# Patient Record
Sex: Female | Born: 1975 | Race: White | Hispanic: No | Smoking: Current every day smoker
Health system: Southern US, Community
[De-identification: ages and names within clinical notes are randomized; demographics above are authoritative.]

## PROBLEM LIST (undated history)

## (undated) DIAGNOSIS — T8859XA Other complications of anesthesia, initial encounter: Secondary | ICD-10-CM

## (undated) DIAGNOSIS — B9789 Other viral agents as the cause of diseases classified elsewhere: Secondary | ICD-10-CM

## (undated) DIAGNOSIS — E669 Obesity, unspecified: Secondary | ICD-10-CM

## (undated) DIAGNOSIS — E079 Disorder of thyroid, unspecified: Secondary | ICD-10-CM

## (undated) DIAGNOSIS — F419 Anxiety disorder, unspecified: Secondary | ICD-10-CM

## (undated) DIAGNOSIS — G35 Multiple sclerosis: Secondary | ICD-10-CM

## (undated) DIAGNOSIS — T4145XA Adverse effect of unspecified anesthetic, initial encounter: Secondary | ICD-10-CM

## (undated) DIAGNOSIS — K219 Gastro-esophageal reflux disease without esophagitis: Secondary | ICD-10-CM

## (undated) DIAGNOSIS — G0481 Other encephalitis and encephalomyelitis: Secondary | ICD-10-CM

## (undated) DIAGNOSIS — M797 Fibromyalgia: Secondary | ICD-10-CM

## (undated) DIAGNOSIS — Z87442 Personal history of urinary calculi: Secondary | ICD-10-CM

## (undated) DIAGNOSIS — K5903 Drug induced constipation: Secondary | ICD-10-CM

## (undated) DIAGNOSIS — S92901A Unspecified fracture of right foot, initial encounter for closed fracture: Secondary | ICD-10-CM

## (undated) DIAGNOSIS — G4733 Obstructive sleep apnea (adult) (pediatric): Secondary | ICD-10-CM

## (undated) DIAGNOSIS — J988 Other specified respiratory disorders: Secondary | ICD-10-CM

## (undated) DIAGNOSIS — T402X5A Adverse effect of other opioids, initial encounter: Secondary | ICD-10-CM

## (undated) DIAGNOSIS — M1990 Unspecified osteoarthritis, unspecified site: Secondary | ICD-10-CM

## (undated) DIAGNOSIS — M7918 Myalgia, other site: Secondary | ICD-10-CM

## (undated) DIAGNOSIS — G4730 Sleep apnea, unspecified: Secondary | ICD-10-CM

## (undated) DIAGNOSIS — I499 Cardiac arrhythmia, unspecified: Secondary | ICD-10-CM

## (undated) DIAGNOSIS — G894 Chronic pain syndrome: Secondary | ICD-10-CM

## (undated) DIAGNOSIS — M5481 Occipital neuralgia: Secondary | ICD-10-CM

## (undated) DIAGNOSIS — K259 Gastric ulcer, unspecified as acute or chronic, without hemorrhage or perforation: Secondary | ICD-10-CM

## (undated) HISTORY — DX: Chronic pain syndrome: G89.4

## (undated) HISTORY — PX: ENDOMETRIAL ABLATION: SHX621

## (undated) HISTORY — DX: Obstructive sleep apnea (adult) (pediatric): G47.33

## (undated) HISTORY — DX: Anxiety disorder, unspecified: F41.9

## (undated) HISTORY — DX: Sleep apnea, unspecified: G47.30

## (undated) HISTORY — DX: Myalgia, other site: M79.18

## (undated) HISTORY — PX: COLONOSCOPY: SHX174

## (undated) HISTORY — DX: Gastric ulcer, unspecified as acute or chronic, without hemorrhage or perforation: K25.9

## (undated) HISTORY — DX: Fibromyalgia: M79.7

## (undated) HISTORY — PX: TUBAL LIGATION: SHX77

## (undated) HISTORY — PX: NASAL SEPTUM SURGERY: SHX37

## (undated) HISTORY — DX: Occipital neuralgia: M54.81

## (undated) HISTORY — DX: Gastro-esophageal reflux disease without esophagitis: K21.9

## (undated) HISTORY — PX: WISDOM TOOTH EXTRACTION: SHX21

---

## 1998-01-17 ENCOUNTER — Other Ambulatory Visit: Admission: RE | Admit: 1998-01-17 | Discharge: 1998-01-17 | Payer: Self-pay | Admitting: Obstetrics

## 1999-06-23 ENCOUNTER — Encounter: Payer: Self-pay | Admitting: Orthopedic Surgery

## 1999-06-23 ENCOUNTER — Ambulatory Visit (HOSPITAL_COMMUNITY): Admission: RE | Admit: 1999-06-23 | Discharge: 1999-06-23 | Payer: Self-pay | Admitting: Orthopedic Surgery

## 2001-01-12 ENCOUNTER — Encounter: Payer: Self-pay | Admitting: Emergency Medicine

## 2001-01-12 ENCOUNTER — Emergency Department (HOSPITAL_COMMUNITY): Admission: EM | Admit: 2001-01-12 | Discharge: 2001-01-12 | Payer: Self-pay | Admitting: Emergency Medicine

## 2001-04-08 ENCOUNTER — Emergency Department (HOSPITAL_COMMUNITY): Admission: EM | Admit: 2001-04-08 | Discharge: 2001-04-08 | Payer: Self-pay | Admitting: Emergency Medicine

## 2002-06-05 ENCOUNTER — Encounter: Payer: Self-pay | Admitting: Emergency Medicine

## 2002-06-05 ENCOUNTER — Emergency Department (HOSPITAL_COMMUNITY): Admission: EM | Admit: 2002-06-05 | Discharge: 2002-06-05 | Payer: Self-pay | Admitting: Emergency Medicine

## 2003-08-09 ENCOUNTER — Emergency Department (HOSPITAL_COMMUNITY): Admission: EM | Admit: 2003-08-09 | Discharge: 2003-08-09 | Payer: Self-pay

## 2004-04-20 ENCOUNTER — Emergency Department (HOSPITAL_COMMUNITY): Admission: EM | Admit: 2004-04-20 | Discharge: 2004-04-20 | Payer: Self-pay | Admitting: Emergency Medicine

## 2004-04-21 ENCOUNTER — Emergency Department (HOSPITAL_COMMUNITY): Admission: EM | Admit: 2004-04-21 | Discharge: 2004-04-22 | Payer: Self-pay | Admitting: Emergency Medicine

## 2004-05-08 ENCOUNTER — Emergency Department (HOSPITAL_COMMUNITY): Admission: EM | Admit: 2004-05-08 | Discharge: 2004-05-08 | Payer: Self-pay | Admitting: Emergency Medicine

## 2004-05-11 ENCOUNTER — Emergency Department (HOSPITAL_COMMUNITY): Admission: EM | Admit: 2004-05-11 | Discharge: 2004-05-11 | Payer: Self-pay | Admitting: Emergency Medicine

## 2005-12-13 ENCOUNTER — Emergency Department (HOSPITAL_COMMUNITY): Admission: EM | Admit: 2005-12-13 | Discharge: 2005-12-13 | Payer: Self-pay | Admitting: Emergency Medicine

## 2006-05-04 ENCOUNTER — Emergency Department (HOSPITAL_COMMUNITY): Admission: EM | Admit: 2006-05-04 | Discharge: 2006-05-04 | Payer: Self-pay | Admitting: Emergency Medicine

## 2006-05-28 ENCOUNTER — Emergency Department (HOSPITAL_COMMUNITY): Admission: EM | Admit: 2006-05-28 | Discharge: 2006-05-28 | Payer: Self-pay | Admitting: Emergency Medicine

## 2007-01-13 ENCOUNTER — Emergency Department (HOSPITAL_COMMUNITY): Admission: EM | Admit: 2007-01-13 | Discharge: 2007-01-13 | Payer: Self-pay | Admitting: Emergency Medicine

## 2007-02-12 ENCOUNTER — Emergency Department (HOSPITAL_COMMUNITY): Admission: EM | Admit: 2007-02-12 | Discharge: 2007-02-12 | Payer: Self-pay | Admitting: Emergency Medicine

## 2007-08-30 ENCOUNTER — Emergency Department (HOSPITAL_COMMUNITY): Admission: EM | Admit: 2007-08-30 | Discharge: 2007-08-30 | Payer: Self-pay | Admitting: Emergency Medicine

## 2008-02-12 ENCOUNTER — Emergency Department (HOSPITAL_COMMUNITY): Admission: EM | Admit: 2008-02-12 | Discharge: 2008-02-12 | Payer: Self-pay | Admitting: Emergency Medicine

## 2008-06-24 ENCOUNTER — Emergency Department (HOSPITAL_COMMUNITY): Admission: EM | Admit: 2008-06-24 | Discharge: 2008-06-24 | Payer: Self-pay | Admitting: Emergency Medicine

## 2008-07-19 ENCOUNTER — Emergency Department (HOSPITAL_COMMUNITY): Admission: EM | Admit: 2008-07-19 | Discharge: 2008-07-19 | Payer: Self-pay | Admitting: Emergency Medicine

## 2008-12-13 ENCOUNTER — Emergency Department (HOSPITAL_COMMUNITY): Admission: EM | Admit: 2008-12-13 | Discharge: 2008-12-13 | Payer: Self-pay | Admitting: Emergency Medicine

## 2009-04-03 ENCOUNTER — Inpatient Hospital Stay (HOSPITAL_COMMUNITY): Admission: AD | Admit: 2009-04-03 | Discharge: 2009-04-03 | Payer: Self-pay | Admitting: Obstetrics & Gynecology

## 2009-04-13 ENCOUNTER — Inpatient Hospital Stay (HOSPITAL_COMMUNITY): Admission: AD | Admit: 2009-04-13 | Discharge: 2009-04-13 | Payer: Self-pay | Admitting: Obstetrics & Gynecology

## 2009-04-28 ENCOUNTER — Emergency Department (HOSPITAL_COMMUNITY): Admission: EM | Admit: 2009-04-28 | Discharge: 2009-04-28 | Payer: Self-pay | Admitting: Emergency Medicine

## 2009-05-20 ENCOUNTER — Other Ambulatory Visit: Payer: Self-pay | Admitting: Obstetrics and Gynecology

## 2009-05-21 ENCOUNTER — Other Ambulatory Visit: Payer: Self-pay | Admitting: Obstetrics and Gynecology

## 2009-05-21 ENCOUNTER — Observation Stay (HOSPITAL_COMMUNITY): Admission: EM | Admit: 2009-05-21 | Discharge: 2009-05-21 | Payer: Self-pay | Admitting: Emergency Medicine

## 2009-10-11 ENCOUNTER — Inpatient Hospital Stay (HOSPITAL_COMMUNITY): Admission: AD | Admit: 2009-10-11 | Discharge: 2009-10-11 | Payer: Self-pay | Admitting: Obstetrics & Gynecology

## 2009-10-11 ENCOUNTER — Ambulatory Visit: Payer: Self-pay | Admitting: Physician Assistant

## 2010-02-25 ENCOUNTER — Emergency Department (HOSPITAL_COMMUNITY)
Admission: EM | Admit: 2010-02-25 | Discharge: 2010-02-25 | Payer: Self-pay | Source: Home / Self Care | Admitting: Emergency Medicine

## 2010-05-04 ENCOUNTER — Emergency Department (HOSPITAL_COMMUNITY)
Admission: EM | Admit: 2010-05-04 | Discharge: 2010-05-04 | Payer: Self-pay | Source: Home / Self Care | Admitting: Emergency Medicine

## 2010-07-13 LAB — DIFFERENTIAL
Lymphocytes Relative: 23 % (ref 12–46)
Lymphs Abs: 1.7 10*3/uL (ref 0.7–4.0)
Monocytes Absolute: 0.8 10*3/uL (ref 0.1–1.0)
Monocytes Relative: 10 % (ref 3–12)
Neutro Abs: 4.5 10*3/uL (ref 1.7–7.7)
Neutrophils Relative %: 61 % (ref 43–77)

## 2010-07-13 LAB — URINALYSIS, ROUTINE W REFLEX MICROSCOPIC
Glucose, UA: NEGATIVE mg/dL
Hgb urine dipstick: NEGATIVE
Nitrite: NEGATIVE
pH: 7 (ref 5.0–8.0)

## 2010-07-13 LAB — POCT CARDIAC MARKERS
CKMB, poc: 2.5 ng/mL (ref 1.0–8.0)
Myoglobin, poc: 57 ng/mL (ref 12–200)
Myoglobin, poc: 70 ng/mL (ref 12–200)
Troponin i, poc: <0.05 ng/mL (ref 0.00–0.09)
Troponin i, poc: <0.05 ng/mL (ref 0.00–0.09)

## 2010-07-13 LAB — RAPID URINE DRUG SCREEN, HOSP PERFORMED
Amphetamines: NOT DETECTED
Cocaine: NOT DETECTED
Opiates: POSITIVE — AB
Tetrahydrocannabinol: NOT DETECTED

## 2010-07-13 LAB — POCT I-STAT, CHEM 8
BUN: 13 mg/dL (ref 6–23)
Chloride: 105 mEq/L (ref 96–112)
Creatinine, Ser: 0.8 mg/dL (ref 0.4–1.2)
Potassium: 4.1 mEq/L (ref 3.5–5.1)
Sodium: 140 mEq/L (ref 135–145)

## 2010-07-13 LAB — CBC
MCH: 27.1 pg (ref 26.0–34.0)
MCV: 83.8 fL (ref 78.0–100.0)
RBC: 4.58 MIL/uL (ref 3.87–5.11)

## 2010-07-13 LAB — POCT PREGNANCY, URINE: Preg Test, Ur: NEGATIVE

## 2010-07-13 LAB — URINE MICROSCOPIC-ADD ON

## 2010-07-19 LAB — BASIC METABOLIC PANEL
BUN: 3 mg/dL — ABNORMAL LOW (ref 6–23)
Calcium: 8.9 mg/dL (ref 8.4–10.5)
Chloride: 105 mEq/L (ref 96–112)
Creatinine, Ser: 0.51 mg/dL (ref 0.4–1.2)

## 2010-07-19 LAB — CBC
MCV: 87.5 fL (ref 78.0–100.0)
Platelets: 240 10*3/uL (ref 150–400)
RDW: 12.9 % (ref 11.5–15.5)

## 2010-07-19 LAB — DIFFERENTIAL
Eosinophils Absolute: 0 10*3/uL (ref 0.0–0.7)
Monocytes Relative: 4 % (ref 3–12)
Neutrophils Relative %: 90 % — ABNORMAL HIGH (ref 43–77)

## 2010-07-19 IMAGING — CR DG CHEST 2V
2 series · 2 of 2 positions shown · non-contrast
Comparison: 06/24/2008

CLINICAL DATA: Shortness of breath.

CHEST - 2 VIEW

[w chest pa]
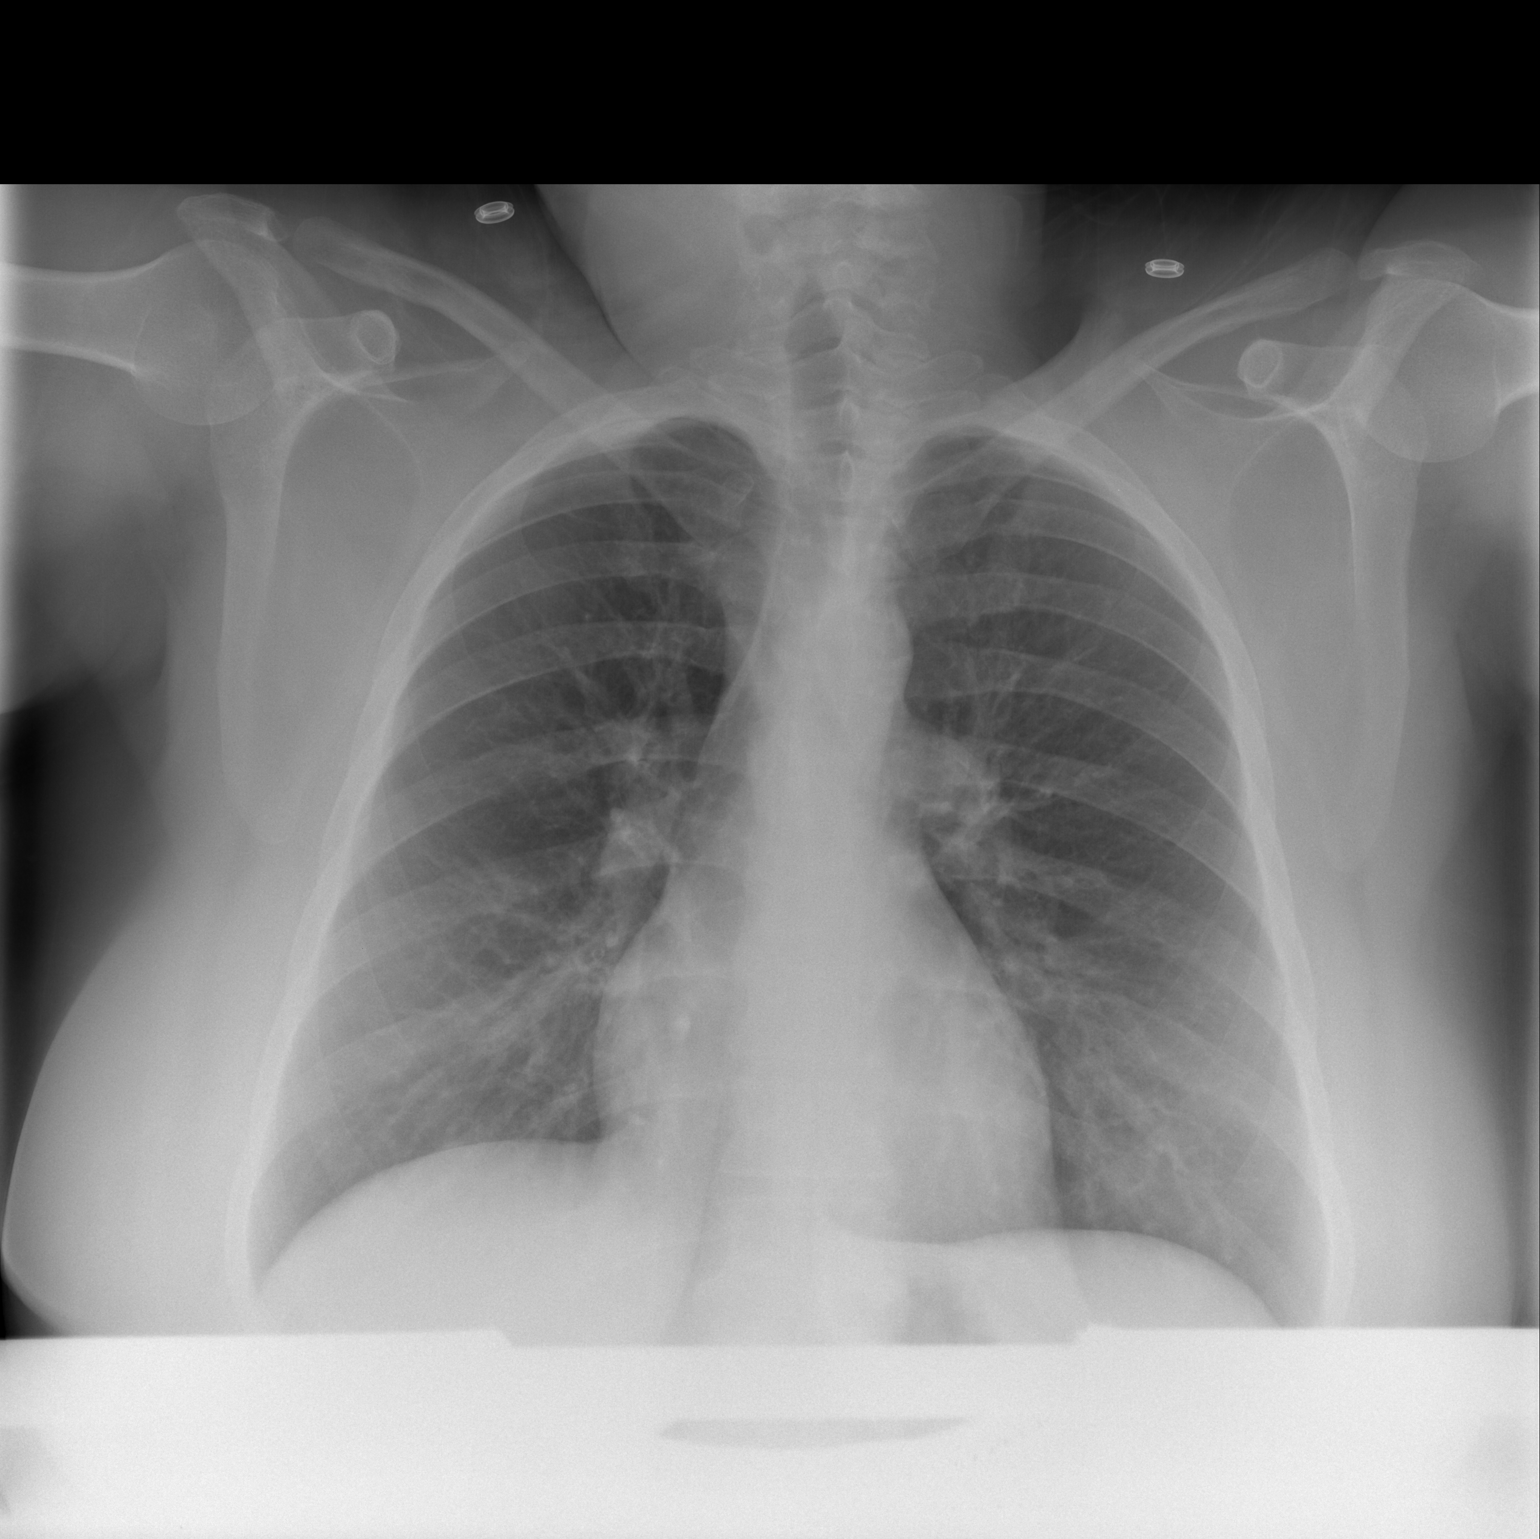

[w chest lat]
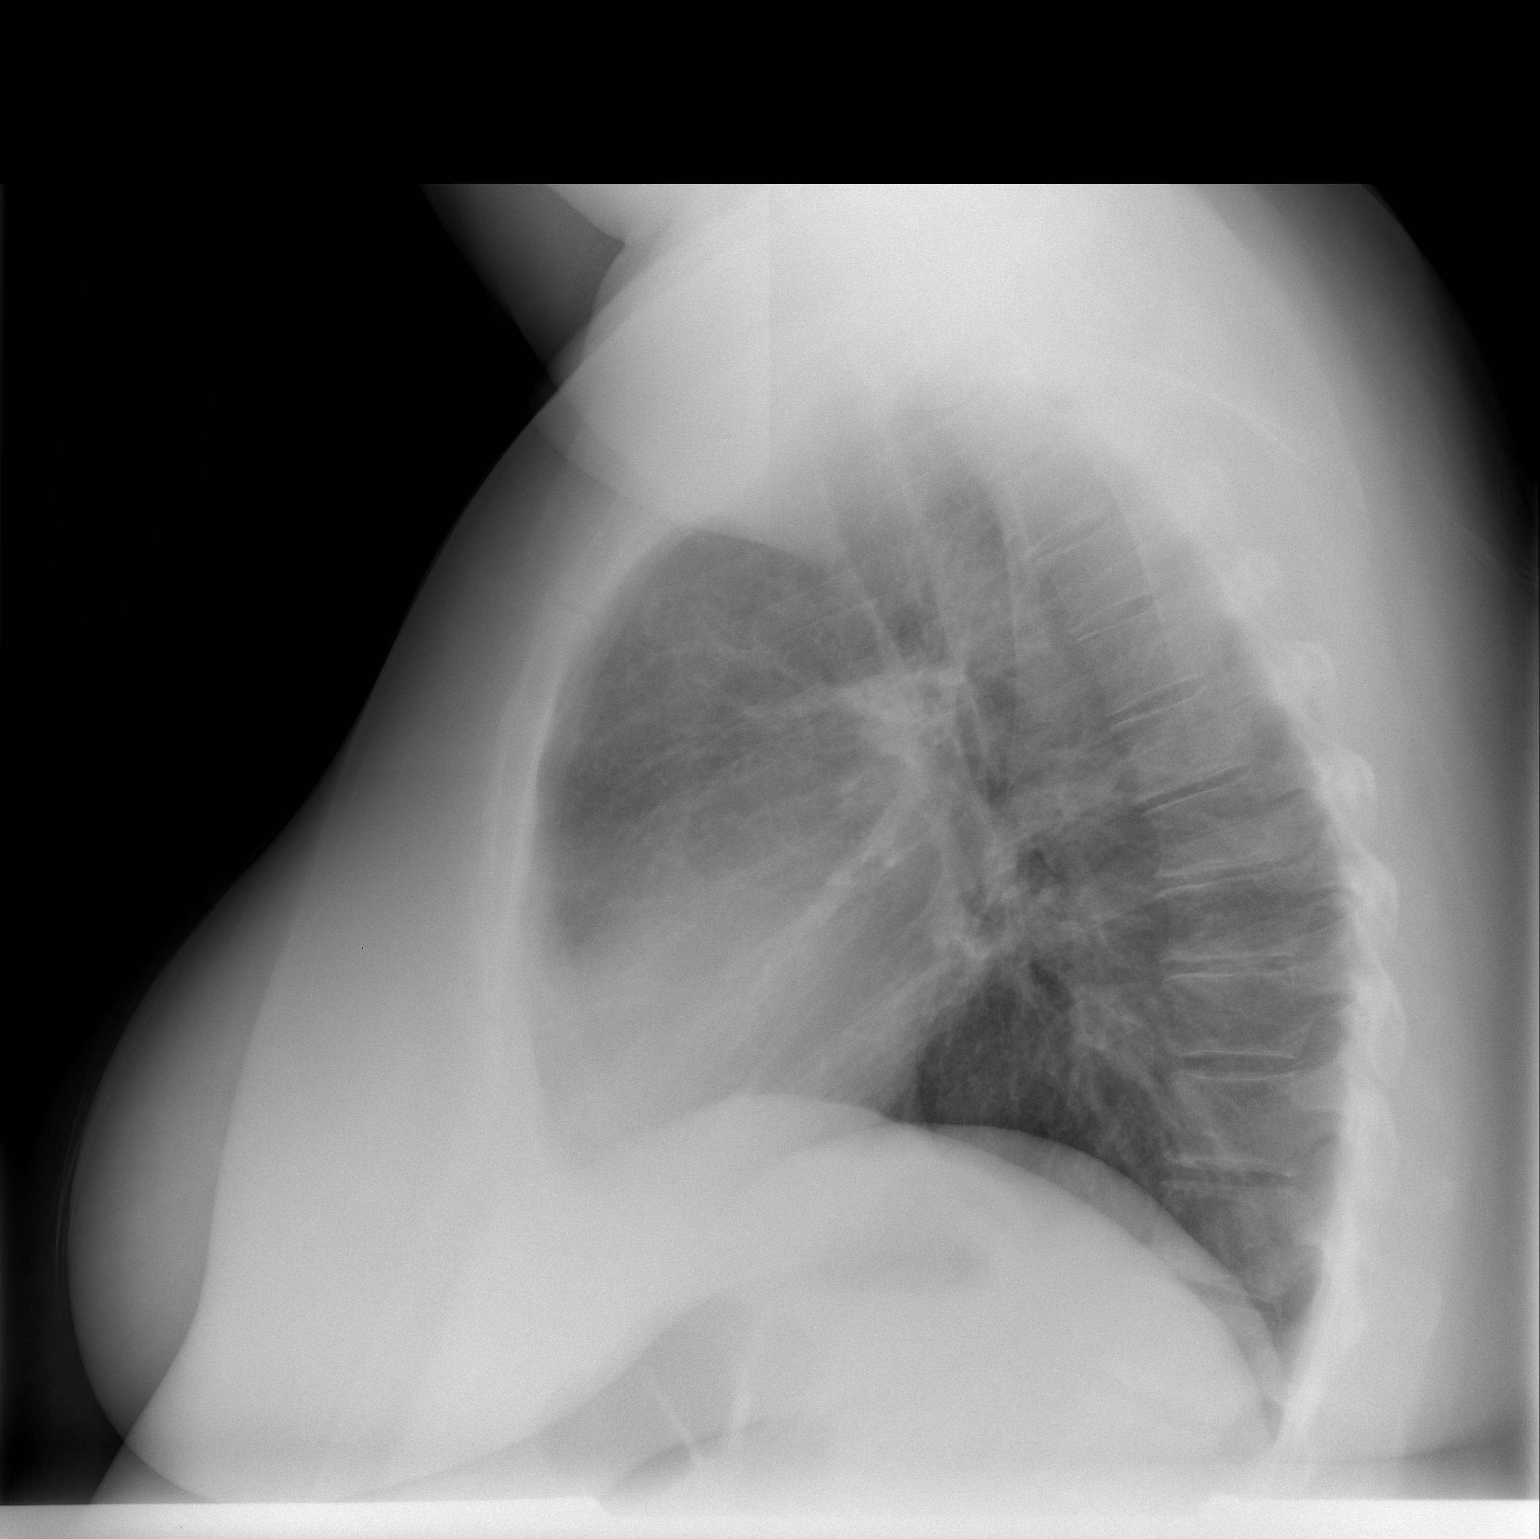

[2 of 2 positions shown; findings below may reference images not displayed]

FINDINGS: Heart and mediastinal contours are within normal limits.
No focal opacities or effusions.  No acute bony abnormality.
IMPRESSION: No acute findings.

## 2010-07-20 LAB — URINALYSIS, ROUTINE W REFLEX MICROSCOPIC
Bilirubin Urine: NEGATIVE
Hgb urine dipstick: NEGATIVE
Ketones, ur: >80 mg/dL — AB
Nitrite: NEGATIVE
Specific Gravity, Urine: >1.03 — ABNORMAL HIGH (ref 1.005–1.030)

## 2010-07-20 LAB — GC/CHLAMYDIA PROBE AMP, GENITAL
Chlamydia, DNA Probe: NEGATIVE
Chlamydia, DNA Probe: NEGATIVE
GC Probe Amp, Genital: NEGATIVE
GC Probe Amp, Genital: NEGATIVE

## 2010-07-20 LAB — WET PREP, GENITAL
Trich, Wet Prep: NONE SEEN
Yeast Wet Prep HPF POC: NONE SEEN

## 2010-07-20 LAB — CBC
Hemoglobin: 11.4 g/dL — ABNORMAL LOW (ref 12.0–15.0)
MCV: 87.9 fL (ref 78.0–100.0)
Platelets: 242 10*3/uL (ref 150–400)

## 2010-08-01 ENCOUNTER — Inpatient Hospital Stay (INDEPENDENT_AMBULATORY_CARE_PROVIDER_SITE_OTHER)
Admission: RE | Admit: 2010-08-01 | Discharge: 2010-08-01 | Disposition: A | Discharge status: Home / Self Care | Payer: Self-pay | Source: Ambulatory Visit | Attending: Family Medicine | Admitting: Family Medicine

## 2010-08-01 DIAGNOSIS — K089 Disorder of teeth and supporting structures, unspecified: Secondary | ICD-10-CM

## 2010-08-04 LAB — CBC
Hemoglobin: 13.6 g/dL (ref 12.0–15.0)
MCHC: 33.9 g/dL (ref 30.0–36.0)
Platelets: 346 10*3/uL (ref 150–400)
RBC: 4.51 MIL/uL (ref 3.87–5.11)
RDW: 13.5 % (ref 11.5–15.5)
WBC: 9.3 10*3/uL (ref 4.0–10.5)
WBC: 9.6 10*3/uL (ref 4.0–10.5)

## 2010-08-04 LAB — COMPREHENSIVE METABOLIC PANEL
ALT: 15 U/L (ref 0–35)
AST: 13 U/L (ref 0–37)
Alkaline Phosphatase: 62 U/L (ref 39–117)
CO2: 25 mEq/L (ref 19–32)
Calcium: 8.8 mg/dL (ref 8.4–10.5)
Chloride: 104 mEq/L (ref 96–112)
GFR calc Af Amer: >60 mL/min (ref >60)
GFR calc non Af Amer: >60 mL/min (ref >60)
Glucose, Bld: 89 mg/dL (ref 70–99)
Sodium: 135 mEq/L (ref 135–145)
Total Bilirubin: 0.4 mg/dL (ref 0.3–1.2)

## 2010-08-04 LAB — URINALYSIS, ROUTINE W REFLEX MICROSCOPIC
Bilirubin Urine: NEGATIVE
Hgb urine dipstick: NEGATIVE
Nitrite: NEGATIVE
Nitrite: NEGATIVE
Specific Gravity, Urine: 1.015 (ref 1.005–1.030)
Specific Gravity, Urine: 1.015 (ref 1.005–1.030)
Urobilinogen, UA: 0.2 mg/dL (ref 0.0–1.0)
pH: 7.5 (ref 5.0–8.0)

## 2010-08-04 LAB — GC/CHLAMYDIA PROBE AMP, GENITAL
Chlamydia, DNA Probe: NEGATIVE
GC Probe Amp, Genital: NEGATIVE

## 2010-08-04 LAB — WET PREP, GENITAL

## 2010-08-04 LAB — POCT PREGNANCY, URINE: Preg Test, Ur: POSITIVE

## 2010-08-04 LAB — HCG, QUANTITATIVE, PREGNANCY: hCG, Beta Chain, Quant, S: 4980 m[IU]/mL — ABNORMAL HIGH (ref <5)

## 2010-08-08 LAB — URINALYSIS, ROUTINE W REFLEX MICROSCOPIC
Bilirubin Urine: NEGATIVE
Ketones, ur: NEGATIVE mg/dL
Nitrite: NEGATIVE
Protein, ur: NEGATIVE mg/dL
Urobilinogen, UA: 0.2 mg/dL (ref 0.0–1.0)
pH: 6 (ref 5.0–8.0)

## 2010-08-08 LAB — URINE MICROSCOPIC-ADD ON

## 2010-08-13 LAB — CBC
HCT: 42.8 % (ref 36.0–46.0)
Hemoglobin: 14.6 g/dL (ref 12.0–15.0)
MCHC: 34.1 g/dL (ref 30.0–36.0)
MCV: 85.2 fL (ref 78.0–100.0)
RBC: 5.03 MIL/uL (ref 3.87–5.11)
WBC: 5.8 10*3/uL (ref 4.0–10.5)

## 2010-08-13 LAB — COMPREHENSIVE METABOLIC PANEL
AST: 20 U/L (ref 0–37)
CO2: 26 mEq/L (ref 19–32)
Calcium: 8.4 mg/dL (ref 8.4–10.5)
Chloride: 105 mEq/L (ref 96–112)
Creatinine, Ser: 0.64 mg/dL (ref 0.4–1.2)
GFR calc Af Amer: >60 mL/min (ref >60)
GFR calc non Af Amer: >60 mL/min (ref >60)
Glucose, Bld: 108 mg/dL — ABNORMAL HIGH (ref 70–99)
Total Bilirubin: 0.8 mg/dL (ref 0.3–1.2)

## 2010-08-13 LAB — PREGNANCY, URINE: Preg Test, Ur: NEGATIVE

## 2010-08-13 LAB — URINALYSIS, ROUTINE W REFLEX MICROSCOPIC
Bilirubin Urine: NEGATIVE
Hgb urine dipstick: NEGATIVE
Nitrite: NEGATIVE
Protein, ur: NEGATIVE mg/dL
Urobilinogen, UA: 1 mg/dL (ref 0.0–1.0)

## 2010-08-13 LAB — DIFFERENTIAL
Basophils Absolute: 0 10*3/uL (ref 0.0–0.1)
Eosinophils Absolute: 0.1 10*3/uL (ref 0.0–0.7)
Eosinophils Relative: 1 % (ref 0–5)
Lymphocytes Relative: 12 % (ref 12–46)
Neutrophils Relative %: 78 % — ABNORMAL HIGH (ref 43–77)

## 2010-08-13 LAB — LIPASE, BLOOD: Lipase: 21 U/L (ref 11–59)

## 2010-08-18 LAB — URINE CULTURE: Colony Count: 80000

## 2010-08-18 LAB — URINALYSIS, ROUTINE W REFLEX MICROSCOPIC
Bilirubin Urine: NEGATIVE
Glucose, UA: NEGATIVE mg/dL
Hgb urine dipstick: NEGATIVE
Ketones, ur: NEGATIVE mg/dL
Protein, ur: NEGATIVE mg/dL

## 2010-08-18 LAB — URINE MICROSCOPIC-ADD ON

## 2010-08-24 ENCOUNTER — Ambulatory Visit (HOSPITAL_COMMUNITY)
Admission: RE | Admit: 2010-08-24 | Discharge: 2010-08-24 | Disposition: A | Discharge status: Home / Self Care | Payer: Medicaid Other | Source: Ambulatory Visit | Attending: Family | Admitting: Family

## 2010-08-24 ENCOUNTER — Other Ambulatory Visit (HOSPITAL_COMMUNITY): Payer: Self-pay | Admitting: Family

## 2010-08-24 DIAGNOSIS — M549 Dorsalgia, unspecified: Secondary | ICD-10-CM

## 2010-08-24 DIAGNOSIS — M545 Low back pain, unspecified: Secondary | ICD-10-CM | POA: Insufficient documentation

## 2010-08-24 DIAGNOSIS — M546 Pain in thoracic spine: Secondary | ICD-10-CM | POA: Insufficient documentation

## 2010-08-27 ENCOUNTER — Emergency Department (HOSPITAL_COMMUNITY)
Admission: EM | Admit: 2010-08-27 | Discharge: 2010-08-28 | Disposition: A | Discharge status: Home / Self Care | Payer: Medicaid Other | Attending: Emergency Medicine | Admitting: Emergency Medicine

## 2010-08-27 DIAGNOSIS — G43909 Migraine, unspecified, not intractable, without status migrainosus: Secondary | ICD-10-CM | POA: Insufficient documentation

## 2010-09-16 ENCOUNTER — Ambulatory Visit: Payer: Medicaid Other | Attending: Sports Medicine | Admitting: Rehabilitation

## 2010-09-16 DIAGNOSIS — IMO0001 Reserved for inherently not codable concepts without codable children: Secondary | ICD-10-CM | POA: Insufficient documentation

## 2010-09-16 DIAGNOSIS — M545 Low back pain, unspecified: Secondary | ICD-10-CM | POA: Insufficient documentation

## 2010-09-16 DIAGNOSIS — M2569 Stiffness of other specified joint, not elsewhere classified: Secondary | ICD-10-CM | POA: Insufficient documentation

## 2010-09-23 ENCOUNTER — Ambulatory Visit: Payer: Medicaid Other | Admitting: Physical Therapy

## 2010-09-30 ENCOUNTER — Ambulatory Visit: Payer: Medicaid Other | Admitting: Physical Therapy

## 2011-02-11 LAB — URINE MICROSCOPIC-ADD ON

## 2011-02-11 LAB — URINALYSIS, ROUTINE W REFLEX MICROSCOPIC
Bilirubin Urine: NEGATIVE
Glucose, UA: NEGATIVE
Nitrite: NEGATIVE
Specific Gravity, Urine: 1.021
pH: 6

## 2011-02-11 LAB — POCT PREGNANCY, URINE: Preg Test, Ur: NEGATIVE

## 2011-02-11 LAB — I-STAT 8, (EC8 V) (CONVERTED LAB)
BUN: 7
Chloride: 107
HCT: 43
Operator id: 265201
pCO2, Ven: 39.6 — ABNORMAL LOW

## 2011-02-12 LAB — URINE MICROSCOPIC-ADD ON

## 2011-02-12 LAB — URINALYSIS, ROUTINE W REFLEX MICROSCOPIC
Bilirubin Urine: NEGATIVE
Ketones, ur: 15 — AB
Nitrite: NEGATIVE
Protein, ur: NEGATIVE
Specific Gravity, Urine: 1.031 — ABNORMAL HIGH
Urobilinogen, UA: 1

## 2011-02-12 LAB — POCT PREGNANCY, URINE
Operator id: 277751
Preg Test, Ur: NEGATIVE

## 2011-03-17 ENCOUNTER — Emergency Department (HOSPITAL_COMMUNITY)
Admission: EM | Admit: 2011-03-17 | Discharge: 2011-03-17 | Disposition: A | Discharge status: Home / Self Care | Payer: Medicaid Other | Source: Home / Self Care

## 2011-03-17 DIAGNOSIS — J45909 Unspecified asthma, uncomplicated: Secondary | ICD-10-CM

## 2011-03-17 DIAGNOSIS — J069 Acute upper respiratory infection, unspecified: Secondary | ICD-10-CM

## 2011-03-17 MED ORDER — PREDNISONE 10 MG PO TABS: ORAL_TABLET | ORAL | Status: AC

## 2011-03-17 MED ORDER — ALBUTEROL SULFATE (5 MG/ML) 0.5% IN NEBU
5.0000 mg | INHALATION_SOLUTION | Freq: Once | RESPIRATORY_TRACT | Status: AC
  Administered 2011-03-17: 5 mg via RESPIRATORY_TRACT

## 2011-03-17 MED ORDER — ALBUTEROL SULFATE (5 MG/ML) 0.5% IN NEBU
INHALATION_SOLUTION | RESPIRATORY_TRACT | Status: AC
  Filled 2011-03-17: qty 1

## 2011-03-17 MED ORDER — AZITHROMYCIN 250 MG PO TABS: ORAL_TABLET | ORAL | Status: DC

## 2011-03-17 MED ORDER — IPRATROPIUM BROMIDE 0.02 % IN SOLN
0.5000 mg | Freq: Once | RESPIRATORY_TRACT | Status: AC
  Administered 2011-03-17: 0.5 mg via RESPIRATORY_TRACT

## 2011-03-17 MED ORDER — PROMETHAZINE-CODEINE 6.25-10 MG/5ML PO SYRP: ORAL_SOLUTION | ORAL | Status: AC

## 2011-03-17 NOTE — ED Notes (Signed)
Chest tight, wheezing since last week , wheezing not controlled w home treatments; wheezing ascultated bilateral

## 2011-03-17 NOTE — Discharge Instructions (Signed)
Continue albuterol nebulizer treatments or inhaler every 3 hours. Also use ipratropium treatment four times a day until symptoms improve. Increase fluids and rest. Stop smoking. If your symptoms worsen go to the ER.

## 2011-03-17 NOTE — ED Provider Notes (Signed)
History     CSN: [id number] Arrival date & time: 03/17/2011  7:04 PM   None     Chief Complaint  Patient presents with  . Asthma    (Consider location/radiation/quality/duration/timing/severity/associated sxs/prior treatment) Patient is a 35 y.o. female presenting with cough. The history is provided by the patient.  Cough This is a new problem. The current episode started more than 2 days ago. The problem occurs every few minutes. The problem has been gradually worsening. The cough is productive of purulent sputum. There has been no fever. Associated symptoms include chills, rhinorrhea (clear), shortness of breath and wheezing. Pertinent negatives include no chest pain, no ear pain and no sore throat. Associated symptoms comments: Wheezing and dyspnea onset last night. Minimal or no  relief with home albuterol NMT treatments.. She is a smoker. Her past medical history is significant for asthma.    Past Medical History  Diagnosis Date  . Asthma     Past Surgical History  Procedure Date  . Tubal ligation     History reviewed. No pertinent family history.  History  Substance Use Topics  . Smoking status: Current Everyday Smoker  . Smokeless tobacco: Not on file  . Alcohol Use: No    OB History    Grav Para Term Preterm Abortions TAB SAB Ect Mult Living                  Review of Systems  Constitutional: Positive for chills and fatigue. Negative for fever.  HENT: Positive for rhinorrhea (clear). Negative for ear pain, sore throat, postnasal drip and sinus pressure.   Respiratory: Positive for cough, chest tightness, shortness of breath and wheezing.   Cardiovascular: Negative for chest pain and palpitations.    Allergies  Review of patient's allergies indicates no known allergies.  Home Medications   Current Outpatient Rx  Name Route Sig Dispense Refill  . ALBUTEROL SULFATE HFA 108 (90 BASE) MCG/ACT IN AERS Inhalation Inhale 2 puffs into the lungs every 6 (six)  hours as needed.        BP 109/73  Pulse 74  Temp(Src) 97.9 F (36.6 C) (Oral)  Resp 20  SpO2 98%  Physical Exam  Nursing note and vitals reviewed. Constitutional: She appears well-developed and well-nourished. No distress.  HENT:  Head: Normocephalic and atraumatic.  Right Ear: Tympanic membrane, external ear and ear canal normal.  Left Ear: Tympanic membrane, external ear and ear canal normal.  Nose: Nose normal.  Mouth/Throat: Uvula is midline, oropharynx is clear and moist and mucous membranes are normal. No oropharyngeal exudate, posterior oropharyngeal edema or posterior oropharyngeal erythema.  Neck: Neck supple.  Cardiovascular: Normal rate, regular rhythm and normal heart sounds.   Pulmonary/Chest: Effort normal. No respiratory distress. She has wheezes. She has no rhonchi. She has no rales.  Lymphadenopathy:    She has no cervical adenopathy.  Neurological: She is alert.  Skin: Skin is warm and dry.  Psychiatric: She has a normal mood and affect.    ED Course  Procedures (including critical care time)  Labs Reviewed - No data to display No results found.   No diagnosis found.    MDM   Symptomatic improvement and improved breath sounds with NMT. Pulse ox improved also from 98% to 100%.        Melody Comas, [place] 03/18/11 1131

## 2011-03-18 NOTE — ED Provider Notes (Signed)
Medical screening examination/treatment/procedure(s) were performed by non-physician practitioner and as supervising physician I was immediately available for consultation/collaboration.   MORENO-COLL,Clarabelle Oscarson; MD   Yoav Okane Moreno-Coll, MD 03/18/11 1441 

## 2011-09-09 ENCOUNTER — Emergency Department (HOSPITAL_COMMUNITY)
Admission: EM | Admit: 2011-09-09 | Discharge: 2011-09-10 | Disposition: A | Discharge status: Home / Self Care | Payer: Medicaid Other | Attending: Emergency Medicine | Admitting: Emergency Medicine

## 2011-09-09 ENCOUNTER — Encounter (HOSPITAL_COMMUNITY): Payer: Self-pay | Admitting: Emergency Medicine

## 2011-09-09 DIAGNOSIS — R002 Palpitations: Secondary | ICD-10-CM | POA: Insufficient documentation

## 2011-09-09 DIAGNOSIS — F17200 Nicotine dependence, unspecified, uncomplicated: Secondary | ICD-10-CM | POA: Insufficient documentation

## 2011-09-09 DIAGNOSIS — J45909 Unspecified asthma, uncomplicated: Secondary | ICD-10-CM | POA: Insufficient documentation

## 2011-09-09 DIAGNOSIS — R42 Dizziness and giddiness: Secondary | ICD-10-CM | POA: Insufficient documentation

## 2011-09-09 HISTORY — DX: Obesity, unspecified: E66.9

## 2011-09-09 MED ORDER — SODIUM CHLORIDE 0.9 % IV BOLUS (SEPSIS)
1000.0000 mL | Freq: Once | INTRAVENOUS | Status: AC
  Administered 2011-09-10: 1000 mL via INTRAVENOUS

## 2011-09-09 NOTE — ED Notes (Signed)
PT. REPORTS PALPITATIONS WITH LEFT ARM PAIN , LIGHTHEADED AND DIZZINESS ONSET TODAY , STATES BP= 153/ 98 THIS AFTERNOON .

## 2011-09-10 ENCOUNTER — Emergency Department (HOSPITAL_COMMUNITY): Payer: Medicaid Other

## 2011-09-10 LAB — URINALYSIS, ROUTINE W REFLEX MICROSCOPIC
Bilirubin Urine: NEGATIVE
Glucose, UA: NEGATIVE mg/dL
Hgb urine dipstick: NEGATIVE
Ketones, ur: NEGATIVE mg/dL
Leukocytes, UA: NEGATIVE
Nitrite: NEGATIVE
Protein, ur: NEGATIVE mg/dL
Specific Gravity, Urine: 1.023 (ref 1.005–1.030)
Urobilinogen, UA: 0.2 mg/dL (ref 0.0–1.0)
pH: 6 (ref 5.0–8.0)

## 2011-09-10 LAB — POCT I-STAT, CHEM 8
HCT: 40 % (ref 36.0–46.0)
Hemoglobin: 13.6 g/dL (ref 12.0–15.0)
Potassium: 3.9 mEq/L (ref 3.5–5.1)
Sodium: 140 mEq/L (ref 135–145)
TCO2: 28 mmol/L (ref 0–100)

## 2011-09-10 LAB — PREGNANCY, URINE: Preg Test, Ur: NEGATIVE

## 2011-09-10 MED ORDER — KETOROLAC TROMETHAMINE 30 MG/ML IJ SOLN
30.0000 mg | Freq: Once | INTRAMUSCULAR | Status: AC
  Administered 2011-09-10: 30 mg via INTRAVENOUS
  Filled 2011-09-10: qty 1

## 2011-09-10 MED ORDER — ALBUTEROL SULFATE HFA 108 (90 BASE) MCG/ACT IN AERS
2.0000 | INHALATION_SPRAY | Freq: Four times a day (QID) | RESPIRATORY_TRACT | Status: DC
  Administered 2011-09-10: 2 via RESPIRATORY_TRACT
  Filled 2011-09-10: qty 6.7

## 2011-09-10 MED ORDER — PROMETHAZINE HCL 25 MG/ML IJ SOLN
12.5000 mg | Freq: Once | INTRAMUSCULAR | Status: AC
  Administered 2011-09-10: 12.5 mg via INTRAVENOUS
  Filled 2011-09-10: qty 1

## 2011-09-10 NOTE — ED Notes (Signed)
Pt discharged home in stable condition with family. Discharge instructions reviewed with pt, pt has no questions at this time.

## 2011-09-10 NOTE — Discharge Instructions (Signed)
Avoid caffeine. Followup with your Dr. for recheck in 3-5 days. Return to emergency room for worsening condition or new concerning symptoms.  Palpitations  A palpitation is the feeling that your heartbeat is irregular or is faster than normal. Although this is frightening, it usually is not serious. Palpitations may be caused by excesses of smoking, caffeine, or alcohol. They are also brought on by stress and anxiety. Sometimes, they are caused by heart disease. Unless otherwise noted, your caregiver did not find any signs of serious illness at this time. HOME CARE INSTRUCTIONS  To help prevent palpitations:  Drink decaffeinated coffee, tea, and soda pop. Avoid chocolate.   If you smoke or drink alcohol, quit or cut down as much as possible.   Reduce your stress or anxiety level. Biofeedback, yoga, or meditation will help you relax. Physical activity such as swimming, jogging, or walking also may be helpful.  SEEK MEDICAL CARE IF:   You continue to have a fast heartbeat.   Your palpitations occur more often.  SEEK IMMEDIATE MEDICAL CARE IF: You develop chest pain, shortness of breath, severe headache, dizziness, or fainting. Document Released: 04/16/2000 Document Revised: 04/08/2011 Document Reviewed: 06/16/2007 [facility] Patient Information 2012 [place], [place].

## 2011-09-10 NOTE — ED Provider Notes (Signed)
History     CSN: [id number]  Arrival date & time 09/09/11  2200   First MD Initiated Contact with Patient 09/09/11 2214      Chief Complaint  Patient presents with  . Palpitations    (Consider location/radiation/quality/duration/timing/severity/associated sxs/prior treatment) HPI Patient presents emergency murmur with palpitations and dizziness that started today.  She said she worked third shift last night.  States she has chronic pain and that has been causing muscular pain today.  Patient denies shortness of breath, nausea, vomiting, diarrhea, abdominal pain, visual changes, chest pain cough or fever.  Patient states that she did not try anything for her discomfort other than her normal medications.  Patient states that she has asthma and has been wheezing some as well.  Past Medical History  Diagnosis Date  . Asthma   . Migraine   . Obesity     Past Surgical History  Procedure Date  . Tubal ligation     No family history on file.  History  Substance Use Topics  . Smoking status: Current Everyday Smoker  . Smokeless tobacco: Not on file  . Alcohol Use: No    OB History    Grav Para Term Preterm Abortions TAB SAB Ect Mult Living                  Review of Systems All other systems negative except as documented in the HPI. All pertinent positives and negatives as reviewed in the HPI.  Allergies  Review of patient's allergies indicates no known allergies.  Home Medications   Current Outpatient Rx  Name Route Sig Dispense Refill  . ALBUTEROL SULFATE HFA 108 (90 BASE) MCG/ACT IN AERS Inhalation Inhale 2 puffs into the lungs every 6 (six) hours as needed. For shortness of breath.    Marland Kitchen GABAPENTIN 300 MG PO CAPS Oral Take 300-600 mg by mouth 3 (three) times daily. Take 300 MG in the morning, 300 MG in the afternoon, and 600 MG at bedtime.    . MOMETASONE FURO-FORMOTEROL FUM 100-5 MCG/ACT IN AERO Inhalation Inhale 2 puffs into the lungs daily.    .  OXYCODONE-ACETAMINOPHEN 10-325 MG PO TABS Oral Take 1 tablet by mouth 2 (two) times daily as needed. For pain.      BP 111/55  Pulse 75  Temp(Src) 98.3 F (36.8 C) (Oral)  Resp 17  SpO2 99%  LMP 08/30/2011  Physical Exam Physical Examination: General appearance - alert, well appearing, and in no distress, oriented to person, place, and time and overweight Mental status - alert, oriented to person, place, and time, normal mood, behavior, speech, dress, motor activity, and thought processes Eyes - pupils equal and reactive, extraocular eye movements intact Ears - bilateral TM's and external ear canals normal Nose - normal and patent, no erythema, discharge or polyps Mouth - mucous membranes moist, pharynx normal without lesions Chest - clear to auscultation, no wheezes, rales or rhonchi, symmetric air entry, no tachypnea, retractions or cyanosis Heart - normal rate, regular rhythm, normal S1, S2, no murmurs, rubs, clicks or gallops Abdomen - soft, nontender, nondistended, no masses or organomegaly  ED Course  Procedures (including critical care time)  Labs Reviewed  POCT I-STAT, CHEM 8 - Abnormal; Notable for the following:    Glucose, Bld 102 (*)    All other components within normal limits  PREGNANCY, URINE  URINALYSIS, ROUTINE W REFLEX MICROSCOPIC    The patient is seen ambulating in the department without difficulty.  Patient was seen hyperventilating  by the nurse on arrival.  I feel the patient's symptoms are multifactorial based on her chronic pain and anxiety.  She states she checked her blood pressure multiple times during the day. The patient was concerned that it was slightly higher than normal.She felt like her heart was pounding. No CP, or SOB. The patient has no abnormalities when I reviewed. She is given fluids and medication for her headache. MDM  MDM Reviewed: nursing note and vitals Interpretation: labs, ECG and x-ray    Date: 09/10/2011  Rate: 83  Rhythm:  normal sinus rhythm  QRS Axis: normal  Intervals: normal  ST/T Wave abnormalities: normal  Conduction Disutrbances:none  Narrative Interpretation:   Old EKG Reviewed: unchanged          Carlyle Dolly, PA-C 09/10/11 0147

## 2011-09-12 NOTE — ED Provider Notes (Signed)
Medical screening examination/treatment/procedure(s) were performed by non-physician practitioner and as supervising physician I was immediately available for consultation/collaboration.   Benny Lennert, MD 09/12/11 [phone]

## 2012-04-02 ENCOUNTER — Emergency Department (HOSPITAL_COMMUNITY)
Admission: EM | Admit: 2012-04-02 | Discharge: 2012-04-02 | Disposition: A | Discharge status: Home / Self Care | Payer: Medicaid Other | Attending: Emergency Medicine | Admitting: Emergency Medicine

## 2012-04-02 ENCOUNTER — Encounter (HOSPITAL_COMMUNITY): Payer: Self-pay | Admitting: Emergency Medicine

## 2012-04-02 ENCOUNTER — Emergency Department (HOSPITAL_COMMUNITY): Payer: Medicaid Other

## 2012-04-02 DIAGNOSIS — J45909 Unspecified asthma, uncomplicated: Secondary | ICD-10-CM

## 2012-04-02 DIAGNOSIS — E669 Obesity, unspecified: Secondary | ICD-10-CM | POA: Insufficient documentation

## 2012-04-02 DIAGNOSIS — J45901 Unspecified asthma with (acute) exacerbation: Secondary | ICD-10-CM | POA: Insufficient documentation

## 2012-04-02 DIAGNOSIS — R059 Cough, unspecified: Secondary | ICD-10-CM | POA: Insufficient documentation

## 2012-04-02 DIAGNOSIS — H539 Unspecified visual disturbance: Secondary | ICD-10-CM | POA: Insufficient documentation

## 2012-04-02 DIAGNOSIS — R110 Nausea: Secondary | ICD-10-CM | POA: Insufficient documentation

## 2012-04-02 DIAGNOSIS — Z79899 Other long term (current) drug therapy: Secondary | ICD-10-CM | POA: Insufficient documentation

## 2012-04-02 DIAGNOSIS — G43909 Migraine, unspecified, not intractable, without status migrainosus: Secondary | ICD-10-CM | POA: Insufficient documentation

## 2012-04-02 DIAGNOSIS — R0602 Shortness of breath: Secondary | ICD-10-CM | POA: Insufficient documentation

## 2012-04-02 DIAGNOSIS — R05 Cough: Secondary | ICD-10-CM | POA: Insufficient documentation

## 2012-04-02 DIAGNOSIS — F17200 Nicotine dependence, unspecified, uncomplicated: Secondary | ICD-10-CM | POA: Insufficient documentation

## 2012-04-02 DIAGNOSIS — H53149 Visual discomfort, unspecified: Secondary | ICD-10-CM | POA: Insufficient documentation

## 2012-04-02 LAB — CBC WITH DIFFERENTIAL/PLATELET
HCT: 41.3 % (ref 36.0–46.0)
Hemoglobin: 13.7 g/dL (ref 12.0–15.0)
Lymphocytes Relative: 26 % (ref 12–46)
Monocytes Absolute: 0.6 10*3/uL (ref 0.1–1.0)
Monocytes Relative: 6 % (ref 3–12)
Neutro Abs: 6.9 10*3/uL (ref 1.7–7.7)
RBC: 5.06 MIL/uL (ref 3.87–5.11)
WBC: 10.6 10*3/uL — ABNORMAL HIGH (ref 4.0–10.5)

## 2012-04-02 LAB — COMPREHENSIVE METABOLIC PANEL
AST: 12 U/L (ref 0–37)
Alkaline Phosphatase: 102 U/L (ref 39–117)
BUN: 11 mg/dL (ref 6–23)
CO2: 26 mEq/L (ref 19–32)
Chloride: 99 mEq/L (ref 96–112)
Creatinine, Ser: 0.66 mg/dL (ref 0.50–1.10)
GFR calc non Af Amer: >90 mL/min (ref >90)
Total Bilirubin: 0.2 mg/dL — ABNORMAL LOW (ref 0.3–1.2)

## 2012-04-02 MED ORDER — OXYCODONE-ACETAMINOPHEN 5-325 MG PO TABS: 2.0000 | ORAL_TABLET | Freq: Once | ORAL | Status: DC

## 2012-04-02 MED ORDER — HYDROCODONE-ACETAMINOPHEN 5-325 MG PO TABS: 1.0000 | ORAL_TABLET | Freq: Four times a day (QID) | ORAL | Status: DC | PRN

## 2012-04-02 MED ORDER — AZITHROMYCIN 250 MG PO TABS: ORAL_TABLET | ORAL | Status: DC

## 2012-04-02 MED ORDER — ONDANSETRON 4 MG PO TBDP
8.0000 mg | ORAL_TABLET | Freq: Once | ORAL | Status: AC
  Administered 2012-04-02: 8 mg via ORAL
  Filled 2012-04-02: qty 2

## 2012-04-02 MED ORDER — PREDNISONE 20 MG PO TABS: 40.0000 mg | ORAL_TABLET | Freq: Every day | ORAL | Status: DC

## 2012-04-02 MED ORDER — METOCLOPRAMIDE HCL 5 MG/ML IJ SOLN
10.0000 mg | Freq: Once | INTRAMUSCULAR | Status: AC
  Administered 2012-04-02: 10 mg via INTRAVENOUS
  Filled 2012-04-02: qty 2

## 2012-04-02 MED ORDER — METHYLPREDNISOLONE SODIUM SUCC 125 MG IJ SOLR
125.0000 mg | Freq: Once | INTRAMUSCULAR | Status: AC
  Administered 2012-04-02: 125 mg via INTRAVENOUS
  Filled 2012-04-02: qty 2

## 2012-04-02 MED ORDER — ALBUTEROL (5 MG/ML) CONTINUOUS INHALATION SOLN: 10.0000 mg/h | INHALATION_SOLUTION | RESPIRATORY_TRACT | Status: DC

## 2012-04-02 MED ORDER — KETOROLAC TROMETHAMINE 30 MG/ML IJ SOLN
30.0000 mg | Freq: Once | INTRAMUSCULAR | Status: AC
  Administered 2012-04-02: 30 mg via INTRAVENOUS
  Filled 2012-04-02: qty 1

## 2012-04-02 MED ORDER — DIPHENHYDRAMINE HCL 50 MG/ML IJ SOLN
25.0000 mg | Freq: Once | INTRAMUSCULAR | Status: AC
  Administered 2012-04-02: 25 mg via INTRAVENOUS
  Filled 2012-04-02: qty 1

## 2012-04-02 MED ORDER — ALBUTEROL SULFATE (5 MG/ML) 0.5% IN NEBU
2.5000 mg | INHALATION_SOLUTION | Freq: Once | RESPIRATORY_TRACT | Status: AC
  Administered 2012-04-02: 2.5 mg via RESPIRATORY_TRACT
  Filled 2012-04-02: qty 0.5

## 2012-04-02 NOTE — ED Notes (Signed)
Patient states she has been having to use her albuterol neb more often in the last few days.  Tonight she started with a headache right between her eyes, stated her eye sight was blurry.

## 2012-04-02 NOTE — ED Provider Notes (Signed)
History     CSN: [id number]  Arrival date & time 04/02/12  4098   First MD Initiated Contact with Patient 04/02/12 0350      Chief Complaint  Patient presents with  . Headache  . Shortness of Breath    (Consider location/radiation/quality/duration/timing/severity/associated sxs/prior treatment) HPI Comments: Patient presents with complaint of migraine. Patient states that the migraine began around 2pm. She describes the pain as frontal, 10/10, with associated nausea and photophobia. She states that this migraine is like her other migraines. She also complains of non productive cough and wheezing for 1 week. Patient has tried at home neb and rescue inhaler more often. Patient is followed by Pulmonology Dr. Leeroy Bock but has not seen him in sometime. Denies fever or chills. Denies neck stiffness.   The history is provided by the patient. No language interpreter was used.    Past Medical History  Diagnosis Date  . Asthma   . Migraine   . Obesity     Past Surgical History  Procedure Date  . Tubal ligation     No family history on file.  History  Substance Use Topics  . Smoking status: Current Every Day Smoker -- 4.0 packs/day  . Smokeless tobacco: Not on file  . Alcohol Use: No    OB History    Grav Para Term Preterm Abortions TAB SAB Ect Mult Living                  Review of Systems  Constitutional: Negative for chills.  HENT: Positive for neck stiffness.   Eyes: Positive for visual disturbance.  Gastrointestinal: Positive for nausea. Negative for vomiting.  Neurological: Positive for headaches.    Allergies  Review of patient's allergies indicates no known allergies.  Home Medications   Current Outpatient Rx  Name  Route  Sig  Dispense  Refill  . ALBUTEROL SULFATE HFA 108 (90 BASE) MCG/ACT IN AERS   Inhalation   Inhale 2 puffs into the lungs every 6 (six) hours as needed. For shortness of breath.         . ALBUTEROL SULFATE (5 MG/ML) 0.5% IN NEBU  Nebulization   Take 2.5 mg by nebulization every 6 (six) hours as needed. Shortness of breath         . MOMETASONE FURO-FORMOTEROL FUM 100-5 MCG/ACT IN AERO   Inhalation   Inhale 2 puffs into the lungs daily.           BP 139/76  Pulse 82  Temp 97.9 F (36.6 C) (Oral)  Resp 22  SpO2 99%  LMP 03/27/2012  Physical Exam  Nursing note and vitals reviewed. Constitutional: She is oriented to person, place, and time. She appears well-developed and well-nourished.  HENT:  Head: Normocephalic and atraumatic.  Mouth/Throat: Oropharynx is clear and moist.  Eyes: Conjunctivae normal and EOM are normal. No scleral icterus.  Neck: Normal range of motion. Neck supple.       No meningeal signs.  Cardiovascular: Normal rate, regular rhythm and normal heart sounds.   Pulmonary/Chest: Effort normal and breath sounds normal.  Abdominal: Soft. Bowel sounds are normal. There is no tenderness.  Neurological: She is alert and oriented to person, place, and time. No cranial nerve deficit. She exhibits normal muscle tone. Coordination normal.       Cranial nerves II-XII grossly intact. No pronator drift. Negative romberg.  Skin: Skin is warm and dry.    ED Course  Procedures (including critical care time)  Labs Reviewed  CBC WITH DIFFERENTIAL - Abnormal; Notable for the following:    WBC 10.6 (*)     Platelets 411 (*)     All other components within normal limits  COMPREHENSIVE METABOLIC PANEL   Results for orders placed during the hospital encounter of 04/02/12  CBC WITH DIFFERENTIAL      Component Value Range   WBC 10.6 (*) 4.0 - 10.5 K/uL   RBC 5.06  3.87 - 5.11 MIL/uL   Hemoglobin 13.7  12.0 - 15.0 g/dL   HCT 16.1  09.6 - 04.5 %   MCV 81.6  78.0 - 100.0 fL   MCH 27.1  26.0 - 34.0 pg   MCHC 33.2  30.0 - 36.0 g/dL   RDW 40.9  81.1 - 91.4 %   Platelets 411 (*) 150 - 400 K/uL   Neutrophils Relative 65  43 - 77 %   Neutro Abs 6.9  1.7 - 7.7 K/uL   Lymphocytes Relative 26  12 - 46 %    Lymphs Abs 2.8  0.7 - 4.0 K/uL   Monocytes Relative 6  3 - 12 %   Monocytes Absolute 0.6  0.1 - 1.0 K/uL   Eosinophils Relative 3  0 - 5 %   Eosinophils Absolute 0.4  0.0 - 0.7 K/uL   Basophils Relative 0  0 - 1 %   Basophils Absolute 0.0  0.0 - 0.1 K/uL  COMPREHENSIVE METABOLIC PANEL      Component Value Range   Sodium 136  135 - 145 mEq/L   Potassium 3.8  3.5 - 5.1 mEq/L   Chloride 99  96 - 112 mEq/L   CO2 26  19 - 32 mEq/L   Glucose, Bld 97  70 - 99 mg/dL   BUN 11  6 - 23 mg/dL   Creatinine, Ser 7.82  0.50 - 1.10 mg/dL   Calcium 9.3  8.4 - 95.6 mg/dL   Total Protein 7.5  6.0 - 8.3 g/dL   Albumin 3.6  3.5 - 5.2 g/dL   AST 12  0 - 37 U/L   ALT 12  0 - 35 U/L   Alkaline Phosphatase 102  39 - 117 U/L   Total Bilirubin 0.2 (*) 0.3 - 1.2 mg/dL   GFR calc non Af Amer >90  >90 mL/min   GFR calc Af Amer >90  >90 mL/min    No results found.   1. Asthma   2. Migraine       MDM  Patient presents with complaint of migraine and asthma. Given migraine cocktail, breathing treatment, and solumedrol with improvement. Labs and CXR: unremarkable, no signs of PNA. Symptoms and presentation seem consistent with asthmatic bronchitis. Discharged with short course of steroids, Z-pak, and instructions to follow-up with Pulmonology in 2 days. Return precautions given.        Pixie Casino, PA-C 04/02/12 [phone]

## 2012-04-02 NOTE — ED Notes (Addendum)
Pt. States hx of asthma. States she has had to use nebulizer more often lately and on way home tonight she felt like she was SOB and her chest was tight. Has non-productive cough. States she also has a migraine. Vision is blurry and nauseated. Denies fever. Breath sounds + for expiratory wheezes bilaterally.

## 2012-04-03 NOTE — ED Provider Notes (Signed)
Medical screening examination/treatment/procedure(s) were performed by non-physician practitioner and as supervising physician I was immediately available for consultation/collaboration.  Hurman Horn, MD 04/03/12 [phone]

## 2012-05-23 ENCOUNTER — Emergency Department (HOSPITAL_COMMUNITY)
Admission: EM | Admit: 2012-05-23 | Discharge: 2012-05-24 | Disposition: A | Discharge status: Home / Self Care | Payer: Medicaid Other | Attending: Emergency Medicine | Admitting: Emergency Medicine

## 2012-05-23 DIAGNOSIS — T43205A Adverse effect of unspecified antidepressants, initial encounter: Secondary | ICD-10-CM | POA: Insufficient documentation

## 2012-05-23 DIAGNOSIS — T465X5A Adverse effect of other antihypertensive drugs, initial encounter: Secondary | ICD-10-CM | POA: Insufficient documentation

## 2012-05-23 DIAGNOSIS — Z79899 Other long term (current) drug therapy: Secondary | ICD-10-CM | POA: Insufficient documentation

## 2012-05-23 DIAGNOSIS — F17200 Nicotine dependence, unspecified, uncomplicated: Secondary | ICD-10-CM | POA: Insufficient documentation

## 2012-05-23 DIAGNOSIS — J45901 Unspecified asthma with (acute) exacerbation: Secondary | ICD-10-CM | POA: Insufficient documentation

## 2012-05-23 DIAGNOSIS — F3289 Other specified depressive episodes: Secondary | ICD-10-CM | POA: Insufficient documentation

## 2012-05-23 DIAGNOSIS — T7840XA Allergy, unspecified, initial encounter: Secondary | ICD-10-CM

## 2012-05-23 DIAGNOSIS — Z862 Personal history of diseases of the blood and blood-forming organs and certain disorders involving the immune mechanism: Secondary | ICD-10-CM | POA: Insufficient documentation

## 2012-05-23 DIAGNOSIS — F329 Major depressive disorder, single episode, unspecified: Secondary | ICD-10-CM | POA: Insufficient documentation

## 2012-05-23 DIAGNOSIS — Z8639 Personal history of other endocrine, nutritional and metabolic disease: Secondary | ICD-10-CM | POA: Insufficient documentation

## 2012-05-23 DIAGNOSIS — R51 Headache: Secondary | ICD-10-CM | POA: Insufficient documentation

## 2012-05-23 DIAGNOSIS — Z8679 Personal history of other diseases of the circulatory system: Secondary | ICD-10-CM | POA: Insufficient documentation

## 2012-05-23 MED ORDER — ALBUTEROL SULFATE (5 MG/ML) 0.5% IN NEBU
2.5000 mg | INHALATION_SOLUTION | Freq: Once | RESPIRATORY_TRACT | Status: AC
  Administered 2012-05-23: 2.5 mg via RESPIRATORY_TRACT
  Filled 2012-05-23: qty 0.5

## 2012-05-23 MED ORDER — PREDNISONE 10 MG PO TABS: 50.0000 mg | ORAL_TABLET | Freq: Every day | ORAL | Status: DC

## 2012-05-23 MED ORDER — PREDNISONE 20 MG PO TABS
60.0000 mg | ORAL_TABLET | Freq: Once | ORAL | Status: AC
  Administered 2012-05-23: 60 mg via ORAL
  Filled 2012-05-23: qty 3

## 2012-05-23 NOTE — ED Notes (Signed)
Pt took inderal and celexa tonight for the first time for tx of depression and within 30 minutes had a severe asthma attack. Pt took inhaler with no relief and a nebulizer with no relief. Pt received 10mg  albuterol and 1mg  Atrovent on route

## 2012-05-23 NOTE — ED Provider Notes (Signed)
History     CSN: [id number]  Arrival date & time 05/23/12  2304   First MD Initiated Contact with Patient 05/23/12 2306      Chief Complaint  Patient presents with  . Shortness of Breath  . Headache    (Consider location/radiation/quality/duration/timing/severity/associated sxs/prior treatment) Patient is a 37 y.o. female presenting with shortness of breath and headaches. The history is provided by the patient.  Shortness of Breath  The current episode started today. The onset was sudden. The problem occurs frequently. The problem has been unchanged. The problem is severe. Associated symptoms include shortness of breath. The cough has no precipitants.  Headache  This is a new problem. The current episode started less than 1 hour ago. The problem occurs constantly. The problem has not changed since onset.The headache is associated with nothing. The pain is located in the frontal region. The quality of the pain is described as dull. The pain is at a severity of 1/10. Associated symptoms include shortness of breath. She has tried nothing for the symptoms.    Past Medical History  Diagnosis Date  . Asthma   . Migraine   . Obesity     Past Surgical History  Procedure Date  . Tubal ligation     No family history on file.  History  Substance Use Topics  . Smoking status: Current Every Day Smoker -- 4.0 packs/day  . Smokeless tobacco: Not on file  . Alcohol Use: No    OB History    Grav Para Term Preterm Abortions TAB SAB Ect Mult Living                  Review of Systems  Respiratory: Positive for shortness of breath.   Neurological: Positive for headaches.  All other systems reviewed and are negative.    Allergies  Review of patient's allergies indicates no known allergies.  Home Medications   Current Outpatient Rx  Name  Route  Sig  Dispense  Refill  . ALBUTEROL SULFATE HFA 108 (90 BASE) MCG/ACT IN AERS   Inhalation   Inhale 2 puffs into the lungs every 6  (six) hours as needed. For shortness of breath.         . ALBUTEROL SULFATE (5 MG/ML) 0.5% IN NEBU   Nebulization   Take 2.5 mg by nebulization every 6 (six) hours as needed. Shortness of breath         . CITALOPRAM HYDROBROMIDE 40 MG PO TABS   Oral   Take 40 mg by mouth daily.         . MOMETASONE FURO-FORMOTEROL FUM 100-5 MCG/ACT IN AERO   Inhalation   Inhale 2 puffs into the lungs daily.         Marland Kitchen PROPRANOLOL HCL 20 MG PO TABS   Oral   Take 20 mg by mouth 2 (two) times daily.           BP 114/63  Pulse 74  Temp 98.1 F (36.7 C) (Oral)  Resp 24  SpO2 96%  LMP 05/17/2012  Physical Exam  Constitutional: She is oriented to person, place, and time. She appears well-developed and well-nourished.  HENT:  Head: Normocephalic and atraumatic.  Eyes: Conjunctivae normal and EOM are normal. Pupils are equal, round, and reactive to light.  Neck: Normal range of motion.  Cardiovascular: Normal rate, regular rhythm and normal heart sounds.   Pulmonary/Chest: Effort normal. She has wheezes.  Abdominal: Soft. Bowel sounds are normal.  Musculoskeletal: Normal range  of motion.  Neurological: She is alert and oriented to person, place, and time.  Skin: Skin is warm and dry.  Psychiatric: She has a normal mood and affect. Her behavior is normal.    ED Course  Procedures (including critical care time)  Labs Reviewed - No data to display No results found.   No diagnosis found.    MDM  + sob and wheezing after starting new med, mll propanolol with pt underlying mild copd..  Will dc med,  IBR,  Steroid,  reassess        Rosanne Ashing, MD 05/23/12 2326

## 2012-12-02 ENCOUNTER — Emergency Department (HOSPITAL_COMMUNITY): Payer: Medicaid Other

## 2012-12-02 ENCOUNTER — Encounter (HOSPITAL_COMMUNITY): Payer: Self-pay | Admitting: Emergency Medicine

## 2012-12-02 ENCOUNTER — Emergency Department (HOSPITAL_COMMUNITY)
Admission: EM | Admit: 2012-12-02 | Discharge: 2012-12-02 | Disposition: A | Discharge status: Home / Self Care | Payer: Medicaid Other | Attending: Emergency Medicine | Admitting: Emergency Medicine

## 2012-12-02 DIAGNOSIS — R5381 Other malaise: Secondary | ICD-10-CM | POA: Insufficient documentation

## 2012-12-02 DIAGNOSIS — F17200 Nicotine dependence, unspecified, uncomplicated: Secondary | ICD-10-CM | POA: Insufficient documentation

## 2012-12-02 DIAGNOSIS — R519 Headache, unspecified: Secondary | ICD-10-CM

## 2012-12-02 DIAGNOSIS — Z862 Personal history of diseases of the blood and blood-forming organs and certain disorders involving the immune mechanism: Secondary | ICD-10-CM | POA: Insufficient documentation

## 2012-12-02 DIAGNOSIS — Z8679 Personal history of other diseases of the circulatory system: Secondary | ICD-10-CM | POA: Insufficient documentation

## 2012-12-02 DIAGNOSIS — R079 Chest pain, unspecified: Secondary | ICD-10-CM | POA: Insufficient documentation

## 2012-12-02 DIAGNOSIS — J45901 Unspecified asthma with (acute) exacerbation: Secondary | ICD-10-CM | POA: Insufficient documentation

## 2012-12-02 DIAGNOSIS — R002 Palpitations: Secondary | ICD-10-CM | POA: Insufficient documentation

## 2012-12-02 DIAGNOSIS — E669 Obesity, unspecified: Secondary | ICD-10-CM | POA: Insufficient documentation

## 2012-12-02 DIAGNOSIS — Z79899 Other long term (current) drug therapy: Secondary | ICD-10-CM | POA: Insufficient documentation

## 2012-12-02 DIAGNOSIS — G43909 Migraine, unspecified, not intractable, without status migrainosus: Secondary | ICD-10-CM | POA: Insufficient documentation

## 2012-12-02 DIAGNOSIS — Z8639 Personal history of other endocrine, nutritional and metabolic disease: Secondary | ICD-10-CM | POA: Insufficient documentation

## 2012-12-02 HISTORY — DX: Disorder of thyroid, unspecified: E07.9

## 2012-12-02 LAB — BASIC METABOLIC PANEL
Calcium: 9.5 mg/dL (ref 8.4–10.5)
GFR calc Af Amer: >90 mL/min (ref >90)
GFR calc non Af Amer: >90 mL/min (ref >90)
Glucose, Bld: 92 mg/dL (ref 70–99)
Potassium: 3.8 mEq/L (ref 3.5–5.1)
Sodium: 138 mEq/L (ref 135–145)

## 2012-12-02 LAB — CBC WITH DIFFERENTIAL/PLATELET
Basophils Absolute: 0 10*3/uL (ref 0.0–0.1)
Eosinophils Absolute: 0.3 10*3/uL (ref 0.0–0.7)
Lymphs Abs: 2.9 10*3/uL (ref 0.7–4.0)
MCH: 27.8 pg (ref 26.0–34.0)
Neutrophils Relative %: 67 % (ref 43–77)
Platelets: 341 10*3/uL (ref 150–400)
RBC: 4.68 MIL/uL (ref 3.87–5.11)
RDW: 14.8 % (ref 11.5–15.5)
WBC: 12.3 10*3/uL — ABNORMAL HIGH (ref 4.0–10.5)

## 2012-12-02 LAB — POCT I-STAT TROPONIN I: Troponin i, poc: 0 ng/mL (ref 0.00–0.08)

## 2012-12-02 MED ORDER — OXYCODONE-ACETAMINOPHEN 5-325 MG PO TABS: 2.0000 | ORAL_TABLET | Freq: Once | ORAL | Status: DC

## 2012-12-02 MED ORDER — KETOROLAC TROMETHAMINE 30 MG/ML IJ SOLN
30.0000 mg | Freq: Once | INTRAMUSCULAR | Status: AC
  Administered 2012-12-02: 30 mg via INTRAMUSCULAR
  Filled 2012-12-02: qty 1

## 2012-12-02 MED ORDER — ALBUTEROL SULFATE (5 MG/ML) 0.5% IN NEBU
5.0000 mg | INHALATION_SOLUTION | Freq: Once | RESPIRATORY_TRACT | Status: AC
  Administered 2012-12-02: 5 mg via RESPIRATORY_TRACT
  Filled 2012-12-02: qty 1

## 2012-12-02 MED ORDER — DEXAMETHASONE SODIUM PHOSPHATE 10 MG/ML IJ SOLN
10.0000 mg | Freq: Once | INTRAMUSCULAR | Status: AC
  Administered 2012-12-02: 10 mg via INTRAMUSCULAR
  Filled 2012-12-02: qty 1

## 2012-12-02 MED ORDER — HYDROMORPHONE HCL PF 1 MG/ML IJ SOLN
1.0000 mg | Freq: Once | INTRAMUSCULAR | Status: AC
  Administered 2012-12-02: 1 mg via INTRAMUSCULAR
  Filled 2012-12-02: qty 1

## 2012-12-02 MED ORDER — METOCLOPRAMIDE HCL 5 MG/ML IJ SOLN
10.0000 mg | Freq: Once | INTRAMUSCULAR | Status: AC
  Administered 2012-12-02: 10 mg via INTRAMUSCULAR
  Filled 2012-12-02: qty 2

## 2012-12-02 MED ORDER — ASPIRIN 81 MG PO CHEW
324.0000 mg | CHEWABLE_TABLET | Freq: Once | ORAL | Status: AC
  Administered 2012-12-02: 324 mg via ORAL
  Filled 2012-12-02: qty 4

## 2012-12-02 MED ORDER — IPRATROPIUM BROMIDE 0.02 % IN SOLN
0.5000 mg | Freq: Once | RESPIRATORY_TRACT | Status: AC
  Administered 2012-12-02: 0.5 mg via RESPIRATORY_TRACT
  Filled 2012-12-02: qty 2.5

## 2012-12-02 NOTE — ED Notes (Addendum)
Pt. States that she has been having  Chest pain X 1 week. States she has pain between her shoulder blades. She c/o headache starting a week ago and getting worse tonight. States she feels real weak. States she is an asthmatic and feels real wheezy. She has not used her inhaler since 3pm today. 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 Reports hX. Of Fibromyalgia.

## 2012-12-02 NOTE — ED Provider Notes (Signed)
CSN: [id number]     Arrival date & time 12/02/12  4098 History     First MD Initiated Contact with Patient 12/02/12 [id number]     Chief Complaint  Patient presents with  . Headache  . Chest Pain   HPI  History provided by the patient. Patient is a 37 year old female with history of asthma, migraines, fibromyalgia who presents with multiple complaints. Patient states that she has been having intermittent heart palpitations and discomfort to her sternal and left chest area for the past one week. Symptoms come and go at any time whether at rest or active. They are brief lasting only a few minutes. There some times associated with tightness. Patient also states she has felt some increased asthma and wheezing that is slightly worse from her baseline. States she has wheezing almost daily. She has used her inhalers with last use around 3 PM yesterday. This does not always help with her symptoms. She also complains of intermittent headaches during this time which suddenly became much worse over the course of the evening and early this morning. She's not been able to sleep due to discomfort. Headache is mostly across the forehead slightly to the posterior aspect radiating into the neck. She denies neck pain or stiffness. Pseudoknot worse with movements of the neck. Patient also complains of having increased general fatigue over the past week or more. She does mention that she is currently being worked up for a thyroid nodule. She did have a needle biopsy but was told that they do not collect enough cells and she is scheduled for a repeat biopsy in the coming weeks. She is not currently on any medications for her thyroid.     Past Medical History  Diagnosis Date  . Asthma   . Migraine   . Obesity   . Thyroid disease    Past Surgical History  Procedure Laterality Date  . Tubal ligation    . Nasal septum surgery     No family history on file. History  Substance Use Topics  . Smoking status: Current  Every Day Smoker -- 0.50 packs/day    Types: Cigarettes  . Smokeless tobacco: Not on file  . Alcohol Use: No   OB History   Grav Para Term Preterm Abortions TAB SAB Ect Mult Living                 Review of Systems  Constitutional: Positive for fatigue. Negative for fever and chills.  Eyes: Negative for photophobia.  Respiratory: Positive for shortness of breath and wheezing.   Cardiovascular: Positive for chest pain and palpitations.  Genitourinary: Negative for dysuria, frequency, hematuria and flank pain.  Skin: Negative for rash.  Neurological: Positive for headaches. Negative for dizziness, weakness, light-headedness and numbness.  All other systems reviewed and are negative.    Allergies  Beta adrenergic blockers  Home Medications   Current Outpatient Rx  Name  Route  Sig  Dispense  Refill  . albuterol (PROVENTIL HFA;VENTOLIN HFA) 108 (90 BASE) MCG/ACT inhaler   Inhalation   Inhale 2 puffs into the lungs every 6 (six) hours as needed. For shortness of breath.         Marland Kitchen albuterol (PROVENTIL) (5 MG/ML) 0.5% nebulizer solution   Nebulization   Take 2.5 mg by nebulization every 6 (six) hours as needed. Shortness of breath         . LORazepam (ATIVAN) 1 MG tablet   Oral   Take 1 mg by mouth every  6 (six) hours as needed for anxiety.         . mometasone-formoterol (DULERA) 100-5 MCG/ACT AERO   Inhalation   Inhale 2 puffs into the lungs daily.         . pregabalin (LYRICA) 150 MG capsule   Oral   Take 150 mg by mouth 2 (two) times daily.         Marland Kitchen zolpidem (AMBIEN) 10 MG tablet   Oral   Take 10 mg by mouth at bedtime as needed for sleep.          BP 143/87  Pulse 79  Temp(Src) 99 F (37.2 C) (Oral)  Resp 20  SpO2 98% Physical Exam  Nursing note and vitals reviewed. Constitutional: She is oriented to person, place, and time. She appears well-developed and well-nourished. No distress.  Obese  HENT:  Head: Normocephalic.  Eyes: Conjunctivae  and EOM are normal. Pupils are equal, round, and reactive to light.  Neck: Normal range of motion. Neck supple. No tracheal deviation present.  Cardiovascular: Normal rate and regular rhythm.   Pulmonary/Chest: Effort normal. No stridor. No respiratory distress. She has wheezes. She has no rales. She exhibits tenderness.  Abdominal: Soft. There is no tenderness.  Musculoskeletal: Normal range of motion. She exhibits no edema and no tenderness.  No clinical concerns for DVT  Neurological: She is alert and oriented to person, place, and time. She has normal strength. No cranial nerve deficit or sensory deficit. Gait normal.  Skin: Skin is warm and dry. No rash noted.  Psychiatric: She has a normal mood and affect. Her behavior is normal.    ED Course   Procedures   Results for orders placed during the hospital encounter of 12/02/12  CBC WITH DIFFERENTIAL      Result Value Range   WBC 12.3 (*) 4.0 - 10.5 K/uL   RBC 4.68  3.87 - 5.11 MIL/uL   Hemoglobin 13.0  12.0 - 15.0 g/dL   HCT 16.1  09.6 - 04.5 %   MCV 81.2  78.0 - 100.0 fL   MCH 27.8  26.0 - 34.0 pg   MCHC 34.2  30.0 - 36.0 g/dL   RDW 40.9  81.1 - 91.4 %   Platelets 341  150 - 400 K/uL   Neutrophils Relative % 67  43 - 77 %   Neutro Abs 8.3 (*) 1.7 - 7.7 K/uL   Lymphocytes Relative 23  12 - 46 %   Lymphs Abs 2.9  0.7 - 4.0 K/uL   Monocytes Relative 7  3 - 12 %   Monocytes Absolute 0.9  0.1 - 1.0 K/uL   Eosinophils Relative 2  0 - 5 %   Eosinophils Absolute 0.3  0.0 - 0.7 K/uL   Basophils Relative 0  0 - 1 %   Basophils Absolute 0.0  0.0 - 0.1 K/uL  BASIC METABOLIC PANEL      Result Value Range   Sodium 138  135 - 145 mEq/L   Potassium 3.8  3.5 - 5.1 mEq/L   Chloride 101  96 - 112 mEq/L   CO2 25  19 - 32 mEq/L   Glucose, Bld 92  70 - 99 mg/dL   BUN 12  6 - 23 mg/dL   Creatinine, Ser 7.82  0.50 - 1.10 mg/dL   Calcium 9.5  8.4 - 95.6 mg/dL   GFR calc non Af Amer >90  >90 mL/min   GFR calc Af Amer >90  >90 mL/min  POCT  I-STAT TROPONIN I      Result Value Range   Troponin i, poc 0.00  0.00 - 0.08 ng/mL   Comment 3               Dg Chest 2 View  12/02/2012   *RADIOLOGY REPORT*  Clinical Data: Headache.  Sternal chest pain.  CHEST - 2 VIEW  Comparison: 04/02/2012.  Findings: No significant osseous abnormality.  Lungs are clear. No effusion or pneumothorax.  Cardiomediastinal size and contour are within normal limits.  The upper abdomen is unremarkable.  IMPRESSION: No evidence of acute cardiopulmonary disease.   Original Report Authenticated By: Tiburcio Pea      1. Headache     MDM  Patient seen and evaluated. Patient appears well in no acute distress. Normal respirations and O2 sat.  Pt is PERC negative.  No concerning findings on lab tests, EKG and chest x-ray. Patient reports chest symptoms and back pains have resolved. She continues to have migraine headache. Migraine cocktail ordered.  Patient having some relief after IM injections for migraine cocktail. She still is having some headache pains. Patient however is requesting to leave at this time. Have ordered one additional dose of pain medication for the pain. She has been advised to followup with PCP for continued evaluation of her thyroid and treatment of her chronic prior myalgia and migraine pains. Patient has atypical symptoms for ACS and initial workup was unremarkable.      Date: 12/02/2012  Rate: 78  Rhythm: normal sinus rhythm  QRS Axis: normal  Intervals: normal  ST/T Wave abnormalities: normal  Conduction Disutrbances:none  Narrative Interpretation:   Old EKG Reviewed: none available    Angus Seller, PA-C 12/02/12 2009

## 2012-12-03 NOTE — ED Provider Notes (Signed)
Medical screening examination/treatment/procedure(s) were performed by non-physician practitioner and as supervising physician I was immediately available for consultation/collaboration.  Jones Skene, M.D.     Jones Skene, MD 12/03/12 0150

## 2012-12-27 ENCOUNTER — Emergency Department (HOSPITAL_COMMUNITY)
Admission: EM | Admit: 2012-12-27 | Discharge: 2012-12-27 | Disposition: A | Discharge status: Home / Self Care | Payer: Medicaid Other | Attending: Emergency Medicine | Admitting: Emergency Medicine

## 2012-12-27 ENCOUNTER — Emergency Department (HOSPITAL_COMMUNITY): Payer: Medicaid Other

## 2012-12-27 ENCOUNTER — Encounter (HOSPITAL_COMMUNITY): Payer: Self-pay | Admitting: Emergency Medicine

## 2012-12-27 DIAGNOSIS — J45909 Unspecified asthma, uncomplicated: Secondary | ICD-10-CM | POA: Insufficient documentation

## 2012-12-27 DIAGNOSIS — M79609 Pain in unspecified limb: Secondary | ICD-10-CM | POA: Insufficient documentation

## 2012-12-27 DIAGNOSIS — R002 Palpitations: Secondary | ICD-10-CM | POA: Insufficient documentation

## 2012-12-27 DIAGNOSIS — K219 Gastro-esophageal reflux disease without esophagitis: Secondary | ICD-10-CM | POA: Insufficient documentation

## 2012-12-27 DIAGNOSIS — Z79899 Other long term (current) drug therapy: Secondary | ICD-10-CM | POA: Insufficient documentation

## 2012-12-27 DIAGNOSIS — E669 Obesity, unspecified: Secondary | ICD-10-CM | POA: Insufficient documentation

## 2012-12-27 DIAGNOSIS — Z862 Personal history of diseases of the blood and blood-forming organs and certain disorders involving the immune mechanism: Secondary | ICD-10-CM | POA: Insufficient documentation

## 2012-12-27 DIAGNOSIS — IMO0002 Reserved for concepts with insufficient information to code with codable children: Secondary | ICD-10-CM | POA: Insufficient documentation

## 2012-12-27 DIAGNOSIS — M722 Plantar fascial fibromatosis: Secondary | ICD-10-CM | POA: Insufficient documentation

## 2012-12-27 DIAGNOSIS — F17200 Nicotine dependence, unspecified, uncomplicated: Secondary | ICD-10-CM | POA: Insufficient documentation

## 2012-12-27 DIAGNOSIS — Z8639 Personal history of other endocrine, nutritional and metabolic disease: Secondary | ICD-10-CM | POA: Insufficient documentation

## 2012-12-27 DIAGNOSIS — G43909 Migraine, unspecified, not intractable, without status migrainosus: Secondary | ICD-10-CM | POA: Insufficient documentation

## 2012-12-27 LAB — CBC
HCT: 37.4 % (ref 36.0–46.0)
MCH: 27 pg (ref 26.0–34.0)
MCV: 82 fL (ref 78.0–100.0)
RBC: 4.56 MIL/uL (ref 3.87–5.11)
WBC: 9.3 10*3/uL (ref 4.0–10.5)

## 2012-12-27 LAB — BASIC METABOLIC PANEL
BUN: 9 mg/dL (ref 6–23)
CO2: 24 mEq/L (ref 19–32)
Calcium: 8.9 mg/dL (ref 8.4–10.5)
Chloride: 104 mEq/L (ref 96–112)
Creatinine, Ser: 0.69 mg/dL (ref 0.50–1.10)
Glucose, Bld: 93 mg/dL (ref 70–99)

## 2012-12-27 LAB — D-DIMER, QUANTITATIVE: D-Dimer, Quant: 0.38 ug/mL-FEU (ref 0.00–0.48)

## 2012-12-27 MED ORDER — IBUPROFEN 400 MG PO TABS: 400.0000 mg | ORAL_TABLET | Freq: Three times a day (TID) | ORAL | Status: DC | PRN

## 2012-12-27 MED ORDER — IBUPROFEN 200 MG PO TABS
400.0000 mg | ORAL_TABLET | Freq: Once | ORAL | Status: AC
  Administered 2012-12-27: 400 mg via ORAL
  Filled 2012-12-27: qty 2

## 2012-12-27 NOTE — ED Provider Notes (Signed)
CSN: [id number]     Arrival date & time 12/27/12  0830 History   None    Chief Complaint  Patient presents with  . Leg Pain  . Palpitations   (Consider location/radiation/quality/duration/timing/severity/associated sxs/prior Treatment) HPI 37 y o W F with PMH of Asthma, migraines, GERD, obesity, presents today with c/o of leg pains and palpitations which both started a week ago. Patient describes an intermittent weird feeling in her chest, with abnormal awareness of her heart beat. No SOB, no dizziness, no chest pain. She denies use of illicit drugs, current medications- albuterol, lorazepam, dulera, omeprazole, pregabalin, zolpidem.  Leg pains started 1 week ago also, on the inner side of her Lt leg, from just below her knee to her thigh. Also compliants of Rt heel pain, worse when she initially put weight on her legs, gradually gets better after walking some distance. No fever, warmth,redness or swelling, no cough. No hx of trauma or falls. Patient says she is been worked up for thyroid nodules, she has had a n USS done, and a biopsy, to have a repeat biopsy later this year, as she was told that there weren't enough cells collected in the first biopsy.   Past Medical History  Diagnosis Date  . Asthma   . Migraine   . Obesity   . Thyroid disease    Past Surgical History  Procedure Laterality Date  . Tubal ligation    . Nasal septum surgery     History reviewed. No pertinent family history. History  Substance Use Topics  . Smoking status: Current Every Day Smoker -- 0.50 packs/day    Types: Cigarettes  . Smokeless tobacco: Not on file  . Alcohol Use: No   OB History   Grav Para Term Preterm Abortions TAB SAB Ect Mult Living                 Review of Systems CONSTITUTIONAL- No Fever, weightloss, night sweat,or change in appetite. SKIN- No Rash, colour changes or itching. HEAD- No Headache or dizziness. GI- No Dysphagia, nausea, vomiting, diarrhoea, constipation, abd  pain. URINARY- No Frequency or dysuria. NEUROLOGIC- No Numbness, syncope. PYSCH- No feelings or sadness, crying spells or suicidal ideations.  Allergies  Beta adrenergic blockers  Home Medications   Current Outpatient Rx  Name  Route  Sig  Dispense  Refill  . albuterol (PROVENTIL HFA;VENTOLIN HFA) 108 (90 BASE) MCG/ACT inhaler   Inhalation   Inhale 2 puffs into the lungs every 6 (six) hours as needed. For shortness of breath.         Marland Kitchen albuterol (PROVENTIL) (5 MG/ML) 0.5% nebulizer solution   Nebulization   Take 2.5 mg by nebulization every 6 (six) hours as needed. Shortness of breath         . calcium carbonate (TUMS EX) 750 MG chewable tablet   Oral   Chew 1 tablet by mouth 2 (two) times daily.         Marland Kitchen LORazepam (ATIVAN) 1 MG tablet   Oral   Take 1 mg by mouth every 6 (six) hours as needed for anxiety.         . mometasone-formoterol (DULERA) 100-5 MCG/ACT AERO   Inhalation   Inhale 2 puffs into the lungs daily.         Marland Kitchen omeprazole (PRILOSEC) 20 MG capsule   Oral   Take 20 mg by mouth 2 (two) times daily.         . pregabalin (LYRICA) 150 MG capsule  Oral   Take 150 mg by mouth 2 (two) times daily.         . pregabalin (LYRICA) 225 MG capsule   Oral   Take 225 mg by mouth daily.         Marland Kitchen zolpidem (AMBIEN) 10 MG tablet   Oral   Take 10 mg by mouth at bedtime as needed for sleep.         Marland Kitchen ibuprofen (ADVIL,MOTRIN) 400 MG tablet   Oral   Take 1 tablet (400 mg total) by mouth every 8 (eight) hours as needed for pain.   20 tablet   0    BP 100/57  Pulse 77  Temp(Src) 98.3 F (36.8 C) (Oral)  Resp 25  SpO2 99%  LMP 12/13/2012 Physical Exam  GENERAL- alert, co-operative, appears as stated age, not in any distress. HEENT- Atraumatic, normocephalic, PERRL, EOMI, oral mucosa appears dry, good and intact dentition. No cervical LN enlargement, fullness in the thyroid area. CARDIAC- RRR, no murmurs, rubs or gallops. RESP- Moving equal  volumes of air, and clear to auscultation bilaterally. ABDOMEN- Soft,non tender, no palpable masses or organomegaly, bowel sounds present. BACK- Normal curvature of the spine, No tenderness along the vertebrae, no CVA tenderness. NEURO- Cr N 2-12 intact, strenght equal and present in all extremities. EXTREMITIES- pulse 2+, symmetric. No tenderness or redness on the either lower limbs. SKIN- Warm, dry, No rash or lesion. PSYCH- Normal mood and affect, appropriate thought content and speech.  ED Course  Procedures (including critical care time) Labs Review Labs Reviewed  CBC  BASIC METABOLIC PANEL  D-DIMER, QUANTITATIVE   Imaging Review Dg Foot 2 Views Right  12/27/2012   *RADIOLOGY REPORT*  Clinical Data: Right heel pain.  RIGHT FOOT - 2 VIEW  Comparison: None.  Findings: There is no acute bony or joint abnormality.  Small calcaneal spurs are noted.  IMPRESSION: No acute finding.   Original Report Authenticated By: Holley Dexter, M.D.    MDM   1. Heart palpitations   2. Plantar fasciitis of right foot    Palpitations- Patient is being worked up for her thyroid nodules-, Possible hyperthyroidism. Will get BMP, CBC. BMP- No electrolyte abnormality. For her Rt heel pain- Xrays done shows heel spurs, Leg pain likely due to Planter fascitis.  Also a DDimer to r/o possible DVT in this patient, as patient complained of Lt heel pain, though pt is low risk. DDimer- Negative. Patient discharged home to follow up with her PCP. Patient told to take NSAIDs- Ibuprofen 400mg  Q8H for 2 weeks and to reduce activity on Rt foot that aggrav the pain, to follow up with PCP if pain does not resolve. Patient verbalized understanding and agreed with the plan.    Kennis Carina, MD 12/27/12 [phone]
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
## 2012-12-27 NOTE — ED Notes (Signed)
Pharmacy tech at bedside 

## 2012-12-27 NOTE — ED Notes (Signed)
Attempted to draw labs, unsuccessful. Phlebotomy in room.

## 2012-12-27 NOTE — ED Provider Notes (Signed)
Medical screening examination/treatment/procedure(s) were conducted as a shared visit with the resident and myself.  I personally evaluated the patient during the encounter  I interviewed and examined the pt. Abdomen is benign, CTAB, cardiac exam wnl. Normal appearing LE's, exam is limited d/t obesity. Low risk for DVT. Will get d-dimer.     Date: 12/27/2012  Rate: 83  Rhythm: normal sinus rhythm  QRS Axis: normal  Intervals: normal  ST/T Wave abnormalities: normal  Conduction Disutrbances:none  Narrative Interpretation: No ST or T wave changes consistent with ischemia.    Old EKG Reviewed: unchanged    Junius Argyle, MD 12/27/12 1945

## 2012-12-27 NOTE — ED Notes (Signed)
Pt c/o bilateral leg pain x 1 week with palpitations

## 2013-01-29 ENCOUNTER — Encounter: Payer: Self-pay | Admitting: *Deleted

## 2013-01-30 ENCOUNTER — Ambulatory Visit (INDEPENDENT_AMBULATORY_CARE_PROVIDER_SITE_OTHER): Payer: Medicaid Other | Admitting: Internal Medicine

## 2013-01-30 ENCOUNTER — Encounter: Payer: Self-pay | Admitting: Internal Medicine

## 2013-01-30 VITALS — BP 130/82 | HR 88 | Temp 98.4°F | Ht 64.0 in | Wt 339.0 lb

## 2013-01-30 DIAGNOSIS — Z23 Encounter for immunization: Secondary | ICD-10-CM

## 2013-01-30 DIAGNOSIS — J45909 Unspecified asthma, uncomplicated: Secondary | ICD-10-CM

## 2013-01-30 DIAGNOSIS — F17200 Nicotine dependence, unspecified, uncomplicated: Secondary | ICD-10-CM

## 2013-01-30 MED ORDER — MOMETASONE FURO-FORMOTEROL FUM 200-5 MCG/ACT IN AERO: INHALATION_SPRAY | RESPIRATORY_TRACT | Status: DC

## 2013-01-30 NOTE — Progress Notes (Signed)
  Subjective:    Patient ID: Cindy Hoffman, female    DOB: 08/28/1977MRN: [id number]  HPI  37 yowf active smoker never had good ex tol child dx as asthma but "no need" for maint inhaler until 37s worse with pregnancy age 37 and on rescue inhaler at least once a day and failed qvar, singulair best on dulera but still needing once day saba requesting trx to [facility] from Dr Blenda Nicely in [place] due to convenience as lives in [place] so referred to pulmonary by Virl Son.  01/30/2013 1st [facility] office visit/ Chey Cho cc daily need for saba x years during the day but sleeps well on cpap s noct wheeze or resp disturbance.  Very sedentary but not typically limited by breathing as long as stays on dulera and has access to saba and no recent flares.  No obvious day to day or daytime variabilty or assoc chronic cough or cp or chest tightness, subjective wheeze overt sinus or hb symptoms. No unusual exp hx or h/o childhood pna/ asthma or knowledge of premature birth.  Sleeping ok without nocturnal  or early am exacerbation  of respiratory  c/o's or need for noct saba. Also denies any obvious fluctuation of symptoms with weather or environmental changes or other aggravating or alleviating factors except as outlined above   Current Medications, Allergies, Complete Past Medical History, Past Surgical History, Family History, and Social History were reviewed in [employer] record.      Review of Systems  Constitutional: Positive for unexpected weight change. Negative for fever.  HENT: Negative for ear pain, nosebleeds, congestion, sore throat, rhinorrhea, sneezing, trouble swallowing, dental problem, postnasal drip and sinus pressure.   Eyes: Negative for redness and itching.  Respiratory: Positive for shortness of breath and wheezing. Negative for cough and chest tightness.   Cardiovascular: Negative for palpitations and leg swelling.  Gastrointestinal: Negative  for nausea and vomiting.  Genitourinary: Negative for dysuria.  Musculoskeletal: Positive for joint swelling.  Skin: Negative for rash.  Neurological: Positive for headaches.  Hematological: Does not bruise/bleed easily.  Psychiatric/Behavioral: Negative for dysphoric mood. The patient is nervous/anxious.        Objective:   Physical Exam  Pleasant amb wf nad Wt Readings from Last 3 Encounters:  01/30/13 339 lb (153.769 kg)    HEENT: nl dentition, turbinates, and orophanx. Nl external ear canals without cough reflex   NECK :  without JVD/Nodes/TM/ nl carotid upstrokes bilaterally   LUNGS: no acc muscle use, clear to A and P bilaterally without cough on insp or exp maneuvers   CV:  RRR  no s3 or murmur or increase in P2, no edema   ABD:  soft and nontender with nl excursion in the supine position. No bruits or organomegaly, bowel sounds nl  MS:  warm without deformities, calf tenderness, cyanosis or clubbing  SKIN: warm and dry without lesions    NEURO:  alert, approp, no deficits    12/02/12 cxr wnl     Assessment & Plan:

## 2013-01-30 NOTE — Patient Instructions (Addendum)
Change dulera 200 Take 2 puffs first thing in am and then another 2 puffs about 12 hours later.     Only use your albuterol as a rescue medication to be used if you can't catch your breath by resting or doing a relaxed purse lip breathing pattern.  - The less you use it, the better it will work when you need it. - Ok to use up to every 4 hours if you must but call for immediate appointment if use goes up over your usual need - Don't leave home without it !!  (think of it like your spare tire for your car)   Please schedule a follow up office visit in 4 weeks, sooner if needed with bring all medications with you

## 2013-02-01 DIAGNOSIS — F17200 Nicotine dependence, unspecified, uncomplicated: Secondary | ICD-10-CM | POA: Insufficient documentation

## 2013-02-01 DIAGNOSIS — J45909 Unspecified asthma, uncomplicated: Secondary | ICD-10-CM | POA: Insufficient documentation

## 2013-02-01 NOTE — Assessment & Plan Note (Signed)
I took an extended  opportunity with this patient to outline the consequences of continued cigarette use  in airway disorders based on all the data we have from the multiple national lung health studies (perfomed over decades at millions of dollars in cost)  indicating that smoking cessation, not choice of inhalers or physicians, is the most important aspect of care.   

## 2013-02-01 NOTE — Assessment & Plan Note (Signed)
She is using daily saba in some form so clearly breaking the rule of 2s and ? Evolving to copd  - DDX of  difficult airways managment all start with A and  include Adherence, Ace Inhibitors, Acid Reflux, Active Sinus Disease, Alpha 1 Antitripsin deficiency, Anxiety masquerading as Airways dz,  ABPA,  allergy(esp in young), Aspiration (esp in elderly), Adverse effects of DPI,  Active smokers, plus two Bs  = Bronchiectasis and Beta blocker use..and one C= CHF  Adherence is always the initial "prime suspect" and is a multilayered concern that requires a "trust but verify" approach in every patient - starting with knowing how to use medications, especially inhalers, correctly, keeping up with refills and understanding the fundamental difference between maintenance and prns vs those medications only taken for a very short course and then stopped and not refilled. Will ask her to bring all meds with her next time   The proper method of use, as well as anticipated side effects, of a metered-dose inhaler are discussed and demonstrated to the patient. Improved effectiveness after extensive coaching during this visit to a level of approximately  75% so try dulera 200 2bid with goal of less than twice weekly alb  Active smoking > discussed separately

## 2013-02-26 ENCOUNTER — Encounter: Payer: Self-pay | Admitting: Internal Medicine

## 2013-02-26 ENCOUNTER — Ambulatory Visit (INDEPENDENT_AMBULATORY_CARE_PROVIDER_SITE_OTHER): Payer: Medicaid Other | Admitting: Internal Medicine

## 2013-02-26 VITALS — BP 130/80 | HR 70 | Temp 98.0°F | Ht 64.0 in | Wt 342.0 lb

## 2013-02-26 DIAGNOSIS — J45909 Unspecified asthma, uncomplicated: Secondary | ICD-10-CM

## 2013-02-26 DIAGNOSIS — F17200 Nicotine dependence, unspecified, uncomplicated: Secondary | ICD-10-CM

## 2013-02-26 MED ORDER — ALBUTEROL SULFATE HFA 108 (90 BASE) MCG/ACT IN AERS: 2.0000 | INHALATION_SPRAY | Freq: Four times a day (QID) | RESPIRATORY_TRACT | Status: DC | PRN

## 2013-02-26 MED ORDER — FAMOTIDINE 20 MG PO TABS: ORAL_TABLET | ORAL | Status: DC

## 2013-02-26 NOTE — Progress Notes (Signed)
Subjective:    Patient ID: Cindy Hoffman, female    DOB: November 13, 1977MRN: [id number]    Brief patient profile:  83 yowf active smoker never had good ex tol child dx as asthma but "no need" for maint inhaler until 20s worse with pregnancy age 37 and on rescue inhaler at least once a day since then and failed qvar, singulair best on dulera but still needing once day saba requesting trx to [facility] from Dr Blenda Nicely in [place] due to convenience as lives in [place] so referred to pulmonary by Virl Son.   History of Present Illness  01/30/2013 1st [facility] office visit/ Cindy Hoffman cc daily need for saba x years during the day but sleeps well on cpap s noct wheeze or resp disturbance.  Very sedentary but not typically limited by breathing as long as stays on dulera and has access to saba and no recent flares. rec Change dulera 200 Take 2 puffs first thing in am and then another 2 puffs about 12 hours later.  Only use your albuterol as a rescue medication   Please schedule a follow up office visit in 4 weeks, sooner if needed with bring all medications with you    02/26/2013 f/u ov/Cindy Hoffman active smoker re:  Chief Complaint  Patient presents with  . Follow-up    Pt states that her SOB is some better- using rescue inhaler twice per day. She has not needed to use her neb.    Once a week wake up and need saba but mostly needs it during the day"if over does it"    No obvious day to day or daytime variabilty or assoc chronic cough or cp or chest tightness, subjective wheeze overt sinus or hb symptoms. No unusual exp hx or h/o childhood pna/ asthma or knowledge of premature birth.  Sleeping ok without nocturnal  or early am exacerbation  of respiratory  c/o's or need for noct saba. Also denies any obvious fluctuation of symptoms with weather or environmental changes or other aggravating or alleviating factors except as outlined above   Current Medications, Allergies, Complete Past  Medical History, Past Surgical History, Family History, and Social History were reviewed in [employer] record.  ROS  The following are not active complaints unless bolded sore throat, dysphagia, dental problems, itching, sneezing,  nasal congestion or excess/ purulent secretions, ear ache,   fever, chills, sweats, unintended wt loss, pleuritic or exertional cp, hemoptysis,  orthopnea pnd or leg swelling, presyncope, palpitations, heartburn, abdominal pain, anorexia, nausea, vomiting, diarrhea  or change in bowel or urinary habits, change in stools or urine, dysuria,hematuria,  rash, arthralgias, visual complaints, headache, numbness weakness or ataxia or problems with walking or coordination,  change in mood/affect or memory.                 Objective:   Physical Exam  Pleasant amb wf nad  Wt Readings from Last 3 Encounters:  02/26/13 342 lb (155.13 kg)  01/30/13 339 lb (153.769 kg)      HEENT: nl dentition, turbinates, and orophanx. Nl external ear canals without cough reflex   NECK :  without JVD/Nodes/TM/ nl carotid upstrokes bilaterally   LUNGS: no acc muscle use, clear to A and P bilaterally without cough on insp or exp maneuvers   CV:  RRR  no s3 or murmur or increase in P2, no edema   ABD:  soft and nontender with nl excursion in the supine position. No bruits or organomegaly, bowel  sounds nl  MS:  warm without deformities, calf tenderness, cyanosis or clubbing  SKIN: warm and dry without lesions    NEURO:  alert, approp, no deficits    12/02/12 cxr wnl     Assessment & Plan:

## 2013-02-26 NOTE — Patient Instructions (Addendum)
Work on inhaler technique:  relax and gently blow all the way out then take a nice smooth deep breath back in, triggering the inhaler at same time you start breathing in.  Hold for up to 5 seconds if you can.  Rinse and gargle with water when done  Only use your albuterol as a rescue medication to be used if you can't catch your breath by resting or doing a relaxed purse lip breathing pattern.  - The less you use it, the better it will work when you need it. - Ok to use up to every 4 hours if you must but call for immediate appointment if use goes up over your usual need - Don't leave home without it !!  (think of it like your spare tire for your car)   The key is to stop smoking completely before smoking completely stops you!   Pepcid  20 mg one at bedtime  GERD (REFLUX)  is an extremely common cause of respiratory symptoms, many times with no significant heartburn at all.    It can be treated with medication, but also with lifestyle changes including avoidance of late meals, excessive alcohol, smoking cessation, and avoid fatty foods, chocolate, peppermint, colas, red wine, and acidic juices such as orange juice.  NO MINT OR MENTHOL PRODUCTS SO NO COUGH DROPS  USE SUGARLESS CANDY INSTEAD (jolley ranchers or Stover's)  NO OIL BASED VITAMINS - use powdered substitutes.  Please schedule a follow up office visit in 4 weeks, sooner if needed all medications in hand and PFT's on return

## 2013-02-28 NOTE — Assessment & Plan Note (Addendum)
DDX of  difficult airways managment all start with A and  include Adherence, Ace Inhibitors, Acid Reflux, Active Sinus Disease, Alpha 1 Antitripsin deficiency, Anxiety masquerading as Airways dz,  ABPA,  allergy(esp in young), Aspiration (esp in elderly), Adverse effects of DPI,  Active smokers, plus two Bs  = Bronchiectasis and Beta blocker use..and one C= CHF  Adherence is always the initial "prime suspect" and is a multilayered concern that requires a "trust but verify" approach in every patient - starting with knowing how to use medications, especially inhalers, correctly, keeping up with refills and understanding the fundamental difference between maintenance and prns vs those medications only taken for a very short course and then stopped and not refilled.  - Will ask her to bring all meds with her to next ov - The proper method of use, as well as anticipated side effects, of a metered-dose inhaler are discussed and demonstrated to the patient. Improved effectiveness after extensive coaching during this visit to a level of approximately  75% so clearly needs to keep working on it  Active smoking bigger concer > needs pfts to r/o copd (see sep a/p)  ? Acid (or non-acid) GERD with major risk being obesity/ smoking > always difficult to exclude as up to 75% of pts in some series report no assoc GI/ Heartburn symptoms> rec add hs   acid suppression to see if helps with noct saba dep

## 2013-02-28 NOTE — Assessment & Plan Note (Addendum)
>   3 min discussion I reviewed the Flethcher curve with patient that basically indicates  if you quit smoking when your best day FEV1 is still well preserved (which hers appears to be pending pfts) it is highly unlikely you will progress to severe disease and informed the patient there was no medication on the market that has proven to change the curve or the likelihood of progression.  Therefore stopping smoking and maintaining abstinence is the most important aspect of care, not choice of inhalers or for that matter, doctors.

## 2013-03-12 ENCOUNTER — Emergency Department (HOSPITAL_COMMUNITY)
Admission: EM | Admit: 2013-03-12 | Discharge: 2013-03-12 | Disposition: A | Discharge status: Home / Self Care | Payer: Medicaid Other | Attending: Emergency Medicine | Admitting: Emergency Medicine

## 2013-03-12 ENCOUNTER — Emergency Department (HOSPITAL_COMMUNITY): Payer: Medicaid Other

## 2013-03-12 ENCOUNTER — Encounter (HOSPITAL_COMMUNITY): Payer: Self-pay | Admitting: Emergency Medicine

## 2013-03-12 DIAGNOSIS — F411 Generalized anxiety disorder: Secondary | ICD-10-CM | POA: Insufficient documentation

## 2013-03-12 DIAGNOSIS — J159 Unspecified bacterial pneumonia: Secondary | ICD-10-CM | POA: Insufficient documentation

## 2013-03-12 DIAGNOSIS — Z8781 Personal history of (healed) traumatic fracture: Secondary | ICD-10-CM | POA: Insufficient documentation

## 2013-03-12 DIAGNOSIS — E669 Obesity, unspecified: Secondary | ICD-10-CM | POA: Insufficient documentation

## 2013-03-12 DIAGNOSIS — K219 Gastro-esophageal reflux disease without esophagitis: Secondary | ICD-10-CM | POA: Insufficient documentation

## 2013-03-12 DIAGNOSIS — Z792 Long term (current) use of antibiotics: Secondary | ICD-10-CM | POA: Insufficient documentation

## 2013-03-12 DIAGNOSIS — Z8739 Personal history of other diseases of the musculoskeletal system and connective tissue: Secondary | ICD-10-CM | POA: Insufficient documentation

## 2013-03-12 DIAGNOSIS — F17200 Nicotine dependence, unspecified, uncomplicated: Secondary | ICD-10-CM | POA: Insufficient documentation

## 2013-03-12 DIAGNOSIS — J189 Pneumonia, unspecified organism: Secondary | ICD-10-CM

## 2013-03-12 DIAGNOSIS — Z79899 Other long term (current) drug therapy: Secondary | ICD-10-CM | POA: Insufficient documentation

## 2013-03-12 DIAGNOSIS — J45901 Unspecified asthma with (acute) exacerbation: Secondary | ICD-10-CM | POA: Insufficient documentation

## 2013-03-12 DIAGNOSIS — IMO0002 Reserved for concepts with insufficient information to code with codable children: Secondary | ICD-10-CM | POA: Insufficient documentation

## 2013-03-12 DIAGNOSIS — G43909 Migraine, unspecified, not intractable, without status migrainosus: Secondary | ICD-10-CM | POA: Insufficient documentation

## 2013-03-12 HISTORY — DX: Unspecified fracture of right foot, initial encounter for closed fracture: S92.901A

## 2013-03-12 LAB — POCT I-STAT, CHEM 8
Calcium, Ion: 1.16 mmol/L (ref 1.12–1.23)
Chloride: 106 mEq/L (ref 96–112)
Glucose, Bld: 129 mg/dL — ABNORMAL HIGH (ref 70–99)
HCT: 41 % (ref 36.0–46.0)
TCO2: 21 mmol/L (ref 0–100)

## 2013-03-12 LAB — CBC WITH DIFFERENTIAL/PLATELET
Basophils Relative: 0 % (ref 0–1)
Eosinophils Relative: 0 % (ref 0–5)
HCT: 39.1 % (ref 36.0–46.0)
Hemoglobin: 12.9 g/dL (ref 12.0–15.0)
MCH: 27 pg (ref 26.0–34.0)
MCHC: 33 g/dL (ref 30.0–36.0)
MCV: 81.8 fL (ref 78.0–100.0)
Monocytes Absolute: 0.4 10*3/uL (ref 0.1–1.0)
Monocytes Relative: 3 % (ref 3–12)
Neutro Abs: 15.8 10*3/uL — ABNORMAL HIGH (ref 1.7–7.7)

## 2013-03-12 MED ORDER — ALBUTEROL SULFATE (5 MG/ML) 0.5% IN NEBU
2.5000 mg | INHALATION_SOLUTION | Freq: Once | RESPIRATORY_TRACT | Status: AC
  Administered 2013-03-12: 2.5 mg via RESPIRATORY_TRACT
  Filled 2013-03-12: qty 0.5

## 2013-03-12 MED ORDER — PREDNISONE 20 MG PO TABS
60.0000 mg | ORAL_TABLET | Freq: Once | ORAL | Status: AC
  Administered 2013-03-12: 60 mg via ORAL
  Filled 2013-03-12: qty 3

## 2013-03-12 MED ORDER — PREDNISONE 20 MG PO TABS: 60.0000 mg | ORAL_TABLET | Freq: Every day | ORAL | Status: DC

## 2013-03-12 MED ORDER — LEVOFLOXACIN 750 MG PO TABS: 750.0000 mg | ORAL_TABLET | Freq: Every day | ORAL | Status: DC

## 2013-03-12 MED ORDER — DIAZEPAM 5 MG PO TABS: 5.0000 mg | ORAL_TABLET | Freq: Two times a day (BID) | ORAL | Status: DC

## 2013-03-12 MED ORDER — DIAZEPAM 5 MG PO TABS
5.0000 mg | ORAL_TABLET | Freq: Once | ORAL | Status: AC
  Administered 2013-03-12: 5 mg via ORAL
  Filled 2013-03-12: qty 1

## 2013-03-12 NOTE — ED Notes (Signed)
Patient reports that she began having flu-like symptoms 10 days ago. Patient states she saw her PCP 4 days ago and was prescribed Prednisone and cough syrup. Patient states she has been taking her own neb treatments at home, but no better.

## 2013-03-12 NOTE — ED Provider Notes (Signed)
CSN: [id number]     Arrival date & time 03/12/13  1628 History   First MD Initiated Contact with Patient 03/12/13 1658     Chief Complaint  Patient presents with  . Wheezing  . Shortness of Breath   (Consider location/radiation/quality/duration/timing/severity/associated sxs/prior Treatment) Patient is a 37 y.o. female presenting with wheezing and shortness of breath. The history is provided by the patient. No language interpreter was used.  Wheezing Associated symptoms: chest tightness, cough and shortness of breath   Associated symptoms: no fever   Shortness of Breath Associated symptoms: cough and wheezing   Associated symptoms: no fever, no neck pain and no vomiting   Pt is a 37 year old female who presents today with wheezing that worsened this afternoon. She reports that she has felt achy and been having flu-like symptoms and a cough for about the last 10 days. She is unsure if she has had fever. She reports that she saw her PCP on Wed, got some prednisone and cough syrup and then felt better Thursday-Saturday but when she woke up today her wheezing has worsened and she had two treatments at home without much relief. She reports that she feels irritated and sore when she takes a deep breath. She was prescribed prednisone 20mg /day for 5 days but she reports that she has missed a couple doses because she doesn't like the way it makes her feel. She sees [facility] and she says that they have been working with her to use her rescue inhaler and nebs less frequently. She takes dulera every day and has been for a while. She reports that she has seasonal allergies and has been outdoors some in the evening hours. She denies nausea, vomiting, diarrhea or recent sick exposure. She denies a history of DVT, PE and has had a tubal ligation in the past. She has been a smoker since she was 37 years old, she reports that she smokes anywhere from 2-5 cigarettes/day depending on if she is working or at  home. She says that she has not smoked in the last 2 days because she has not felt well.   Past Medical History  Diagnosis Date  . Asthma   . Migraine   . Obesity   . Thyroid disease   . Sleep apnea   . Fibromyalgia   . Anxiety   . GERD (gastroesophageal reflux disease)   . Fracture of right foot   . Anxiety    Past Surgical History  Procedure Laterality Date  . Tubal ligation    . Nasal septum surgery     Family History  Problem Relation Age of Onset  . Hypertension Mother   . Hypertension Father   . Diabetes Sister    History  Substance Use Topics  . Smoking status: Current Some Day Smoker -- 0.50 packs/day for 20 years    Types: Cigarettes  . Smokeless tobacco: Never Used  . Alcohol Use: No   OB History   Grav Para Term Preterm Abortions TAB SAB Ect Mult Living                 Review of Systems  Constitutional: Negative for fever and chills.  Respiratory: Positive for cough, chest tightness, shortness of breath and wheezing.   Gastrointestinal: Negative for nausea, vomiting and diarrhea.  Musculoskeletal: Negative for neck pain and neck stiffness.  All other systems reviewed and are negative.    Allergies  Beta adrenergic blockers  Home Medications   Current Outpatient Rx  Name  Route  Sig  Dispense  Refill  . albuterol (PROAIR HFA) 108 (90 BASE) MCG/ACT inhaler   Inhalation   Inhale 2 puffs into the lungs every 6 (six) hours as needed for wheezing. 2 puffs every 4 hours as needed only  if your can't catch your breath   1 Inhaler   0   . albuterol (PROVENTIL) (5 MG/ML) 0.5% nebulizer solution   Nebulization   Take 2.5 mg by nebulization every 6 (six) hours as needed. Shortness of breath         . calcium carbonate (TUMS EX) 750 MG chewable tablet   Oral   Chew 1 tablet by mouth 2 (two) times daily.         . famotidine (PEPCID) 20 MG tablet      One at bedtime   30 tablet   11   . ibuprofen (ADVIL,MOTRIN) 400 MG tablet   Oral   Take  1 tablet (400 mg total) by mouth every 8 (eight) hours as needed for pain.   20 tablet   0   . LORazepam (ATIVAN) 1 MG tablet   Oral   Take 1 mg by mouth every 6 (six) hours as needed for anxiety.         . mometasone-formoterol (DULERA) 200-5 MCG/ACT AERO      Take 2 puffs first thing in am and then another 2 puffs about 12 hours later.   1 Inhaler   11   . predniSONE (DELTASONE) 20 MG tablet   Oral   Take 20 mg by mouth daily with breakfast.         . pregabalin (LYRICA) 225 MG capsule   Oral   Take 225 mg by mouth daily.         . traMADol (ULTRAM) 50 MG tablet   Oral   Take 50 mg by mouth every 6 (six) hours as needed for pain.         . diazepam (VALIUM) 5 MG tablet   Oral   Take 1 tablet (5 mg total) by mouth 2 (two) times daily.   10 tablet   0   . levofloxacin (LEVAQUIN) 750 MG tablet   Oral   Take 1 tablet (750 mg total) by mouth daily.   5 tablet   0   . predniSONE (DELTASONE) 20 MG tablet   Oral   Take 3 tablets (60 mg total) by mouth daily.   9 tablet   0    BP 150/80  Pulse 80  Temp(Src) 98.2 F (36.8 C) (Oral)  Resp 20  SpO2 100%  LMP 03/12/2013 Physical Exam  Nursing note and vitals reviewed. Constitutional: She is oriented to person, place, and time. She appears well-developed and well-nourished. No distress.  HENT:  Head: Normocephalic and atraumatic.  Right Ear: Tympanic membrane is not erythematous and not retracted.  Left Ear: Tympanic membrane is not erythematous and not retracted.  Mouth/Throat: Uvula is midline. Posterior oropharyngeal erythema present. No oropharyngeal exudate.  Bilateral TM's with clear fluid. Mild oropharyngeal erythema. Tonsils unremarkable.  Eyes: Conjunctivae and EOM are normal. Pupils are equal, round, and reactive to light. Right eye exhibits no discharge. Left eye exhibits no discharge.  Neck: Normal range of motion. Neck supple. No JVD present. No tracheal deviation present. No thyromegaly present.   Cardiovascular: Normal rate, regular rhythm and normal heart sounds.   Pulmonary/Chest: Effort normal. No accessory muscle usage. Not tachypneic. No respiratory distress.  No apparent distress,  speaking in full sentences. No wheezes heard but diminished bilaterally in the bases.  Abdominal: Soft. Bowel sounds are normal. She exhibits no distension. There is no tenderness.  Musculoskeletal: Normal range of motion.  Lymphadenopathy:    She has no cervical adenopathy.  Neurological: She is alert and oriented to person, place, and time.  Skin: Skin is warm and dry. No rash noted.  Psychiatric: She has a normal mood and affect. Her behavior is normal. Judgment and thought content normal.    ED Course  Procedures (including critical care time) Labs Review Labs Reviewed  CBC WITH DIFFERENTIAL - Abnormal; Notable for the following:    WBC 17.2 (*)    Neutrophils Relative % 92 (*)    Neutro Abs 15.8 (*)    Lymphocytes Relative 5 (*)    All other components within normal limits  POCT I-STAT, CHEM 8 - Abnormal; Notable for the following:    Glucose, Bld 129 (*)    All other components within normal limits   Imaging Review Dg Chest 2 View  03/12/2013   CLINICAL DATA:  Wheezing. Shortness of breath. Asthma.  EXAM: CHEST  2 VIEW  COMPARISON:  12/02/2012.  FINDINGS: Normal sized heart. Patchy airspace opacity at the lateral right lung base. This appears to be in the anterior aspect of the right lower lobe on the lateral view. There is a probable associated small right pleural effusion. Clear left lung. Stable mild diffuse peribronchial thickening. Mild thoracic spine degenerative changes.  IMPRESSION: 1. Right lower lobe pneumonia or patchy atelectasis. 2. Probable small right pleural effusion. 3. Stable mild chronic bronchitic changes.   Electronically Signed   By: Gordan Payment M.D.   On: 03/12/2013 18:18    EKG Interpretation   None       MDM   1. Community acquired pneumonia   2. Asthma  exacerbation     Cough, subjective fever, body aches and wheezing for the last approx 10 days. WBC's 17.2. Chest x-ray, right lower lobe pneumonia. VS stable during visit, no hypoxia or tachycardia, SPO2>96% on room air. Felt better after neb treatments and prednisone here. Home with Levaquin and burst dose of prednisone for the next 3 days. Close follow-up with PCP. Return if symptoms worsen. Pt understands plan and agrees.     Irish Elders, NP 03/12/13 [phone]

## 2013-03-14 NOTE — ED Provider Notes (Signed)
Medical screening examination/treatment/procedure(s) were performed by non-physician practitioner and as supervising physician I was immediately available for consultation/collaboration.  EKG Interpretation   None         Nissi Doffing L Jahmad Petrich, MD 03/14/13 0952 

## 2013-03-22 ENCOUNTER — Emergency Department (HOSPITAL_COMMUNITY): Payer: Medicaid Other

## 2013-03-22 ENCOUNTER — Emergency Department (HOSPITAL_COMMUNITY)
Admission: EM | Admit: 2013-03-22 | Discharge: 2013-03-22 | Disposition: A | Discharge status: Home / Self Care | Payer: Medicaid Other | Attending: Emergency Medicine | Admitting: Emergency Medicine

## 2013-03-22 ENCOUNTER — Encounter (HOSPITAL_COMMUNITY): Payer: Self-pay | Admitting: Emergency Medicine

## 2013-03-22 DIAGNOSIS — K219 Gastro-esophageal reflux disease without esophagitis: Secondary | ICD-10-CM | POA: Insufficient documentation

## 2013-03-22 DIAGNOSIS — J019 Acute sinusitis, unspecified: Secondary | ICD-10-CM | POA: Insufficient documentation

## 2013-03-22 DIAGNOSIS — J45901 Unspecified asthma with (acute) exacerbation: Secondary | ICD-10-CM | POA: Insufficient documentation

## 2013-03-22 DIAGNOSIS — G43909 Migraine, unspecified, not intractable, without status migrainosus: Secondary | ICD-10-CM | POA: Insufficient documentation

## 2013-03-22 DIAGNOSIS — E669 Obesity, unspecified: Secondary | ICD-10-CM | POA: Insufficient documentation

## 2013-03-22 DIAGNOSIS — F411 Generalized anxiety disorder: Secondary | ICD-10-CM | POA: Insufficient documentation

## 2013-03-22 DIAGNOSIS — Z8739 Personal history of other diseases of the musculoskeletal system and connective tissue: Secondary | ICD-10-CM | POA: Insufficient documentation

## 2013-03-22 DIAGNOSIS — IMO0002 Reserved for concepts with insufficient information to code with codable children: Secondary | ICD-10-CM | POA: Insufficient documentation

## 2013-03-22 DIAGNOSIS — Z79899 Other long term (current) drug therapy: Secondary | ICD-10-CM | POA: Insufficient documentation

## 2013-03-22 DIAGNOSIS — Z8781 Personal history of (healed) traumatic fracture: Secondary | ICD-10-CM | POA: Insufficient documentation

## 2013-03-22 DIAGNOSIS — F17200 Nicotine dependence, unspecified, uncomplicated: Secondary | ICD-10-CM | POA: Insufficient documentation

## 2013-03-22 DIAGNOSIS — J069 Acute upper respiratory infection, unspecified: Secondary | ICD-10-CM | POA: Insufficient documentation

## 2013-03-22 MED ORDER — PREDNISONE 20 MG PO TABS
40.0000 mg | ORAL_TABLET | Freq: Once | ORAL | Status: AC
  Administered 2013-03-22: 40 mg via ORAL
  Filled 2013-03-22: qty 2

## 2013-03-22 MED ORDER — ALBUTEROL SULFATE (2.5 MG/3ML) 0.083% IN NEBU: 2.5000 mg | INHALATION_SOLUTION | Freq: Four times a day (QID) | RESPIRATORY_TRACT | Status: DC | PRN

## 2013-03-22 MED ORDER — HYDROCODONE-HOMATROPINE 5-1.5 MG/5ML PO SYRP: 5.0000 mL | ORAL_SOLUTION | Freq: Four times a day (QID) | ORAL | Status: DC | PRN

## 2013-03-22 MED ORDER — ALBUTEROL SULFATE (5 MG/ML) 0.5% IN NEBU
INHALATION_SOLUTION | RESPIRATORY_TRACT | Status: AC
  Filled 2013-03-22: qty 0.5

## 2013-03-22 MED ORDER — IPRATROPIUM BROMIDE 0.02 % IN SOLN
0.5000 mg | Freq: Once | RESPIRATORY_TRACT | Status: AC
  Administered 2013-03-22: 0.5 mg via RESPIRATORY_TRACT
  Filled 2013-03-22: qty 2.5

## 2013-03-22 MED ORDER — ALBUTEROL SULFATE (5 MG/ML) 0.5% IN NEBU
5.0000 mg | INHALATION_SOLUTION | Freq: Once | RESPIRATORY_TRACT | Status: AC
  Administered 2013-03-22: 5 mg via RESPIRATORY_TRACT
  Filled 2013-03-22: qty 1

## 2013-03-22 MED ORDER — AZITHROMYCIN 250 MG PO TABS: 250.0000 mg | ORAL_TABLET | Freq: Every day | ORAL | Status: DC

## 2013-03-22 MED ORDER — ALBUTEROL SULFATE HFA 108 (90 BASE) MCG/ACT IN AERS: 1.0000 | INHALATION_SPRAY | Freq: Four times a day (QID) | RESPIRATORY_TRACT | Status: DC | PRN

## 2013-03-22 MED ORDER — FLUTICASONE PROPIONATE 50 MCG/ACT NA SUSP: 2.0000 | Freq: Every day | NASAL | Status: DC

## 2013-03-22 MED ORDER — HYDROCODONE-ACETAMINOPHEN 5-325 MG PO TABS
2.0000 | ORAL_TABLET | Freq: Once | ORAL | Status: AC
  Administered 2013-03-22: 2 via ORAL
  Filled 2013-03-22: qty 2

## 2013-03-22 MED ORDER — ALBUTEROL SULFATE (5 MG/ML) 0.5% IN NEBU
2.5000 mg | INHALATION_SOLUTION | Freq: Once | RESPIRATORY_TRACT | Status: AC
  Administered 2013-03-22: 2.5 mg via RESPIRATORY_TRACT

## 2013-03-22 NOTE — ED Provider Notes (Signed)
Medical screening examination/treatment/procedure(s) were performed by non-physician practitioner and as supervising physician I was immediately available for consultation/collaboration.  Quincey Quesinberry L Akyra Bouchie, MD 03/22/13 2338 

## 2013-03-22 NOTE — ED Notes (Signed)
Patient reports that she was diagnosed with pneumonia on 03/12/13 and was prescribed antibiotics x 5 days. Patient continues to have a non productive cough and yellow sinus drainage. Patient also c/o body aches. Patient denies any fever. Patient states she had neb treatments x 2 and Albuterol inhaler at home with no relief. Lung sounds diminished.

## 2013-03-22 NOTE — ED Provider Notes (Signed)
CSN: [id number]     Arrival date & time 03/22/13  1654 History   First MD Initiated Contact with Patient 03/22/13 1718     Chief Complaint  Patient presents with  . Shortness of Breath   (Consider location/radiation/quality/duration/timing/severity/associated sxs/prior Treatment) HPI Comments: Patient with a history of Asthma presents with a chief complaint of nasal congestion, SOB, wheezing, cough, and sinus pressure.  She reports that her symptoms have been present for the past 3 weeks.  Patient was seen in the ED ten days ago for the same and had a CXR at that time, which showed Pneumonia.  She was stared on a five day course of Levaquin, which she completed.  She also reports that she has been taking Prednisone 20 mg daily.  She also states that her PCP prescribed her a cough medication containing Codeine, which she reports has helped somewhat. She has also given herself two breathing treatments today, which give her mild relief.  She denies fever or chills.  Denies nausea, vomiting, or diarrhea.  Patient is a 37 y.o. female presenting with shortness of breath. The history is provided by the patient.  Shortness of Breath Associated symptoms: cough and wheezing     Past Medical History  Diagnosis Date  . Asthma   . Migraine   . Obesity   . Thyroid disease   . Sleep apnea   . Fibromyalgia   . Anxiety   . GERD (gastroesophageal reflux disease)   . Fracture of right foot   . Anxiety    Past Surgical History  Procedure Laterality Date  . Tubal ligation    . Nasal septum surgery     Family History  Problem Relation Age of Onset  . Hypertension Mother   . Hypertension Father   . Diabetes Sister    History  Substance Use Topics  . Smoking status: Current Some Day Smoker -- 0.50 packs/day for 20 years    Types: Cigarettes  . Smokeless tobacco: Never Used  . Alcohol Use: No   OB History   Grav Para Term Preterm Abortions TAB SAB Ect Mult Living                 Review of  Systems  HENT: Positive for congestion, rhinorrhea and sinus pressure.   Respiratory: Positive for cough, shortness of breath and wheezing.   Musculoskeletal: Positive for myalgias.  All other systems reviewed and are negative.    Allergies  Beta adrenergic blockers  Home Medications   Current Outpatient Rx  Name  Route  Sig  Dispense  Refill  . albuterol (PROAIR HFA) 108 (90 BASE) MCG/ACT inhaler   Inhalation   Inhale 2 puffs into the lungs every 6 (six) hours as needed for wheezing. 2 puffs every 4 hours as needed only  if your can't catch your breath   1 Inhaler   0   . albuterol (PROVENTIL) (5 MG/ML) 0.5% nebulizer solution   Nebulization   Take 2.5 mg by nebulization every 6 (six) hours as needed. Shortness of breath         . calcium carbonate (TUMS EX) 750 MG chewable tablet   Oral   Chew 1 tablet by mouth 2 (two) times daily.         . diazepam (VALIUM) 5 MG tablet   Oral   Take 1 tablet (5 mg total) by mouth 2 (two) times daily.   10 tablet   0   . famotidine (PEPCID) 20 MG tablet  One at bedtime   30 tablet   11   . ibuprofen (ADVIL,MOTRIN) 400 MG tablet   Oral   Take 1 tablet (400 mg total) by mouth every 8 (eight) hours as needed for pain.   20 tablet   0   . LORazepam (ATIVAN) 1 MG tablet   Oral   Take 1 mg by mouth every 6 (six) hours as needed for anxiety.         . mometasone-formoterol (DULERA) 200-5 MCG/ACT AERO      Take 2 puffs first thing in am and then another 2 puffs about 12 hours later.   1 Inhaler   11   . predniSONE (DELTASONE) 20 MG tablet   Oral   Take 20 mg by mouth daily with breakfast.         . pregabalin (LYRICA) 225 MG capsule   Oral   Take 225 mg by mouth daily.          BP 112/39  Pulse 84  Temp(Src) 97.7 F (36.5 C) (Oral)  Resp 22  SpO2 99%  LMP 03/12/2013 Physical Exam  Nursing note and vitals reviewed. Constitutional: She appears well-developed and well-nourished.  HENT:  Head:  Normocephalic and atraumatic.  Right Ear: Tympanic membrane and ear canal normal.  Left Ear: Tympanic membrane and ear canal normal.  Nose: Mucosal edema and rhinorrhea present.  Mouth/Throat: Uvula is midline, oropharynx is clear and moist and mucous membranes are normal.  Neck: Normal range of motion. Neck supple.  Cardiovascular: Normal rate, regular rhythm and normal heart sounds.   Pulmonary/Chest: Effort normal. No accessory muscle usage. Not tachypneic. She has decreased breath sounds. She has wheezes.  Patient speaking in complete sentences  Musculoskeletal: Normal range of motion.  Neurological: She is alert.  Skin: Skin is warm and dry.  Psychiatric: She has a normal mood and affect.    ED Course  Procedures (including critical care time) Labs Review Labs Reviewed - No data to display Imaging Review Dg Chest 2 View  03/22/2013   CLINICAL DATA:  Chest pain and body aches.  EXAM: CHEST  2 VIEW  COMPARISON:  03/12/2013.  FINDINGS: The cardiac silhouette, mediastinal and hilar contours are within normal limits and stable. Slight improved right lung base aeration suggesting resolving pneumonia. No pleural effusion or pulmonary edema.  IMPRESSION: Resolving right lower lobe infiltrate.   Electronically Signed   By: Loralie Champagne M.D.   On: 03/22/2013 18:49    EKG Interpretation   None      7:24 PM Reassessed patient after the second breathing treatment.  She reports that her shortness of breath has improved.  Patient continues to have diffuse expiratory wheezing on exam.  9:04 PM Reassessed patient.  She reports that her breathing is much better.  Lungs CTAB. MDM  No diagnosis found. Patient presents with a chief complaint of nasal congestion, cough, sinus pressure, SOB, and wheezing.  Symptoms have been present for the past 3 weeks.  Patient currently on Prednisone.  CXR shows resolving right lower lobe infiltrate.  Patient given breathing treatments in the ED with  significant improvement in symptoms.  Vital signs stable.  Patient is not hypoxic.  Feel that the patient is stable for discharge.  She states that she has a follow up appointment scheduled with her PCP.  Return precautions given.    Santiago Glad, PA-C 03/22/13 2118

## 2013-03-22 NOTE — ED Notes (Signed)
As pt ambulated to room observed drinking coke.

## 2013-03-22 NOTE — ED Notes (Signed)
Pt reports relief with breathing after last neb treatment.

## 2013-03-26 ENCOUNTER — Ambulatory Visit: Payer: Medicaid Other | Admitting: Internal Medicine

## 2013-04-23 ENCOUNTER — Telehealth: Payer: Self-pay | Admitting: Internal Medicine

## 2013-04-23 MED ORDER — MOMETASONE FURO-FORMOTEROL FUM 200-5 MCG/ACT IN AERO: INHALATION_SPRAY | RESPIRATORY_TRACT | Status: DC

## 2013-04-23 NOTE — Telephone Encounter (Signed)
Pt requested refill on dulera 200 inhaler, is completely out.  Have sent this to requested pharm. Nothing further is needed

## 2013-04-24 ENCOUNTER — Other Ambulatory Visit: Payer: Self-pay | Admitting: Internal Medicine

## 2013-04-24 DIAGNOSIS — J45909 Unspecified asthma, uncomplicated: Secondary | ICD-10-CM

## 2013-05-01 ENCOUNTER — Ambulatory Visit (INDEPENDENT_AMBULATORY_CARE_PROVIDER_SITE_OTHER): Payer: Medicaid Other | Admitting: Internal Medicine

## 2013-05-01 ENCOUNTER — Encounter: Payer: Self-pay | Admitting: Internal Medicine

## 2013-05-01 VITALS — BP 130/88 | HR 98 | Temp 98.3°F | Ht 65.0 in | Wt 346.0 lb

## 2013-05-01 DIAGNOSIS — J45909 Unspecified asthma, uncomplicated: Secondary | ICD-10-CM

## 2013-05-01 DIAGNOSIS — F17200 Nicotine dependence, unspecified, uncomplicated: Secondary | ICD-10-CM

## 2013-05-01 LAB — PULMONARY FUNCTION TEST
DL/VA % pred: 83 %
DL/VA: 4.1 ml/min/mmHg/L
DLCO unc % pred: 77 %
FEF 25-75 Post: 2.97 L/sec
FEF 25-75 Pre: 2.49 L/sec
FEF2575-%Pred-Post: 90 %
FEF2575-%Pred-Pre: 76 %
FEV1-Post: 2.83 L
FEV1FVC-%Change-Post: 1 %
FEV1FVC-%Pred-Pre: 95 %
FEV6-%Change-Post: 3 %
FEV6-%Pred-Pre: 89 %
FEV6-Post: 3.52 L
FEV6FVC-%Pred-Post: 102 %
FVC-%Change-Post: 3 %
FVC-%Pred-Post: 91 %
FVC-%Pred-Pre: 88 %
FVC-Pre: 3.4 L
Post FEV6/FVC ratio: 100 %
Pre FEV1/FVC ratio: 79 %
Pre FEV6/FVC Ratio: 100 %

## 2013-05-01 MED ORDER — ALBUTEROL SULFATE HFA 108 (90 BASE) MCG/ACT IN AERS: 2.0000 | INHALATION_SPRAY | Freq: Four times a day (QID) | RESPIRATORY_TRACT | Status: DC | PRN

## 2013-05-01 NOTE — Progress Notes (Signed)
Subjective:    Patient ID: Cindy Hoffman, female    DOB: 19-Apr-1977MRN: [id number]    Brief patient profile:  37 yowf active smoker never had good ex tol child dx as asthma but "no need" for maint inhaler until 20s worse with pregnancy age 37 and on rescue inhaler at least once a day since then and failed qvar, singulair best on dulera but still needing once day saba requesting trx to [facility] from Dr Blenda Nicely in [place] due to convenience as lives in [place] so referred to pulmonary by Virl Son on 01/30/13 with nl pft's while on maint dulera 200   05/01/2013    History of Present Illness  01/30/2013 1st [facility] office visit/ Cindy Hoffman cc daily need for saba x years during the day but sleeps well on cpap s noct wheeze or resp disturbance.  Very sedentary but not typically limited by breathing as long as stays on dulera and has access to saba and no recent flares. rec Change dulera 200 Take 2 puffs first thing in am and then another 2 puffs about 12 hours later.  Only use your albuterol as a rescue medication   Please schedule a follow up office visit in 4 weeks, sooner if needed with bring all medications with you    02/26/2013 f/u ov/Cindy Hoffman active smoker re: asthma Chief Complaint  Patient presents with  . Follow-up    Pt states that her SOB is some better- using rescue inhaler twice per day. She has not needed to use her neb.    Once a week wake up and need saba but mostly needs it during the day"if over does it" rec Work on inhaler technique:   Only use your albuterol as a rescue medication  The key is to stop smoking completely before smoking completely stops you!  Pepcid  20 mg one at bedtime GERD diet   05/01/2013 f/u ov/Cindy Hoffman re: asthma Chief Complaint  Patient presents with  . Follow-up    PFT done today.  Breathing improved since last OV.  Needs saba qod now whereas prev daily and only using the hfa, no more neb or noct spells, just with over  exertion.    No obvious day to day or daytime variabilty or assoc chronic cough or cp or chest tightness, subjective wheeze overt sinus or hb symptoms. No unusual exp hx or h/o childhood pna/ asthma or knowledge of premature birth.  Sleeping ok without nocturnal  or early am exacerbation  of respiratory  c/o's or need for noct saba. Also denies any obvious fluctuation of symptoms with weather or environmental changes or other aggravating or alleviating factors except as outlined above   Current Medications, Allergies, Complete Past Medical History, Past Surgical History, Family History, and Social History were reviewed in [employer] record.  ROS  The following are not active complaints unless bolded sore throat, dysphagia, dental problems, itching, sneezing,  nasal congestion or excess/ purulent secretions, ear ache,   fever, chills, sweats, unintended wt loss, pleuritic or exertional cp, hemoptysis,  orthopnea pnd or leg swelling, presyncope, palpitations, heartburn, abdominal pain, anorexia, nausea, vomiting, diarrhea  or change in bowel or urinary habits, change in stools or urine, dysuria,hematuria,  rash, arthralgias, visual complaints, headache, numbness weakness or ataxia or problems with walking or coordination,  change in mood/affect or memory.                 Objective:   Physical Exam  Pleasant obese amb wf  nad  05/01/2013      347  Wt Readings from Last 3 Encounters:  02/26/13 342 lb (155.13 kg)  01/30/13 339 lb (153.769 kg)      HEENT: nl dentition, turbinates, and orophanx. Nl external ear canals without cough reflex   NECK :  without JVD/Nodes/TM/ nl carotid upstrokes bilaterally   LUNGS: no acc muscle use, clear to A and P bilaterally without cough on insp or exp maneuvers   CV:  RRR  no s3 or murmur or increase in P2, no edema   ABD:  soft and nontender with nl excursion in the supine position. No bruits or organomegaly, bowel sounds  nl  MS:  warm without deformities, calf tenderness, cyanosis or clubbing  SKIN: warm and dry without lesions    NEURO:  alert, approp, no deficits    12/02/12 cxr wnl     Assessment & Plan:

## 2013-05-01 NOTE — Assessment & Plan Note (Addendum)
-   PFT's 05/01/2013 wnl including fef 25-75 p am dulera 200    Her main problem appears to be obesity -related (see separate a/p) not limiting airflow obstruction  rec she only use saba when can't catch her breath by other means  No need for regular pulmonar f/u > reviewed when to call for appt

## 2013-05-01 NOTE — Progress Notes (Signed)
PFT done today. 

## 2013-05-01 NOTE — Assessment & Plan Note (Signed)
pfts with ERV = 4   Discussed implications for longterm rx and risk for other complications like gerd   Defer rx to primary care

## 2013-05-01 NOTE — Assessment & Plan Note (Signed)
pfts s residual a/f obst so does not have copd / just asthma ? Difficult to control vs obesity related doe  > 3 min discussion  I emphasized that although we never turn away smokers from the pulmonary clinic, we do ask that they understand that the recommendations that we make  won't work nearly as well in the presence of continued cigarette exposure.  In fact, we may very well  reach a point where we can't promise to help the patient if he/she can't quit smoking. (We can and will promise to try to help, we just can't promise what we recommend will really work)

## 2013-05-01 NOTE — Patient Instructions (Addendum)
The key is to stop smoking completely before smoking completely stops you - it's not too late as you do not have any permanent lung damage at this point  You are ok for surgery if needed but would need to be done in [place] with bipap bridge   Your weight is the main problem with your breathing so pace yourself and only use the albuterol if you can't catch your breath at rest

## 2013-05-13 ENCOUNTER — Emergency Department (HOSPITAL_COMMUNITY)
Admission: EM | Admit: 2013-05-13 | Discharge: 2013-05-13 | Disposition: A | Discharge status: Home / Self Care | Payer: Medicaid Other | Attending: Emergency Medicine | Admitting: Emergency Medicine

## 2013-05-13 ENCOUNTER — Emergency Department (HOSPITAL_COMMUNITY): Payer: Medicaid Other

## 2013-05-13 ENCOUNTER — Encounter (HOSPITAL_COMMUNITY): Payer: Self-pay | Admitting: Emergency Medicine

## 2013-05-13 DIAGNOSIS — K219 Gastro-esophageal reflux disease without esophagitis: Secondary | ICD-10-CM | POA: Insufficient documentation

## 2013-05-13 DIAGNOSIS — IMO0002 Reserved for concepts with insufficient information to code with codable children: Secondary | ICD-10-CM | POA: Insufficient documentation

## 2013-05-13 DIAGNOSIS — J45901 Unspecified asthma with (acute) exacerbation: Secondary | ICD-10-CM | POA: Insufficient documentation

## 2013-05-13 DIAGNOSIS — E669 Obesity, unspecified: Secondary | ICD-10-CM | POA: Insufficient documentation

## 2013-05-13 DIAGNOSIS — Z8739 Personal history of other diseases of the musculoskeletal system and connective tissue: Secondary | ICD-10-CM | POA: Insufficient documentation

## 2013-05-13 DIAGNOSIS — Z8781 Personal history of (healed) traumatic fracture: Secondary | ICD-10-CM | POA: Insufficient documentation

## 2013-05-13 DIAGNOSIS — Z8679 Personal history of other diseases of the circulatory system: Secondary | ICD-10-CM | POA: Insufficient documentation

## 2013-05-13 DIAGNOSIS — F411 Generalized anxiety disorder: Secondary | ICD-10-CM | POA: Insufficient documentation

## 2013-05-13 DIAGNOSIS — J069 Acute upper respiratory infection, unspecified: Secondary | ICD-10-CM | POA: Insufficient documentation

## 2013-05-13 DIAGNOSIS — F17200 Nicotine dependence, unspecified, uncomplicated: Secondary | ICD-10-CM | POA: Insufficient documentation

## 2013-05-13 DIAGNOSIS — Z720 Tobacco use: Secondary | ICD-10-CM

## 2013-05-13 DIAGNOSIS — Z79899 Other long term (current) drug therapy: Secondary | ICD-10-CM | POA: Insufficient documentation

## 2013-05-13 LAB — CBC WITH DIFFERENTIAL/PLATELET
BASOS PCT: 0 % (ref 0–1)
Basophils Absolute: 0 10*3/uL (ref 0.0–0.1)
Eosinophils Absolute: 0.2 10*3/uL (ref 0.0–0.7)
Eosinophils Relative: 2 % (ref 0–5)
HEMATOCRIT: 37.9 % (ref 36.0–46.0)
Hemoglobin: 12.7 g/dL (ref 12.0–15.0)
Lymphocytes Relative: 7 % — ABNORMAL LOW (ref 12–46)
Lymphs Abs: 1 10*3/uL (ref 0.7–4.0)
MCH: 27.9 pg (ref 26.0–34.0)
MCHC: 33.5 g/dL (ref 30.0–36.0)
MCV: 83.1 fL (ref 78.0–100.0)
MONO ABS: 0.5 10*3/uL (ref 0.1–1.0)
MONOS PCT: 4 % (ref 3–12)
NEUTROS ABS: 11.5 10*3/uL — AB (ref 1.7–7.7)
Neutrophils Relative %: 87 % — ABNORMAL HIGH (ref 43–77)
Platelets: 308 10*3/uL (ref 150–400)
RBC: 4.56 MIL/uL (ref 3.87–5.11)
RDW: 15.1 % (ref 11.5–15.5)
WBC: 13.2 10*3/uL — ABNORMAL HIGH (ref 4.0–10.5)

## 2013-05-13 LAB — COMPREHENSIVE METABOLIC PANEL
ALT: 12 U/L (ref 0–35)
AST: 10 U/L (ref 0–37)
Albumin: 3.2 g/dL — ABNORMAL LOW (ref 3.5–5.2)
Alkaline Phosphatase: 86 U/L (ref 39–117)
BUN: 13 mg/dL (ref 6–23)
CO2: 24 meq/L (ref 19–32)
CREATININE: 0.61 mg/dL (ref 0.50–1.10)
Calcium: 8.7 mg/dL (ref 8.4–10.5)
Chloride: 103 mEq/L (ref 96–112)
GFR calc Af Amer: >90 mL/min (ref >90)
Glucose, Bld: 122 mg/dL — ABNORMAL HIGH (ref 70–99)
Potassium: 3.9 mEq/L (ref 3.7–5.3)
Sodium: 138 mEq/L (ref 137–147)
Total Bilirubin: <0.2 mg/dL — ABNORMAL LOW (ref 0.3–1.2)
Total Protein: 7.3 g/dL (ref 6.0–8.3)

## 2013-05-13 MED ORDER — PREDNISONE 20 MG PO TABS: 40.0000 mg | ORAL_TABLET | Freq: Every day | ORAL | Status: DC

## 2013-05-13 MED ORDER — ALBUTEROL SULFATE (2.5 MG/3ML) 0.083% IN NEBU
INHALATION_SOLUTION | RESPIRATORY_TRACT | Status: AC
  Filled 2013-05-13: qty 6

## 2013-05-13 MED ORDER — ALBUTEROL SULFATE (2.5 MG/3ML) 0.083% IN NEBU
5.0000 mg | INHALATION_SOLUTION | Freq: Once | RESPIRATORY_TRACT | Status: AC
  Administered 2013-05-13: 5 mg via RESPIRATORY_TRACT

## 2013-05-13 MED ORDER — IPRATROPIUM-ALBUTEROL 0.5-2.5 (3) MG/3ML IN SOLN
3.0000 mL | Freq: Once | RESPIRATORY_TRACT | Status: AC
  Administered 2013-05-13: 3 mL via RESPIRATORY_TRACT
  Filled 2013-05-13: qty 3

## 2013-05-13 MED ORDER — PREDNISONE 20 MG PO TABS
30.0000 mg | ORAL_TABLET | Freq: Once | ORAL | Status: AC
  Administered 2013-05-13: 30 mg via ORAL
  Filled 2013-05-13: qty 2

## 2013-05-13 MED ORDER — OXYCODONE-ACETAMINOPHEN 5-325 MG PO TABS
2.0000 | ORAL_TABLET | Freq: Once | ORAL | Status: AC
  Administered 2013-05-13: 2 via ORAL
  Filled 2013-05-13: qty 2

## 2013-05-13 MED ORDER — ALBUTEROL (5 MG/ML) CONTINUOUS INHALATION SOLN
10.0000 mg/h | INHALATION_SOLUTION | Freq: Once | RESPIRATORY_TRACT | Status: AC
  Administered 2013-05-13: 10 mg/h via RESPIRATORY_TRACT
  Filled 2013-05-13: qty 20

## 2013-05-13 MED ORDER — IPRATROPIUM BROMIDE 0.03 % NA SOLN: 2.0000 | Freq: Two times a day (BID) | NASAL | Status: DC

## 2013-05-13 MED ORDER — LORAZEPAM 1 MG PO TABS
1.0000 mg | ORAL_TABLET | Freq: Once | ORAL | Status: AC
  Administered 2013-05-13: 1 mg via ORAL
  Filled 2013-05-13: qty 1

## 2013-05-13 MED ORDER — HYDROCODONE-ACETAMINOPHEN 5-325 MG PO TABS: 1.0000 | ORAL_TABLET | Freq: Four times a day (QID) | ORAL | Status: DC | PRN

## 2013-05-13 MED ORDER — ALBUTEROL SULFATE (5 MG/ML) 0.5% IN NEBU: 2.5000 mg | INHALATION_SOLUTION | Freq: Four times a day (QID) | RESPIRATORY_TRACT | Status: DC | PRN

## 2013-05-13 NOTE — ED Notes (Signed)
RT contacted about continuous Neb.

## 2013-05-13 NOTE — ED Notes (Signed)
Pt 98% RA with walking. PA notified.

## 2013-05-13 NOTE — ED Provider Notes (Signed)
CSN: [id number]     Arrival date & time 05/13/13  1036 History   First MD Initiated Contact with Patient 05/13/13 1115     Chief Complaint  Patient presents with  . Asthma  . Wheezing  . Fever   (Consider location/radiation/quality/duration/timing/severity/associated sxs/prior Treatment) HPI This is a 38 year old female with a past history of obesity, asthma, fibromyalgia, anxiety, and thyroid disease who presents the emergency department with chief complaint of asthma exacerbation.  The patient has had several days of symptoms of URI including sore throat which then progressed into her chest.  She complains of congestion, productive cough, pain with coughing, fatigue and decreased appetite.  The patient states that this morning she began having increasing wheezing, shortness of breath, dyspnea with exertion.  The patient used her albuterol nebulizer at home without relief of her symptoms and came to the emergency department for treatment.  Past Medical History  Diagnosis Date  . Asthma   . Migraine   . Obesity   . Thyroid disease   . Sleep apnea   . Fibromyalgia   . Anxiety   . GERD (gastroesophageal reflux disease)   . Fracture of right foot   . Anxiety    Past Surgical History  Procedure Laterality Date  . Tubal ligation    . Nasal septum surgery     Family History  Problem Relation Age of Onset  . Hypertension Mother   . Hypertension Father   . Diabetes Sister    History  Substance Use Topics  . Smoking status: Current Some Day Smoker -- 0.50 packs/day for 20 years    Types: Cigarettes  . Smokeless tobacco: Never Used  . Alcohol Use: No   OB History   Grav Para Term Preterm Abortions TAB SAB Ect Mult Living                 Review of Systems Ten systems reviewed and are negative for acute change, except as noted in the HPI.  Allergies  Beta adrenergic blockers  Home Medications   Current Outpatient Rx  Name  Route  Sig  Dispense  Refill  . albuterol  (PROAIR HFA) 108 (90 BASE) MCG/ACT inhaler   Inhalation   Inhale 2 puffs into the lungs every 6 (six) hours as needed for wheezing. 2 puffs every 4 hours as needed only  if your can't catch your breath   1 Inhaler   5   . albuterol (PROVENTIL) (2.5 MG/3ML) 0.083% nebulizer solution   Nebulization   Take 3 mLs (2.5 mg total) by nebulization every 6 (six) hours as needed for wheezing or shortness of breath.   75 mL   12   . calcium carbonate (TUMS EX) 750 MG chewable tablet   Oral   Chew 1 tablet by mouth 2 (two) times daily.         . GuaiFENesin (MUCINEX PO)   Oral   Take 1 capsule by mouth once.         Marland Kitchen LORazepam (ATIVAN) 1 MG tablet   Oral   Take 1 mg by mouth every 6 (six) hours as needed for anxiety.         . mometasone-formoterol (DULERA) 200-5 MCG/ACT AERO      Take 2 puffs first thing in am and then another 2 puffs about 12 hours later.   1 Inhaler   11   . Phenylephrine-Pheniramine-DM (THERAFLU COLD & COUGH PO)   Oral   Take 1 packet by mouth  once.         . Pseudoeph-Doxylamine-DM-APAP (NYQUIL PO)   Oral   Take 30 mLs by mouth once.         Marland Kitchen zolpidem (AMBIEN) 10 MG tablet   Oral   Take 10 mg by mouth at bedtime as needed for sleep.          BP 116/92  Pulse 85  Temp(Src) 98.6 F (37 C) (Oral)  Resp 20  Wt 346 lb (156.945 kg)  SpO2 98%  LMP 05/01/2013 Physical Exam Physical Exam  Nursing note and vitals reviewed. Constitutional: She is oriented to person, place, and time. She appears well-developed and well-nourished. No distress.  HENT:  Head: Normocephalic and atraumatic.  Eyes: Conjunctivae normal and EOM are normal. Pupils are equal, round, and reactive to light. No scleral icterus.  Neck: Normal range of motion.  Cardiovascular: Normal rate, regular rhythm and normal heart sounds.  Exam reveals no gallop and no friction rub.   No murmur heard. Pulmonary/Chest: Increased expiratory phase, diffuse inspiratory and expiratory  wheezes. Abdominal: Soft. Bowel sounds are normal. She exhibits no distension and no mass. There is no tenderness. There is no guarding.  Neurological: She is alert and oriented to person, place, and time.  Skin: Skin is warm and dry. She is not diaphoretic.    Procedures (including critical care time) Labs Review Labs Reviewed  CBC WITH DIFFERENTIAL - Abnormal; Notable for the following:    WBC 13.2 (*)    Neutrophils Relative % 87 (*)    Neutro Abs 11.5 (*)    Lymphocytes Relative 7 (*)    All other components within normal limits  COMPREHENSIVE METABOLIC PANEL - Abnormal; Notable for the following:    Glucose, Bld 122 (*)    Albumin 3.2 (*)    Total Bilirubin <0.2 (*)    All other components within normal limits   Imaging Review Dg Chest 2 View (if Patient Has Fever And/or Copd)  05/13/2013   CLINICAL DATA:  Asthma, wheezing, fever, chest tightness  EXAM: CHEST  2 VIEW  COMPARISON:  03/22/2013  FINDINGS: The heart size and mediastinal contours are within normal limits. Both lungs are clear. The visualized skeletal structures are unremarkable.  IMPRESSION: No active cardiopulmonary disease.   Electronically Signed   By: Ruel Favors M.D.   On: 05/13/2013 12:16    EKG Interpretation   None      .  MDM   1. Asthma exacerbation   2. URI (upper respiratory infection)   3. Tobacco abuse    2:51 PM Filed Vitals:   05/13/13 1449  BP: 119/43  Pulse: 99  Temp:   Resp: 20   Patient with leukocytosis and left shift.  She also has some slightly elevated glucose.  Patient got an 5 mg albuterol treatment prior to my evaluation.  The patient has diffuse wheezing.  I have ordered hour-long neb treatment.  The patient does report taking 30 mg of and oral prednisone prescription at home prior to arrival.  I will give her a second dose of 30 mg for a total of 60 mg today.  Patient will be reevaluated after her neb treatments and was.  He said has a history of admission for her asthma  exacerbation but denies a history of intubation.  She is a current smoker.  I did speak with, advise, and discouraged continued smoking.  3:56 PM Patient  Wheezing improved however she continues to have diffuse insp/exp wheezes.  5:11 PM Filed Vitals:   05/13/13 1500  BP: 125/69  Pulse: 102  Temp:   Resp:    Patient wheezing greatly improved after doing and.  Allowed to be discharged today.  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  The patient will be discharged with nebulizer treatments for home use.  The patient also be given prednisone, she requests a small amount of pain medication for her body aches.  Patient will followup with her primary care doctor.  Margarita Mail, PA-C 05/16/13 1926

## 2013-05-13 NOTE — Discharge Instructions (Signed)
Do not drive, operate heavy machinery, drink alcohol, or take other tylenol containing products with norco.  Asthma, Adult Asthma is a recurring condition in which the airways tighten and narrow. Asthma can make it difficult to breathe. It can cause coughing, wheezing, and shortness of breath. Asthma episodes (also called asthma attacks) range from minor to life-threatening. Asthma cannot be cured, but medicines and lifestyle changes can help control it. CAUSES Asthma is believed to be caused by inherited (genetic) and environmental factors, but its exact cause is unknown. Asthma may be triggered by allergens, lung infections, or irritants in the air. Asthma triggers are different for each person. Common triggers include:   Animal dander.  Dust mites.  Cockroaches.  Pollen from trees or grass.  Mold.  Smoke.  Air pollutants such as dust, household cleaners, hair sprays, aerosol sprays, paint fumes, strong chemicals, or strong odors.  Cold air, weather changes, and winds (which increase molds and pollens in the air).  Strong emotional expressions such as crying or laughing hard.  Stress.  Certain medicines (such as aspirin) or types of drugs (such as beta-blockers).  Sulfites in foods and drinks. Foods and drinks that may contain sulfites include dried fruit, potato chips, and sparkling grape juice.  Infections or inflammatory conditions such as the flu, a cold, or an inflammation of the nasal membranes (rhinitis).  Gastroesophageal reflux disease (GERD).  Exercise or strenuous activity. SYMPTOMS Symptoms may occur immediately after asthma is triggered or many hours later. Symptoms include:  Wheezing.  Excessive nighttime or early morning coughing.  Frequent or severe coughing with a common cold.  Chest tightness.  Shortness of breath. DIAGNOSIS  The diagnosis of asthma is made by a review of your medical history and a physical exam. Tests may also be performed. These  may include:  Lung function studies. These tests show how much air you breath in and out.  Allergy tests.  Imaging tests such as X-rays. TREATMENT  Asthma cannot be cured, but it can usually be controlled. Treatment involves identifying and avoiding your asthma triggers. It also involves medicines. There are 2 classes of medicine used for asthma treatment:   Controller medicines. These prevent asthma symptoms from occurring. They are usually taken every day.  Reliever or rescue medicines. These quickly relieve asthma symptoms. They are used as needed and provide short-term relief. Your health care provider will help you create an asthma action plan. An asthma action plan is a written plan for managing and treating your asthma attacks. It includes a list of your asthma triggers and how they may be avoided. It also includes information on when medicines should be taken and when their dosage should be changed. An action plan may also involve the use of a device called a peak flow meter. A peak flow meter measures how well the lungs are working. It helps you monitor your condition. HOME CARE INSTRUCTIONS   Take medicine as directed by your health care provider. Speak with your health care provider if you have questions about how or when to take the medicines.  Use a peak flow meter as directed by your health care provider. Record and keep track of readings.  Understand and use the action plan to help minimize or stop an asthma attack without needing to seek medical care.  Control your home environment in the following ways to help prevent asthma attacks:  Do not smoke. Avoid being exposed to secondhand smoke.  Change your heating and air conditioning filter regularly.  Limit your use of fireplaces and wood stoves.  Get rid of pests (such as roaches and mice) and their droppings.  Throw away plants if you see mold on them.  Clean your floors and dust regularly. Use unscented cleaning  products.  Try to have someone else vacuum for you regularly. Stay out of rooms while they are being vacuumed and for a short while afterward. If you vacuum, use a dust mask from a hardware store, a double-layered or microfilter vacuum cleaner bag, or a vacuum cleaner with a HEPA filter.  Replace carpet with wood, tile, or vinyl flooring. Carpet can trap dander and dust.  Use allergy-proof pillows, mattress covers, and box spring covers.  Wash bed sheets and blankets every week in hot water and dry them in a dryer.  Use blankets that are made of polyester or cotton.  Clean bathrooms and kitchens with bleach. If possible, have someone repaint the walls in these rooms with mold-resistant paint. Keep out of the rooms that are being cleaned and painted.  Wash hands frequently. SEEK MEDICAL CARE IF:   You have wheezing, shortness of breath, or a cough even if taking medicine to prevent attacks.  The colored mucus you cough up (sputum) is thicker than usual.  Your sputum changes from clear or white to yellow, green, gray, or bloody.  You have any problems that may be related to the medicines you are taking (such as a rash, itching, swelling, or trouble breathing).  You are using a reliever medicine more than 2 3 times per week.  Your peak flow is still at 50 79% of you personal best after following your action plan for 1 hour. SEEK IMMEDIATE MEDICAL CARE IF:   You seem to be getting worse and are unresponsive to treatment during an asthma attack.  You are short of breath even at rest.  You get short of breath when doing very little physical activity.  You have difficulty eating, drinking, or talking due to asthma symptoms.  You develop chest pain.  You develop a fast heartbeat.  You have a bluish color to your lips or fingernails.  You are lightheaded, dizzy, or faint.  Your peak flow is less than 50% of your personal best.  You have a fever or persistent symptoms for more  than 2 3 days.  You have a fever and symptoms suddenly get worse. MAKE SURE YOU:   Understand these instructions.  Will watch your condition.  Will get help right away if you are not doing well or get worse. Document Released: 04/19/2005 Document Revised: 12/20/2012 Document Reviewed: 11/16/2012 [facility] Patient Information 2014 [place], [place].  Asthma Attack Prevention Although there is no way to prevent asthma from starting, you can take steps to control the disease and reduce its symptoms. Learn about your asthma and how to control it. Take an active role to control your asthma by working with your health care provider to create and follow an asthma action plan. An asthma action plan guides you in:  Taking your medicines properly.  Avoiding things that set off your asthma or make your asthma worse (asthma triggers).  Tracking your level of asthma control.  Responding to worsening asthma.  Seeking emergency care when needed. To track your asthma, keep records of your symptoms, check your peak flow number using a handheld device that shows how well air moves out of your lungs (peak flow meter), and get regular asthma checkups.  WHAT ARE SOME WAYS TO PREVENT AN ASTHMA  ATTACK?  Take medicines as directed by your health care provider.  Keep track of your asthma symptoms and level of control.  With your health care provider, write a detailed plan for taking medicines and managing an asthma attack. Then be sure to follow your action plan. Asthma is an ongoing condition that needs regular monitoring and treatment.  Identify and avoid asthma triggers. Many outdoor allergens and irritants (such as pollen, mold, cold air, and air pollution) can trigger asthma attacks. Find out what your asthma triggers are and take steps to avoid them.  Monitor your breathing. Learn to recognize warning signs of an attack, such as coughing, wheezing, or shortness of breath. Your lung function may  decrease before you notice any signs or symptoms, so regularly measure and record your peak airflow with a home peak flow meter.  Identify and treat attacks early. If you act quickly, you are less likely to have a severe attack. You will also need less medicine to control your symptoms. When your peak flow measurements decrease and alert you to an upcoming attack, take your medicine as instructed and immediately stop any activity that may have triggered the attack. If your symptoms do not improve, get medical help.  Pay attention to increasing quick-relief inhaler use. If you find yourself relying on your quick-relief inhaler, your asthma is not under control. See your health care provider about adjusting your treatment. WHAT CAN MAKE MY SYMPTOMS WORSE? A number of common things can set off or make your asthma symptoms worse and cause temporary increased inflammation of your airways. Keep track of your asthma symptoms for several weeks, detailing all the environmental and emotional factors that are linked with your asthma. When you have an asthma attack, go back to your asthma diary to see which factor, or combination of factors, might have contributed to it. Once you know what these factors are, you can take steps to control many of them. If you have allergies and asthma, it is important to take asthma prevention steps at home. Minimizing contact with the substance to which you are allergic will help prevent an asthma attack. Some triggers and ways to avoid these triggers are: Animal Dander:  Some people are allergic to the flakes of skin or dried saliva from animals with fur or feathers.   There is no such thing as a hypoallergenic dog or cat breed. All dogs or cats can cause allergies, even if they don't shed.  Keep these pets out of your home.  If you are not able to keep a pet outdoors, keep the pet out of your bedroom and other sleeping areas at all times, and keep the door closed.  Remove  carpets and furniture covered with cloth from your home. If that is not possible, keep the pet away from fabric-covered furniture and carpets. Dust Mites: Many people with asthma are allergic to dust mites. Dust mites are tiny bugs that are found in every home in mattresses, pillows, carpets, fabric-covered furniture, bedcovers, clothes, stuffed toys, and other fabric-covered items.   Cover your mattress in a special dust-proof cover.  Cover your pillow in a special dust-proof cover, or wash the pillow each week in hot water. Water must be hotter than 130 F (54.4 C) to kill dust mites. Cold or warm water used with detergent and bleach can also be effective.  Wash the sheets and blankets on your bed each week in hot water.  Try not to sleep or lie on cloth-covered cushions.  Call ahead when traveling and ask for a smoke-free hotel room. Bring your own bedding and pillows in case the hotel only supplies feather pillows and down comforters, which may contain dust mites and cause asthma symptoms.  Remove carpets from your bedroom and those laid on concrete, if you can.  Keep stuffed toys out of the bed, or wash the toys weekly in hot water or cooler water with detergent and bleach. Cockroaches: Many people with asthma are allergic to the droppings and remains of cockroaches.   Keep food and garbage in closed containers. Never leave food out.  Use poison baits, traps, powders, gels, or paste (for example, boric acid).  If a spray is used to kill cockroaches, stay out of the room until the odor goes away. Indoor Mold:  Fix leaky faucets, pipes, or other sources of water that have mold around them.  Clean floors and moldy surfaces with a fungicide or diluted bleach.  Avoid using humidifiers, vaporizers, or swamp coolers. These can spread molds through the air. Pollen and Outdoor Mold:  When pollen or mold spore counts are high, try to keep your windows closed.  Stay indoors with  windows closed from late morning to afternoon. Pollen and some mold spore counts are highest at that time.  Ask your health care provider whether you need to take anti-inflammatory medicine or increase your dose of the medicine before your allergy season starts. Other Irritants to Avoid:  Tobacco smoke is an irritant. If you smoke, ask your health care provider how you can quit. Ask family members to quit smoking too. Do not allow smoking in your home or car.  If possible, do not use a wood-burning stove, kerosene heater, or fireplace. Minimize exposure to all sources of smoke, including to incense, candles, fires, and fireworks.  Try to stay away from strong odors and sprays, such as perfume, talcum powder, hair spray, and paints.  Decrease humidity in your home and use an indoor air cleaning device. Reduce indoor humidity to below 60%. Dehumidifiers or central air conditioners can do this.  Decrease house dust exposure by changing furnace and air cooler filters frequently.  Try to have someone else vacuum for you once or twice a week. Stay out of rooms while they are being vacuumed and for a short while afterward.  If you vacuum, use a dust mask from a hardware store, a double-layered or microfilter vacuum cleaner bag, or a vacuum cleaner with a HEPA filter.  Sulfites in foods and beverages can be irritants. Do not drink beer or wine or eat dried fruit, processed potatoes, or shrimp if they cause asthma symptoms.  Cold air can trigger an asthma attack. Cover your nose and mouth with a scarf on cold or windy days.  Several health conditions can make asthma more difficult to manage, including a runny nose, sinus infections, reflux disease, psychological stress, and sleep apnea. Work with your health care provider to manage these conditions.  Avoid close contact with people who have a respiratory infection such as a cold or the flu, since your asthma symptoms may get worse if you catch the  infection. Wash your hands thoroughly after touching items that may have been handled by people with a respiratory infection.  Get a flu shot every year to protect against the flu virus, which often makes asthma worse for days or weeks. Also get a pneumonia shot if you have not previously had one. Unlike the flu shot, the pneumonia shot does not need  to be given yearly. Medicines:  Talk to your health care provider about whether it is safe for you to take aspirin or non-steroidal anti-inflammatory medicines (NSAIDs). In a small number of people with asthma, aspirin and NSAIDs can cause asthma attacks. These medicines must be avoided by people who have known aspirin-sensitive asthma. It is important that people with aspirin-sensitive asthma read labels of all over-the-counter medicines used to treat pain, colds, coughs, and fever.  Beta blockers and ACE inhibitors are other medicines you should discuss with your health care provider. HOW CAN I FIND OUT WHAT I AM ALLERGIC TO? Ask your asthma health care provider about allergy skin testing or blood testing (the RAST test) to identify the allergens to which you are sensitive. If you are found to have allergies, the most important thing to do is to try to avoid exposure to any allergens that you are sensitive to as much as possible. Other treatments for allergies, such as medicines and allergy shots (immunotherapy) are available.  CAN I EXERCISE? Follow your health care provider's advice regarding asthma treatment before exercising. It is important to maintain a regular exercise program, but vigorous exercise, or exercise in cold, humid, or dry environments can cause asthma attacks, especially for those people who have exercise-induced asthma. Document Released: 04/07/2009 Document Revised: 12/20/2012 Document Reviewed: 10/25/2012 [facility] Patient Information 2014 [place].   Smoking Cessation Quitting smoking is important to your health and has  many advantages. However, it is not always easy to quit since nicotine is a very addictive drug. Often times, people try 3 times or more before being able to quit. This document explains the best ways for you to prepare to quit smoking. Quitting takes hard work and a lot of effort, but you can do it. ADVANTAGES OF QUITTING SMOKING  You will live longer, feel better, and live better.  Your body will feel the impact of quitting smoking almost immediately.  Within 20 minutes, blood pressure decreases. Your pulse returns to its normal level.  After 8 hours, carbon monoxide levels in the blood return to normal. Your oxygen level increases.  After 24 hours, the chance of having a heart attack starts to decrease. Your breath, hair, and body stop smelling like smoke.  After 48 hours, damaged nerve endings begin to recover. Your sense of taste and smell improve.  After 72 hours, the body is virtually free of nicotine. Your bronchial tubes relax and breathing becomes easier.  After 2 to 12 weeks, lungs can hold more air. Exercise becomes easier and circulation improves.  The risk of having a heart attack, stroke, cancer, or lung disease is greatly reduced.  After 1 year, the risk of coronary heart disease is cut in half.  After 5 years, the risk of stroke falls to the same as a nonsmoker.  After 10 years, the risk of lung cancer is cut in half and the risk of other cancers decreases significantly.  After 15 years, the risk of coronary heart disease drops, usually to the level of a nonsmoker.  If you are pregnant, quitting smoking will improve your chances of having a healthy baby.  The people you live with, especially any children, will be healthier.  You will have extra money to spend on things other than cigarettes. QUESTIONS TO THINK ABOUT BEFORE ATTEMPTING TO QUIT You may want to talk about your answers with your caregiver.  Why do you want to quit?  If you tried to quit in the past,  what  helped and what did not?  What will be the most difficult situations for you after you quit? How will you plan to handle them?  Who can help you through the tough times? Your family? Friends? A caregiver?  What pleasures do you get from smoking? What ways can you still get pleasure if you quit? Here are some questions to ask your caregiver:  How can you help me to be successful at quitting?  What medicine do you think would be best for me and how should I take it?  What should I do if I need more help?  What is smoking withdrawal like? How can I get information on withdrawal? GET READY  Set a quit date.  Change your environment by getting rid of all cigarettes, ashtrays, matches, and lighters in your home, car, or work. Do not let people smoke in your home.  Review your past attempts to quit. Think about what worked and what did not. GET SUPPORT AND ENCOURAGEMENT You have a better chance of being successful if you have help. You can get support in many ways.  Tell your family, friends, and co-workers that you are going to quit and need their support. Ask them not to smoke around you.  Get individual, group, or telephone counseling and support. Programs are available at [employer] and health centers. Call your local health department for information about programs in your area.  Spiritual beliefs and practices may help some smokers quit.  Download a "quit meter" on your computer to keep track of quit statistics, such as how long you have gone without smoking, cigarettes not smoked, and money saved.  Get a self-help book about quitting smoking and staying off of tobacco. [place] yourself from urges to smoke. Talk to someone, go for a walk, or occupy your time with a task.  Change your normal routine. Take a different route to work. Drink tea instead of coffee. Eat breakfast in a different place.  Reduce your stress. Take a hot bath,  exercise, or read a book.  Plan something enjoyable to do every day. Reward yourself for not smoking.  Explore interactive web-based programs that specialize in helping you quit. GET MEDICINE AND USE IT CORRECTLY Medicines can help you stop smoking and decrease the urge to smoke. Combining medicine with the above behavioral methods and support can greatly increase your chances of successfully quitting smoking.  Nicotine replacement therapy helps deliver nicotine to your body without the negative effects and risks of smoking. Nicotine replacement therapy includes nicotine gum, lozenges, inhalers, nasal sprays, and skin patches. Some may be available over-the-counter and others require a prescription.  Antidepressant medicine helps people abstain from smoking, but how this works is unknown. This medicine is available by prescription.  Nicotinic receptor partial agonist medicine simulates the effect of nicotine in your brain. This medicine is available by prescription. Ask your caregiver for advice about which medicines to use and how to use them based on your health history. Your caregiver will tell you what side effects to look out for if you choose to be on a medicine or therapy. Carefully read the information on the package. Do not use any other product containing nicotine while using a nicotine replacement product.  RELAPSE OR DIFFICULT SITUATIONS Most relapses occur within the first 3 months after quitting. Do not be discouraged if you start smoking again. Remember, most people try several times before finally quitting. You may have symptoms of withdrawal  because your body is used to nicotine. You may crave cigarettes, be irritable, feel very hungry, cough often, get headaches, or have difficulty concentrating. The withdrawal symptoms are only temporary. They are strongest when you first quit, but they will go away within 10 14 days. To reduce the chances of relapse, try to:  Avoid drinking  alcohol. Drinking lowers your chances of successfully quitting.  Reduce the amount of caffeine you consume. Once you quit smoking, the amount of caffeine in your body increases and can give you symptoms, such as a rapid heartbeat, sweating, and anxiety.  Avoid smokers because they can make you want to smoke.  Do not let weight gain distract you. Many smokers will gain weight when they quit, usually less than 10 pounds. Eat a healthy diet and stay active. You can always lose the weight gained after you quit.  Find ways to improve your mood other than smoking. FOR MORE INFORMATION  www.smokefree.gov  Document Released: 04/13/2001 Document Revised: 10/19/2011 Document Reviewed: 07/29/2011 [facility] Patient Information 2014 [place], [place].

## 2013-05-13 NOTE — ED Notes (Signed)
RT called for continuous nebulizer treatment.

## 2013-05-13 NOTE — ED Notes (Signed)
Pt is here with fever and exacerbation of asthma.  Pt has ran out of albuterol, reports temp 101 last nite, coughing up green sputum

## 2013-05-16 NOTE — ED Provider Notes (Signed)
Medical screening examination/treatment/procedure(s) were conducted as a shared visit with non-physician practitioner(s) and myself.  I personally evaluated the patient during the encounter.  EKG Interpretation   None       Asthma.  Treatment in ER.  Symptoms much improved.  Pt ambulated w/o desatting and had no increased WOB.  Offered admission, but requested d/c to home.  Pt felt d/c with home Rx was better option.  OK with her decision.  ER precautions given.    Elmer Sow, MD 05/16/13 [id number]

## 2013-05-25 ENCOUNTER — Encounter (HOSPITAL_COMMUNITY): Payer: Self-pay | Admitting: Emergency Medicine

## 2013-05-25 ENCOUNTER — Emergency Department (HOSPITAL_COMMUNITY)
Admission: EM | Admit: 2013-05-25 | Discharge: 2013-05-25 | Disposition: A | Discharge status: Home / Self Care | Payer: Medicaid Other | Attending: Emergency Medicine | Admitting: Emergency Medicine

## 2013-05-25 DIAGNOSIS — Z8679 Personal history of other diseases of the circulatory system: Secondary | ICD-10-CM | POA: Insufficient documentation

## 2013-05-25 DIAGNOSIS — L02415 Cutaneous abscess of right lower limb: Secondary | ICD-10-CM

## 2013-05-25 DIAGNOSIS — F411 Generalized anxiety disorder: Secondary | ICD-10-CM | POA: Insufficient documentation

## 2013-05-25 DIAGNOSIS — Z8639 Personal history of other endocrine, nutritional and metabolic disease: Secondary | ICD-10-CM | POA: Insufficient documentation

## 2013-05-25 DIAGNOSIS — L02419 Cutaneous abscess of limb, unspecified: Secondary | ICD-10-CM | POA: Insufficient documentation

## 2013-05-25 DIAGNOSIS — IMO0001 Reserved for inherently not codable concepts without codable children: Secondary | ICD-10-CM | POA: Insufficient documentation

## 2013-05-25 DIAGNOSIS — Z8781 Personal history of (healed) traumatic fracture: Secondary | ICD-10-CM | POA: Insufficient documentation

## 2013-05-25 DIAGNOSIS — F17200 Nicotine dependence, unspecified, uncomplicated: Secondary | ICD-10-CM | POA: Insufficient documentation

## 2013-05-25 DIAGNOSIS — Z79899 Other long term (current) drug therapy: Secondary | ICD-10-CM | POA: Insufficient documentation

## 2013-05-25 DIAGNOSIS — J45909 Unspecified asthma, uncomplicated: Secondary | ICD-10-CM | POA: Insufficient documentation

## 2013-05-25 DIAGNOSIS — K219 Gastro-esophageal reflux disease without esophagitis: Secondary | ICD-10-CM | POA: Insufficient documentation

## 2013-05-25 DIAGNOSIS — E669 Obesity, unspecified: Secondary | ICD-10-CM | POA: Insufficient documentation

## 2013-05-25 DIAGNOSIS — L97109 Non-pressure chronic ulcer of unspecified thigh with unspecified severity: Secondary | ICD-10-CM | POA: Insufficient documentation

## 2013-05-25 DIAGNOSIS — Z862 Personal history of diseases of the blood and blood-forming organs and certain disorders involving the immune mechanism: Secondary | ICD-10-CM | POA: Insufficient documentation

## 2013-05-25 DIAGNOSIS — L03119 Cellulitis of unspecified part of limb: Principal | ICD-10-CM

## 2013-05-25 MED ORDER — TRAMADOL HCL 50 MG PO TABS
50.0000 mg | ORAL_TABLET | Freq: Once | ORAL | Status: AC
  Administered 2013-05-25: 50 mg via ORAL
  Filled 2013-05-25: qty 1

## 2013-05-25 MED ORDER — SULFAMETHOXAZOLE-TRIMETHOPRIM 800-160 MG PO TABS: 1.0000 | ORAL_TABLET | Freq: Two times a day (BID) | ORAL | Status: DC

## 2013-05-25 MED ORDER — IBUPROFEN 600 MG PO TABS: 600.0000 mg | ORAL_TABLET | Freq: Four times a day (QID) | ORAL | Status: DC | PRN

## 2013-05-25 NOTE — ED Notes (Signed)
Pt. reports worsening abscess at right inner/upper thigh onset several days ago with drainage , denies fever .

## 2013-05-25 NOTE — ED Provider Notes (Signed)
Medical screening examination/treatment/procedure(s) were performed by non-physician practitioner and as supervising physician I was immediately available for consultation/collaboration.     Akemi Overholser, MD 05/25/13 [id number] 

## 2013-05-25 NOTE — ED Provider Notes (Signed)
CSN: [id number]     Arrival date & time 05/25/13  2159 History  This chart was scribed for non-physician practitioner Mitzi Hansen working with Veryl Speak, MD by Eston Mould, ED Scribe. This patient was seen in room TR11C/TR11C and the patient's care was started at 10:19 PM .   Chief Complaint  Patient presents with  . Abscess   The history is provided by the patient. No language interpreter was used.   HPI Comments: Cindy Hoffman is a 38 y.o. female who presents to the Emergency Department complaining of an abscess to R leg with discharge that presented 4 days ago. Pt states she noticed a boil a few days ago and states she "poped the boil". Pt states she has been anxious and states her R leg has been "shaky". Pt states she has been applying warm compresses and taking warm baths for comfort. She states she took a Pharmacist, hospital and states she was still anxious afterwards. Pt denies hx of abscess but states her child was recently dx with impetigo. Pt denies hx of DM. Pt denies fever.  Past Medical History  Diagnosis Date  . Asthma   . Migraine   . Obesity   . Thyroid disease   . Sleep apnea   . Fibromyalgia   . Anxiety   . GERD (gastroesophageal reflux disease)   . Fracture of right foot   . Anxiety    Past Surgical History  Procedure Laterality Date  . Tubal ligation    . Nasal septum surgery     Family History  Problem Relation Age of Onset  . Hypertension Mother   . Hypertension Father   . Diabetes Sister    History  Substance Use Topics  . Smoking status: Current Some Day Smoker -- 0.50 packs/day for 20 years    Types: Cigarettes  . Smokeless tobacco: Never Used  . Alcohol Use: No   OB History   Grav Para Term Preterm Abortions TAB SAB Ect Mult Living                 Review of Systems  Skin: Positive for color change and wound (abscess).  All other systems reviewed and are negative.   Allergies  Beta adrenergic blockers  Home Medications    Current Outpatient Rx  Name  Route  Sig  Dispense  Refill  . albuterol (PROAIR HFA) 108 (90 BASE) MCG/ACT inhaler   Inhalation   Inhale 2 puffs into the lungs every 6 (six) hours as needed for wheezing. 2 puffs every 4 hours as needed only  if your can't catch your breath   1 Inhaler   5   . albuterol (PROVENTIL) (2.5 MG/3ML) 0.083% nebulizer solution   Nebulization   Take 3 mLs (2.5 mg total) by nebulization every 6 (six) hours as needed for wheezing or shortness of breath.   75 mL   12   . calcium carbonate (TUMS EX) 750 MG chewable tablet   Oral   Chew 1 tablet by mouth 2 (two) times daily.         Marland Kitchen ipratropium (ATROVENT) 0.03 % nasal spray   Each Nare   Place 2 sprays into both nostrils every 12 (twelve) hours.   30 mL   12   . LORazepam (ATIVAN) 1 MG tablet   Oral   Take 1 mg by mouth every 6 (six) hours as needed for anxiety.         . mometasone-formoterol (DULERA) 200-5 MCG/ACT AERO  Take 2 puffs first thing in am and then another 2 puffs about 12 hours later.   1 Inhaler   11   . Phenylephrine-Pheniramine-DM (THERAFLU COLD & COUGH PO)   Oral   Take 1 packet by mouth once.         . Pseudoeph-Doxylamine-DM-APAP (NYQUIL PO)   Oral   Take 30 mLs by mouth once.         Marland Kitchen zolpidem (AMBIEN) 10 MG tablet   Oral   Take 10 mg by mouth at bedtime as needed for sleep.         Marland Kitchen ibuprofen (ADVIL,MOTRIN) 600 MG tablet   Oral   Take 1 tablet (600 mg total) by mouth every 6 (six) hours as needed.   30 tablet   0   . sulfamethoxazole-trimethoprim (SEPTRA DS) 800-160 MG per tablet   Oral   Take 1 tablet by mouth 2 (two) times daily.   14 tablet   0    BP 104/63  Pulse 105  Temp(Src) 98.7 F (37.1 C) (Oral)  Ht 5\' 4"  (1.626 m)  Wt 354 lb (160.573 kg)  BMI 60.73 kg/m2  SpO2 99%  LMP 05/16/2013  Physical Exam  Nursing note and vitals reviewed. Constitutional: She is oriented to person, place, and time. She appears well-developed and  well-nourished.  HENT:  Head: Normocephalic and atraumatic.  Eyes: EOM are normal.  Neck: Normal range of motion.  Cardiovascular: Normal rate.   Pulmonary/Chest: Effort normal.  Musculoskeletal: Normal range of motion.  Neurological: She is alert and oriented to person, place, and time.  Skin: Skin is warm and dry.  Abscess to R medial thigh. 2 x 3 cm region of erythema and eduration with tenderness to palpation. Centralized ulceration with scant puss drainage. No red streaking.   Psychiatric: She has a normal mood and affect. Her behavior is normal.    ED Course  Procedures  DIAGNOSTIC STUDIES: Oxygen Saturation is 99% on RA, normal by my interpretation.    COORDINATION OF CARE: 10:22 PM-Discussed treatment plan which includes discharge pt with bactrim and pain medication. Advised pt to apply warm compresses. Advised pt to F/U with PCP.  Pt agreed to plan.   Labs Review Labs Reviewed - No data to display Imaging Review No results found.  EKG Interpretation   None      MDM   1. Abscess of right thigh    Pt presenting with abscess to right medial thigh. Abscess has centralized area of ulceration, I&D not performed as scant drainage present in center of ulceration. Abscess should continue to spontaneously drain with bactrim and warm compresses. Advised to f/u Monday, in 3 days if no improvement. Return precautions provided. Pt verbalized understanding and agreement with tx plan.  I personally performed the services described in this documentation, which was scribed in my presence. The recorded information has been reviewed and is accurate.    Noland Fordyce, PA-C 05/25/13 2312

## 2013-05-25 NOTE — Discharge Instructions (Signed)
Be sure to soak area with mixture of warm water and Epson salt and/or apply warm compresses to area 3-4x daily. Provide gentle massage to area to help encourage drainage. If not improving by Monday (3 days) follow up with primary care or return to ER for further evaluation and treatment.   Abscess An abscess (boil or furuncle) is an infected area on or under the skin. This area is filled with yellowish-white fluid (pus) and other material (debris). HOME CARE   Only take medicines as told by your doctor.  If you were given antibiotic medicine, take it as directed. Finish the medicine even if you start to feel better.  If gauze is used, follow your doctor's directions for changing the gauze.  To avoid spreading the infection:  Keep your abscess covered with a bandage.  Wash your hands well.  Do not share personal care items, towels, or whirlpools with others.  Avoid skin contact with others.  Keep your skin and clothes clean around the abscess.  Keep all doctor visits as told. GET HELP RIGHT AWAY IF:   You have more pain, puffiness (swelling), or redness in the wound site.  You have more fluid or blood coming from the wound site.  You have muscle aches, chills, or you feel sick.  You have a fever. MAKE SURE YOU:   Understand these instructions.  Will watch your condition.  Will get help right away if you are not doing well or get worse. Document Released: 10/06/2007 Document Revised: 10/19/2011 Document Reviewed: 07/02/2011 [facility] Patient Information 2014 [place].

## 2013-06-07 ENCOUNTER — Encounter (INDEPENDENT_AMBULATORY_CARE_PROVIDER_SITE_OTHER): Payer: Self-pay | Admitting: Surgery

## 2013-06-13 ENCOUNTER — Other Ambulatory Visit: Payer: Self-pay | Admitting: Internal Medicine

## 2013-06-18 ENCOUNTER — Ambulatory Visit (INDEPENDENT_AMBULATORY_CARE_PROVIDER_SITE_OTHER): Payer: Medicaid Other | Admitting: Surgery

## 2013-06-18 ENCOUNTER — Encounter (INDEPENDENT_AMBULATORY_CARE_PROVIDER_SITE_OTHER): Payer: Self-pay | Admitting: Surgery

## 2013-06-18 VITALS — BP 122/80 | HR 76 | Temp 97.4°F | Resp 18 | Ht 64.0 in | Wt 349.0 lb

## 2013-06-18 DIAGNOSIS — E041 Nontoxic single thyroid nodule: Secondary | ICD-10-CM | POA: Insufficient documentation

## 2013-06-18 NOTE — Progress Notes (Signed)
Patient ID: Cindy Hoffman, female   DOB: [birth year], 38 y.o.   MRN: [id number]  Chief Complaint  Patient presents with  . Thyroid Nodule    new pt    HPI Cindy Hoffman is a 38 y.o. female.   HPI This is a pleasant female who was referred by Dr. [name] for evaluation of a right thyroid nodule. She had a biopsy by [facility] last year which were apparently was indeterminate. She had her P. Ultrasound in December showing a stable 2 cm nodule which is both solid and cystic in appearance and the right thyroid lobe. She reports a condition of pain in her neck at the area of the nodule. She says she has some difficulty swallowing as well. She is otherwise without complaints Past Medical History  Diagnosis Date  . Asthma   . Migraine   . Obesity   . Thyroid disease   . Sleep apnea   . Fibromyalgia   . Anxiety   . GERD (gastroesophageal reflux disease)   . Fracture of right foot   . Anxiety     Past Surgical History  Procedure Laterality Date  . Tubal ligation    . Nasal septum surgery      Family History  Problem Relation Age of Onset  . Hypertension Mother   . Hypertension Father   . Diabetes Sister   . Cancer Paternal Grandmother     breast    Social History History  Substance Use Topics  . Smoking status: Current Some Day Smoker -- 0.50 packs/day for 20 years    Types: Cigarettes  . Smokeless tobacco: Never Used  . Alcohol Use: No    Allergies  Allergen Reactions  . Beta Adrenergic Blockers Anaphylaxis    Current Outpatient Prescriptions  Medication Sig Dispense Refill  . albuterol (PROAIR HFA) 108 (90 BASE) MCG/ACT inhaler Inhale 2 puffs into the lungs every 6 (six) hours as needed for wheezing. 2 puffs every 4 hours as needed only  if your can't catch your breath  1 Inhaler  5  . albuterol (PROVENTIL) (2.5 MG/3ML) 0.083% nebulizer solution Take 3 mLs (2.5 mg total) by nebulization every 6 (six) hours as needed for wheezing or shortness of breath.  75 mL  12  .  DULERA 200-5 MCG/ACT AERO INHALE 2 PUFFS INTO THE LUNGS FIRST THING IN THE MORNING AND THEN ANOTHER 2 PUFFS 12 HOURS LATER  3 Inhaler  0  . LORazepam (ATIVAN) 1 MG tablet Take 1 mg by mouth every 6 (six) hours as needed for anxiety.      Marland Kitchen zolpidem (AMBIEN) 10 MG tablet Take 10 mg by mouth at bedtime as needed for sleep.       No current facility-administered medications for this visit.    Review of Systems Review of Systems  Constitutional: Negative for fever, chills and unexpected weight change.  HENT: Negative for congestion, hearing loss, sore throat, trouble swallowing and voice change.   Eyes: Negative for visual disturbance.  Respiratory: Negative for cough and wheezing.   Cardiovascular: Negative for chest pain, palpitations and leg swelling.  Gastrointestinal: Negative for nausea, vomiting, abdominal pain, diarrhea, constipation, blood in stool, abdominal distention and anal bleeding.  Genitourinary: Negative for hematuria, vaginal bleeding and difficulty urinating.  Musculoskeletal: Negative for arthralgias.  Skin: Negative for rash and wound.  Neurological: Negative for seizures, syncope and headaches.  Hematological: Negative for adenopathy. Does not bruise/bleed easily.  Psychiatric/Behavioral: Negative for confusion.    Blood pressure  122/80, pulse 76, temperature 97.4 F (36.3 C), temperature source Oral, resp. rate 18, height 5\' 4"  (1.626 m), weight 349 lb (158.305 kg), last menstrual period 05/16/2013.  Physical Exam Physical Exam  Constitutional: She is oriented to person, place, and time. No distress.  Morbidly obese female in no acute distress  HENT:  Head: Normocephalic and atraumatic.  Right Ear: External ear normal.  Left Ear: External ear normal.  Nose: Nose normal.  Mouth/Throat: Oropharynx is clear and moist.  Eyes: Conjunctivae are normal. Pupils are equal, round, and reactive to light. Right eye exhibits no discharge. Left eye exhibits no discharge. No  scleral icterus.  Neck: Normal range of motion. No tracheal deviation present.  Slight enlargement with nodule in the right thyroid lobe  Cardiovascular: Normal rate, regular rhythm, normal heart sounds and intact distal pulses.   No murmur heard. Pulmonary/Chest: Effort normal and breath sounds normal. No respiratory distress. She has no wheezes.  Abdominal: Soft. Bowel sounds are normal. She exhibits no distension. There is no tenderness.  Musculoskeletal: Normal range of motion. She exhibits no edema and no tenderness.  Lymphadenopathy:    She has no cervical adenopathy.  Neurological: She is alert and oriented to person, place, and time.  Skin: Skin is warm and dry. No rash noted. No erythema.  Psychiatric: Her behavior is normal. Judgment normal.    Data Reviewed I reviewed her ultrasound of the thyroid revealing the 2.0 x 1.7 x 1.8 cm right thyroid nodule which is both solid and cystic in appearance  Assessment    Right thyroid nodule     Plan    I discussed the diagnosis with her and her friend in detail. I told her that I could not confirm that the nodule is the source of her discomfort. Nonetheless, her option regarding the nodule include continue close surveillance with ultrasound and possible for the biopsy versus a right thyroid lobectomy. I discussed the risks of surgery which includes but is not limited to bleeding, infection, injury to surrounding structures including the nerve to the vocal cords, postoperative hoarseness, cardiopulmonary issues given her asthma and obesity, need for further surgery should malignancy be present, et Ronney Asters. She is adamant about proceeding with right thyroid lobectomy which I feel is fairly reasonable. Surgery will be scheduled       Tamie Minteer A 06/18/2013, 11:43 AM

## 2013-06-25 ENCOUNTER — Encounter (HOSPITAL_COMMUNITY): Payer: Self-pay | Admitting: Emergency Medicine

## 2013-06-25 DIAGNOSIS — Z8781 Personal history of (healed) traumatic fracture: Secondary | ICD-10-CM | POA: Insufficient documentation

## 2013-06-25 DIAGNOSIS — Z79899 Other long term (current) drug therapy: Secondary | ICD-10-CM | POA: Insufficient documentation

## 2013-06-25 DIAGNOSIS — J45901 Unspecified asthma with (acute) exacerbation: Secondary | ICD-10-CM | POA: Insufficient documentation

## 2013-06-25 DIAGNOSIS — Z8669 Personal history of other diseases of the nervous system and sense organs: Secondary | ICD-10-CM | POA: Insufficient documentation

## 2013-06-25 DIAGNOSIS — Z862 Personal history of diseases of the blood and blood-forming organs and certain disorders involving the immune mechanism: Secondary | ICD-10-CM | POA: Insufficient documentation

## 2013-06-25 DIAGNOSIS — F17200 Nicotine dependence, unspecified, uncomplicated: Secondary | ICD-10-CM | POA: Insufficient documentation

## 2013-06-25 DIAGNOSIS — Z8719 Personal history of other diseases of the digestive system: Secondary | ICD-10-CM | POA: Insufficient documentation

## 2013-06-25 DIAGNOSIS — Z3202 Encounter for pregnancy test, result negative: Secondary | ICD-10-CM | POA: Insufficient documentation

## 2013-06-25 DIAGNOSIS — Z8639 Personal history of other endocrine, nutritional and metabolic disease: Secondary | ICD-10-CM | POA: Insufficient documentation

## 2013-06-25 DIAGNOSIS — J069 Acute upper respiratory infection, unspecified: Secondary | ICD-10-CM | POA: Insufficient documentation

## 2013-06-25 DIAGNOSIS — F411 Generalized anxiety disorder: Secondary | ICD-10-CM | POA: Insufficient documentation

## 2013-06-25 DIAGNOSIS — R000 Tachycardia, unspecified: Secondary | ICD-10-CM | POA: Insufficient documentation

## 2013-06-25 DIAGNOSIS — G43909 Migraine, unspecified, not intractable, without status migrainosus: Secondary | ICD-10-CM | POA: Insufficient documentation

## 2013-06-25 NOTE — ED Notes (Signed)
Pt presents with c/o generalized body aches, cough, nausea, and headache. Pt says her symptoms started yesterday. Pt has vomited once since yesterday. Pt is also an asthmatic, talking in complete sentences at this time.

## 2013-06-26 ENCOUNTER — Emergency Department (HOSPITAL_COMMUNITY)
Admission: EM | Admit: 2013-06-26 | Discharge: 2013-06-26 | Disposition: A | Discharge status: Home / Self Care | Payer: Medicaid Other | Attending: Emergency Medicine | Admitting: Emergency Medicine

## 2013-06-26 DIAGNOSIS — J988 Other specified respiratory disorders: Secondary | ICD-10-CM

## 2013-06-26 DIAGNOSIS — B9789 Other viral agents as the cause of diseases classified elsewhere: Secondary | ICD-10-CM

## 2013-06-26 HISTORY — DX: Other specified respiratory disorders: J98.8

## 2013-06-26 HISTORY — DX: Other viral agents as the cause of diseases classified elsewhere: B97.89

## 2013-06-26 LAB — URINE MICROSCOPIC-ADD ON

## 2013-06-26 LAB — URINALYSIS, ROUTINE W REFLEX MICROSCOPIC
Bilirubin Urine: NEGATIVE
Glucose, UA: NEGATIVE mg/dL
Hgb urine dipstick: NEGATIVE
Ketones, ur: NEGATIVE mg/dL
Nitrite: NEGATIVE
Protein, ur: NEGATIVE mg/dL
Specific Gravity, Urine: 1.024 (ref 1.005–1.030)
UROBILINOGEN UA: 0.2 mg/dL (ref 0.0–1.0)
pH: 8 (ref 5.0–8.0)

## 2013-06-26 LAB — POC URINE PREG, ED: PREG TEST UR: NEGATIVE

## 2013-06-26 MED ORDER — ALBUTEROL SULFATE (2.5 MG/3ML) 0.083% IN NEBU
5.0000 mg | INHALATION_SOLUTION | Freq: Once | RESPIRATORY_TRACT | Status: AC
  Administered 2013-06-26: 5 mg via RESPIRATORY_TRACT
  Filled 2013-06-26: qty 6

## 2013-06-26 MED ORDER — ALBUTEROL SULFATE HFA 108 (90 BASE) MCG/ACT IN AERS
2.0000 | INHALATION_SPRAY | RESPIRATORY_TRACT | Status: DC | PRN
  Administered 2013-06-26: 2 via RESPIRATORY_TRACT
  Filled 2013-06-26: qty 6.7

## 2013-06-26 MED ORDER — HYDROCODONE-ACETAMINOPHEN 7.5-325 MG/15ML PO SOLN: 10.0000 mL | Freq: Four times a day (QID) | ORAL | Status: DC | PRN

## 2013-06-26 MED ORDER — PREDNISONE 20 MG PO TABS: 60.0000 mg | ORAL_TABLET | Freq: Every day | ORAL | Status: DC

## 2013-06-26 MED ORDER — PREDNISONE 20 MG PO TABS
60.0000 mg | ORAL_TABLET | Freq: Once | ORAL | Status: AC
  Administered 2013-06-26: 60 mg via ORAL
  Filled 2013-06-26: qty 3

## 2013-06-26 MED ORDER — OSELTAMIVIR PHOSPHATE 75 MG PO CAPS: 75.0000 mg | ORAL_CAPSULE | Freq: Two times a day (BID) | ORAL | Status: DC

## 2013-06-26 MED ORDER — OSELTAMIVIR PHOSPHATE 75 MG PO CAPS
75.0000 mg | ORAL_CAPSULE | Freq: Every day | ORAL | Status: DC
  Administered 2013-06-26: 75 mg via ORAL
  Filled 2013-06-26: qty 1

## 2013-06-26 MED ORDER — HYDROCODONE-ACETAMINOPHEN 7.5-325 MG/15ML PO SOLN
10.0000 mL | Freq: Once | ORAL | Status: AC
  Administered 2013-06-26: 10 mL via ORAL
  Filled 2013-06-26: qty 15

## 2013-06-26 NOTE — Discharge Instructions (Signed)

## 2013-06-26 NOTE — ED Provider Notes (Signed)
CSN: [id number]     Arrival date & time 06/25/13  2026 History   First MD Initiated Contact with Patient 06/26/13 0136     Chief Complaint  Patient presents with  . Generalized Body Aches  . Cough  . Headache  . Nausea     (Consider location/radiation/quality/duration/timing/severity/associated sxs/prior Treatment) HPI History provided by patient. Has history of asthma, presents with fever, cough, wheezing, body aches, sore throat, headache. Sick contacts at home has similar symptoms. Symptoms moderate to severe. No rashes. No recent travel. No chest pain.  Past Medical History  Diagnosis Date  . Asthma   . Migraine   . Obesity   . Thyroid disease   . Sleep apnea   . Fibromyalgia   . Anxiety   . GERD (gastroesophageal reflux disease)   . Fracture of right foot   . Anxiety    Past Surgical History  Procedure Laterality Date  . Tubal ligation    . Nasal septum surgery     Family History  Problem Relation Age of Onset  . Hypertension Mother   . Hypertension Father   . Diabetes Sister   . Cancer Paternal Grandmother     breast   History  Substance Use Topics  . Smoking status: Current Some Day Smoker -- 0.50 packs/day for 20 years    Types: Cigarettes  . Smokeless tobacco: Never Used  . Alcohol Use: No   OB History   Grav Para Term Preterm Abortions TAB SAB Ect Mult Living                 Review of Systems  Constitutional: Positive for fever.  HENT: Positive for congestion and sore throat.   Respiratory: Positive for cough and wheezing.   Cardiovascular: Negative for chest pain.  Gastrointestinal: Negative for abdominal pain.  Genitourinary: Negative for dysuria.  Musculoskeletal: Negative for back pain, neck pain and neck stiffness.  Skin: Negative for rash.  Neurological: Positive for headaches.  All other systems reviewed and are negative.      Allergies  Beta adrenergic blockers  Home Medications   Current Outpatient Rx  Name  Route  Sig   Dispense  Refill  . albuterol (PROAIR HFA) 108 (90 BASE) MCG/ACT inhaler   Inhalation   Inhale 2 puffs into the lungs every 6 (six) hours as needed for wheezing. 2 puffs every 4 hours as needed only  if your can't catch your breath   1 Inhaler   5   . albuterol (PROVENTIL) (2.5 MG/3ML) 0.083% nebulizer solution   Nebulization   Take 3 mLs (2.5 mg total) by nebulization every 6 (six) hours as needed for wheezing or shortness of breath.   75 mL   12   . Aspirin-Caffeine (BC FAST PAIN RELIEF ARTHRITIS) 1000-65 MG PACK   Oral   Take 1 Package by mouth daily as needed (flu-like symptoms).         Marland Kitchen aspirin-sod bicarb-citric acid (ALKA-SELTZER) 325 MG TBEF tablet   Oral   Take 325 mg by mouth every 6 (six) hours as needed (flu-like symptoms cold and cough).         . DULERA 200-5 MCG/ACT AERO      INHALE 2 PUFFS INTO THE LUNGS FIRST THING IN THE MORNING AND THEN ANOTHER 2 PUFFS 12 HOURS LATER   3 Inhaler   0     Defer to PCP for future refills   . LORazepam (ATIVAN) 1 MG tablet   Oral  Take 1 mg by mouth every 6 (six) hours as needed for anxiety.          BP 149/92  Pulse 113  Temp(Src) 99.6 F (37.6 C) (Oral)  Resp 22  Ht 5\' 4"  (1.626 m)  Wt 350 lb (158.759 kg)  BMI 60.05 kg/m2  SpO2 97%  LMP 06/22/2013 Physical Exam  Constitutional: She is oriented to person, place, and time. She appears well-developed and well-nourished.  HENT:  Head: Normocephalic and atraumatic.  Mouth/Throat: Oropharynx is clear and moist. No oropharyngeal exudate.  Eyes: EOM are normal. Pupils are equal, round, and reactive to light. No scleral icterus.  Neck: Neck supple.  Cardiovascular: Regular rhythm and intact distal pulses.   tachycardic  Pulmonary/Chest: Effort normal. No stridor. No respiratory distress. She exhibits no tenderness.  Bilateral mild expiratory wheezes, no retractions or increased work of breathing  Abdominal: Soft. She exhibits no distension. There is no  tenderness.  Morbidly obese  Musculoskeletal: Normal range of motion. She exhibits no edema and no tenderness.  Neurological: She is alert and oriented to person, place, and time.  Skin: Skin is warm and dry.    ED Course  Procedures (including critical care time) Labs Review Labs Reviewed  URINALYSIS, ROUTINE W REFLEX MICROSCOPIC  POC URINE PREG, ED   Room air pulse ox 97% is adequate  Albuterol treatment. Prednisone. Lortab elixir. Tamiflu initiated  Plan discharge home with albuterol inhaler, prescription for prednisone and Tamiflu. PT also given Lortab elixir. She agrees to return precautions and followup with primary care physician. MDM   Diagnosis: viral respiratory infection with wheezes  Will cover for possible influenza Wheezes improved with albuterol treatment. discharge home with inhaler Symptomatically improved with medications provided Vital signs and nursing notes reviewed and considered    Teressa Lower, MD 06/26/13 [phone]

## 2013-07-11 ENCOUNTER — Encounter (HOSPITAL_COMMUNITY): Payer: Self-pay | Admitting: Pharmacy Technician

## 2013-07-12 ENCOUNTER — Other Ambulatory Visit (HOSPITAL_COMMUNITY): Payer: Self-pay | Admitting: Surgery

## 2013-07-12 NOTE — Progress Notes (Signed)
Chest x ray 1/15, EKG 8/14 EPIC

## 2013-07-12 NOTE — Patient Instructions (Addendum)
      Your procedure is scheduled on: 07/19/13 THURSDAY  Report to [facility] at   0830   AM.  Call this number if you have problems the morning of surgery: [place]  Do not eat food  Or drink :After Midnight.WEDNESDAY NIGHT   Take these medicines the morning of surgery with A SIP OF WATER::       DELURA inhaler MAY take Ativan / use Albuterol if needed   .  Contacts, dentures or partial plates, or metal hairpins  can not be worn to surgery. Your family will be responsible for glasses, dentures, hearing aides while you are in surgery  Leave suitcase in the car. After surgery it may be brought to your room.  For patients admitted to the hospital, checkout time is 11:00 AM day of  discharge.                DO NOT WEAR JEWELRY, LOTIONS, POWDERS, OR PERFUMES.  WOMEN-- DO NOT SHAVE LEGS OR UNDERARMS FOR 48 HOURS BEFORE SHOWERS. MEN MAY SHAVE FACE.  Patients discharged the day of surgery will not be allowed to drive home. IF going home the day of surgery, you must have a driver and someone to stay with you for the first 24 hours  Name and phone number of your driver:   Overnight stay                                                                                        FAILURE TO [place]                                                  Patient Signature _____________________________

## 2013-07-16 ENCOUNTER — Encounter (HOSPITAL_COMMUNITY): Payer: Self-pay

## 2013-07-16 ENCOUNTER — Encounter (HOSPITAL_COMMUNITY)
Admission: RE | Admit: 2013-07-16 | Discharge: 2013-07-16 | Disposition: A | Discharge status: Home / Self Care | Payer: Medicaid Other | Source: Ambulatory Visit | Attending: Surgery | Admitting: Surgery

## 2013-07-16 ENCOUNTER — Encounter (INDEPENDENT_AMBULATORY_CARE_PROVIDER_SITE_OTHER): Payer: Self-pay

## 2013-07-16 HISTORY — DX: Cardiac arrhythmia, unspecified: I49.9

## 2013-07-16 HISTORY — DX: Other viral agents as the cause of diseases classified elsewhere: B97.89

## 2013-07-16 HISTORY — DX: Other specified respiratory disorders: J98.8

## 2013-07-16 HISTORY — DX: Adverse effect of unspecified anesthetic, initial encounter: T41.45XA

## 2013-07-16 HISTORY — DX: Other complications of anesthesia, initial encounter: T88.59XA

## 2013-07-16 LAB — CBC
HCT: 38.4 % (ref 36.0–46.0)
Hemoglobin: 12.2 g/dL (ref 12.0–15.0)
MCH: 26.5 pg (ref 26.0–34.0)
MCHC: 31.8 g/dL (ref 30.0–36.0)
MCV: 83.5 fL (ref 78.0–100.0)
PLATELETS: 404 10*3/uL — AB (ref 150–400)
RBC: 4.6 MIL/uL (ref 3.87–5.11)
RDW: 15.3 % (ref 11.5–15.5)
WBC: 10.6 10*3/uL — AB (ref 4.0–10.5)

## 2013-07-16 LAB — BASIC METABOLIC PANEL
BUN: 11 mg/dL (ref 6–23)
CO2: 26 mEq/L (ref 19–32)
Calcium: 9.1 mg/dL (ref 8.4–10.5)
Chloride: 101 mEq/L (ref 96–112)
Creatinine, Ser: 0.72 mg/dL (ref 0.50–1.10)
GLUCOSE: 105 mg/dL — AB (ref 70–99)
POTASSIUM: 3.9 meq/L (ref 3.7–5.3)
SODIUM: 139 meq/L (ref 137–147)

## 2013-07-16 LAB — HCG, SERUM, QUALITATIVE: Preg, Serum: NEGATIVE

## 2013-07-16 NOTE — Progress Notes (Signed)
Chest,1/15, EKG 8/14 EPIC

## 2013-07-18 NOTE — H&P (Signed)
Chief Complaint   Patient presents with   .  Thyroid Nodule     new pt   HPI  Cindy Hoffman is a 38 y.o. female.  HPI  This is a pleasant female who was referred by Dr. [name] for evaluation of a right thyroid nodule. She had a biopsy by [facility] last year which were apparently was indeterminate. She had her P. Ultrasound in December showing a stable 2 cm nodule which is both solid and cystic in appearance and the right thyroid lobe. She reports a condition of pain in her neck at the area of the nodule. She says she has some difficulty swallowing as well. She is otherwise without complaints  Past Medical History   Diagnosis  Date   .  Asthma    .  Migraine    .  Obesity    .  Thyroid disease    .  Sleep apnea    .  Fibromyalgia    .  Anxiety    .  GERD (gastroesophageal reflux disease)    .  Fracture of right foot    .  Anxiety     Past Surgical History   Procedure  Laterality  Date   .  Tubal ligation     .  Nasal septum surgery      Family History   Problem  Relation  Age of Onset   .  Hypertension  Mother    .  Hypertension  Father    .  Diabetes  Sister    .  Cancer  Paternal Grandmother      breast   Social History  History   Substance Use Topics   .  Smoking status:  Current Some Day Smoker -- 0.50 packs/day for 20 years     Types:  Cigarettes   .  Smokeless tobacco:  Never Used   .  Alcohol Use:  No    Allergies   Allergen  Reactions   .  Beta Adrenergic Blockers  Anaphylaxis    Current Outpatient Prescriptions   Medication  Sig  Dispense  Refill   .  albuterol (PROAIR HFA) 108 (90 BASE) MCG/ACT inhaler  Inhale 2 puffs into the lungs every 6 (six) hours as needed for wheezing. 2 puffs every 4 hours as needed only if your can't catch your breath  1 Inhaler  5   .  albuterol (PROVENTIL) (2.5 MG/3ML) 0.083% nebulizer solution  Take 3 mLs (2.5 mg total) by nebulization every 6 (six) hours as needed for wheezing or shortness of breath.  75 mL  12   .  DULERA  200-5 MCG/ACT AERO  INHALE 2 PUFFS INTO THE LUNGS FIRST THING IN THE MORNING AND THEN ANOTHER 2 PUFFS 12 HOURS LATER  3 Inhaler  0   .  LORazepam (ATIVAN) 1 MG tablet  Take 1 mg by mouth every 6 (six) hours as needed for anxiety.     Marland Kitchen  zolpidem (AMBIEN) 10 MG tablet  Take 10 mg by mouth at bedtime as needed for sleep.      No current facility-administered medications for this visit.   Review of Systems  Review of Systems  Constitutional: Negative for fever, chills and unexpected weight change.  HENT: Negative for congestion, hearing loss, sore throat, trouble swallowing and voice change.  Eyes: Negative for visual disturbance.  Respiratory: Negative for cough and wheezing.  Cardiovascular: Negative for chest pain, palpitations and leg swelling.  Gastrointestinal: Negative for  nausea, vomiting, abdominal pain, diarrhea, constipation, blood in stool, abdominal distention and anal bleeding.  Genitourinary: Negative for hematuria, vaginal bleeding and difficulty urinating.  Musculoskeletal: Negative for arthralgias.  Skin: Negative for rash and wound.  Neurological: Negative for seizures, syncope and headaches.  Hematological: Negative for adenopathy. Does not bruise/bleed easily.  Psychiatric/Behavioral: Negative for confusion.  Blood pressure 122/80, pulse 76, temperature 97.4 F (36.3 C), temperature source Oral, resp. rate 18, height 5\' 4"  (1.626 m), weight 349 lb (158.305 kg), last menstrual period 05/16/2013.  Physical Exam  Physical Exam  Constitutional: She is oriented to person, place, and time. No distress.  Morbidly obese female in no acute distress  HENT:  Head: Normocephalic and atraumatic.  Right Ear: External ear normal.  Left Ear: External ear normal.  Nose: Nose normal.  Mouth/Throat: Oropharynx is clear and moist.  Eyes: Conjunctivae are normal. Pupils are equal, round, and reactive to light. Right eye exhibits no discharge. Left eye exhibits no discharge. No scleral  icterus.  Neck: Normal range of motion. No tracheal deviation present.  Slight enlargement with nodule in the right thyroid lobe  Cardiovascular: Normal rate, regular rhythm, normal heart sounds and intact distal pulses.  No murmur heard.  Pulmonary/Chest: Effort normal and breath sounds normal. No respiratory distress. She has no wheezes.  Abdominal: Soft. Bowel sounds are normal. She exhibits no distension. There is no tenderness.  Musculoskeletal: Normal range of motion. She exhibits no edema and no tenderness.  Lymphadenopathy:  She has no cervical adenopathy.  Neurological: She is alert and oriented to person, place, and time.  Skin: Skin is warm and dry. No rash noted. No erythema.  Psychiatric: Her behavior is normal. Judgment normal.  Data Reviewed  I reviewed her ultrasound of the thyroid revealing the 2.0 x 1.7 x 1.8 cm right thyroid nodule which is both solid and cystic in appearance  Assessment  Right thyroid nodule  Plan  I discussed the diagnosis with her and her friend in detail. I told her that I could not confirm that the nodule is the source of her discomfort. Nonetheless, her option regarding the nodule include continue close surveillance with ultrasound and possible for the biopsy versus a right thyroid lobectomy. I discussed the risks of surgery which includes but is not limited to bleeding, infection, injury to surrounding structures including the nerve to the vocal cords, postoperative hoarseness, cardiopulmonary issues given her asthma and obesity, need for further surgery should malignancy be present, et Ronney Asters. She is adamant about proceeding with right thyroid lobectomy which I feel is fairly reasonable. Surgery will be scheduled

## 2013-07-19 ENCOUNTER — Encounter (HOSPITAL_COMMUNITY)
Admission: RE | Disposition: A | Discharge status: Home / Self Care | Payer: Self-pay | Source: Ambulatory Visit | Attending: Surgery

## 2013-07-19 ENCOUNTER — Encounter (HOSPITAL_COMMUNITY): Payer: Self-pay | Admitting: *Deleted

## 2013-07-19 ENCOUNTER — Encounter (HOSPITAL_COMMUNITY): Payer: Medicaid Other | Admitting: Anesthesiology

## 2013-07-19 ENCOUNTER — Ambulatory Visit (HOSPITAL_COMMUNITY): Payer: Medicaid Other | Admitting: Anesthesiology

## 2013-07-19 ENCOUNTER — Observation Stay (HOSPITAL_COMMUNITY)
Admission: RE | Admit: 2013-07-19 | Discharge: 2013-07-20 | Disposition: A | Discharge status: Home / Self Care | Payer: Medicaid Other | Source: Ambulatory Visit | Attending: Surgery | Admitting: Surgery

## 2013-07-19 DIAGNOSIS — K219 Gastro-esophageal reflux disease without esophagitis: Secondary | ICD-10-CM | POA: Insufficient documentation

## 2013-07-19 DIAGNOSIS — R1310 Dysphagia, unspecified: Secondary | ICD-10-CM | POA: Insufficient documentation

## 2013-07-19 DIAGNOSIS — Z9851 Tubal ligation status: Secondary | ICD-10-CM | POA: Insufficient documentation

## 2013-07-19 DIAGNOSIS — D34 Benign neoplasm of thyroid gland: Secondary | ICD-10-CM | POA: Insufficient documentation

## 2013-07-19 DIAGNOSIS — F411 Generalized anxiety disorder: Secondary | ICD-10-CM | POA: Insufficient documentation

## 2013-07-19 DIAGNOSIS — E669 Obesity, unspecified: Secondary | ICD-10-CM | POA: Insufficient documentation

## 2013-07-19 DIAGNOSIS — G4730 Sleep apnea, unspecified: Secondary | ICD-10-CM | POA: Insufficient documentation

## 2013-07-19 DIAGNOSIS — E041 Nontoxic single thyroid nodule: Principal | ICD-10-CM | POA: Insufficient documentation

## 2013-07-19 DIAGNOSIS — Z888 Allergy status to other drugs, medicaments and biological substances status: Secondary | ICD-10-CM | POA: Insufficient documentation

## 2013-07-19 DIAGNOSIS — IMO0001 Reserved for inherently not codable concepts without codable children: Secondary | ICD-10-CM | POA: Insufficient documentation

## 2013-07-19 DIAGNOSIS — F17200 Nicotine dependence, unspecified, uncomplicated: Secondary | ICD-10-CM | POA: Insufficient documentation

## 2013-07-19 DIAGNOSIS — G43909 Migraine, unspecified, not intractable, without status migrainosus: Secondary | ICD-10-CM | POA: Insufficient documentation

## 2013-07-19 DIAGNOSIS — J45909 Unspecified asthma, uncomplicated: Secondary | ICD-10-CM | POA: Insufficient documentation

## 2013-07-19 DIAGNOSIS — Z79899 Other long term (current) drug therapy: Secondary | ICD-10-CM | POA: Insufficient documentation

## 2013-07-19 DIAGNOSIS — R002 Palpitations: Secondary | ICD-10-CM | POA: Insufficient documentation

## 2013-07-19 HISTORY — PX: THYROID LOBECTOMY: SHX420

## 2013-07-19 SURGERY — LOBECTOMY, THYROID
Anesthesia: General | Site: Neck | Laterality: Right

## 2013-07-19 MED ORDER — DEXAMETHASONE SODIUM PHOSPHATE 10 MG/ML IJ SOLN
INTRAMUSCULAR | Status: DC | PRN
  Administered 2013-07-19: 10 mg via INTRAVENOUS

## 2013-07-19 MED ORDER — MIDAZOLAM HCL 5 MG/5ML IJ SOLN
INTRAMUSCULAR | Status: DC | PRN
  Administered 2013-07-19: 2 mg via INTRAVENOUS

## 2013-07-19 MED ORDER — ONDANSETRON HCL 4 MG/2ML IJ SOLN
INTRAMUSCULAR | Status: AC
  Filled 2013-07-19: qty 2

## 2013-07-19 MED ORDER — ONDANSETRON HCL 4 MG/2ML IJ SOLN
4.0000 mg | Freq: Four times a day (QID) | INTRAMUSCULAR | Status: DC | PRN
  Administered 2013-07-20: 4 mg via INTRAVENOUS
  Filled 2013-07-19: qty 2

## 2013-07-19 MED ORDER — SUCCINYLCHOLINE CHLORIDE 20 MG/ML IJ SOLN
INTRAMUSCULAR | Status: DC | PRN
  Administered 2013-07-19: 160 mg via INTRAVENOUS

## 2013-07-19 MED ORDER — MORPHINE SULFATE 4 MG/ML IJ SOLN
INTRAMUSCULAR | Status: AC
  Filled 2013-07-19: qty 1

## 2013-07-19 MED ORDER — HYDROMORPHONE HCL PF 1 MG/ML IJ SOLN
INTRAMUSCULAR | Status: AC
  Filled 2013-07-19: qty 1

## 2013-07-19 MED ORDER — LACTATED RINGERS IV SOLN
INTRAVENOUS | Status: DC | PRN
  Administered 2013-07-19 (×2): via INTRAVENOUS

## 2013-07-19 MED ORDER — LIDOCAINE HCL (CARDIAC) 20 MG/ML IV SOLN
INTRAVENOUS | Status: AC
  Filled 2013-07-19: qty 5

## 2013-07-19 MED ORDER — NEOSTIGMINE METHYLSULFATE 1 MG/ML IJ SOLN
INTRAMUSCULAR | Status: AC
  Filled 2013-07-19: qty 10

## 2013-07-19 MED ORDER — MIDAZOLAM HCL 2 MG/2ML IJ SOLN
INTRAMUSCULAR | Status: AC
  Filled 2013-07-19: qty 2

## 2013-07-19 MED ORDER — NEOSTIGMINE METHYLSULFATE 1 MG/ML IJ SOLN
INTRAMUSCULAR | Status: DC | PRN
  Administered 2013-07-19: 4 mg via INTRAVENOUS

## 2013-07-19 MED ORDER — ENOXAPARIN SODIUM 40 MG/0.4ML ~~LOC~~ SOLN
40.0000 mg | SUBCUTANEOUS | Status: DC
  Administered 2013-07-20: 40 mg via SUBCUTANEOUS
  Filled 2013-07-19 (×2): qty 0.4

## 2013-07-19 MED ORDER — PROPOFOL 10 MG/ML IV BOLUS
INTRAVENOUS | Status: DC | PRN
  Administered 2013-07-19: 200 mg via INTRAVENOUS

## 2013-07-19 MED ORDER — PROPOFOL 10 MG/ML IV BOLUS
INTRAVENOUS | Status: AC
  Filled 2013-07-19: qty 20

## 2013-07-19 MED ORDER — ONDANSETRON HCL 4 MG PO TABS: 4.0000 mg | ORAL_TABLET | Freq: Four times a day (QID) | ORAL | Status: DC | PRN

## 2013-07-19 MED ORDER — FENTANYL CITRATE 0.05 MG/ML IJ SOLN
INTRAMUSCULAR | Status: AC
  Filled 2013-07-19: qty 2

## 2013-07-19 MED ORDER — MORPHINE SULFATE 4 MG/ML IJ SOLN
4.0000 mg | INTRAMUSCULAR | Status: DC | PRN
  Administered 2013-07-19 – 2013-07-20 (×7): 4 mg via INTRAVENOUS
  Filled 2013-07-19 (×6): qty 1

## 2013-07-19 MED ORDER — GLYCOPYRROLATE 0.2 MG/ML IJ SOLN
INTRAMUSCULAR | Status: DC | PRN
  Administered 2013-07-19: 0.6 mg via INTRAVENOUS

## 2013-07-19 MED ORDER — LACTATED RINGERS IV SOLN: INTRAVENOUS | Status: DC

## 2013-07-19 MED ORDER — GLYCOPYRROLATE 0.2 MG/ML IJ SOLN
INTRAMUSCULAR | Status: AC
  Filled 2013-07-19: qty 3

## 2013-07-19 MED ORDER — CISATRACURIUM BESYLATE (PF) 10 MG/5ML IV SOLN
INTRAVENOUS | Status: DC | PRN
  Administered 2013-07-19 (×2): 2 mg via INTRAVENOUS
  Administered 2013-07-19: 6 mg via INTRAVENOUS

## 2013-07-19 MED ORDER — POTASSIUM CHLORIDE IN NACL 20-0.9 MEQ/L-% IV SOLN
INTRAVENOUS | Status: DC
  Administered 2013-07-19: 14:00:00 via INTRAVENOUS
  Administered 2013-07-20: 75 mL via INTRAVENOUS
  Filled 2013-07-19 (×3): qty 1000

## 2013-07-19 MED ORDER — SODIUM CHLORIDE 3 % IN NEBU
INHALATION_SOLUTION | RESPIRATORY_TRACT | Status: AC
  Filled 2013-07-19: qty 15

## 2013-07-19 MED ORDER — HYDROMORPHONE HCL PF 1 MG/ML IJ SOLN
0.2500 mg | INTRAMUSCULAR | Status: DC | PRN
  Administered 2013-07-19 (×4): 0.5 mg via INTRAVENOUS

## 2013-07-19 MED ORDER — FENTANYL CITRATE 0.05 MG/ML IJ SOLN
INTRAMUSCULAR | Status: DC | PRN
  Administered 2013-07-19 (×7): 50 ug via INTRAVENOUS

## 2013-07-19 MED ORDER — FENTANYL CITRATE 0.05 MG/ML IJ SOLN
INTRAMUSCULAR | Status: AC
  Filled 2013-07-19: qty 5

## 2013-07-19 MED ORDER — LORAZEPAM 1 MG PO TABS
1.0000 mg | ORAL_TABLET | Freq: Three times a day (TID) | ORAL | Status: DC | PRN
  Administered 2013-07-19: 1 mg via ORAL
  Filled 2013-07-19: qty 1

## 2013-07-19 MED ORDER — DEXTROSE 5 % IV SOLN
3.0000 g | INTRAVENOUS | Status: AC
  Administered 2013-07-19: 3 g via INTRAVENOUS
  Filled 2013-07-19: qty 3000

## 2013-07-19 MED ORDER — ALBUTEROL SULFATE (2.5 MG/3ML) 0.083% IN NEBU
2.5000 mg | INHALATION_SOLUTION | Freq: Once | RESPIRATORY_TRACT | Status: AC
  Administered 2013-07-19: 2.5 mg via RESPIRATORY_TRACT

## 2013-07-19 MED ORDER — OXYCODONE-ACETAMINOPHEN 5-325 MG PO TABS
1.0000 | ORAL_TABLET | ORAL | Status: DC | PRN
  Administered 2013-07-19 – 2013-07-20 (×4): 2 via ORAL
  Filled 2013-07-19 (×4): qty 2

## 2013-07-19 MED ORDER — ONDANSETRON HCL 4 MG/2ML IJ SOLN
INTRAMUSCULAR | Status: DC | PRN
  Administered 2013-07-19: 4 mg via INTRAVENOUS

## 2013-07-19 MED ORDER — ALBUTEROL SULFATE (2.5 MG/3ML) 0.083% IN NEBU
INHALATION_SOLUTION | RESPIRATORY_TRACT | Status: AC
  Filled 2013-07-19: qty 3

## 2013-07-19 MED ORDER — FAMOTIDINE 20 MG PO TABS
20.0000 mg | ORAL_TABLET | Freq: Every day | ORAL | Status: DC
  Administered 2013-07-19: 20 mg via ORAL
  Filled 2013-07-19 (×2): qty 1

## 2013-07-19 MED ORDER — ALBUTEROL SULFATE (2.5 MG/3ML) 0.083% IN NEBU: 2.5000 mg | INHALATION_SOLUTION | Freq: Four times a day (QID) | RESPIRATORY_TRACT | Status: DC | PRN

## 2013-07-19 MED ORDER — HEMOSTATIC AGENTS (NO CHARGE) OPTIME
TOPICAL | Status: DC | PRN
  Administered 2013-07-19: 1 via TOPICAL

## 2013-07-19 MED ORDER — SODIUM CHLORIDE 0.9 % IR SOLN
Status: DC | PRN
  Administered 2013-07-19: 1000 mL

## 2013-07-19 MED ORDER — LIDOCAINE HCL (CARDIAC) 20 MG/ML IV SOLN
INTRAVENOUS | Status: DC | PRN
  Administered 2013-07-19: 100 mg via INTRAVENOUS

## 2013-07-19 MED ORDER — MOMETASONE FURO-FORMOTEROL FUM 200-5 MCG/ACT IN AERO
2.0000 | INHALATION_SPRAY | Freq: Two times a day (BID) | RESPIRATORY_TRACT | Status: DC
  Administered 2013-07-19: 2 via RESPIRATORY_TRACT
  Filled 2013-07-19: qty 8.8

## 2013-07-19 MED ORDER — DEXAMETHASONE SODIUM PHOSPHATE 10 MG/ML IJ SOLN
INTRAMUSCULAR | Status: AC
  Filled 2013-07-19: qty 1

## 2013-07-19 SURGICAL SUPPLY — 42 items
APL SKNCLS STERI-STRIP NONHPOA (GAUZE/BANDAGES/DRESSINGS) ×1
ATTRACTOMAT 16X20 MAGNETIC DRP (DRAPES) ×2 IMPLANT
BENZOIN TINCTURE PRP APPL 2/3 (GAUZE/BANDAGES/DRESSINGS) ×2 IMPLANT
BLADE HEX COATED 2.75 (ELECTRODE) ×2 IMPLANT
BLADE SURG 15 STRL LF DISP TIS (BLADE) ×1 IMPLANT
BLADE SURG 15 STRL SS (BLADE) ×2
BLADE SURG SZ10 CARB STEEL (BLADE) IMPLANT
CANISTER SUCTION 2500CC (MISCELLANEOUS) ×2 IMPLANT
CLIP TI WIDE RED SMALL 6 (CLIP) ×4 IMPLANT
DECANTER SPIKE VIAL GLASS SM (MISCELLANEOUS) ×2 IMPLANT
DISSECTOR ROUND CHERRY 3/8 STR (MISCELLANEOUS) ×2 IMPLANT
DRAPE PED LAPAROTOMY (DRAPES) ×2 IMPLANT
DRESSING SURGICEL FIBRLLR 1X2 (HEMOSTASIS) IMPLANT
DRSG SURGICEL FIBRILLAR 1X2 (HEMOSTASIS) ×2
ELECT REM PT RETURN 9FT ADLT (ELECTROSURGICAL) ×2
ELECTRODE REM PT RTRN 9FT ADLT (ELECTROSURGICAL) ×1 IMPLANT
GAUZE SPONGE 4X4 16PLY XRAY LF (GAUZE/BANDAGES/DRESSINGS) ×2 IMPLANT
GLOVE SURG SIGNA 7.5 PF LTX (GLOVE) ×4 IMPLANT
GOWN STRL REUS W/TWL LRG LVL3 (GOWN DISPOSABLE) ×1 IMPLANT
GOWN STRL REUS W/TWL XL LVL3 (GOWN DISPOSABLE) ×5 IMPLANT
HEMOSTAT SURGICEL 2X14 (HEMOSTASIS) ×1 IMPLANT
HOVERMATT SINGLE USE (MISCELLANEOUS) ×1 IMPLANT
KIT BASIN OR (CUSTOM PROCEDURE TRAY) ×2 IMPLANT
MARKER SKIN DUAL TIP RULER LAB (MISCELLANEOUS) ×2 IMPLANT
NS IRRIG 1000ML POUR BTL (IV SOLUTION) ×2 IMPLANT
PACK BASIC VI WITH GOWN DISP (CUSTOM PROCEDURE TRAY) ×2 IMPLANT
PENCIL BUTTON HOLSTER BLD 10FT (ELECTRODE) ×2 IMPLANT
SHEARS HARMONIC 9CM CVD (BLADE) ×2 IMPLANT
SPONGE GAUZE 4X4 12PLY (GAUZE/BANDAGES/DRESSINGS) IMPLANT
STAPLER VISISTAT 35W (STAPLE) IMPLANT
STRIP CLOSURE SKIN 1/2X4 (GAUZE/BANDAGES/DRESSINGS) ×2 IMPLANT
SUT MNCRL AB 4-0 PS2 18 (SUTURE) ×2 IMPLANT
SUT SILK 2 0 (SUTURE) ×2
SUT SILK 2-0 18XBRD TIE 12 (SUTURE) ×1 IMPLANT
SUT SILK 3 0 (SUTURE)
SUT SILK 3-0 18XBRD TIE 12 (SUTURE) IMPLANT
SUT VIC AB 3-0 SH 18 (SUTURE) ×3 IMPLANT
SUT VICRYL 2 0 18  UND BR (SUTURE)
SUT VICRYL 2 0 18 UND BR (SUTURE) IMPLANT
SYR BULB IRRIGATION 50ML (SYRINGE) ×2 IMPLANT
TOWEL OR 17X26 10 PK STRL BLUE (TOWEL DISPOSABLE) ×2 IMPLANT
YANKAUER SUCT BULB TIP 10FT TU (MISCELLANEOUS) ×2 IMPLANT

## 2013-07-19 NOTE — Transfer of Care (Signed)
Immediate Anesthesia Transfer of Care Note  Patient: Cindy Hoffman  Procedure(s) Performed: Procedure(s): THYROID LOBECTOMY (Right)  Patient Location: PACU  Anesthesia Type:General  Level of Consciousness: awake, alert , oriented and patient cooperative  Airway & Oxygen Therapy: Patient Spontanous Breathing and Patient connected to face mask oxygen  Post-op Assessment: Report given to PACU RN and Post -op Vital signs reviewed and stable  Post vital signs: Reviewed and stable  Complications: No apparent anesthesia complications

## 2013-07-19 NOTE — Op Note (Signed)
Cindy Hoffman, Cindy Hoffman               ACCOUNT NO.:  [id number]  MEDICAL RECORD NO.:  [id number]  LOCATION:  [facility]                         FACILITY:  [facility]  PHYSICIAN:  Coralie Keens, M.D. DATE OF BIRTH:  1975/08/08  DATE OF PROCEDURE:  07/19/2013 DATE OF DISCHARGE:                              OPERATIVE REPORT   PREOPERATIVE DIAGNOSIS:  Right thyroid nodule.  POSTOPERATIVE DIAGNOSIS:  Right thyroid nodule.  PROCEDURE:  Right thyroid lobectomy.  SURGEON:  Coralie Keens, M.D.  ANESTHESIA:  General.  ESTIMATED BLOOD LOSS:  Minimal.  INDICATIONS:  This is a 38 year old female who presents with a 2-cm nodule in right thyroid lobe.  Fine-needle aspiration of the nodule has been indeterminate.  Decision was made to proceed with right thyroid lobectomy.  PROCEDURE IN DETAIL:  The patient was brought to the operating room, identified as Cindy Hoffman.  She was placed supine on the operating room table and general anesthesia was induced.  The patient's neck was then prepped and draped in usual sterile fashion.  I made a transverse incision across the lower neck with a scalpel.  I took this down through the platysma with electrocautery.  Superior and inferior skin flaps were then created with the electrocautery.  A Mahorner retractor was then brought to the field and placed in the incision.  I then opened the patient's midline muscle tendon straps with electrocautery.  The strap muscles were then retracted laterally.  The thyroid gland was then identified and elevated.  I stayed right on the thyroid gland as I began my dissection.  The middle vein was identified and controlled with clips and Harmonic Scalpel.  I then took down the superior and inferior poles with clips and the Harmonic Scalpel as well.  I then stayed again right against the hilum of the thyroid gland.  I identified the inferior thyroid artery, which was clipped and transected with Harmonic Scalpel as well.  The  recurrent laryngeal nerve was easily identified as well. There was a moderate-sized nodule in the lower pole of the thyroid as well as a tiny nodule laterally.  Once I identified all the vessels, which were clipped appropriately, I then was able to dissect the gland off of the recurrent laryngeal nerve.  Once this was completed, the gland looked further up on to the trachea and I was able to take it off the rest of the trachea with the electrocautery.  I then transected the thyroid at the isthmus with the Harmonic Scalpel.  Once this was removed, the right thyroid lobe was sent to Pathology for evaluation.  I irrigated the neck thoroughly with saline.  Hemostasis appeared to be achieved.  I placed Fibrillar into the area of dissection.  I then closed the patient's midline with interrupted 3-0 Vicryl sutures.  I then closed the platysma with interrupted 3-0 Vicryl sutures as well.  I then closed the skin with a running 4-0 Monocryl. Steri-Strips, gauze, and tape were then applied.  The patient tolerated the procedure well.  All the counts were correct at the end of the procedure.  The patient was then extubated in the operating room and taken in a stable condition to the recovery room.  Coralie Keens, M.D.     DB/MEDQ  D:  07/19/2013  T:  07/19/2013  Job:  [id number]

## 2013-07-19 NOTE — Interval H&P Note (Signed)
History and Physical Interval Note: no change in H and P  07/19/2013 8:54 AM  Cindy Hoffman  has presented today for surgery, with the diagnosis of right thyroid nodule   The various methods of treatment have been discussed with the patient and family. After consideration of risks, benefits and other options for treatment, the patient has consented to  Procedure(s): THYROID LOBECTOMY (Right) as a surgical intervention .  The patient's history has been reviewed, patient examined, no change in status, stable for surgery.  I have reviewed the patient's chart and labs.  Questions were answered to the patient's satisfaction.     Gizzelle Lacomb A

## 2013-07-19 NOTE — Anesthesia Postprocedure Evaluation (Signed)
  Anesthesia Post-op Note  Patient: Cindy Hoffman  Procedure(s) Performed: Procedure(s) (LRB): THYROID LOBECTOMY (Right)  Patient Location: PACU  Anesthesia Type: General  Level of Consciousness: awake and alert   Airway and Oxygen Therapy: Patient Spontanous Breathing  Post-op Pain: mild  Post-op Assessment: Post-op Vital signs reviewed, Patient's Cardiovascular Status Stable, Respiratory Function Stable, Patent Airway and No signs of Nausea or vomiting  Last Vitals:  Filed Vitals:   07/19/13 1230  BP: 145/66  Pulse: 78  Temp: 37.3 C  Resp: 14    Post-op Vital Signs: stable   Complications: No apparent anesthesia complications

## 2013-07-19 NOTE — Op Note (Signed)
THYROID LOBECTOMY  Procedure Note  Cindy Hoffman 07/19/2013   Pre-op Diagnosis: right thyroid nodule      Post-op Diagnosis: same  Procedure(s):  RIGHT THYROID LOBECTOMY  Surgeon(s): Harl Bowie, MD  Anesthesia: General  Staff:  Circulator: Felix Ahmadi, RN Scrub Person: Wynell Balloon, CST  Estimated Blood Loss: Minimal               Specimens: sent to path          [facility]   Date: 07/19/2013  Time: 11:22 AM

## 2013-07-19 NOTE — Preoperative (Signed)
Beta Blockers   Reason not to administer Beta Blockers:Not Applicable 

## 2013-07-19 NOTE — Anesthesia Preprocedure Evaluation (Signed)
Anesthesia Evaluation  Patient identified by MRN, date of birth, ID band Patient awake    Reviewed: Allergy & Precautions, H&P , NPO status , Patient's Chart, lab work & pertinent test results  Airway Mallampati: II TM Distance: >3 FB Neck ROM: full    Dental no notable dental hx. (+) Teeth Intact, Dental Advisory Given   Pulmonary asthma , sleep apnea , Current Smoker,  Moderate to severe asthma breath sounds clear to auscultation  Pulmonary exam normal       Cardiovascular Rhythm:regular Rate:Normal  palpitations   Neuro/Psych negative neurological ROS  negative psych ROS   GI/Hepatic negative GI ROS, Neg liver ROS, GERD-  Medicated and Controlled,  Endo/Other  negative endocrine ROSMorbid obesity  Renal/GU negative Renal ROS  negative genitourinary   Musculoskeletal   Abdominal (+) + obese,   Peds  Hematology negative hematology ROS (+)   Anesthesia Other Findings   Reproductive/Obstetrics negative OB ROS                           Anesthesia Physical Anesthesia Plan  ASA: III  Anesthesia Plan: General   Post-op Pain Management:    Induction: Intravenous  Airway Management Planned: Oral ETT  Additional Equipment:   Intra-op Plan:   Post-operative Plan: Extubation in OR  Informed Consent: I have reviewed the patients History and Physical, chart, labs and discussed the procedure including the risks, benefits and alternatives for the proposed anesthesia with the patient or authorized representative who has indicated his/her understanding and acceptance.   Dental Advisory Given  Plan Discussed with: CRNA and Surgeon  Anesthesia Plan Comments:         Anesthesia Quick Evaluation

## 2013-07-20 LAB — BASIC METABOLIC PANEL
BUN: 6 mg/dL (ref 6–23)
CHLORIDE: 103 meq/L (ref 96–112)
CO2: 24 meq/L (ref 19–32)
Calcium: 8.7 mg/dL (ref 8.4–10.5)
Creatinine, Ser: 0.59 mg/dL (ref 0.50–1.10)
GFR calc Af Amer: >90 mL/min (ref >90)
GFR calc non Af Amer: >90 mL/min (ref >90)
Glucose, Bld: 125 mg/dL — ABNORMAL HIGH (ref 70–99)
Potassium: 3.9 mEq/L (ref 3.7–5.3)
SODIUM: 139 meq/L (ref 137–147)

## 2013-07-20 LAB — CBC
HCT: 36.2 % (ref 36.0–46.0)
Hemoglobin: 11.6 g/dL — ABNORMAL LOW (ref 12.0–15.0)
MCH: 26.5 pg (ref 26.0–34.0)
MCHC: 32 g/dL (ref 30.0–36.0)
MCV: 82.6 fL (ref 78.0–100.0)
PLATELETS: 429 10*3/uL — AB (ref 150–400)
RBC: 4.38 MIL/uL (ref 3.87–5.11)
RDW: 15.3 % (ref 11.5–15.5)
WBC: 11.6 10*3/uL — AB (ref 4.0–10.5)

## 2013-07-20 MED ORDER — OXYCODONE-ACETAMINOPHEN 5-325 MG PO TABS: 1.0000 | ORAL_TABLET | ORAL | Status: DC | PRN

## 2013-07-20 NOTE — Discharge Summary (Signed)
Physician Discharge Summary  Patient ID: NYAJA DUBUQUE MRN: [id number] DOB/AGE: June 29, 1975 38 y.o.  Admit date: 07/19/2013 Discharge date: 07/20/2013  Admission Diagnoses:  Discharge Diagnoses:  Active Problems:   Thyroid nodule   Discharged Condition: good  Hospital Course: UNEVENTFUL POSTOP RECOVERY  Consults: None  Significant Diagnostic Studies:   Treatments: surgery: right thyroid lobectomy  Discharge Exam: Blood pressure 119/81, pulse 65, temperature 97 F (36.1 C), temperature source Axillary, resp. rate 18, height 5\' 4"  (1.626 m), weight 347 lb (157.398 kg), last menstrual period 06/22/2013, SpO2 97.00%. General appearance: alert and no distress Neck: no adenopathy, no carotid bruit, no JVD, supple, symmetrical, trachea midline, thyroid not enlarged, symmetric, no tenderness/mass/nodules and no neck hematoma  Disposition: 01-Home or Self Care     Medication List    ASK your doctor about these medications       albuterol 108 (90 BASE) MCG/ACT inhaler  Commonly known as:  PROVENTIL HFA;VENTOLIN HFA  Inhale 1 puff into the lungs every 6 (six) hours as needed for wheezing or shortness of breath.     DULERA 200-5 MCG/ACT Aero  Generic drug:  mometasone-formoterol  Inhale 2 puffs into the lungs 2 (two) times daily.     famotidine 20 MG tablet  Commonly known as:  PEPCID  Take 20 mg by mouth at bedtime.     LORazepam 1 MG tablet  Commonly known as:  ATIVAN  Take 1 mg by mouth every 8 (eight) hours as needed for anxiety.           Follow-up Information   Follow up with [facility], MD. Call in 2 weeks.   Specialty:  General Surgery   Contact information:   [address] [place] [address] [phone]       Signed: Harl Bowie 07/20/2013, 6:48 AM  2

## 2013-07-20 NOTE — Progress Notes (Signed)
Patient ID: Cindy Hoffman, female   DOB: 1976/02/01, 38 y.o.   MRN: [id number]  Complains of incisional pain  Incision clean, no hematoma  Plan: discharge

## 2013-07-20 NOTE — Discharge Instructions (Signed)
[facility], [place] [id number]  THYROID/ PARATHYROID SURGERY: POST OP INSTRUCTIONS  Always review your discharge instruction sheet given to you by the facility where your surgery was performed.  IF YOU HAVE DISABILITY OR FAMILY LEAVE FORMS, YOU MUST BRING THEM TO THE OFFICE FOR PROCESSING.  PLEASE DO NOT GIVE THEM TO YOUR DOCTOR.  1. A prescription for pain medication may be given to you upon discharge.  Take your pain medication as prescribed, if needed.  If narcotic pain medicine is not needed, then you may take acetaminophen (Tylenol) or ibuprofen (Advil) as needed. 2. Take your usually prescribed medications unless otherwise directed. 3. If you need a refill on your pain medication, please contact your pharmacy. They will contact our office to request authorization.  Prescriptions will not be filled after 5pm or on week-ends. 4. You should follow a light diet the first 24 hours after arrival home, such as soup and crackers, etc.  Be sure to include lots of fluids daily.  Resume your normal diet the day after surgery. 5. Most patients will experience some swelling and bruising on the chest and neck area.  Ice packs will help.  Swelling and bruising can take several days to resolve.  6. It is common to experience some constipation if taking pain medication after surgery.  Increasing fluid intake and taking a stool softener will usually help or prevent this problem from occurring.  A mild laxative (Milk of Magnesia or Miralax) should be taken according to package directions if there are no bowel movements after 48 hours. 7. Unless discharge instructions indicate otherwise, you may remove your bandages 24-48 hours after surgery, and you may shower at that time.  You may have steri-strips (small skin tapes) in place directly over the incision.  These strips should be left on the skin for 7-10 days.  If your surgeon used skin glue on the incision, you may shower in 24 hours.  The  glue will flake off over the next 2-3 weeks.  Any sutures or staples will be removed at the office during your follow-up visit. 8. ACTIVITIES:  You may resume regular (light) daily activities beginning the next day--such as daily self-care, walking, climbing stairs--gradually increasing activities as tolerated.  You may have sexual intercourse when it is comfortable.  Refrain from any heavy lifting or straining until approved by your doctor. a. You may drive when you no longer are taking prescription pain medication, you can comfortably wear a seatbelt, and you can safely maneuver your car and apply brakes b. RETURN TO WORK:  __WHEN YOU ARE NO LONGER TAKING NARCOTIC PAIN MEDS________________________________________________________ 9. You should see your doctor in the office for a follow-up appointment approximately two weeks after your surgery.  Make sure that you call for this appointment within a day or two after you arrive home to insure a convenient appointment time. 10. OTHER INSTRUCTIONS: ____________________________________________________________________________ _________________________________________________________________________________________________________________ _________________________________________________________________________________________________________________   WHEN TO CALL YOUR DOCTOR: 1. Fever over 101.0 2. Inability to urinate 3. Nausea and/or vomiting 4. Extreme swelling or bruising 5. Continued bleeding from incision. 6. Increased pain, redness, or drainage from the incision. 7. Difficulty swallowing or breathing 8. Muscle cramping or spasms. 9. Numbness or tingling in hands or feet or around lips.  The clinic staff is available to answer your questions during regular business hours.  Please dont hesitate to call and ask to speak to one of the nurses if you have concerns.  For further questions, please  visit [email or web address]

## 2013-07-20 NOTE — Progress Notes (Signed)
Patient advanced to a regular diet per previous electronic order by MD Jodi Geralds, Veronda Prude RN 07-20-2013 7:33am

## 2013-07-23 ENCOUNTER — Encounter (HOSPITAL_COMMUNITY): Payer: Self-pay | Admitting: Surgery

## 2013-07-25 ENCOUNTER — Telehealth (INDEPENDENT_AMBULATORY_CARE_PROVIDER_SITE_OTHER): Payer: Self-pay

## 2013-07-25 NOTE — Telephone Encounter (Signed)
Patient asking for refill of percocet 5/325mg  , rates pain 7 .advised her it will be tomorrow before we can get an order form DR. Ninfa Linden and she or her dad will need to pick up. Patient verbalized understanding

## 2013-07-26 ENCOUNTER — Encounter (INDEPENDENT_AMBULATORY_CARE_PROVIDER_SITE_OTHER): Payer: Self-pay | Admitting: Surgery

## 2013-07-26 NOTE — Telephone Encounter (Signed)
LMOM for patient to come by the office that we have a written Rx for Percocet 5/325 #25 1 po q 4 hr prn pain. Dr Rosendo Gros wrote the Rx

## 2013-07-26 NOTE — Telephone Encounter (Signed)
If someone can write her for Percocet 5/325, just #25 1 po q 4 hrs prn pain

## 2013-08-03 ENCOUNTER — Encounter (HOSPITAL_COMMUNITY): Payer: Self-pay | Admitting: Emergency Medicine

## 2013-08-03 ENCOUNTER — Emergency Department (HOSPITAL_COMMUNITY)
Admission: EM | Admit: 2013-08-03 | Discharge: 2013-08-03 | Disposition: A | Discharge status: Home / Self Care | Payer: Medicaid Other | Attending: Emergency Medicine | Admitting: Emergency Medicine

## 2013-08-03 DIAGNOSIS — E669 Obesity, unspecified: Secondary | ICD-10-CM | POA: Insufficient documentation

## 2013-08-03 DIAGNOSIS — IMO0002 Reserved for concepts with insufficient information to code with codable children: Secondary | ICD-10-CM | POA: Insufficient documentation

## 2013-08-03 DIAGNOSIS — K1370 Unspecified lesions of oral mucosa: Secondary | ICD-10-CM | POA: Insufficient documentation

## 2013-08-03 DIAGNOSIS — M791 Myalgia, unspecified site: Secondary | ICD-10-CM

## 2013-08-03 DIAGNOSIS — F17200 Nicotine dependence, unspecified, uncomplicated: Secondary | ICD-10-CM | POA: Insufficient documentation

## 2013-08-03 DIAGNOSIS — F411 Generalized anxiety disorder: Secondary | ICD-10-CM | POA: Insufficient documentation

## 2013-08-03 DIAGNOSIS — K219 Gastro-esophageal reflux disease without esophagitis: Secondary | ICD-10-CM | POA: Insufficient documentation

## 2013-08-03 DIAGNOSIS — J069 Acute upper respiratory infection, unspecified: Secondary | ICD-10-CM | POA: Insufficient documentation

## 2013-08-03 DIAGNOSIS — Z8781 Personal history of (healed) traumatic fracture: Secondary | ICD-10-CM | POA: Insufficient documentation

## 2013-08-03 DIAGNOSIS — M79609 Pain in unspecified limb: Secondary | ICD-10-CM | POA: Insufficient documentation

## 2013-08-03 DIAGNOSIS — Z79899 Other long term (current) drug therapy: Secondary | ICD-10-CM | POA: Insufficient documentation

## 2013-08-03 DIAGNOSIS — J45901 Unspecified asthma with (acute) exacerbation: Secondary | ICD-10-CM | POA: Insufficient documentation

## 2013-08-03 DIAGNOSIS — Z902 Acquired absence of lung [part of]: Secondary | ICD-10-CM | POA: Insufficient documentation

## 2013-08-03 DIAGNOSIS — Z8679 Personal history of other diseases of the circulatory system: Secondary | ICD-10-CM | POA: Insufficient documentation

## 2013-08-03 LAB — D-DIMER, QUANTITATIVE (NOT AT ARMC): D-Dimer, Quant: 0.36 ug/mL-FEU (ref 0.00–0.48)

## 2013-08-03 LAB — RAPID STREP SCREEN (MED CTR MEBANE ONLY): STREPTOCOCCUS, GROUP A SCREEN (DIRECT): NEGATIVE

## 2013-08-03 MED ORDER — IBUPROFEN 800 MG PO TABS: 800.0000 mg | ORAL_TABLET | Freq: Three times a day (TID) | ORAL | Status: DC

## 2013-08-03 NOTE — ED Notes (Signed)
Pt in c/o bilateral leg pain, worse to left leg over the last few days, also since last night developed cough and wheezing, recent thyroid surgery, also sore throat, fever last night, states her daughter had similar cold symptoms last week

## 2013-08-03 NOTE — Discharge Instructions (Signed)
Call for a follow up appointment with a Family or Primary Care Provider.  Return to the Emergency if Symptoms worsen.   Take medication as prescribed.  Salt water gargles for sore throat. Stop smoking. Drink plenty of fluids

## 2013-08-03 NOTE — ED Notes (Signed)
Pt requesting to take her own albuterol inhaler- notified PA. PA ok with pt using her own albuterol inhaler.

## 2013-08-03 NOTE — ED Provider Notes (Signed)
CSN: [id number]     Arrival date & time 08/03/13  1244 History   First MD Initiated Contact with Patient 08/03/13 1341     Chief Complaint  Patient presents with  . URI  . Leg Pain     (Consider location/radiation/quality/duration/timing/severity/associated sxs/prior Treatment) HPI Comments: Cindy Hoffman is a 38 y.o. female with a past medical history of obesity, asthma, tobacco abuse, thyroid nodule s/p lobectomy, presenting the Emergency Department with a chief complaint of sore throat, bilateral lower extremity pain.  She reports a fever of 100.9 last night, denies antipyretic use.  She reports wheezing, cough, nasal congestion and sore throat.  She reports using her inhaler without full resolution of symptoms.  She family members with similar complaints. She reports bilateral lower leg pain.  She reports woke up to a "charly horse" last night in her left calf.  She reports right lower extremity discomfort as well. She denies lower extremity swelling. No recent travel, family history or personal history of DVT/PE, lower extremity swelling, cancer, or exogenous estrogen. She reports she had thyroid lobectomy 2 weeks ago.   Patient is a 38 y.o. female presenting with URI and leg pain. The history is provided by the patient. No language interpreter was used.  URI Presenting symptoms: congestion, cough, fever and sore throat   Presenting symptoms: no ear pain   Associated symptoms: myalgias and wheezing   Leg Pain Associated symptoms: fever     Past Medical History  Diagnosis Date  . Asthma   . Migraine   . Obesity   . Thyroid disease   . Fibromyalgia   . Anxiety   . GERD (gastroesophageal reflux disease)   . Fracture of right foot   . Anxiety   . Complication of anesthesia     "STATES WAKES UP AND CANT BREATHE AND NEEDS BREATHING TREATMENT  IN PACU"  . Dysrhythmia     palpitations- "from thyroid"  . Sleep apnea      NO CPAP--could not tolerates mask  . Viral respiratory  infection 06/26/13    ? influenza   Past Surgical History  Procedure Laterality Date  . Tubal ligation    . Nasal septum surgery    . Wisdom tooth extraction    . Thyroid lobectomy Right 07/19/2013    Procedure: THYROID LOBECTOMY;  Surgeon: Harl Bowie, MD;  Location: [facility];  Service: General;  Laterality: Right;   Family History  Problem Relation Age of Onset  . Hypertension Mother   . Hypertension Father   . Diabetes Sister   . Cancer Paternal Grandmother     breast   History  Substance Use Topics  . Smoking status: Light Tobacco Smoker -- 0.25 packs/day for 20 years    Types: Cigarettes  . Smokeless tobacco: Never Used  . Alcohol Use: No     Comment: socially   OB History   Grav Para Term Preterm Abortions TAB SAB Ect Mult Living                 Review of Systems  Constitutional: Positive for fever and chills.  HENT: Positive for congestion, mouth sores and sore throat. Negative for ear pain.   Respiratory: Positive for cough and wheezing.   Cardiovascular: Negative for chest pain, palpitations and leg swelling.  Gastrointestinal: Negative for nausea, vomiting, abdominal pain and diarrhea.  Musculoskeletal: Positive for myalgias. Negative for gait problem and joint swelling.  Skin: Negative for color change and rash.  Allergies  Beta adrenergic blockers  Home Medications   Current Outpatient Rx  Name  Route  Sig  Dispense  Refill  . albuterol (PROVENTIL HFA;VENTOLIN HFA) 108 (90 BASE) MCG/ACT inhaler   Inhalation   Inhale 1 puff into the lungs every 6 (six) hours as needed for wheezing or shortness of breath.         . calcium carbonate (TUMS EX) 750 MG chewable tablet   Oral   Chew 3 tablets by mouth daily as needed for heartburn.         . famotidine (PEPCID) 20 MG tablet   Oral   Take 20 mg by mouth at bedtime.          Marland Kitchen LORazepam (ATIVAN) 1 MG tablet   Oral   Take 1 mg by mouth every 8 (eight) hours as needed for anxiety.          . mometasone-formoterol (DULERA) 200-5 MCG/ACT AERO   Inhalation   Inhale 2 puffs into the lungs 2 (two) times daily.         Marland Kitchen oxyCODONE-acetaminophen (PERCOCET/ROXICET) 5-325 MG per tablet   Oral   Take 1-2 tablets by mouth every 4 (four) hours as needed for moderate pain.   50 tablet   0    BP 124/73  Pulse 100  Temp(Src) 98.7 F (37.1 C) (Oral)  Resp 20  SpO2 100% Physical Exam  Nursing note and vitals reviewed. Constitutional: She appears well-developed and well-nourished. No distress.  HENT:  Head: Normocephalic and atraumatic.  Right Ear: Tympanic membrane and external ear normal. No middle ear effusion.  Left Ear: Tympanic membrane and external ear normal.  No middle ear effusion.  Nose: Rhinorrhea present.  Mouth/Throat: Uvula is midline and mucous membranes are normal. No oral lesions. No trismus in the jaw. Posterior oropharyngeal erythema present. No posterior oropharyngeal edema or tonsillar abscesses.  Eyes: EOM are normal. Pupils are equal, round, and reactive to light. Right eye exhibits no discharge. Left eye exhibits no discharge.  Neck: Normal range of motion. Neck supple.    Well healing linear scar to lower neck, partially covered by steri-strips.  Non-tender to palpation, no drainage.  Cardiovascular: Normal rate and regular rhythm.   No murmur heard. Pulses:      Dorsalis pedis pulses are 2+ on the right side, and 2+ on the left side.  Pulmonary/Chest: Effort normal and breath sounds normal. Not tachypneic. No respiratory distress. She has no decreased breath sounds. She has no wheezes. She has no rhonchi. She has no rales.  Patient is able to speak in complete sentences. Wheezing clears with cough.  Abdominal: Soft. There is no tenderness. There is no rigidity and no guarding.  Musculoskeletal:  Bilateral lower extremity tenderness to palpation, no swelling, no increase in warmth to touch, no erythema or ecchymosis.  Full ROM.  Lymphadenopathy:         Head (right side): No submental, no submandibular, no tonsillar, no preauricular, no posterior auricular and no occipital adenopathy present.       Head (left side): No submental, no submandibular, no tonsillar, no preauricular, no posterior auricular and no occipital adenopathy present.    She has no cervical adenopathy.  Skin: Skin is warm and dry. No bruising, no ecchymosis and no rash noted. She is not diaphoretic. No erythema.  Psychiatric: She has a normal mood and affect. Her behavior is normal. Thought content normal.    ED Course  Procedures (including critical care time) Labs  Review Labs Reviewed  RAPID STREP SCREEN  CULTURE, GROUP A STREP  D-DIMER, QUANTITATIVE    Imaging Review No results found.   EKG Interpretation None      MDM   Final diagnoses:  Upper respiratory infection  Myalgia   Patients symptoms are consistent with URI, likely viral etiology. History of fever and sore throat, afebrile in ED, rapid strep sent.  Wheezing clears with cough, oxygen saturation 100% RA.  Also complains of lower extremity pain, low suspicion of a DVT.  Discussed with Dr. Mingo Amber, after his evaluation of the patient states likely myalgias, and d-dimer is sufficient. Strep negative and D-dimer 0.36, neagtive. Pt will be discharged with symptomatic treatment.  Verbalizes understanding and is agreeable with plan. Pt is hemodynamically stable & in NAD prior to dc.  Meds given in ED:  Medications - No data to display  New Prescriptions   IBUPROFEN (ADVIL,MOTRIN) 800 MG TABLET    Take 1 tablet (800 mg total) by mouth 3 (three) times daily. Take with food        Lorrine Kin, PA-C 08/03/13 1535

## 2013-08-03 NOTE — ED Provider Notes (Signed)
Medical screening examination/treatment/procedure(s) were conducted as a shared visit with non-physician practitioner(s) and myself.  I personally evaluated the patient during the encounter.   EKG Interpretation None       Here with URI symptoms and bilateral calf pain. Here, HEENT exam benign. Legs with some calf tenderness, normal pulses. Recent surgery, cannot PERC - D-dimer negative. Stable for discharge.  Osvaldo Shipper, MD 08/03/13 [phone]

## 2013-08-03 NOTE — ED Notes (Signed)
Pt states she has had really bad leg cramps the past few days.

## 2013-08-05 LAB — CULTURE, GROUP A STREP

## 2013-08-13 ENCOUNTER — Ambulatory Visit (INDEPENDENT_AMBULATORY_CARE_PROVIDER_SITE_OTHER): Payer: Medicaid Other | Admitting: Surgery

## 2013-08-13 ENCOUNTER — Encounter (INDEPENDENT_AMBULATORY_CARE_PROVIDER_SITE_OTHER): Payer: Self-pay | Admitting: Surgery

## 2013-08-13 VITALS — BP 138/82 | HR 105 | Temp 97.2°F | Resp 16 | Ht 64.0 in | Wt 352.2 lb

## 2013-08-13 DIAGNOSIS — Z09 Encounter for follow-up examination after completed treatment for conditions other than malignant neoplasm: Secondary | ICD-10-CM

## 2013-08-13 NOTE — Progress Notes (Signed)
Subjective:     Patient ID: Cindy Hoffman, female   DOB: 1975/11/20, 38 y.o.   MRN: [id number]  HPI She is here for first postop visit status post right thyroid lobectomy. She is doing well has no complaints  Review of Systems     Objective:   Physical Exam On exam, her incision is healing well. 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 I will see her back as needed

## 2013-08-29 ENCOUNTER — Encounter (INDEPENDENT_AMBULATORY_CARE_PROVIDER_SITE_OTHER): Payer: Self-pay

## 2013-09-16 ENCOUNTER — Emergency Department (HOSPITAL_COMMUNITY)
Admission: EM | Admit: 2013-09-16 | Discharge: 2013-09-16 | Disposition: A | Discharge status: Home / Self Care | Payer: Medicaid Other | Attending: Emergency Medicine | Admitting: Emergency Medicine

## 2013-09-16 ENCOUNTER — Encounter (HOSPITAL_COMMUNITY): Payer: Self-pay | Admitting: Emergency Medicine

## 2013-09-16 ENCOUNTER — Emergency Department (HOSPITAL_COMMUNITY): Payer: Medicaid Other

## 2013-09-16 DIAGNOSIS — I499 Cardiac arrhythmia, unspecified: Secondary | ICD-10-CM | POA: Insufficient documentation

## 2013-09-16 DIAGNOSIS — R079 Chest pain, unspecified: Secondary | ICD-10-CM | POA: Insufficient documentation

## 2013-09-16 DIAGNOSIS — Z8781 Personal history of (healed) traumatic fracture: Secondary | ICD-10-CM | POA: Insufficient documentation

## 2013-09-16 DIAGNOSIS — J45909 Unspecified asthma, uncomplicated: Secondary | ICD-10-CM | POA: Insufficient documentation

## 2013-09-16 DIAGNOSIS — E079 Disorder of thyroid, unspecified: Secondary | ICD-10-CM | POA: Insufficient documentation

## 2013-09-16 DIAGNOSIS — F17200 Nicotine dependence, unspecified, uncomplicated: Secondary | ICD-10-CM | POA: Insufficient documentation

## 2013-09-16 DIAGNOSIS — R112 Nausea with vomiting, unspecified: Secondary | ICD-10-CM | POA: Insufficient documentation

## 2013-09-16 DIAGNOSIS — Z8619 Personal history of other infectious and parasitic diseases: Secondary | ICD-10-CM | POA: Insufficient documentation

## 2013-09-16 DIAGNOSIS — Z79899 Other long term (current) drug therapy: Secondary | ICD-10-CM | POA: Insufficient documentation

## 2013-09-16 DIAGNOSIS — K219 Gastro-esophageal reflux disease without esophagitis: Secondary | ICD-10-CM

## 2013-09-16 LAB — HEPATIC FUNCTION PANEL
ALT: 11 U/L (ref 0–35)
AST: 12 U/L (ref 0–37)
Albumin: 3.5 g/dL (ref 3.5–5.2)
Alkaline Phosphatase: 82 U/L (ref 39–117)
Bilirubin, Direct: <0.2 mg/dL (ref 0.0–0.3)
TOTAL PROTEIN: 7.2 g/dL (ref 6.0–8.3)
Total Bilirubin: 0.3 mg/dL (ref 0.3–1.2)

## 2013-09-16 LAB — BASIC METABOLIC PANEL
BUN: 11 mg/dL (ref 6–23)
CALCIUM: 9.6 mg/dL (ref 8.4–10.5)
CO2: 23 mEq/L (ref 19–32)
Chloride: 102 mEq/L (ref 96–112)
Creatinine, Ser: 0.63 mg/dL (ref 0.50–1.10)
GFR calc Af Amer: >90 mL/min (ref >90)
Glucose, Bld: 103 mg/dL — ABNORMAL HIGH (ref 70–99)
Potassium: 3.9 mEq/L (ref 3.7–5.3)
SODIUM: 140 meq/L (ref 137–147)

## 2013-09-16 LAB — CBC
HCT: 36.8 % (ref 36.0–46.0)
HEMOGLOBIN: 12.2 g/dL (ref 12.0–15.0)
MCH: 27.5 pg (ref 26.0–34.0)
MCHC: 33.2 g/dL (ref 30.0–36.0)
MCV: 82.9 fL (ref 78.0–100.0)
PLATELETS: 378 10*3/uL (ref 150–400)
RBC: 4.44 MIL/uL (ref 3.87–5.11)
RDW: 15.1 % (ref 11.5–15.5)
WBC: 10.1 10*3/uL (ref 4.0–10.5)

## 2013-09-16 LAB — HEPATITIS PANEL, ACUTE
HCV Ab: NEGATIVE
HEP B C IGM: NONREACTIVE
Hep A IgM: NONREACTIVE
Hepatitis B Surface Ag: NEGATIVE

## 2013-09-16 LAB — TROPONIN I

## 2013-09-16 LAB — LIPASE, BLOOD: LIPASE: 26 U/L (ref 11–59)

## 2013-09-16 LAB — I-STAT TROPONIN, ED: Troponin i, poc: 0.01 ng/mL (ref 0.00–0.08)

## 2013-09-16 MED ORDER — FAMOTIDINE IN NACL 20-0.9 MG/50ML-% IV SOLN
20.0000 mg | Freq: Once | INTRAVENOUS | Status: AC
  Administered 2013-09-16: 20 mg via INTRAVENOUS
  Filled 2013-09-16: qty 50

## 2013-09-16 MED ORDER — HYDROMORPHONE HCL PF 1 MG/ML IJ SOLN: 1.0000 mg | Freq: Once | INTRAMUSCULAR | Status: DC

## 2013-09-16 MED ORDER — FENTANYL CITRATE 0.05 MG/ML IJ SOLN
50.0000 ug | Freq: Once | INTRAMUSCULAR | Status: AC
  Administered 2013-09-16: 50 ug via INTRAVENOUS
  Filled 2013-09-16: qty 2

## 2013-09-16 MED ORDER — GI COCKTAIL ~~LOC~~
30.0000 mL | Freq: Once | ORAL | Status: AC
  Administered 2013-09-16: 30 mL via ORAL
  Filled 2013-09-16: qty 30

## 2013-09-16 MED ORDER — ONDANSETRON HCL 4 MG/2ML IJ SOLN
4.0000 mg | Freq: Once | INTRAMUSCULAR | Status: AC
  Administered 2013-09-16: 4 mg via INTRAVENOUS
  Filled 2013-09-16: qty 2

## 2013-09-16 MED ORDER — SODIUM CHLORIDE 0.9 % IV BOLUS (SEPSIS)
1000.0000 mL | Freq: Once | INTRAVENOUS | Status: AC
  Administered 2013-09-16: 1000 mL via INTRAVENOUS

## 2013-09-16 MED ORDER — ESOMEPRAZOLE MAGNESIUM 40 MG PO CPDR: 40.0000 mg | DELAYED_RELEASE_CAPSULE | Freq: Every day | ORAL | Status: DC

## 2013-09-16 NOTE — ED Notes (Signed)
Blood draw attempt x 2. Unsuccessful. Another staff to attempt.

## 2013-09-16 NOTE — ED Notes (Signed)
Pt reports left sided chest pain radiating to back. Describes the pain as tightness, reports feeling light headed like she is going to pass out. Onset at midnight tonight while riding in her car.

## 2013-09-16 NOTE — ED Provider Notes (Signed)
CSN: [id number]     Arrival date & time 09/16/13  0255 History   First MD Initiated Contact with Patient 09/16/13 0457     Chief Complaint  Patient presents with  . Chest Pain     (Consider location/radiation/quality/duration/timing/severity/associated sxs/prior Treatment) HPI  This patient is a 38 yo morbidly obese woman with OSA, anxiety, fibromyalgia, history of migraine headaches, GERD and asthma.  She presents with centrally located aching chest pain which began around midnight (6 hrs ago) and has persisted. The patient says she has had similar episodes of pain in the past but, typically, her sx resolve when she takes Tums.   She tried taking Pepcid, Tums and BC Powder prior to coming to the ED. This helped, somewhat, to relieve her pain but, the pain persists. At it's worst, pain was 100 on a 0 to 10 scale. No nausea, vomiting, diarrhea, cough, SOB. Pain radiates to the back between the shoulder blades.   Patient denies any history of CAD or VTE. She has never had a functional cardiac evaluation. She smokes 1 cigarette per day and this is her only known CAD RF.   Past Medical History  Diagnosis Date  . Asthma   . Migraine   . Obesity   . Thyroid disease   . Fibromyalgia   . Anxiety   . GERD (gastroesophageal reflux disease)   . Fracture of right foot   . Anxiety   . Complication of anesthesia     "STATES WAKES UP AND CANT BREATHE AND NEEDS BREATHING TREATMENT  IN PACU"  . Dysrhythmia     palpitations- "from thyroid"  . Sleep apnea      NO CPAP--could not tolerates mask  . Viral respiratory infection 06/26/13    ? influenza   Past Surgical History  Procedure Laterality Date  . Tubal ligation    . Nasal septum surgery    . Wisdom tooth extraction    . Thyroid lobectomy Right 07/19/2013    Procedure: THYROID LOBECTOMY;  Surgeon: Harl Bowie, MD;  Location: [facility];  Service: General;  Laterality: Right;   Family History  Problem Relation Age of Onset  .  Hypertension Mother   . Hypertension Father   . Diabetes Sister   . Cancer Paternal Grandmother     breast   History  Substance Use Topics  . Smoking status: Light Tobacco Smoker -- 0.25 packs/day for 20 years    Types: Cigarettes  . Smokeless tobacco: Never Used  . Alcohol Use: No     Comment: socially   OB History   Grav Para Term Preterm Abortions TAB SAB Ect Mult Living                 Review of Systems Ten point review of symptoms performed and is negative with the exception of symptoms noted above.     Allergies  Beta adrenergic blockers  Home Medications   Prior to Admission medications   Medication Sig Start Date End Date Taking? Authorizing Provider  albuterol (PROVENTIL HFA;VENTOLIN HFA) 108 (90 BASE) MCG/ACT inhaler Inhale 1 puff into the lungs every 6 (six) hours as needed for wheezing or shortness of breath.   Yes Historical Provider, MD  Aspirin-Salicylamide-Caffeine (BC HEADACHE POWDER PO) Take 1 Package by mouth daily as needed (pain).   Yes Historical Provider, MD  calcium carbonate (TUMS EX) 750 MG chewable tablet Chew 3 tablets by mouth daily as needed for heartburn.   Yes Historical Provider, MD  famotidine (  PEPCID) 20 MG tablet Take 20 mg by mouth at bedtime.    Yes Historical Provider, MD  ibuprofen (ADVIL,MOTRIN) 800 MG tablet Take 1 tablet (800 mg total) by mouth 3 (three) times daily. Take with food 08/03/13  Yes Lauren Burnetta Sabin, PA-C  levothyroxine (SYNTHROID, LEVOTHROID) 50 MCG tablet Take 50 mcg by mouth daily before breakfast.   Yes Historical Provider, MD  LORazepam (ATIVAN) 1 MG tablet Take 1 mg by mouth every 8 (eight) hours as needed for anxiety.   Yes Historical Provider, MD  mometasone-formoterol (DULERA) 200-5 MCG/ACT AERO Inhale 2 puffs into the lungs 2 (two) times daily.   Yes Historical Provider, MD   BP 134/70  Pulse 70  Temp(Src) 98.4 F (36.9 C) (Oral)  Resp 27  Ht 5\' 4"  (1.626 m)  Wt 350 lb (158.759 kg)  BMI 60.05 kg/m2  SpO2  98%  LMP 09/13/2013 Physical Exam Gen: well developed and well nourished appearing Head: NCAT Eyes: PERL, EOMI Nose: no epistaixis or rhinorrhea Mouth/throat: mucosa is moist and pink Neck: supple, no stridor Lungs: CTA B, no wheezing, rhonchi or rales CV: RRR, no murmur, extremities appear well perfused.  Abd: soft, notender, nondistended Back: no ttp, no cva ttp Skin: warm and dry Ext: normal to inspection, no dependent edema Neuro: CN ii-xii grossly intact, no focal deficits Psyche; normal affect,  calm and cooperative.   ED Course  Procedures (including critical care time) Labs Review  Results for orders placed during the hospital encounter of 09/16/13 (from the past 24 hour(s))  CBC     Status: None   Collection Time    09/16/13  4:00 AM      Result Value Ref Range   WBC 10.1  4.0 - 10.5 K/uL   RBC 4.44  3.87 - 5.11 MIL/uL   Hemoglobin 12.2  12.0 - 15.0 g/dL   HCT 36.8  36.0 - 46.0 %   MCV 82.9  78.0 - 100.0 fL   MCH 27.5  26.0 - 34.0 pg   MCHC 33.2  30.0 - 36.0 g/dL   RDW 15.1  11.5 - 15.5 %   Platelets 378  150 - 400 K/uL  BASIC METABOLIC PANEL     Status: Abnormal   Collection Time    09/16/13  4:00 AM      Result Value Ref Range   Sodium 140  137 - 147 mEq/L   Potassium 3.9  3.7 - 5.3 mEq/L   Chloride 102  96 - 112 mEq/L   CO2 23  19 - 32 mEq/L   Glucose, Bld 103 (*) 70 - 99 mg/dL   BUN 11  6 - 23 mg/dL   Creatinine, Ser 0.63  0.50 - 1.10 mg/dL   Calcium 9.6  8.4 - 10.5 mg/dL   GFR calc non Af Amer >90  >90 mL/min   GFR calc Af Amer >90  >90 mL/min  I-STAT TROPOININ, ED     Status: None   Collection Time    09/16/13  5:01 AM      Result Value Ref Range   Troponin i, poc 0.01  0.00 - 0.08 ng/mL   Comment 3               Imaging Review Dg Chest 2 View  09/16/2013   CLINICAL DATA:  Left-sided chest pain.  EXAM: CHEST  2 VIEW  COMPARISON:  DG CHEST 2 VIEW dated 05/13/2013; DG CHEST 2 VIEW dated 03/22/2013  FINDINGS: The heart size and  mediastinal  contours are within normal limits. Both lungs are clear. The visualized skeletal structures are unremarkable. Multiple EKG lines overlie the patient and may obscure subtle underlying pathology. Large body habitus.  IMPRESSION: No active cardiopulmonary disease.   Electronically Signed   By: Elon Alas   On: 09/16/2013 05:39   EKG: nsr, no acute ischemic changes, normal intervals, normal axis, normal qrs complex  MDM   DDX: ACS, pneumothorax, pneumonia, pericardial or pleural effusion, gastritis, GERD/PUD, musculoskeletal pain.   0750: Chest pain resolved with GI cocktail. Now patient is having epigastric pain with radiation to the RUQ. RUQ U/S pending. Patient signed out to Dr. Venita Sheffield at change of shift.   Elyn Peers, MD 09/16/13 [phone]

## 2013-09-16 NOTE — ED Notes (Signed)
Called lab about add on for hepatitis and lipase.

## 2013-09-16 NOTE — ED Notes (Signed)
Pt refers she is no longer nauseous, but still having abd pain 5/10 on her Right side.

## 2013-09-16 NOTE — ED Notes (Signed)
Shanon Brow, nurse tech at bedside to attempt blood draw.

## 2013-11-23 ENCOUNTER — Encounter (HOSPITAL_COMMUNITY): Payer: Self-pay | Admitting: Emergency Medicine

## 2013-11-23 DIAGNOSIS — E039 Hypothyroidism, unspecified: Secondary | ICD-10-CM | POA: Diagnosis not present

## 2013-11-23 DIAGNOSIS — E669 Obesity, unspecified: Secondary | ICD-10-CM | POA: Insufficient documentation

## 2013-11-23 DIAGNOSIS — IMO0001 Reserved for inherently not codable concepts without codable children: Secondary | ICD-10-CM | POA: Insufficient documentation

## 2013-11-23 DIAGNOSIS — R1032 Left lower quadrant pain: Secondary | ICD-10-CM | POA: Diagnosis not present

## 2013-11-23 DIAGNOSIS — F17200 Nicotine dependence, unspecified, uncomplicated: Secondary | ICD-10-CM | POA: Diagnosis not present

## 2013-11-23 DIAGNOSIS — I499 Cardiac arrhythmia, unspecified: Secondary | ICD-10-CM | POA: Insufficient documentation

## 2013-11-23 DIAGNOSIS — Z9911 Dependence on respirator [ventilator] status: Secondary | ICD-10-CM | POA: Insufficient documentation

## 2013-11-23 DIAGNOSIS — Z7982 Long term (current) use of aspirin: Secondary | ICD-10-CM | POA: Diagnosis not present

## 2013-11-23 DIAGNOSIS — R109 Unspecified abdominal pain: Secondary | ICD-10-CM | POA: Diagnosis present

## 2013-11-23 DIAGNOSIS — J069 Acute upper respiratory infection, unspecified: Secondary | ICD-10-CM | POA: Diagnosis not present

## 2013-11-23 DIAGNOSIS — J45909 Unspecified asthma, uncomplicated: Secondary | ICD-10-CM | POA: Insufficient documentation

## 2013-11-23 DIAGNOSIS — Z79899 Other long term (current) drug therapy: Secondary | ICD-10-CM | POA: Diagnosis not present

## 2013-11-23 DIAGNOSIS — Z8719 Personal history of other diseases of the digestive system: Secondary | ICD-10-CM | POA: Diagnosis not present

## 2013-11-23 DIAGNOSIS — F411 Generalized anxiety disorder: Secondary | ICD-10-CM | POA: Insufficient documentation

## 2013-11-23 LAB — CBC WITH DIFFERENTIAL/PLATELET
Basophils Absolute: 0 10*3/uL (ref 0.0–0.1)
Basophils Relative: 0 % (ref 0–1)
EOS ABS: 0.3 10*3/uL (ref 0.0–0.7)
Eosinophils Relative: 3 % (ref 0–5)
HEMATOCRIT: 41.3 % (ref 36.0–46.0)
HEMOGLOBIN: 13.4 g/dL (ref 12.0–15.0)
LYMPHS ABS: 2 10*3/uL (ref 0.7–4.0)
Lymphocytes Relative: 20 % (ref 12–46)
MCH: 27 pg (ref 26.0–34.0)
MCHC: 32.4 g/dL (ref 30.0–36.0)
MCV: 83.3 fL (ref 78.0–100.0)
MONO ABS: 0.7 10*3/uL (ref 0.1–1.0)
Monocytes Relative: 7 % (ref 3–12)
NEUTROS PCT: 70 % (ref 43–77)
Neutro Abs: 7.4 10*3/uL (ref 1.7–7.7)
Platelets: 396 10*3/uL (ref 150–400)
RBC: 4.96 MIL/uL (ref 3.87–5.11)
RDW: 14.9 % (ref 11.5–15.5)
WBC: 10.4 10*3/uL (ref 4.0–10.5)

## 2013-11-23 LAB — COMPREHENSIVE METABOLIC PANEL WITH GFR
ALT: 15 U/L (ref 0–35)
AST: 14 U/L (ref 0–37)
Albumin: 3.4 g/dL — ABNORMAL LOW (ref 3.5–5.2)
Alkaline Phosphatase: 94 U/L (ref 39–117)
Anion gap: 12 (ref 5–15)
BUN: 10 mg/dL (ref 6–23)
CO2: 24 meq/L (ref 19–32)
Calcium: 9.2 mg/dL (ref 8.4–10.5)
Chloride: 103 meq/L (ref 96–112)
Creatinine, Ser: 0.67 mg/dL (ref 0.50–1.10)
GFR calc Af Amer: >90 mL/min (ref >90)
GFR calc non Af Amer: >90 mL/min (ref >90)
Glucose, Bld: 115 mg/dL — ABNORMAL HIGH (ref 70–99)
Potassium: 4.2 meq/L (ref 3.7–5.3)
Sodium: 139 meq/L (ref 137–147)
Total Bilirubin: <0.2 mg/dL — ABNORMAL LOW (ref 0.3–1.2)
Total Protein: 7.8 g/dL (ref 6.0–8.3)

## 2013-11-23 MED ORDER — OXYCODONE-ACETAMINOPHEN 5-325 MG PO TABS
2.0000 | ORAL_TABLET | Freq: Once | ORAL | Status: AC
  Administered 2013-11-23: 2 via ORAL
  Filled 2013-11-23: qty 2

## 2013-11-23 NOTE — ED Notes (Signed)
Patient presents with c/o left lower abd pain like someone is stabbing her in the abd.  Denies urinary symptoms

## 2013-11-24 ENCOUNTER — Emergency Department (HOSPITAL_COMMUNITY): Payer: Medicaid Other

## 2013-11-24 ENCOUNTER — Emergency Department (HOSPITAL_COMMUNITY)
Admission: EM | Admit: 2013-11-24 | Discharge: 2013-11-24 | Disposition: A | Discharge status: Home / Self Care | Payer: Medicaid Other | Attending: Emergency Medicine | Admitting: Emergency Medicine

## 2013-11-24 DIAGNOSIS — R1032 Left lower quadrant pain: Secondary | ICD-10-CM

## 2013-11-24 LAB — URINALYSIS, ROUTINE W REFLEX MICROSCOPIC
BILIRUBIN URINE: NEGATIVE
Glucose, UA: NEGATIVE mg/dL
Ketones, ur: NEGATIVE mg/dL
Nitrite: NEGATIVE
PROTEIN: NEGATIVE mg/dL
Specific Gravity, Urine: 1.024 (ref 1.005–1.030)
UROBILINOGEN UA: 0.2 mg/dL (ref 0.0–1.0)
pH: 6 (ref 5.0–8.0)

## 2013-11-24 LAB — URINE MICROSCOPIC-ADD ON

## 2013-11-24 LAB — PREGNANCY, URINE: Preg Test, Ur: NEGATIVE

## 2013-11-24 MED ORDER — OXYCODONE-ACETAMINOPHEN 5-325 MG PO TABS
1.0000 | ORAL_TABLET | Freq: Once | ORAL | Status: AC
  Administered 2013-11-24: 1 via ORAL
  Filled 2013-11-24: qty 1

## 2013-11-24 MED ORDER — OXYCODONE-ACETAMINOPHEN 5-325 MG PO TABS
1.0000 | ORAL_TABLET | Freq: Once | ORAL | Status: AC
Start: 2013-11-24 — End: 2013-11-24
  Administered 2013-11-24: 1 via ORAL
  Filled 2013-11-24: qty 1

## 2013-11-24 MED ORDER — ONDANSETRON 4 MG PO TBDP
8.0000 mg | ORAL_TABLET | Freq: Once | ORAL | Status: AC
  Administered 2013-11-24: 8 mg via ORAL
  Filled 2013-11-24: qty 2

## 2013-11-24 MED ORDER — ONDANSETRON 4 MG PO TBDP
4.0000 mg | ORAL_TABLET | Freq: Once | ORAL | Status: AC
  Administered 2013-11-24: 4 mg via ORAL
  Filled 2013-11-24: qty 1

## 2013-11-24 MED ORDER — PROMETHAZINE HCL 25 MG PO TABS: 25.0000 mg | ORAL_TABLET | Freq: Four times a day (QID) | ORAL | Status: DC | PRN

## 2013-11-24 MED ORDER — HYDROCODONE-ACETAMINOPHEN 5-325 MG PO TABS: 1.0000 | ORAL_TABLET | Freq: Four times a day (QID) | ORAL | Status: DC | PRN

## 2013-11-24 MED ORDER — IBUPROFEN 600 MG PO TABS: 600.0000 mg | ORAL_TABLET | Freq: Four times a day (QID) | ORAL | Status: DC | PRN

## 2013-11-24 NOTE — ED Notes (Signed)
Pt states that she had a bowel movement around 1730 yesterday evening, and afterwards started having stabbing pains in her left lower abdomen.

## 2013-11-24 NOTE — ED Provider Notes (Signed)
CSN: [id number]     Arrival date & time 11/23/13  2044 History   First MD Initiated Contact with Patient 11/24/13 [phone]     Chief Complaint  Patient presents with  . Abdominal Pain     (Consider location/radiation/quality/duration/timing/severity/associated sxs/prior Treatment) HPI Comments: 38 year old female presents to the ED with complaints of LLQ abdominal pain.  The pain began abruptly after defecating at 5:30pm yesterday evening.  She describes the pain as sharp and constant and locates it to her left inguinal fold.  Denies any radiation of pain.  It has decreased in intensity since taking two Percocet upon arrival to the ED.  Currently 5/10 intensity.  Patient has not experienced pain like this before.   Pt complains of a headache and nausea that has begun since the onset of this abdominal pain.  She complains frequency of urination for the past three days.  Denies any dysuria or gross hematuria.  Pt is a P41G1 female. Her LMP was 11/19/2013.  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  Her left fallopian tube has been surgically closed.  No other abdominal surgeries.  Not currently sexually active.   Patient is a 38 y.o. female presenting with abdominal pain. The history is provided by the patient.  Abdominal Pain Associated symptoms: nausea   Associated symptoms: no chest pain, no dysuria, no shortness of breath and no vomiting     Past Medical History  Diagnosis Date  . Asthma   . Migraine   . Obesity   . Thyroid disease   . Fibromyalgia   . Anxiety   . GERD (gastroesophageal reflux disease)   . Fracture of right foot   . Anxiety   . Complication of anesthesia     "STATES WAKES UP AND CANT BREATHE AND NEEDS BREATHING TREATMENT  IN PACU"  . Dysrhythmia     palpitations- "from thyroid"  . Sleep apnea      NO CPAP--could not tolerates mask  . Viral respiratory infection 06/26/13    ? influenza   Past Surgical  History  Procedure Laterality Date  . Tubal ligation    . Nasal septum surgery    . Wisdom tooth extraction    . Thyroid lobectomy Right 07/19/2013    Procedure: THYROID LOBECTOMY;  Surgeon: Harl Bowie, MD;  Location: [facility];  Service: General;  Laterality: Right;   Family History  Problem Relation Age of Onset  . Hypertension Mother   . Hypertension Father   . Diabetes Sister   . Cancer Paternal Grandmother     breast   History  Substance Use Topics  . Smoking status: Light Tobacco Smoker -- 0.25 packs/day for 20 years    Types: Cigarettes  . Smokeless tobacco: Never Used  . Alcohol Use: No     Comment: socially   OB History   Grav Para Term Preterm Abortions TAB SAB Ect Mult Living                 Review of Systems  Constitutional: Positive for activity change.  Respiratory: Negative for shortness of breath.   Cardiovascular: Negative for chest pain.  Gastrointestinal: Positive for nausea and abdominal pain. Negative for vomiting.  Genitourinary: Negative for dysuria.  Musculoskeletal: Negative for neck pain.  Neurological: Negative for headaches.      Allergies  Beta adrenergic blockers  Home Medications   Prior to Admission medications   Medication Sig Start Date  End Date Taking? Authorizing Provider  acetaminophen (TYLENOL) 500 MG tablet Take 500 mg by mouth every 6 (six) hours as needed.   Yes Historical Provider, MD  albuterol (PROVENTIL HFA;VENTOLIN HFA) 108 (90 BASE) MCG/ACT inhaler Inhale 1 puff into the lungs every 6 (six) hours as needed for wheezing or shortness of breath.   Yes Historical Provider, MD  Aspirin-Salicylamide-Caffeine (BC HEADACHE POWDER PO) Take 1 Package by mouth daily as needed (pain).   Yes Historical Provider, MD  levothyroxine (SYNTHROID, LEVOTHROID) 50 MCG tablet Take 50 mcg by mouth daily before breakfast.   Yes Historical Provider, MD  LORazepam (ATIVAN) 1 MG tablet Take 1 mg by mouth every 8 (eight) hours as needed for  anxiety.   Yes Historical Provider, MD  mometasone-formoterol (DULERA) 200-5 MCG/ACT AERO Inhale 2 puffs into the lungs 2 (two) times daily.   Yes Historical Provider, MD  HYDROcodone-acetaminophen (NORCO/VICODIN) 5-325 MG per tablet Take 1 tablet by mouth every 6 (six) hours as needed. 11/24/13   Varney Biles, MD  ibuprofen (ADVIL,MOTRIN) 600 MG tablet Take 1 tablet (600 mg total) by mouth every 6 (six) hours as needed. 11/24/13   Varney Biles, MD  promethazine (PHENERGAN) 25 MG tablet Take 1 tablet (25 mg total) by mouth every 6 (six) hours as needed for nausea. 11/24/13   Roosevelt Eimers Kathrynn Humble, MD   BP 124/73  Pulse 68  Temp(Src) 98.2 F (36.8 C) (Oral)  Resp 18  Ht 5\' 4"  (1.626 m)  Wt 350 lb (158.759 kg)  BMI 60.05 kg/m2  SpO2 96%  LMP 11/17/2013 Physical Exam  Nursing note and vitals reviewed. Constitutional: She is oriented to person, place, and time. She appears well-developed and well-nourished.  HENT:  Head: Normocephalic and atraumatic.  Eyes: EOM are normal. Pupils are equal, round, and reactive to light.  Neck: Neck supple.  Cardiovascular: Normal rate, regular rhythm and normal heart sounds.   No murmur heard. Pulmonary/Chest: Effort normal. No respiratory distress.  Abdominal: Soft. She exhibits no distension. There is tenderness. There is no rebound and no guarding.  Tenderness over LLQ - mild.  Neurological: She is alert and oriented to person, place, and time.  Skin: Skin is warm and dry.    ED Course  Procedures (including critical care time) Labs Review Labs Reviewed  COMPREHENSIVE METABOLIC PANEL - Abnormal; Notable for the following:    Glucose, Bld 115 (*)    Albumin 3.4 (*)    Total Bilirubin <0.2 (*)    All other components within normal limits  URINALYSIS, ROUTINE W REFLEX MICROSCOPIC - Abnormal; Notable for the following:    APPearance CLOUDY (*)    Hgb urine dipstick MODERATE (*)    Leukocytes, UA SMALL (*)    All other components within normal limits   URINE MICROSCOPIC-ADD ON - Abnormal; Notable for the following:    Squamous Epithelial / LPF FEW (*)    All other components within normal limits  URINE CULTURE  CBC WITH DIFFERENTIAL  PREGNANCY, URINE    Imaging Review US Transvaginal Non-ob  11/24/2013   CLINICAL DATA:  Left lower quadrant abdominal pain.  EXAM: TRANSABDOMINAL AND TRANSVAGINAL ULTRASOUND OF PELVIS  TECHNIQUE: Both transabdominal and transvaginal ultrasound examinations of the pelvis were performed. Transabdominal technique was performed for global imaging of the pelvis including uterus, ovaries, adnexal regions, and pelvic cul-de-sac. It was necessary to proceed with endovaginal exam following the transabdominal exam to visualize the ovaries in greater detail.  COMPARISON:  CT of the abdomen and pelvis  performed 01/01/2010, and pelvic ultrasound performed 10/11/2009  FINDINGS: Uterus  Measurements: 9.9 x 3.5 x 4.1 cm. No fibroids or other mass visualized.  Endometrium  Thickness: 0.4 cm.  No focal abnormality visualized.  Right ovary  Measurements: 2.2 x 1.7 x 1.6 cm. Normal appearance/no adnexal mass.  Left ovary  Not visualized due to overlying bowel gas.  Other findings  No free fluid is seen within the pelvic cul-de-sac.  IMPRESSION: Unremarkable pelvic ultrasound. The left ovary is not visualized due to overlying bowel gas. No ancillary findings seen to suggest ovarian torsion.   Electronically Signed   By: Garald Balding M.D.   On: 11/24/2013 04:52   US Pelvis Complete  11/24/2013   CLINICAL DATA:  Left lower quadrant abdominal pain.  EXAM: TRANSABDOMINAL AND TRANSVAGINAL ULTRASOUND OF PELVIS  TECHNIQUE: Both transabdominal and transvaginal ultrasound examinations of the pelvis were performed. Transabdominal technique was performed for global imaging of the pelvis including uterus, ovaries, adnexal regions, and pelvic cul-de-sac. It was necessary to proceed with endovaginal exam following the transabdominal exam to visualize  the ovaries in greater detail.  COMPARISON:  CT of the abdomen and pelvis performed 01/01/2010, and pelvic ultrasound performed 10/11/2009  FINDINGS: Uterus  Measurements: 9.9 x 3.5 x 4.1 cm. No fibroids or other mass visualized.  Endometrium  Thickness: 0.4 cm.  No focal abnormality visualized.  Right ovary  Measurements: 2.2 x 1.7 x 1.6 cm. Normal appearance/no adnexal mass.  Left ovary  Not visualized due to overlying bowel gas.  Other findings  No free fluid is seen within the pelvic cul-de-sac.  IMPRESSION: Unremarkable pelvic ultrasound. The left ovary is not visualized due to overlying bowel gas. No ancillary findings seen to suggest ovarian torsion.   Electronically Signed   By: Garald Balding M.D.   On: 11/24/2013 04:52   Dg Abd 2 Views  11/24/2013   CLINICAL DATA:  Sudden onset of stabbing left lower quadrant abdominal pain and nausea.  EXAM: ABDOMEN - 2 VIEW  COMPARISON:  Abdominal radiograph performed 01/01/2010  FINDINGS: The visualized bowel gas pattern is unremarkable. Scattered air and stool filled loops of colon are seen; no abnormal dilatation of small bowel loops is seen to suggest small bowel obstruction. No free intra-abdominal air is identified on the provided upright view.  The visualized osseous structures are within normal limits; the sacroiliac joints are unremarkable in appearance. The visualized portions of the lungs are essentially clear.  IMPRESSION: Unremarkable bowel gas pattern; no free intra-abdominal air seen. Small amount of stool noted in the colon.   Electronically Signed   By: Garald Balding M.D.   On: 11/24/2013 02:07     EKG Interpretation None      MDM   Final diagnoses:  Left lower quadrant pain    Pt with abd pain. LLW mainly - near the inguinal region. Intermittent - so thinking less likely to be diverticulitis. She has hx of renal stone - but based on exam and lab - if this pain is from stone - it will likely pass as the otherone did. Return precautions  have been discussed.  US pelvis is neg. Pt tolerated pain with oral meds.  Will d.c. PCP f/u advised.    Varney Biles, MD 11/24/13 [phone]

## 2013-11-24 NOTE — Discharge Instructions (Signed)
Abdominal Pain, Women °Abdominal (stomach, pelvic, or belly) pain can be caused by many things. It is important to tell your doctor: °· The location of the pain. °· Does it come and go or is it present all the time? °· Are there things that start the pain (eating certain foods, exercise)? °· Are there other symptoms associated with the pain (fever, nausea, vomiting, diarrhea)? °All of this is helpful to know when trying to find the cause of the pain. °CAUSES  °· Stomach: virus or bacteria infection, or ulcer. °· Intestine: appendicitis (inflamed appendix), regional ileitis (Crohn's disease), ulcerative colitis (inflamed colon), irritable bowel syndrome, diverticulitis (inflamed diverticulum of the colon), or cancer of the stomach or intestine. °· Gallbladder disease or stones in the gallbladder. °· Kidney disease, kidney stones, or infection. °· Pancreas infection or cancer. °· Fibromyalgia (pain disorder). °· Diseases of the female organs: °¨ Uterus: fibroid (non-cancerous) tumors or infection. °¨ Fallopian tubes: infection or tubal pregnancy. °¨ Ovary: cysts or tumors. °¨ Pelvic adhesions (scar tissue). °¨ Endometriosis (uterus lining tissue growing in the pelvis and on the pelvic organs). °¨ Pelvic congestion syndrome (female organs filling up with blood just before the menstrual period). °¨ Pain with the menstrual period. °¨ Pain with ovulation (producing an egg). °¨ Pain with an IUD (intrauterine device, birth control) in the uterus. °¨ Cancer of the female organs. °· Functional pain (pain not caused by a disease, may improve without treatment). °· Psychological pain. °· Depression. °DIAGNOSIS  °Your doctor will decide the seriousness of your pain by doing an examination. °· Blood tests. °· X-rays. °· Ultrasound. °· CT scan (computed tomography, special type of X-ray). °· MRI (magnetic resonance imaging). °· Cultures, for infection. °· Barium enema (dye inserted in the large intestine, to better view it with  X-rays). °· Colonoscopy (looking in intestine with a lighted tube). °· Laparoscopy (minor surgery, looking in abdomen with a lighted tube). °· Major abdominal exploratory surgery (looking in abdomen with a large incision). °TREATMENT  °The treatment will depend on the cause of the pain.  °· Many cases can be observed and treated at home. °· Over-the-counter medicines recommended by your caregiver. °· Prescription medicine. °· Antibiotics, for infection. °· Birth control pills, for painful periods or for ovulation pain. °· Hormone treatment, for endometriosis. °· Nerve blocking injections. °· Physical therapy. °· Antidepressants. °· Counseling with a psychologist or psychiatrist. °· Minor or major surgery. °HOME CARE INSTRUCTIONS  °· Do not take laxatives, unless directed by your caregiver. °· Take over-the-counter pain medicine only if ordered by your caregiver. Do not take aspirin because it can cause an upset stomach or bleeding. °· Try a clear liquid diet (broth or water) as ordered by your caregiver. Slowly move to a bland diet, as tolerated, if the pain is related to the stomach or intestine. °· Have a thermometer and take your temperature several times a day, and record it. °· Bed rest and sleep, if it helps the pain. °· Avoid sexual intercourse, if it causes pain. °· Avoid stressful situations. °· Keep your follow-up appointments and tests, as your caregiver orders. °· If the pain does not go away with medicine or surgery, you may try: °¨ Acupuncture. °¨ Relaxation exercises (yoga, meditation). °¨ Group therapy. °¨ Counseling. °SEEK MEDICAL CARE IF:  °· You notice certain foods cause stomach pain. °· Your home care treatment is not helping your pain. °· You need stronger pain medicine. °· You want your IUD removed. °· You feel faint or   lightheaded. °· You develop nausea and vomiting. °· You develop a rash. °· You are having side effects or an allergy to your medicine. °SEEK IMMEDIATE MEDICAL CARE IF:  °· Your  pain does not go away or gets worse. °· You have a fever. °· Your pain is felt only in portions of the abdomen. The right side could possibly be appendicitis. The left lower portion of the abdomen could be colitis or diverticulitis. °· You are passing blood in your stools (bright red or black tarry stools, with or without vomiting). °· You have blood in your urine. °· You develop chills, with or without a fever. °· You pass out. °MAKE SURE YOU:  °· Understand these instructions. °· Will watch your condition. °· Will get help right away if you are not doing well or get worse. °Document Released: 02/14/2007 Document Revised: 09/03/2013 Document Reviewed: 03/06/2009 °[employer]® Patient Information ©2015 [employer]. This information is not intended to replace advice given to you by your health care provider. Make sure you discuss any questions you have with your health care provider. ° °

## 2013-11-25 LAB — URINE CULTURE

## 2013-12-02 ENCOUNTER — Encounter (HOSPITAL_COMMUNITY): Payer: Self-pay | Admitting: Emergency Medicine

## 2013-12-02 DIAGNOSIS — Z87442 Personal history of urinary calculi: Secondary | ICD-10-CM | POA: Insufficient documentation

## 2013-12-02 DIAGNOSIS — Z8679 Personal history of other diseases of the circulatory system: Secondary | ICD-10-CM | POA: Diagnosis not present

## 2013-12-02 DIAGNOSIS — Z8719 Personal history of other diseases of the digestive system: Secondary | ICD-10-CM | POA: Insufficient documentation

## 2013-12-02 DIAGNOSIS — J45909 Unspecified asthma, uncomplicated: Secondary | ICD-10-CM | POA: Diagnosis not present

## 2013-12-02 DIAGNOSIS — F329 Major depressive disorder, single episode, unspecified: Secondary | ICD-10-CM | POA: Diagnosis not present

## 2013-12-02 DIAGNOSIS — IMO0002 Reserved for concepts with insufficient information to code with codable children: Secondary | ICD-10-CM | POA: Insufficient documentation

## 2013-12-02 DIAGNOSIS — Z79899 Other long term (current) drug therapy: Secondary | ICD-10-CM | POA: Insufficient documentation

## 2013-12-02 DIAGNOSIS — E079 Disorder of thyroid, unspecified: Secondary | ICD-10-CM | POA: Diagnosis not present

## 2013-12-02 DIAGNOSIS — F17200 Nicotine dependence, unspecified, uncomplicated: Secondary | ICD-10-CM | POA: Insufficient documentation

## 2013-12-02 DIAGNOSIS — N390 Urinary tract infection, site not specified: Secondary | ICD-10-CM | POA: Diagnosis not present

## 2013-12-02 DIAGNOSIS — F3289 Other specified depressive episodes: Secondary | ICD-10-CM | POA: Insufficient documentation

## 2013-12-02 DIAGNOSIS — F411 Generalized anxiety disorder: Secondary | ICD-10-CM | POA: Insufficient documentation

## 2013-12-02 DIAGNOSIS — G43909 Migraine, unspecified, not intractable, without status migrainosus: Secondary | ICD-10-CM | POA: Diagnosis not present

## 2013-12-02 DIAGNOSIS — E669 Obesity, unspecified: Secondary | ICD-10-CM | POA: Diagnosis not present

## 2013-12-02 DIAGNOSIS — Z8781 Personal history of (healed) traumatic fracture: Secondary | ICD-10-CM | POA: Insufficient documentation

## 2013-12-02 DIAGNOSIS — Z8619 Personal history of other infectious and parasitic diseases: Secondary | ICD-10-CM | POA: Diagnosis not present

## 2013-12-02 DIAGNOSIS — M5380 Other specified dorsopathies, site unspecified: Secondary | ICD-10-CM | POA: Diagnosis not present

## 2013-12-02 DIAGNOSIS — R109 Unspecified abdominal pain: Secondary | ICD-10-CM | POA: Diagnosis present

## 2013-12-02 LAB — URINE MICROSCOPIC-ADD ON

## 2013-12-02 LAB — URINALYSIS, ROUTINE W REFLEX MICROSCOPIC
Glucose, UA: NEGATIVE mg/dL
HGB URINE DIPSTICK: NEGATIVE
Ketones, ur: 15 mg/dL — AB
Nitrite: NEGATIVE
PROTEIN: 30 mg/dL — AB
Specific Gravity, Urine: 1.039 — ABNORMAL HIGH (ref 1.005–1.030)
UROBILINOGEN UA: 0.2 mg/dL (ref 0.0–1.0)
pH: 6 (ref 5.0–8.0)

## 2013-12-02 MED ORDER — IBUPROFEN 200 MG PO TABS
400.0000 mg | ORAL_TABLET | Freq: Once | ORAL | Status: AC
  Administered 2013-12-02: 400 mg via ORAL
  Filled 2013-12-02: qty 2

## 2013-12-02 MED ORDER — OXYCODONE-ACETAMINOPHEN 5-325 MG PO TABS
1.0000 | ORAL_TABLET | Freq: Once | ORAL | Status: AC
  Administered 2013-12-02: 1 via ORAL
  Filled 2013-12-02: qty 1

## 2013-12-02 MED ORDER — ONDANSETRON 4 MG PO TBDP
8.0000 mg | ORAL_TABLET | Freq: Once | ORAL | Status: AC
  Administered 2013-12-02: 8 mg via ORAL
  Filled 2013-12-02: qty 2

## 2013-12-02 NOTE — ED Notes (Signed)
C/o bilateral flank pain, onset 9d ago, seen recently for the same, reports recent CT scan showing R & L kidney stones, CTs done at [place] in [place], seen by PA in [facility] system in [place]. Taking vicodin & ibuprofen w/o relief. Last vidodin at 1300, last ibuprofen at 1600. Also reports urine decreased with strong odor, "feels like UTI". Also nausea & dizziness.

## 2013-12-02 NOTE — ED Notes (Signed)
Triage EMTs attempted to collect blood 2x with no success. Phlebotomy notified

## 2013-12-03 ENCOUNTER — Emergency Department (HOSPITAL_COMMUNITY)
Admission: EM | Admit: 2013-12-03 | Discharge: 2013-12-03 | Disposition: A | Discharge status: Home / Self Care | Payer: Medicaid Other | Attending: Emergency Medicine | Admitting: Emergency Medicine

## 2013-12-03 ENCOUNTER — Encounter (HOSPITAL_COMMUNITY): Payer: Self-pay | Admitting: Emergency Medicine

## 2013-12-03 DIAGNOSIS — N390 Urinary tract infection, site not specified: Secondary | ICD-10-CM

## 2013-12-03 DIAGNOSIS — M62830 Muscle spasm of back: Secondary | ICD-10-CM

## 2013-12-03 LAB — CBC WITH DIFFERENTIAL/PLATELET
Basophils Absolute: 0.1 10*3/uL (ref 0.0–0.1)
Basophils Relative: 0 % (ref 0–1)
EOS ABS: 0.5 10*3/uL (ref 0.0–0.7)
Eosinophils Relative: 4 % (ref 0–5)
HCT: 39.4 % (ref 36.0–46.0)
Hemoglobin: 12.7 g/dL (ref 12.0–15.0)
LYMPHS ABS: 2.7 10*3/uL (ref 0.7–4.0)
Lymphocytes Relative: 24 % (ref 12–46)
MCH: 26.6 pg (ref 26.0–34.0)
MCHC: 32.2 g/dL (ref 30.0–36.0)
MCV: 82.6 fL (ref 78.0–100.0)
MONO ABS: 0.7 10*3/uL (ref 0.1–1.0)
Monocytes Relative: 6 % (ref 3–12)
Neutro Abs: 7.7 10*3/uL (ref 1.7–7.7)
Neutrophils Relative %: 66 % (ref 43–77)
PLATELETS: 397 10*3/uL (ref 150–400)
RBC: 4.77 MIL/uL (ref 3.87–5.11)
RDW: 15.1 % (ref 11.5–15.5)
WBC: 11.7 10*3/uL — ABNORMAL HIGH (ref 4.0–10.5)

## 2013-12-03 LAB — BASIC METABOLIC PANEL
Anion gap: 16 — ABNORMAL HIGH (ref 5–15)
BUN: 12 mg/dL (ref 6–23)
CALCIUM: 9.1 mg/dL (ref 8.4–10.5)
CO2: 20 mEq/L (ref 19–32)
Chloride: 99 mEq/L (ref 96–112)
Creatinine, Ser: 0.61 mg/dL (ref 0.50–1.10)
GFR calc Af Amer: >90 mL/min (ref >90)
GLUCOSE: 98 mg/dL (ref 70–99)
POTASSIUM: 4.4 meq/L (ref 3.7–5.3)
Sodium: 135 mEq/L — ABNORMAL LOW (ref 137–147)

## 2013-12-03 MED ORDER — NITROFURANTOIN MONOHYD MACRO 100 MG PO CAPS: 100.0000 mg | ORAL_CAPSULE | Freq: Two times a day (BID) | ORAL | Status: DC

## 2013-12-03 MED ORDER — DICYCLOMINE HCL 10 MG/ML IM SOLN
20.0000 mg | Freq: Once | INTRAMUSCULAR | Status: AC
  Administered 2013-12-03: 20 mg via INTRAMUSCULAR
  Filled 2013-12-03: qty 2

## 2013-12-03 MED ORDER — KETOROLAC TROMETHAMINE 60 MG/2ML IM SOLN
60.0000 mg | Freq: Once | INTRAMUSCULAR | Status: AC
  Administered 2013-12-03: 60 mg via INTRAMUSCULAR
  Filled 2013-12-03: qty 2

## 2013-12-03 MED ORDER — NITROFURANTOIN MONOHYD MACRO 100 MG PO CAPS
100.0000 mg | ORAL_CAPSULE | Freq: Once | ORAL | Status: AC
  Administered 2013-12-03: 100 mg via ORAL
  Filled 2013-12-03: qty 1

## 2013-12-03 MED ORDER — METHOCARBAMOL 500 MG PO TABS
1000.0000 mg | ORAL_TABLET | Freq: Once | ORAL | Status: AC
  Administered 2013-12-03: 1000 mg via ORAL
  Filled 2013-12-03: qty 2

## 2013-12-03 MED ORDER — METHOCARBAMOL 1000 MG/10ML IJ SOLN
1000.0000 mg | Freq: Once | INTRAMUSCULAR | Status: DC
  Filled 2013-12-03: qty 10

## 2013-12-03 MED ORDER — PHENAZOPYRIDINE HCL 200 MG PO TABS
200.0000 mg | ORAL_TABLET | Freq: Three times a day (TID) | ORAL | Status: DC
Start: 2013-12-03 — End: 2014-10-03

## 2013-12-03 MED ORDER — DICYCLOMINE HCL 10 MG PO CAPS: 10.0000 mg | ORAL_CAPSULE | Freq: Once | ORAL | Status: DC

## 2013-12-03 MED ORDER — HYDROMORPHONE HCL 2 MG PO TABS
2.0000 mg | ORAL_TABLET | Freq: Once | ORAL | Status: AC
  Administered 2013-12-03: 2 mg via ORAL
  Filled 2013-12-03: qty 1

## 2013-12-03 MED ORDER — OXYCODONE-ACETAMINOPHEN 5-325 MG PO TABS: 1.0000 | ORAL_TABLET | Freq: Four times a day (QID) | ORAL | Status: DC | PRN

## 2013-12-03 MED ORDER — MELOXICAM 7.5 MG PO TABS: 7.5000 mg | ORAL_TABLET | Freq: Every day | ORAL | Status: DC

## 2013-12-03 MED ORDER — PHENAZOPYRIDINE HCL 100 MG PO TABS
200.0000 mg | ORAL_TABLET | Freq: Once | ORAL | Status: AC
  Administered 2013-12-03: 200 mg via ORAL
  Filled 2013-12-03: qty 2

## 2013-12-03 MED ORDER — METHOCARBAMOL 500 MG PO TABS: 500.0000 mg | ORAL_TABLET | Freq: Two times a day (BID) | ORAL | Status: DC

## 2013-12-03 NOTE — ED Notes (Signed)
PT placed in gown and in bed. PT monitored by pulse ox and bp cuff. No 5-lead, per nurse.

## 2013-12-03 NOTE — ED Notes (Signed)
Patient is tearful at this time. States pain medication in waiting room has not helped her pain. Asking for something to help her relax.

## 2013-12-03 NOTE — ED Notes (Signed)
Patient discharged with all personal belongings. 

## 2013-12-03 NOTE — ED Notes (Signed)
Attempted to collect blood x's 2 without success.

## 2013-12-03 NOTE — ED Provider Notes (Signed)
CSN: [id number]     Arrival date & time 12/02/13  2105 History   First MD Initiated Contact with Patient 12/03/13 0308     Chief Complaint  Patient presents with  . Flank Pain     (Consider location/radiation/quality/duration/timing/severity/associated sxs/prior Treatment) Patient is a 38 y.o. female presenting with flank pain. The history is provided by the patient.  Flank Pain This is a new problem. The current episode started more than 1 week ago. The problem occurs constantly. The problem has not changed since onset.Pertinent negatives include no chest pain, no headaches and no shortness of breath. Nothing aggravates the symptoms. Nothing relieves the symptoms. Treatments tried: norco and pain medication. The treatment provided no relief.  Had peliv US done here and then saw someone in PMD office who ordered CT.  Stones in the kidney but no hydro none in the ureter and is continuing to have intractable pain.  Has dysuria  Past Medical History  Diagnosis Date  . Asthma   . Migraine   . Obesity   . Thyroid disease   . Fibromyalgia   . Anxiety   . GERD (gastroesophageal reflux disease)   . Fracture of right foot   . Anxiety   . Complication of anesthesia     "STATES WAKES UP AND CANT BREATHE AND NEEDS BREATHING TREATMENT  IN PACU"  . Dysrhythmia     palpitations- "from thyroid"  . Sleep apnea      NO CPAP--could not tolerates mask  . Viral respiratory infection 06/26/13    ? influenza   Past Surgical History  Procedure Laterality Date  . Tubal ligation    . Nasal septum surgery    . Wisdom tooth extraction    . Thyroid lobectomy Right 07/19/2013    Procedure: THYROID LOBECTOMY;  Surgeon: Harl Bowie, MD;  Location: [facility];  Service: General;  Laterality: Right;   Family History  Problem Relation Age of Onset  . Hypertension Mother   . Hypertension Father   . Diabetes Sister   . Cancer Paternal Grandmother     breast   History  Substance Use Topics  . Smoking  status: Light Tobacco Smoker -- 0.25 packs/day for 20 years    Types: Cigarettes  . Smokeless tobacco: Never Used  . Alcohol Use: No     Comment: socially   OB History   Grav Para Term Preterm Abortions TAB SAB Ect Mult Living                 Review of Systems  Respiratory: Negative for shortness of breath.   Cardiovascular: Negative for chest pain.  Gastrointestinal: Negative for vomiting and diarrhea.  Genitourinary: Positive for dysuria and flank pain.  Neurological: Negative for headaches.  All other systems reviewed and are negative.     Allergies  Beta adrenergic blockers  Home Medications   Prior to Admission medications   Medication Sig Start Date End Date Taking? Authorizing Provider  albuterol (PROVENTIL HFA;VENTOLIN HFA) 108 (90 BASE) MCG/ACT inhaler Inhale 1 puff into the lungs every 6 (six) hours as needed for wheezing or shortness of breath.   Yes Historical Provider, MD  HYDROcodone-acetaminophen (NORCO/VICODIN) 5-325 MG per tablet Take 1 tablet by mouth every 6 (six) hours as needed. 11/24/13  Yes Varney Biles, MD  ibuprofen (ADVIL,MOTRIN) 600 MG tablet Take 1 tablet (600 mg total) by mouth every 6 (six) hours as needed. 11/24/13  Yes Varney Biles, MD  levothyroxine (SYNTHROID, LEVOTHROID) 50 MCG tablet Take  50 mcg by mouth daily before breakfast.   Yes Historical Provider, MD  LORazepam (ATIVAN) 1 MG tablet Take 1 mg by mouth every 8 (eight) hours as needed for anxiety.   Yes Historical Provider, MD  mometasone-formoterol (DULERA) 200-5 MCG/ACT AERO Inhale 2 puffs into the lungs 2 (two) times daily.   Yes Historical Provider, MD  promethazine (PHENERGAN) 25 MG tablet Take 1 tablet (25 mg total) by mouth every 6 (six) hours as needed for nausea. 11/24/13  Yes Ankit Kathrynn Humble, MD   BP 112/66  Pulse 66  Temp(Src) 99.2 F (37.3 C) (Oral)  Resp 18  Ht 5\' 4"  (1.626 m)  Wt 350 lb (158.759 kg)  BMI 60.05 kg/m2  SpO2 98%  LMP 11/18/2013 Physical Exam   Constitutional: She is oriented to person, place, and time. She appears well-developed and well-nourished. No distress.  texting on the phone on entrance  HENT:  Head: Normocephalic and atraumatic.  Mouth/Throat: Oropharynx is clear and moist.  Eyes: Conjunctivae are normal. Pupils are equal, round, and reactive to light.  Neck: Normal range of motion. Neck supple.  Cardiovascular: Normal rate, regular rhythm and intact distal pulses.   Pulmonary/Chest: Effort normal and breath sounds normal. She has no wheezes. She has no rales.  Abdominal: Soft. Bowel sounds are normal. There is no tenderness. There is no rebound and no guarding.  Musculoskeletal: Normal range of motion. She exhibits no edema and no tenderness.  Neurological: She is alert and oriented to person, place, and time. She has normal reflexes.  Skin: Skin is warm and dry.  Psychiatric: She has a normal mood and affect.    ED Course  Procedures (including critical care time) Labs Review Labs Reviewed  URINALYSIS, ROUTINE W REFLEX MICROSCOPIC - Abnormal; Notable for the following:    Color, Urine AMBER (*)    APPearance TURBID (*)    Specific Gravity, Urine 1.039 (*)    Bilirubin Urine SMALL (*)    Ketones, ur 15 (*)    Protein, ur 30 (*)    Leukocytes, UA SMALL (*)    All other components within normal limits  BASIC METABOLIC PANEL - Abnormal; Notable for the following:    Sodium 135 (*)    Anion gap 16 (*)    All other components within normal limits  CBC WITH DIFFERENTIAL - Abnormal; Notable for the following:    WBC 11.7 (*)    All other components within normal limits  URINE MICROSCOPIC-ADD ON - Abnormal; Notable for the following:    Squamous Epithelial / LPF MANY (*)    Bacteria, UA MANY (*)    Crystals CA OXALATE CRYSTALS (*)    All other components within normal limits    Imaging Review No results found.   EKG Interpretation None      MDM   Final diagnoses:  None   Medications  ibuprofen  (ADVIL,MOTRIN) tablet 400 mg (400 mg Oral Given 12/02/13 2210)  oxyCODONE-acetaminophen (PERCOCET/ROXICET) 5-325 MG per tablet 1 tablet (1 tablet Oral Given 12/02/13 2210)  ondansetron (ZOFRAN-ODT) disintegrating tablet 8 mg (8 mg Oral Given 12/02/13 2210)  ketorolac (TORADOL) injection 60 mg (60 mg Intramuscular Given 12/03/13 0316)  nitrofurantoin (macrocrystal-monohydrate) (MACROBID) capsule 100 mg (100 mg Oral Given 12/03/13 0316)  phenazopyridine (PYRIDIUM) tablet 200 mg (200 mg Oral Given 12/03/13 0315)  dicyclomine (BENTYL) injection 20 mg (20 mg Intramuscular Given 12/03/13 0413)  methocarbamol (ROBAXIN) tablet 1,000 mg (1,000 mg Oral Given 12/03/13 0413)  HYDROmorphone (DILAUDID) tablet 2 mg (  2 mg Oral Given 12/03/13 0437)     Pain has been going on for over 2 weeks.   Reviewed [place] CT with Dr. Pascal Lux of radiology, stones only in the kidney.  Symptoms have been going on for weeks.  Have tried multiple medication and this spasm is unresponsive to these medications.  Follow up with your PMD in the am. Will treat with antibiotics pain medication and muscles relaxants.    Carlisle Beers, MD 12/03/13 [phone]

## 2014-02-15 ENCOUNTER — Other Ambulatory Visit: Payer: Self-pay

## 2014-02-22 ENCOUNTER — Encounter (HOSPITAL_COMMUNITY): Payer: Self-pay | Admitting: Emergency Medicine

## 2014-02-22 ENCOUNTER — Emergency Department (HOSPITAL_COMMUNITY)
Admission: EM | Admit: 2014-02-22 | Discharge: 2014-02-22 | Disposition: A | Discharge status: Home / Self Care | Payer: Medicaid Other | Attending: Emergency Medicine | Admitting: Emergency Medicine

## 2014-02-22 ENCOUNTER — Other Ambulatory Visit: Payer: Self-pay

## 2014-02-22 ENCOUNTER — Emergency Department (HOSPITAL_COMMUNITY): Payer: Medicaid Other

## 2014-02-22 DIAGNOSIS — E669 Obesity, unspecified: Secondary | ICD-10-CM | POA: Diagnosis not present

## 2014-02-22 DIAGNOSIS — Z8679 Personal history of other diseases of the circulatory system: Secondary | ICD-10-CM | POA: Insufficient documentation

## 2014-02-22 DIAGNOSIS — R110 Nausea: Secondary | ICD-10-CM | POA: Diagnosis not present

## 2014-02-22 DIAGNOSIS — R0789 Other chest pain: Secondary | ICD-10-CM | POA: Diagnosis not present

## 2014-02-22 DIAGNOSIS — Z7951 Long term (current) use of inhaled steroids: Secondary | ICD-10-CM | POA: Insufficient documentation

## 2014-02-22 DIAGNOSIS — E079 Disorder of thyroid, unspecified: Secondary | ICD-10-CM | POA: Insufficient documentation

## 2014-02-22 DIAGNOSIS — M797 Fibromyalgia: Secondary | ICD-10-CM | POA: Insufficient documentation

## 2014-02-22 DIAGNOSIS — Z8659 Personal history of other mental and behavioral disorders: Secondary | ICD-10-CM | POA: Insufficient documentation

## 2014-02-22 DIAGNOSIS — Z8719 Personal history of other diseases of the digestive system: Secondary | ICD-10-CM | POA: Insufficient documentation

## 2014-02-22 DIAGNOSIS — Z720 Tobacco use: Secondary | ICD-10-CM | POA: Diagnosis not present

## 2014-02-22 DIAGNOSIS — Z79899 Other long term (current) drug therapy: Secondary | ICD-10-CM | POA: Insufficient documentation

## 2014-02-22 DIAGNOSIS — J45901 Unspecified asthma with (acute) exacerbation: Secondary | ICD-10-CM | POA: Diagnosis not present

## 2014-02-22 DIAGNOSIS — Z791 Long term (current) use of non-steroidal anti-inflammatories (NSAID): Secondary | ICD-10-CM | POA: Diagnosis not present

## 2014-02-22 DIAGNOSIS — R197 Diarrhea, unspecified: Secondary | ICD-10-CM | POA: Diagnosis not present

## 2014-02-22 DIAGNOSIS — J45909 Unspecified asthma, uncomplicated: Secondary | ICD-10-CM | POA: Diagnosis present

## 2014-02-22 MED ORDER — GUAIFENESIN 100 MG/5ML PO LIQD: 100.0000 mg | ORAL | Status: DC | PRN

## 2014-02-22 MED ORDER — ALBUTEROL SULFATE HFA 108 (90 BASE) MCG/ACT IN AERS
2.0000 | INHALATION_SPRAY | RESPIRATORY_TRACT | Status: DC | PRN
  Administered 2014-02-22: 2 via RESPIRATORY_TRACT
  Filled 2014-02-22: qty 6.7

## 2014-02-22 MED ORDER — ALBUTEROL SULFATE (2.5 MG/3ML) 0.083% IN NEBU
5.0000 mg | INHALATION_SOLUTION | Freq: Once | RESPIRATORY_TRACT | Status: AC
  Administered 2014-02-22: 5 mg via RESPIRATORY_TRACT
  Filled 2014-02-22: qty 6

## 2014-02-22 NOTE — ED Provider Notes (Signed)
CSN: [id number]     Arrival date & time 02/22/14  1152 History  This chart was scribed for non-physician practitioner, Noland Fordyce, PA-C working with Virgel Manifold, MD, by Erling Conte, ED Scribe. This patient was seen in room WTR7/WTR7 and the patient's care was started at 12:47 PM.    Chief Complaint  Patient presents with  . Asthma  . Chest Pain    The history is provided by the patient. No language interpreter was used.    HPI Comments: Cindy Hoffman is a 38 y.o. female who presents to the Emergency Department complaining of increased shortness of breath and chest tightness for 2 days. Pt has a history of asthma which she has had all her life. She has had associated productive cough, diarrhea last night and nausea. States chest pain feels like it is more in her muscles and aching rather than in her heart.  She states that she does not currently have nausea, diarrhea or abdominal pain. She took Imodium last night which did help her diarrhea. Pt has not been using her inhaler or nebulizer. She denies any fever, vomiting, chills, or body aches. Denies hx of CAD.  States she has had prednisone before for her asthma but states it causes her severe muscle aches.   Past Medical History  Diagnosis Date  . Asthma   . Migraine   . Obesity   . Thyroid disease   . Fibromyalgia   . Anxiety   . GERD (gastroesophageal reflux disease)   . Fracture of right foot   . Anxiety   . Complication of anesthesia     "STATES WAKES UP AND CANT BREATHE AND NEEDS BREATHING TREATMENT  IN PACU"  . Dysrhythmia     palpitations- "from thyroid"  . Sleep apnea      NO CPAP--could not tolerates mask  . Viral respiratory infection 06/26/13    ? influenza   Past Surgical History  Procedure Laterality Date  . Tubal ligation    . Nasal septum surgery    . Wisdom tooth extraction    . Thyroid lobectomy Right 07/19/2013    Procedure: THYROID LOBECTOMY;  Surgeon: Harl Bowie, MD;  Location: [facility];   Service: General;  Laterality: Right;   Family History  Problem Relation Age of Onset  . Hypertension Mother   . Hypertension Father   . Diabetes Sister   . Cancer Paternal Grandmother     breast   History  Substance Use Topics  . Smoking status: Light Tobacco Smoker -- 0.25 packs/day for 20 years    Types: Cigarettes  . Smokeless tobacco: Never Used  . Alcohol Use: No     Comment: socially   OB History   Grav Para Term Preterm Abortions TAB SAB Ect Mult Living                 Review of Systems  Constitutional: Negative for fever and chills.  Respiratory: Positive for cough (productive), chest tightness and shortness of breath.   Cardiovascular: Positive for chest pain.  Gastrointestinal: Positive for nausea and diarrhea. Negative for vomiting and abdominal pain.  Musculoskeletal: Negative for myalgias.      Allergies  Beta adrenergic blockers  Home Medications   Prior to Admission medications   Medication Sig Start Date End Date Taking? Authorizing Provider  albuterol (PROVENTIL HFA;VENTOLIN HFA) 108 (90 BASE) MCG/ACT inhaler Inhale 1 puff into the lungs every 6 (six) hours as needed for wheezing or shortness of breath.  Historical Provider, MD  guaiFENesin (ROBITUSSIN) 100 MG/5ML liquid Take 5-10 mLs (100-200 mg total) by mouth every 4 (four) hours as needed for cough. 02/22/14   Noland Fordyce, PA-C  HYDROcodone-acetaminophen (NORCO/VICODIN) 5-325 MG per tablet Take 1 tablet by mouth every 6 (six) hours as needed. 11/24/13   Varney Biles, MD  ibuprofen (ADVIL,MOTRIN) 600 MG tablet Take 1 tablet (600 mg total) by mouth every 6 (six) hours as needed. 11/24/13   Varney Biles, MD  levothyroxine (SYNTHROID, LEVOTHROID) 50 MCG tablet Take 50 mcg by mouth daily before breakfast.    Historical Provider, MD  LORazepam (ATIVAN) 1 MG tablet Take 1 mg by mouth every 8 (eight) hours as needed for anxiety.    Historical Provider, MD  meloxicam (MOBIC) 7.5 MG tablet Take 1 tablet  (7.5 mg total) by mouth daily. 12/03/13   April K Palumbo-Rasch, MD  methocarbamol (ROBAXIN) 500 MG tablet Take 1 tablet (500 mg total) by mouth 2 (two) times daily. 12/03/13   April K Palumbo-Rasch, MD  mometasone-formoterol [facility]) 200-5 MCG/ACT AERO Inhale 2 puffs into the lungs 2 (two) times daily.    Historical Provider, MD  nitrofurantoin, macrocrystal-monohydrate, (MACROBID) 100 MG capsule Take 1 capsule (100 mg total) by mouth 2 (two) times daily. X 7 days 12/03/13   April K Palumbo-Rasch, MD  oxyCODONE-acetaminophen (PERCOCET) 5-325 MG per tablet Take 1 tablet by mouth every 6 (six) hours as needed. 12/03/13   April K Palumbo-Rasch, MD  phenazopyridine (PYRIDIUM) 200 MG tablet Take 1 tablet (200 mg total) by mouth 3 (three) times daily. 12/03/13   April K Palumbo-Rasch, MD  promethazine (PHENERGAN) 25 MG tablet Take 1 tablet (25 mg total) by mouth every 6 (six) hours as needed for nausea. 11/24/13   Varney Biles, MD   Triage Vitals: BP 120/76  Pulse 79  Temp(Src) 98 F (36.7 C) (Oral)  Resp 20  SpO2 100%  LMP 02/22/2014  Physical Exam  Nursing note and vitals reviewed. Constitutional: She is oriented to person, place, and time. She appears well-developed and well-nourished.  HENT:  Head: Normocephalic and atraumatic.  Eyes: EOM are normal.  Neck: Normal range of motion.  Cardiovascular: Normal rate, regular rhythm and normal heart sounds.   Pulmonary/Chest: Effort normal. She has wheezes (mild expiatory in upper lung fields (after neb tx)).  Musculoskeletal: Normal range of motion.  Neurological: She is alert and oriented to person, place, and time.  Skin: Skin is warm and dry.  Psychiatric: She has a normal mood and affect. Her behavior is normal.    ED Course  Procedures (including critical care time)  DIAGNOSTIC STUDIES: Oxygen Saturation is 100% on RA, normal by my interpretation.    COORDINATION OF CARE: 12:35 PM- Will order CXR and albuterol neb treatment. Pt advised of  plan for treatment and pt agrees.  12:58 PM- Will discharge pt with Robitussin and advised to f/u with PCP if her symptoms do not improve.  Pt advised of plan for treatment and pt agrees.   Labs Review Labs Reviewed - No data to display  Imaging Review Dg Chest 2 View (if Patient Has Fever And/or Copd)  02/22/2014   CLINICAL DATA:  Acute chest pain.  EXAM: CHEST  2 VIEW  COMPARISON:  Sep 16, 2013.  FINDINGS: The heart size and mediastinal contours are within normal limits. Both lungs are clear. No pneumothorax or pleural effusion is noted. The visualized skeletal structures are unremarkable.  IMPRESSION: No acute cardiopulmonary abnormality seen.   Electronically Signed  By: Sabino Dick M.D.   On: 02/22/2014 12:20     EKG Interpretation None      MDM   Final diagnoses:  Asthma exacerbation    Pt with hx of asthma presenting to ED with exacerbation of her asthma over last 2 days. Has not been using her inhaler. Pt does have nebulizer machine at home, however, has not tried that either.  Pt had albuterol neb tx in ED, states her breathing has improved. Tx was provided in triage. Lungs after tx: mild expiratory wheeze in upper lung fields.  CXR: no acute cardiopulmonary abnormality. Not concerned for ACS or PE.  Will discharge pt home with albuterol inhaler. Advised pt to f/u with PCP for recheck of symptoms in 2-3 days if not improving. Pt has intolerance to prednisone, "causes severe muscle aches" so this medication was not prescribed today. Return precautions provided. Pt verbalized understanding and agreement with tx plan.   I personally performed the services described in this documentation, which was scribed in my presence. The recorded information has been reviewed and is accurate.     Noland Fordyce, PA-C 02/22/14 1331

## 2014-02-22 NOTE — ED Notes (Signed)
Per pt, states history of asthma-has'nt felt well-increased SOB-chest tightness for 2 days-productive cough-has not used rescue inhaler or neb

## 2014-02-22 NOTE — Discharge Instructions (Signed)

## 2014-02-25 NOTE — ED Provider Notes (Signed)
Medical screening examination/treatment/procedure(s) were performed by non-physician practitioner and as supervising physician I was immediately available for consultation/collaboration.   EKG Interpretation   Date/Time:  Friday February 22 2014 11:49:35 EDT Ventricular Rate:  89 PR Interval:  150 QRS Duration: 88 QT Interval:  372 QTC Calculation: 452 R Axis:   85 Text Interpretation:  Sinus rhythm with occasional Premature ventricular  complexes Otherwise normal ECG ED PHYSICIAN INTERPRETATION AVAILABLE IN  [facility] Confirmed by TEST, Record ([id number]) on 02/24/2014 12:25:49  PM       Virgel Manifold, MD 02/25/14 1300

## 2014-03-11 ENCOUNTER — Other Ambulatory Visit: Payer: Self-pay | Admitting: Internal Medicine

## 2014-03-22 ENCOUNTER — Encounter (HOSPITAL_COMMUNITY): Payer: Self-pay | Admitting: Emergency Medicine

## 2014-03-22 ENCOUNTER — Emergency Department (HOSPITAL_COMMUNITY)
Admission: EM | Admit: 2014-03-22 | Discharge: 2014-03-23 | Disposition: A | Discharge status: Home / Self Care | Payer: Medicaid Other | Attending: Emergency Medicine | Admitting: Emergency Medicine

## 2014-03-22 DIAGNOSIS — Z79899 Other long term (current) drug therapy: Secondary | ICD-10-CM | POA: Insufficient documentation

## 2014-03-22 DIAGNOSIS — E039 Hypothyroidism, unspecified: Secondary | ICD-10-CM | POA: Insufficient documentation

## 2014-03-22 DIAGNOSIS — Z7951 Long term (current) use of inhaled steroids: Secondary | ICD-10-CM | POA: Insufficient documentation

## 2014-03-22 DIAGNOSIS — E669 Obesity, unspecified: Secondary | ICD-10-CM | POA: Diagnosis not present

## 2014-03-22 DIAGNOSIS — Z8719 Personal history of other diseases of the digestive system: Secondary | ICD-10-CM | POA: Insufficient documentation

## 2014-03-22 DIAGNOSIS — Z720 Tobacco use: Secondary | ICD-10-CM | POA: Insufficient documentation

## 2014-03-22 DIAGNOSIS — Z8781 Personal history of (healed) traumatic fracture: Secondary | ICD-10-CM | POA: Diagnosis not present

## 2014-03-22 DIAGNOSIS — K219 Gastro-esophageal reflux disease without esophagitis: Secondary | ICD-10-CM | POA: Diagnosis not present

## 2014-03-22 DIAGNOSIS — Z8659 Personal history of other mental and behavioral disorders: Secondary | ICD-10-CM | POA: Insufficient documentation

## 2014-03-22 DIAGNOSIS — M797 Fibromyalgia: Secondary | ICD-10-CM | POA: Insufficient documentation

## 2014-03-22 DIAGNOSIS — J45909 Unspecified asthma, uncomplicated: Secondary | ICD-10-CM | POA: Diagnosis present

## 2014-03-22 DIAGNOSIS — J45901 Unspecified asthma with (acute) exacerbation: Secondary | ICD-10-CM | POA: Insufficient documentation

## 2014-03-22 DIAGNOSIS — R062 Wheezing: Secondary | ICD-10-CM

## 2014-03-22 MED ORDER — ALBUTEROL SULFATE (2.5 MG/3ML) 0.083% IN NEBU
5.0000 mg | INHALATION_SOLUTION | Freq: Once | RESPIRATORY_TRACT | Status: AC
  Administered 2014-03-22: 5 mg via RESPIRATORY_TRACT
  Filled 2014-03-22: qty 6

## 2014-03-22 NOTE — ED Notes (Signed)
Pt. reports progressing asthma for several weeks with productive cough , wheezing and chest congestion unrelieved by rescue MDI and nebulizer treatment at home . Denies fever or chills.

## 2014-03-23 ENCOUNTER — Emergency Department (HOSPITAL_COMMUNITY): Payer: Medicaid Other

## 2014-03-23 MED ORDER — OXYCODONE-ACETAMINOPHEN 5-325 MG PO TABS
1.0000 | ORAL_TABLET | Freq: Once | ORAL | Status: AC
  Administered 2014-03-23: 1 via ORAL
  Filled 2014-03-23: qty 1

## 2014-03-23 MED ORDER — MAGNESIUM SULFATE 2 GM/50ML IV SOLN
2.0000 g | Freq: Once | INTRAVENOUS | Status: AC
  Administered 2014-03-23: 2 g via INTRAVENOUS
  Filled 2014-03-23: qty 50

## 2014-03-23 MED ORDER — METHYLPREDNISOLONE SODIUM SUCC 125 MG IJ SOLR
125.0000 mg | Freq: Once | INTRAMUSCULAR | Status: AC
  Administered 2014-03-23: 125 mg via INTRAVENOUS
  Filled 2014-03-23: qty 2

## 2014-03-23 MED ORDER — OXYCODONE-ACETAMINOPHEN 5-325 MG PO TABS: 1.0000 | ORAL_TABLET | Freq: Four times a day (QID) | ORAL | Status: DC | PRN

## 2014-03-23 MED ORDER — PREDNISONE 10 MG PO TABS: 20.0000 mg | ORAL_TABLET | Freq: Every day | ORAL | Status: DC

## 2014-03-23 MED ORDER — HYDROCODONE-ACETAMINOPHEN 5-325 MG PO TABS
1.0000 | ORAL_TABLET | Freq: Once | ORAL | Status: AC
  Administered 2014-03-23: 1 via ORAL
  Filled 2014-03-23: qty 1

## 2014-03-23 MED ORDER — IPRATROPIUM-ALBUTEROL 0.5-2.5 (3) MG/3ML IN SOLN
3.0000 mL | Freq: Once | RESPIRATORY_TRACT | Status: AC
  Administered 2014-03-23: 3 mL via RESPIRATORY_TRACT
  Filled 2014-03-23: qty 3

## 2014-03-23 MED ORDER — ALBUTEROL SULFATE (2.5 MG/3ML) 0.083% IN NEBU
5.0000 mg | INHALATION_SOLUTION | Freq: Once | RESPIRATORY_TRACT | Status: AC
  Administered 2014-03-23: 5 mg via RESPIRATORY_TRACT
  Filled 2014-03-23: qty 6

## 2014-03-23 MED ORDER — ONDANSETRON HCL 4 MG/2ML IJ SOLN
4.0000 mg | Freq: Once | INTRAMUSCULAR | Status: AC
  Administered 2014-03-23: 4 mg via INTRAVENOUS
  Filled 2014-03-23: qty 2

## 2014-03-23 NOTE — ED Provider Notes (Signed)
CSN: [id number]     Arrival date & time 03/22/14  2321 History   First MD Initiated Contact with Patient 03/22/14 2353     Chief Complaint  Patient presents with  . Asthma     (Consider location/radiation/quality/duration/timing/severity/associated sxs/prior Treatment) HPI Comments: Patient long-standing history of asthma, states for the past 2 weeks.  She's been having frequent episodes of wheezing, not relieved by her Dulera and albuterol nebs.  Resents tonight with productive cough and chest discomfort, which is exacerbated her chronic low back pain.  States she cannot take steroids as they cause severe bone pain  Patient is a 38 y.o. female presenting with asthma. The history is provided by the patient.  Asthma This is a new problem. The current episode started 1 to 4 weeks ago. The problem occurs intermittently. The problem has been gradually worsening. Associated symptoms include chest pain and coughing. Pertinent negatives include no congestion, nausea, rash or vomiting. The symptoms are aggravated by exertion. Treatments tried: Dulera and albuterol nebs. The treatment provided no relief.    Past Medical History  Diagnosis Date  . Asthma   . Migraine   . Obesity   . Thyroid disease   . Fibromyalgia   . Anxiety   . GERD (gastroesophageal reflux disease)   . Fracture of right foot   . Anxiety   . Complication of anesthesia     "STATES WAKES UP AND CANT BREATHE AND NEEDS BREATHING TREATMENT  IN PACU"  . Dysrhythmia     palpitations- "from thyroid"  . Sleep apnea      NO CPAP--could not tolerates mask  . Viral respiratory infection 06/26/13    ? influenza   Past Surgical History  Procedure Laterality Date  . Tubal ligation    . Nasal septum surgery    . Wisdom tooth extraction    . Thyroid lobectomy Right 07/19/2013    Procedure: THYROID LOBECTOMY;  Surgeon: Harl Bowie, MD;  Location: [facility];  Service: General;  Laterality: Right;   Family History  Problem  Relation Age of Onset  . Hypertension Mother   . Hypertension Father   . Diabetes Sister   . Cancer Paternal Grandmother     breast   History  Substance Use Topics  . Smoking status: Light Tobacco Smoker -- 0.25 packs/day for 20 years    Types: Cigarettes  . Smokeless tobacco: Never Used  . Alcohol Use: No     Comment: socially   OB History    No data available     Review of Systems  HENT: Negative for congestion.   Respiratory: Positive for cough and wheezing. Negative for shortness of breath.   Cardiovascular: Positive for chest pain.  Gastrointestinal: Negative for nausea and vomiting.  Musculoskeletal: Positive for back pain.  Skin: Negative for pallor and rash.      Allergies  Beta adrenergic blockers  Home Medications   Prior to Admission medications   Medication Sig Start Date End Date Taking? Authorizing Provider  albuterol (PROVENTIL HFA;VENTOLIN HFA) 108 (90 BASE) MCG/ACT inhaler Inhale 1 puff into the lungs every 6 (six) hours as needed for wheezing or shortness of breath.   Yes Historical Provider, MD  ibuprofen (ADVIL,MOTRIN) 600 MG tablet Take 1 tablet (600 mg total) by mouth every 6 (six) hours as needed. 11/24/13  Yes Ankit Kathrynn Humble, MD  LORazepam (ATIVAN) 1 MG tablet Take 1 mg by mouth every 8 (eight) hours as needed for anxiety.   Yes Historical Provider, MD  mometasone-formoterol (DULERA) 200-5 MCG/ACT AERO Inhale 2 puffs into the lungs 2 (two) times daily.   Yes Historical Provider, MD  guaiFENesin (ROBITUSSIN) 100 MG/5ML liquid Take 5-10 mLs (100-200 mg total) by mouth every 4 (four) hours as needed for cough. 02/22/14   Noland Fordyce, PA-C  HYDROcodone-acetaminophen (NORCO/VICODIN) 5-325 MG per tablet Take 1 tablet by mouth every 6 (six) hours as needed. 11/24/13   Varney Biles, MD  levothyroxine (SYNTHROID, LEVOTHROID) 50 MCG tablet Take 50 mcg by mouth daily before breakfast.    Historical Provider, MD  meloxicam (MOBIC) 7.5 MG tablet Take 1 tablet  (7.5 mg total) by mouth daily. 12/03/13   April K Palumbo-Rasch, MD  methocarbamol (ROBAXIN) 500 MG tablet Take 1 tablet (500 mg total) by mouth 2 (two) times daily. 12/03/13   April K Palumbo-Rasch, MD  nitrofurantoin, macrocrystal-monohydrate, (MACROBID) 100 MG capsule Take 1 capsule (100 mg total) by mouth 2 (two) times daily. X 7 days 12/03/13   April K Palumbo-Rasch, MD  oxyCODONE-acetaminophen (PERCOCET) 5-325 MG per tablet Take 1 tablet by mouth every 6 (six) hours as needed. 12/03/13   April K Palumbo-Rasch, MD  phenazopyridine (PYRIDIUM) 200 MG tablet Take 1 tablet (200 mg total) by mouth 3 (three) times daily. 12/03/13   April K Palumbo-Rasch, MD  promethazine (PHENERGAN) 25 MG tablet Take 1 tablet (25 mg total) by mouth every 6 (six) hours as needed for nausea. 11/24/13   Ankit Kathrynn Humble, MD   BP 126/68 mmHg  Pulse 88  Temp(Src) 97.7 F (36.5 C) (Oral)  Resp 20  Ht 5\' 4"  (1.626 m)  Wt 350 lb (158.759 kg)  BMI 60.05 kg/m2  SpO2 96%  LMP 03/19/2014 Physical Exam  Constitutional: She is oriented to person, place, and time. She appears well-developed and well-nourished.  Morbid obesity  HENT:  Head: Normocephalic.  Mouth/Throat: Oropharynx is clear and moist.  Eyes: Pupils are equal, round, and reactive to light.  Neck: Normal range of motion.  Cardiovascular: Normal rate and regular rhythm.   Pulmonary/Chest: Effort normal. No respiratory distress. She has wheezes. She exhibits no tenderness.  Worse with breathing   Abdominal: Soft.  Musculoskeletal: Normal range of motion.  Neurological: She is alert and oriented to person, place, and time.  Skin: Skin is warm and dry. No rash noted.  Vitals reviewed.   ED Course  Procedures (including critical care time) Labs Review Labs Reviewed - No data to display  Imaging Review Dg Chest 2 View  03/23/2014   CLINICAL DATA:  Shortness of breath. Tightness and congestion for 2 weeks. Wheezing.  EXAM: CHEST  2 VIEW  COMPARISON:  02/22/2014   FINDINGS: The heart size and mediastinal contours are within normal limits. Both lungs are clear. The visualized skeletal structures are unremarkable.  IMPRESSION: No active cardiopulmonary disease.   Electronically Signed   By: Lucienne Capers M.D.   On: 03/23/2014 01:55     EKG Interpretation None     patient's chest x-ray is negative for pneumonia.  She has been given an amp, albuterol and a DuoNeb treatment.  Still having significant wheezing, tightness and discomfort.  She's been given Vicodin for pain without any relief.  I have asked that an IV be established, and she be given IV magnesium, after which she will be re-examined After IV magnesium.  Patient still wheezing.  I discussed the importance of her taking steroids.  She agrees to this.  If she can have some pain medication or muscle relaxer due to her  side effects of muscle/bone pain with the use of steroids.  She'll be given 125 mg of Solu-Medrol IV and an additional albuterol treatment and then be reassessed. The possibility of admission has been discussed with the patient.  She flat out refuses stating she is a single parent and has a 81-year-old child at home after a third neb treatment IV Solumederol and 2Gm Magnesium no longer wheezing     Final diagnoses:  Wheezing         Garald Balding, NP 03/23/14 8638  Julianne Rice, MD 03/23/14 [phone]

## 2014-03-23 NOTE — ED Notes (Signed)
Patient asked for and received a Coke.

## 2014-03-23 NOTE — ED Notes (Signed)
Pt reports shortness of breath and wheezing x2 weeks - has been using home neb tx and MDI w/o relief. Pt became acutely more short of breath this evening. Pt speaking phrases, denies any recent cough/congestion or fever.

## 2014-03-23 NOTE — Discharge Instructions (Signed)

## 2014-04-27 ENCOUNTER — Emergency Department (HOSPITAL_COMMUNITY): Payer: Medicaid Other

## 2014-04-27 ENCOUNTER — Emergency Department (HOSPITAL_COMMUNITY)
Admission: EM | Admit: 2014-04-27 | Discharge: 2014-04-27 | Disposition: A | Discharge status: Home / Self Care | Payer: Medicaid Other | Attending: Emergency Medicine | Admitting: Emergency Medicine

## 2014-04-27 ENCOUNTER — Encounter (HOSPITAL_COMMUNITY): Payer: Self-pay | Admitting: *Deleted

## 2014-04-27 DIAGNOSIS — E079 Disorder of thyroid, unspecified: Secondary | ICD-10-CM | POA: Diagnosis not present

## 2014-04-27 DIAGNOSIS — M797 Fibromyalgia: Secondary | ICD-10-CM | POA: Diagnosis not present

## 2014-04-27 DIAGNOSIS — Z8669 Personal history of other diseases of the nervous system and sense organs: Secondary | ICD-10-CM | POA: Diagnosis not present

## 2014-04-27 DIAGNOSIS — Z8781 Personal history of (healed) traumatic fracture: Secondary | ICD-10-CM | POA: Insufficient documentation

## 2014-04-27 DIAGNOSIS — J45901 Unspecified asthma with (acute) exacerbation: Secondary | ICD-10-CM | POA: Diagnosis not present

## 2014-04-27 DIAGNOSIS — E669 Obesity, unspecified: Secondary | ICD-10-CM | POA: Insufficient documentation

## 2014-04-27 DIAGNOSIS — Z8719 Personal history of other diseases of the digestive system: Secondary | ICD-10-CM | POA: Insufficient documentation

## 2014-04-27 DIAGNOSIS — Z7952 Long term (current) use of systemic steroids: Secondary | ICD-10-CM | POA: Insufficient documentation

## 2014-04-27 DIAGNOSIS — F419 Anxiety disorder, unspecified: Secondary | ICD-10-CM | POA: Insufficient documentation

## 2014-04-27 DIAGNOSIS — Z791 Long term (current) use of non-steroidal anti-inflammatories (NSAID): Secondary | ICD-10-CM | POA: Insufficient documentation

## 2014-04-27 DIAGNOSIS — Z720 Tobacco use: Secondary | ICD-10-CM | POA: Insufficient documentation

## 2014-04-27 DIAGNOSIS — R0602 Shortness of breath: Secondary | ICD-10-CM | POA: Diagnosis present

## 2014-04-27 DIAGNOSIS — Z79899 Other long term (current) drug therapy: Secondary | ICD-10-CM | POA: Insufficient documentation

## 2014-04-27 MED ORDER — ALBUTEROL SULFATE (2.5 MG/3ML) 0.083% IN NEBU
2.5000 mg | INHALATION_SOLUTION | Freq: Once | RESPIRATORY_TRACT | Status: AC
  Administered 2014-04-27: 2.5 mg via RESPIRATORY_TRACT
  Filled 2014-04-27: qty 3

## 2014-04-27 MED ORDER — PREDNISONE 20 MG PO TABS
60.0000 mg | ORAL_TABLET | Freq: Once | ORAL | Status: AC
  Administered 2014-04-27: 60 mg via ORAL
  Filled 2014-04-27: qty 3

## 2014-04-27 MED ORDER — OXYCODONE-ACETAMINOPHEN 5-325 MG PO TABS: 1.0000 | ORAL_TABLET | Freq: Once | ORAL | Status: DC

## 2014-04-27 MED ORDER — OXYCODONE-ACETAMINOPHEN 5-325 MG PO TABS: 1.0000 | ORAL_TABLET | Freq: Four times a day (QID) | ORAL | Status: DC | PRN

## 2014-04-27 MED ORDER — IPRATROPIUM-ALBUTEROL 0.5-2.5 (3) MG/3ML IN SOLN
3.0000 mL | Freq: Once | RESPIRATORY_TRACT | Status: AC
  Administered 2014-04-27: 3 mL via RESPIRATORY_TRACT
  Filled 2014-04-27: qty 3

## 2014-04-27 MED ORDER — ALBUTEROL SULFATE (2.5 MG/3ML) 0.083% IN NEBU
5.0000 mg | INHALATION_SOLUTION | Freq: Once | RESPIRATORY_TRACT | Status: AC
  Administered 2014-04-27: 5 mg via RESPIRATORY_TRACT
  Filled 2014-04-27: qty 6

## 2014-04-27 MED ORDER — PREDNISONE 50 MG PO TABS: 50.0000 mg | ORAL_TABLET | Freq: Every day | ORAL | Status: DC

## 2014-04-27 NOTE — ED Notes (Addendum)
Pt reports hx of asthma, having productive cough x 1 week, no relief with inhalers, nebulizers and prednisone. spo2 96% at triage. Also reports irregular vaginal bleeding.

## 2014-04-27 NOTE — Discharge Instructions (Signed)
Return to the ED with any concerns including difficulty breathing despite using albuterol every 4 hours, not drinking fluids, decreased urine output, vomiting and not able to keep down liquids or medications, decreased level of alertness/lethargy, or any other alarming symptoms °

## 2014-04-27 NOTE — ED Notes (Signed)
2nd nebulizer treatment in progress , updated Mintz PA on pt.'s condition ordered 2nd neb treatment .

## 2014-04-27 NOTE — ED Notes (Signed)
Pt concerned that she needs breathing tx.  VSS pt to Triage 2 for [facility]

## 2014-04-27 NOTE — ED Provider Notes (Signed)
CSN: [id number]     Arrival date & time 04/27/14  1722 History   First MD Initiated Contact with Patient 04/27/14 2057     Chief Complaint  Patient presents with  . Asthma  . Shortness of Breath     (Consider location/radiation/quality/duration/timing/severity/associated sxs/prior Treatment) HPI  Pt presents with c/o cough and wheezing.  She states symptoms have been ongoing for the past 1 week.  Cough is productive of greenish sputum.  She has been using albuterol at home without much relief.  Pt has hx of asthma.  Last steroid use was several months ago.  No hx of intubation.  No fever/chills.  No leg swelling or chest pain.  There are no other associated systemic symptoms, there are no other alleviating or modifying factors.   Past Medical History  Diagnosis Date  . Asthma   . Migraine   . Obesity   . Thyroid disease   . Fibromyalgia   . Anxiety   . GERD (gastroesophageal reflux disease)   . Fracture of right foot   . Anxiety   . Complication of anesthesia     "STATES WAKES UP AND CANT BREATHE AND NEEDS BREATHING TREATMENT  IN PACU"  . Dysrhythmia     palpitations- "from thyroid"  . Sleep apnea      NO CPAP--could not tolerates mask  . Viral respiratory infection 06/26/13    ? influenza   Past Surgical History  Procedure Laterality Date  . Tubal ligation    . Nasal septum surgery    . Wisdom tooth extraction    . Thyroid lobectomy Right 07/19/2013    Procedure: THYROID LOBECTOMY;  Surgeon: Harl Bowie, MD;  Location: [facility];  Service: General;  Laterality: Right;   Family History  Problem Relation Age of Onset  . Hypertension Mother   . Hypertension Father   . Diabetes Sister   . Cancer Paternal Grandmother     breast   History  Substance Use Topics  . Smoking status: Light Tobacco Smoker -- 0.25 packs/day for 20 years    Types: Cigarettes  . Smokeless tobacco: Never Used  . Alcohol Use: No     Comment: socially   OB History    No data available      Review of Systems  ROS reviewed and all otherwise negative except for mentioned in HPI    Allergies  Beta adrenergic blockers  Home Medications   Prior to Admission medications   Medication Sig Start Date End Date Taking? Authorizing Provider  albuterol (PROVENTIL HFA;VENTOLIN HFA) 108 (90 BASE) MCG/ACT inhaler Inhale 1 puff into the lungs every 6 (six) hours as needed for wheezing or shortness of breath.    Historical Provider, MD  guaiFENesin (ROBITUSSIN) 100 MG/5ML liquid Take 5-10 mLs (100-200 mg total) by mouth every 4 (four) hours as needed for cough. 02/22/14   Noland Fordyce, PA-C  HYDROcodone-acetaminophen (NORCO/VICODIN) 5-325 MG per tablet Take 1 tablet by mouth every 6 (six) hours as needed. 11/24/13   Varney Biles, MD  ibuprofen (ADVIL,MOTRIN) 600 MG tablet Take 1 tablet (600 mg total) by mouth every 6 (six) hours as needed. 11/24/13   Varney Biles, MD  levothyroxine (SYNTHROID, LEVOTHROID) 50 MCG tablet Take 50 mcg by mouth daily before breakfast.    Historical Provider, MD  LORazepam (ATIVAN) 1 MG tablet Take 1 mg by mouth every 8 (eight) hours as needed for anxiety.    Historical Provider, MD  meloxicam (MOBIC) 7.5 MG tablet Take 1  tablet (7.5 mg total) by mouth daily. 12/03/13   April K Palumbo-Rasch, MD  methocarbamol (ROBAXIN) 500 MG tablet Take 1 tablet (500 mg total) by mouth 2 (two) times daily. 12/03/13   April K Palumbo-Rasch, MD  mometasone-formoterol [facility]) 200-5 MCG/ACT AERO Inhale 2 puffs into the lungs 2 (two) times daily.    Historical Provider, MD  nitrofurantoin, macrocrystal-monohydrate, (MACROBID) 100 MG capsule Take 1 capsule (100 mg total) by mouth 2 (two) times daily. X 7 days 12/03/13   April K Palumbo-Rasch, MD  oxyCODONE-acetaminophen (PERCOCET/ROXICET) 5-325 MG per tablet Take 1-2 tablets by mouth every 6 (six) hours as needed for severe pain. 04/27/14   Threasa Beards, MD  phenazopyridine (PYRIDIUM) 200 MG tablet Take 1 tablet (200 mg total) by mouth  3 (three) times daily. 12/03/13   April K Palumbo-Rasch, MD  predniSONE (DELTASONE) 50 MG tablet Take 1 tablet (50 mg total) by mouth daily. 04/27/14   Threasa Beards, MD  promethazine (PHENERGAN) 25 MG tablet Take 1 tablet (25 mg total) by mouth every 6 (six) hours as needed for nausea. 11/24/13   Ankit Kathrynn Humble, MD   BP 150/72 mmHg  Pulse 90  Temp(Src) 97.8 F (36.6 C) (Oral)  Resp 20  Ht 5\' 4"  (1.626 m)  Wt 366 lb 3 oz (166.102 kg)  BMI 62.83 kg/m2  SpO2 96%  LMP 04/24/2014  Vitals reviewed Physical Exam  Physical Examination: General appearance - alert, well appearing, and in no distress Mental status - alert, oriented to person, place, and time Eyes - no conjunctival injection, no scleral icterus Mouth - mucous membranes moist, pharynx normal without lesions Chest - mild bilateral expiratory wheezing, BSS, normal respiratory effort Heart - normal rate, regular rhythm, normal S1, S2, no murmurs, rubs, clicks or gallops Abdomen - soft, nontender, nondistended, no masses or organomegaly Extremities - peripheral pulses normal, no pedal edema, no clubbing or cyanosis Skin - normal coloration and turgor, no rashes  ED Course  Procedures (including critical care time) Labs Review Labs Reviewed - No data to display  Imaging Review Dg Chest 2 View (if Patient Has Fever And/or Copd)  04/27/2014   CLINICAL DATA:  Shortness of breath, cough  EXAM: CHEST  2 VIEW  COMPARISON:  03/23/2014  FINDINGS: The heart size and mediastinal contours are within normal limits. Both lungs are clear. The visualized skeletal structures are unremarkable.  IMPRESSION: No active cardiopulmonary disease.   Electronically Signed   By: Inez Catalina M.D.   On: 04/27/2014 20:14     EKG Interpretation None      MDM   Final diagnoses:  Asthma exacerbation    Pt presenting with c/o cough and wheezing.  Xray reassuring.   Xray images reviewed and interpreted by me as well.  Pt feels improved after nebs in  the ED.  Will start on steroids.   Discharged with strict return precautions.  Pt agreeable with plan.    Nursing notes including past medical history and social history reviewed and considered in documentation      Threasa Beards, MD 04/29/14 [phone]

## 2014-05-03 DIAGNOSIS — M5481 Occipital neuralgia: Secondary | ICD-10-CM

## 2014-05-03 HISTORY — DX: Occipital neuralgia: M54.81

## 2014-10-03 ENCOUNTER — Emergency Department (HOSPITAL_BASED_OUTPATIENT_CLINIC_OR_DEPARTMENT_OTHER): Admit: 2014-10-03 | Discharge: 2014-10-03 | Disposition: A | Discharge status: Home / Self Care | Payer: Medicaid Other

## 2014-10-03 ENCOUNTER — Encounter (HOSPITAL_COMMUNITY): Payer: Self-pay | Admitting: Emergency Medicine

## 2014-10-03 ENCOUNTER — Emergency Department (HOSPITAL_COMMUNITY)
Admission: EM | Admit: 2014-10-03 | Discharge: 2014-10-03 | Disposition: A | Discharge status: Home / Self Care | Payer: Medicaid Other | Attending: Emergency Medicine | Admitting: Emergency Medicine

## 2014-10-03 DIAGNOSIS — F419 Anxiety disorder, unspecified: Secondary | ICD-10-CM | POA: Diagnosis not present

## 2014-10-03 DIAGNOSIS — G8929 Other chronic pain: Secondary | ICD-10-CM | POA: Insufficient documentation

## 2014-10-03 DIAGNOSIS — M7989 Other specified soft tissue disorders: Secondary | ICD-10-CM

## 2014-10-03 DIAGNOSIS — E669 Obesity, unspecified: Secondary | ICD-10-CM | POA: Insufficient documentation

## 2014-10-03 DIAGNOSIS — K219 Gastro-esophageal reflux disease without esophagitis: Secondary | ICD-10-CM | POA: Insufficient documentation

## 2014-10-03 DIAGNOSIS — Z8669 Personal history of other diseases of the nervous system and sense organs: Secondary | ICD-10-CM | POA: Insufficient documentation

## 2014-10-03 DIAGNOSIS — Z8679 Personal history of other diseases of the circulatory system: Secondary | ICD-10-CM | POA: Diagnosis not present

## 2014-10-03 DIAGNOSIS — J45909 Unspecified asthma, uncomplicated: Secondary | ICD-10-CM | POA: Diagnosis not present

## 2014-10-03 DIAGNOSIS — R2243 Localized swelling, mass and lump, lower limb, bilateral: Secondary | ICD-10-CM | POA: Diagnosis not present

## 2014-10-03 DIAGNOSIS — Z79899 Other long term (current) drug therapy: Secondary | ICD-10-CM | POA: Diagnosis not present

## 2014-10-03 DIAGNOSIS — R600 Localized edema: Secondary | ICD-10-CM

## 2014-10-03 DIAGNOSIS — Z8781 Personal history of (healed) traumatic fracture: Secondary | ICD-10-CM | POA: Diagnosis not present

## 2014-10-03 DIAGNOSIS — M79605 Pain in left leg: Secondary | ICD-10-CM | POA: Diagnosis not present

## 2014-10-03 DIAGNOSIS — Z7951 Long term (current) use of inhaled steroids: Secondary | ICD-10-CM | POA: Insufficient documentation

## 2014-10-03 DIAGNOSIS — Z720 Tobacco use: Secondary | ICD-10-CM | POA: Diagnosis not present

## 2014-10-03 LAB — CBC WITH DIFFERENTIAL/PLATELET
BASOS PCT: 0 % (ref 0–1)
Basophils Absolute: 0 10*3/uL (ref 0.0–0.1)
Eosinophils Absolute: 0.3 10*3/uL (ref 0.0–0.7)
Eosinophils Relative: 3 % (ref 0–5)
HEMATOCRIT: 40.5 % (ref 36.0–46.0)
Hemoglobin: 12.9 g/dL (ref 12.0–15.0)
LYMPHS ABS: 2.4 10*3/uL (ref 0.7–4.0)
Lymphocytes Relative: 22 % (ref 12–46)
MCH: 26.3 pg (ref 26.0–34.0)
MCHC: 31.9 g/dL (ref 30.0–36.0)
MCV: 82.5 fL (ref 78.0–100.0)
MONOS PCT: 6 % (ref 3–12)
Monocytes Absolute: 0.6 10*3/uL (ref 0.1–1.0)
Neutro Abs: 7.4 10*3/uL (ref 1.7–7.7)
Neutrophils Relative %: 69 % (ref 43–77)
Platelets: 372 10*3/uL (ref 150–400)
RBC: 4.91 MIL/uL (ref 3.87–5.11)
RDW: 15.5 % (ref 11.5–15.5)
WBC: 10.7 10*3/uL — ABNORMAL HIGH (ref 4.0–10.5)

## 2014-10-03 LAB — BASIC METABOLIC PANEL
Anion gap: 10 (ref 5–15)
BUN: 10 mg/dL (ref 6–20)
CO2: 23 mmol/L (ref 22–32)
Calcium: 7.7 mg/dL — ABNORMAL LOW (ref 8.9–10.3)
Chloride: 102 mmol/L (ref 101–111)
Creatinine, Ser: 0.73 mg/dL (ref 0.44–1.00)
GFR calc Af Amer: >60 mL/min (ref >60)
GFR calc non Af Amer: >60 mL/min (ref >60)
Glucose, Bld: 86 mg/dL (ref 65–99)
POTASSIUM: 4.7 mmol/L (ref 3.5–5.1)
SODIUM: 135 mmol/L (ref 135–145)

## 2014-10-03 MED ORDER — FUROSEMIDE 20 MG PO TABS
20.0000 mg | ORAL_TABLET | Freq: Once | ORAL | Status: AC
  Administered 2014-10-03: 20 mg via ORAL
  Filled 2014-10-03: qty 1

## 2014-10-03 MED ORDER — FUROSEMIDE 20 MG PO TABS: 40.0000 mg | ORAL_TABLET | Freq: Every day | ORAL | Status: DC
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
## 2014-10-03 NOTE — Discharge Instructions (Signed)
Peripheral Edema °You have swelling in your legs (peripheral edema). This swelling is due to excess accumulation of salt and water in your body. Edema may be a sign of heart, kidney or liver disease, or a side effect of a medication. It may also be due to problems in the leg veins. Elevating your legs and using special support stockings may be very helpful, if the cause of the swelling is due to poor venous circulation. Avoid long periods of standing, whatever the cause. °Treatment of edema depends on identifying the cause. Chips, pretzels, pickles and other salty foods should be avoided. Restricting salt in your diet is almost always needed. Water pills (diuretics) are often used to remove the excess salt and water from your body via urine. These medicines prevent the kidney from reabsorbing sodium. This increases urine flow. °Diuretic treatment may also result in lowering of potassium levels in your body. Potassium supplements may be needed if you have to use diuretics daily. Daily weights can help you keep track of your progress in clearing your edema. You should call your caregiver for follow up care as recommended. °SEEK IMMEDIATE MEDICAL CARE IF:  °· You have increased swelling, pain, redness, or heat in your legs. °· You develop shortness of breath, especially when lying down. °· You develop chest or abdominal pain, weakness, or fainting. °· You have a fever. °Document Released: 05/27/2004 Document Revised: 07/12/2011 Document Reviewed: 05/07/2009 °Exit[employer]® Patient Information ©2015 [employer]. This information is not intended to replace advice given to you by your health care provider. Make sure you discuss any questions you have with your health care provider. ° °

## 2014-10-03 NOTE — Progress Notes (Signed)
*  PRELIMINARY RESULTS* Vascular Ultrasound Left lower extremity venous duplex has been completed.  Preliminary findings: negative for DVT. Focal area of interstitial fluid noted at area of concern, anterior left lower leg.   Landry Mellow, RDMS, RVT  10/03/2014, 7:35 PM

## 2014-10-03 NOTE — ED Notes (Signed)
Pt reports left leg swelling for a couple of days and severe lower left leg pain since yesterday, pt reports she called her MD and was instructed to come to the ED to be ruled out for a DVT. 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 Pt is able to bare weight on her left leg however it is painful. Pt also reports lightheaded for two days.

## 2014-12-16 ENCOUNTER — Emergency Department (HOSPITAL_COMMUNITY): Payer: Medicaid Other

## 2014-12-16 ENCOUNTER — Emergency Department (HOSPITAL_COMMUNITY)
Admission: EM | Admit: 2014-12-16 | Discharge: 2014-12-16 | Disposition: A | Discharge status: Home / Self Care | Payer: Medicaid Other | Attending: Emergency Medicine | Admitting: Emergency Medicine

## 2014-12-16 ENCOUNTER — Encounter (HOSPITAL_COMMUNITY): Payer: Self-pay | Admitting: Family Medicine

## 2014-12-16 DIAGNOSIS — Z79899 Other long term (current) drug therapy: Secondary | ICD-10-CM | POA: Insufficient documentation

## 2014-12-16 DIAGNOSIS — R1031 Right lower quadrant pain: Secondary | ICD-10-CM

## 2014-12-16 DIAGNOSIS — F419 Anxiety disorder, unspecified: Secondary | ICD-10-CM | POA: Diagnosis not present

## 2014-12-16 DIAGNOSIS — N390 Urinary tract infection, site not specified: Secondary | ICD-10-CM | POA: Diagnosis not present

## 2014-12-16 DIAGNOSIS — J45909 Unspecified asthma, uncomplicated: Secondary | ICD-10-CM | POA: Diagnosis not present

## 2014-12-16 DIAGNOSIS — Z8679 Personal history of other diseases of the circulatory system: Secondary | ICD-10-CM | POA: Insufficient documentation

## 2014-12-16 DIAGNOSIS — Z8781 Personal history of (healed) traumatic fracture: Secondary | ICD-10-CM | POA: Diagnosis not present

## 2014-12-16 DIAGNOSIS — Z720 Tobacco use: Secondary | ICD-10-CM | POA: Insufficient documentation

## 2014-12-16 LAB — CBC WITH DIFFERENTIAL/PLATELET
BASOS PCT: 1 % (ref 0–1)
Basophils Absolute: 0.1 10*3/uL (ref 0.0–0.1)
Eosinophils Absolute: 0.3 10*3/uL (ref 0.0–0.7)
Eosinophils Relative: 3 % (ref 0–5)
HEMATOCRIT: 39.8 % (ref 36.0–46.0)
HEMOGLOBIN: 13.1 g/dL (ref 12.0–15.0)
Lymphocytes Relative: 23 % (ref 12–46)
Lymphs Abs: 2.3 10*3/uL (ref 0.7–4.0)
MCH: 27.5 pg (ref 26.0–34.0)
MCHC: 32.9 g/dL (ref 30.0–36.0)
MCV: 83.4 fL (ref 78.0–100.0)
MONO ABS: 0.6 10*3/uL (ref 0.1–1.0)
Monocytes Relative: 6 % (ref 3–12)
NEUTROS ABS: 6.9 10*3/uL (ref 1.7–7.7)
NEUTROS PCT: 67 % (ref 43–77)
Platelets: 351 10*3/uL (ref 150–400)
RBC: 4.77 MIL/uL (ref 3.87–5.11)
RDW: 15.2 % (ref 11.5–15.5)
WBC: 10.1 10*3/uL (ref 4.0–10.5)

## 2014-12-16 LAB — I-STAT CHEM 8, ED
BUN: 9 mg/dL (ref 6–20)
CREATININE: 0.7 mg/dL (ref 0.44–1.00)
Calcium, Ion: 1.08 mmol/L — ABNORMAL LOW (ref 1.12–1.23)
Chloride: 104 mmol/L (ref 101–111)
Glucose, Bld: 95 mg/dL (ref 65–99)
HEMATOCRIT: 43 % (ref 36.0–46.0)
HEMOGLOBIN: 14.6 g/dL (ref 12.0–15.0)
POTASSIUM: 4 mmol/L (ref 3.5–5.1)
Sodium: 138 mmol/L (ref 135–145)
TCO2: 22 mmol/L (ref 0–100)

## 2014-12-16 LAB — HCG, SERUM, QUALITATIVE: Preg, Serum: NEGATIVE

## 2014-12-16 LAB — URINALYSIS, ROUTINE W REFLEX MICROSCOPIC
Bilirubin Urine: NEGATIVE
GLUCOSE, UA: NEGATIVE mg/dL
Hgb urine dipstick: NEGATIVE
Ketones, ur: NEGATIVE mg/dL
Nitrite: NEGATIVE
PH: 7.5 (ref 5.0–8.0)
Protein, ur: NEGATIVE mg/dL
Specific Gravity, Urine: 1.023 (ref 1.005–1.030)
Urobilinogen, UA: 0.2 mg/dL (ref 0.0–1.0)

## 2014-12-16 LAB — URINE MICROSCOPIC-ADD ON

## 2014-12-16 MED ORDER — MORPHINE SULFATE (PF) 4 MG/ML IV SOLN
4.0000 mg | Freq: Once | INTRAVENOUS | Status: AC
Start: 2014-12-16 — End: 2014-12-16
  Administered 2014-12-16: 4 mg via INTRAVENOUS
  Filled 2014-12-16: qty 1

## 2014-12-16 MED ORDER — PHENAZOPYRIDINE HCL 200 MG PO TABS: 200.0000 mg | ORAL_TABLET | Freq: Three times a day (TID) | ORAL | Status: DC

## 2014-12-16 MED ORDER — KETOROLAC TROMETHAMINE 30 MG/ML IJ SOLN
30.0000 mg | Freq: Once | INTRAMUSCULAR | Status: AC
  Administered 2014-12-16: 30 mg via INTRAVENOUS
  Filled 2014-12-16: qty 1

## 2014-12-16 MED ORDER — SULFAMETHOXAZOLE-TRIMETHOPRIM 800-160 MG PO TABS: 1.0000 | ORAL_TABLET | Freq: Two times a day (BID) | ORAL | Status: AC

## 2014-12-16 MED ORDER — IOHEXOL 300 MG/ML  SOLN
100.0000 mL | Freq: Once | INTRAMUSCULAR | Status: AC | PRN
  Administered 2014-12-16: 100 mL via INTRAVENOUS

## 2014-12-16 MED ORDER — KETAMINE HCL 10 MG/ML IJ SOLN
0.1500 mg/kg | Freq: Once | INTRAMUSCULAR | Status: AC
  Administered 2014-12-16: 23 mg via INTRAVENOUS
  Filled 2014-12-16: qty 2.3

## 2014-12-16 MED ORDER — IOHEXOL 300 MG/ML  SOLN: 25.0000 mL | Freq: Once | INTRAMUSCULAR | Status: DC | PRN

## 2014-12-16 MED ORDER — ONDANSETRON HCL 4 MG/2ML IJ SOLN
4.0000 mg | Freq: Once | INTRAMUSCULAR | Status: AC
  Administered 2014-12-16: 4 mg via INTRAVENOUS
  Filled 2014-12-16: qty 2

## 2014-12-16 MED ORDER — SODIUM CHLORIDE 0.9 % IV BOLUS (SEPSIS)
500.0000 mL | Freq: Once | INTRAVENOUS | Status: AC
  Administered 2014-12-16: 500 mL via INTRAVENOUS

## 2014-12-16 NOTE — ED Notes (Signed)
Pt here for RLQ/right groin pain. Denies N,V. sts she feels like something is going explode in her stomach.

## 2014-12-16 NOTE — ED Provider Notes (Signed)
CSN: [id number]     Arrival date & time 12/16/14  1326 History   First MD Initiated Contact with Patient 12/16/14 1501     Chief Complaint  Patient presents with  . Groin Pain  . Abdominal Pain     (Consider location/radiation/quality/duration/timing/severity/associated sxs/prior Treatment) Patient is a 39 y.o. female presenting with groin pain and abdominal pain. The history is provided by the patient.  Groin Pain This is a new problem. Associated symptoms include abdominal pain. Pertinent negatives include no chest pain, no headaches and no shortness of breath.  Abdominal Pain Associated symptoms: no chest pain, no diarrhea, no nausea, no shortness of breath and no vomiting    patient presents with pain in her right lower abdomen. States it began 3 days ago and now involves the leg her abdomen in the right lower quadrant abdomen also splits the whole abdomen. Worse with certain movements. Sometimes were switched positions. No nausea vomiting. No dysuria. States she has no change in her vaginal discharge. She has some constipation. The patient as both dull and sharp she's not had pain like this before. No fevers. Uncontrolled with her pain meds at home. States she has a history of sciatica and is in the pain clinic but this is a different pain. Denies possibility of pregnancy. States she's had her tubes tied and has not had sex in 8 months. Also states she's had her right fallopian tube removed .  Past Medical History  Diagnosis Date  . Asthma   . Migraine   . Obesity   . Thyroid disease   . Fibromyalgia   . Anxiety   . GERD (gastroesophageal reflux disease)   . Fracture of right foot   . Anxiety   . Complication of anesthesia     "STATES WAKES UP AND CANT BREATHE AND NEEDS BREATHING TREATMENT  IN PACU"  . Dysrhythmia     palpitations- "from thyroid"  . Sleep apnea      NO CPAP--could not tolerates mask  . Viral respiratory infection 06/26/13    ? influenza   Past Surgical  History  Procedure Laterality Date  . Tubal ligation    . Nasal septum surgery    . Wisdom tooth extraction    . Thyroid lobectomy Right 07/19/2013    Procedure: THYROID LOBECTOMY;  Surgeon: Harl Bowie, MD;  Location: [facility];  Service: General;  Laterality: Right;   Family History  Problem Relation Age of Onset  . Hypertension Mother   . Hypertension Father   . Diabetes Sister   . Cancer Paternal Grandmother     breast   Social History  Substance Use Topics  . Smoking status: Light Tobacco Smoker -- 0.25 packs/day for 20 years    Types: Cigarettes  . Smokeless tobacco: Never Used  . Alcohol Use: No     Comment: socially   OB History    No data available     Review of Systems  Constitutional: Negative for activity change and appetite change.  Eyes: Negative for pain.  Respiratory: Negative for chest tightness and shortness of breath.   Cardiovascular: Negative for chest pain and leg swelling.  Gastrointestinal: Positive for abdominal pain. Negative for nausea, vomiting and diarrhea.  Endocrine: Negative for polyuria.  Genitourinary: Negative for flank pain.  Musculoskeletal: Positive for back pain. Negative for neck stiffness.  Skin: Negative for rash.  Neurological: Negative for weakness, numbness and headaches.  Psychiatric/Behavioral: Negative for behavioral problems.      Allergies  Beta adrenergic blockers  Home Medications   Prior to Admission medications   Medication Sig Start Date End Date Taking? Authorizing Provider  albuterol (PROVENTIL HFA;VENTOLIN HFA) 108 (90 BASE) MCG/ACT inhaler Inhale 1 puff into the lungs every 6 (six) hours as needed for wheezing or shortness of breath.   Yes Historical Provider, MD  furosemide (LASIX) 20 MG tablet Take 2 tablets (40 mg total) by mouth daily. 10/03/14  Yes Etta Quill, NP  ibuprofen (ADVIL,MOTRIN) 800 MG tablet Take 800 mg by mouth every 8 (eight) hours as needed for moderate pain.   Yes Historical Provider,  MD  LORazepam (ATIVAN) 1 MG tablet Take 1 mg by mouth every 8 (eight) hours as needed for anxiety.   Yes Historical Provider, MD  modafinil (PROVIGIL) 200 MG tablet Take 100 mg by mouth daily.   Yes Historical Provider, MD  mometasone-formoterol (DULERA) 200-5 MCG/ACT AERO Inhale 2 puffs into the lungs 2 (two) times daily.   Yes Historical Provider, MD  ondansetron (ZOFRAN-ODT) 4 MG disintegrating tablet Take 4 mg by mouth every 8 (eight) hours as needed. Nausea 10/10/14  Yes Historical Provider, MD  oxyCODONE-acetaminophen (PERCOCET/ROXICET) 5-325 MG per tablet Take 1-2 tablets by mouth every 6 (six) hours as needed for severe pain. Patient taking differently: Take 1-2 tablets by mouth 2 (two) times daily.  04/27/14  Yes Alfonzo Beers, MD  pantoprazole (PROTONIX) 40 MG tablet Take 40 mg by mouth daily.   Yes Historical Provider, MD  zolpidem (AMBIEN) 10 MG tablet Take 10 mg by mouth at bedtime as needed for sleep.   Yes Historical Provider, MD  phenazopyridine (PYRIDIUM) 200 MG tablet Take 1 tablet (200 mg total) by mouth 3 (three) times daily. 12/16/14   Davonna Belling, MD  sulfamethoxazole-trimethoprim (BACTRIM DS,SEPTRA DS) 800-160 MG per tablet Take 1 tablet by mouth 2 (two) times daily. 12/16/14 12/23/14  Davonna Belling, MD   BP 142/58 mmHg  Pulse 73  Temp(Src) 98.2 F (36.8 C)  Resp 20  Wt 343 lb 14.7 oz (156 kg)  SpO2 95%  LMP 12/05/2014 Physical Exam  Constitutional: She is oriented to person, place, and time. She appears well-developed and well-nourished.  HENT:  Head: Normocephalic and atraumatic.  Eyes: EOM are normal. Pupils are equal, round, and reactive to light.  Neck: Normal range of motion. Neck supple.  Cardiovascular: Normal rate, regular rhythm and normal heart sounds.   No murmur heard. Pulmonary/Chest: Effort normal and breath sounds normal. No respiratory distress. She has no wheezes. She has no rales.  Abdominal: Soft. She exhibits no distension. There is  tenderness. There is no rebound and no guarding.  Patient is morbidly obese. Right lower quadrant/right groin tenderness. No rebound or guarding. No hernias. Somewhat diffuse abdominal tenderness also.  Musculoskeletal: Normal range of motion.  Neurological: She is alert and oriented to person, place, and time. No cranial nerve deficit.  Skin: Skin is warm and dry.  Psychiatric: She has a normal mood and affect. Her speech is normal.  Nursing note and vitals reviewed.   ED Course  Procedures (including critical care time) Labs Review Labs Reviewed  URINALYSIS, ROUTINE W REFLEX MICROSCOPIC (NOT AT [facility]) - Abnormal; Notable for the following:    Leukocytes, UA SMALL (*)    All other components within normal limits  URINE MICROSCOPIC-ADD ON - Abnormal; Notable for the following:    Squamous Epithelial / LPF MANY (*)    Bacteria, UA MANY (*)    All other components within normal limits  I-STAT CHEM 8, ED - Abnormal; Notable for the following:    Calcium, Ion 1.08 (*)    All other components within normal limits  CBC WITH DIFFERENTIAL/PLATELET  HCG, SERUM, QUALITATIVE    Imaging Review US Transvaginal Non-ob  12/16/2014   CLINICAL DATA:  Right lower quadrant abdominal pain for 3 days.  EXAM: TRANSABDOMINAL AND TRANSVAGINAL ULTRASOUND OF PELVIS  DOPPLER ULTRASOUND OF OVARIES  TECHNIQUE: Both transabdominal and transvaginal ultrasound examinations of the pelvis were performed. Transabdominal technique was performed for global imaging of the pelvis including uterus, ovaries, adnexal regions, and pelvic cul-de-sac.  It was necessary to proceed with endovaginal exam following the transabdominal exam to visualize the uterus, ovaries and adnexa to better advantage. Color and duplex Doppler ultrasound was utilized to evaluate blood flow to the ovaries.  COMPARISON:  Current abdomen and pelvis CT  FINDINGS: Uterus  Measurements: 8.2 x 3.5 x 3.9 cm. No fibroids or other mass visualized.  Endometrium   Thickness: 8.5 mm.  No focal abnormality visualized.  Right ovary  Measurements: 3.1 x 2.3 x 2.4 cm. Normal appearance/no adnexal mass.  Left ovary  Measurements: 2.6 x 2.0 x 2.4 cm. Dominant 16 mm follicular cyst. Ovary otherwise unremarkable.  Pulsed Doppler evaluation of both ovaries demonstrates normal low-resistance arterial and venous waveforms. Blood flow visualization the ovaries was somewhat limited by this patient's body habitus. Also limited by patient's extreme pain.  Other findings  No free fluid.  IMPRESSION: Normal exam.   Electronically Signed   By: Lajean Manes M.D.   On: 12/16/2014 19:44   US Pelvis Limited  12/16/2014   CLINICAL DATA:  Right lower quadrant abdominal pain for 3 days.  EXAM: TRANSABDOMINAL AND TRANSVAGINAL ULTRASOUND OF PELVIS  DOPPLER ULTRASOUND OF OVARIES  TECHNIQUE: Both transabdominal and transvaginal ultrasound examinations of the pelvis were performed. Transabdominal technique was performed for global imaging of the pelvis including uterus, ovaries, adnexal regions, and pelvic cul-de-sac.  It was necessary to proceed with endovaginal exam following the transabdominal exam to visualize the uterus, ovaries and adnexa to better advantage. Color and duplex Doppler ultrasound was utilized to evaluate blood flow to the ovaries.  COMPARISON:  Current abdomen and pelvis CT  FINDINGS: Uterus  Measurements: 8.2 x 3.5 x 3.9 cm. No fibroids or other mass visualized.  Endometrium  Thickness: 8.5 mm.  No focal abnormality visualized.  Right ovary  Measurements: 3.1 x 2.3 x 2.4 cm. Normal appearance/no adnexal mass.  Left ovary  Measurements: 2.6 x 2.0 x 2.4 cm. Dominant 16 mm follicular cyst. Ovary otherwise unremarkable.  Pulsed Doppler evaluation of both ovaries demonstrates normal low-resistance arterial and venous waveforms. Blood flow visualization the ovaries was somewhat limited by this patient's body habitus. Also limited by patient's extreme pain.  Other findings  No free  fluid.  IMPRESSION: Normal exam.   Electronically Signed   By: Lajean Manes M.D.   On: 12/16/2014 19:44   Ct Abdomen Pelvis W Contrast  12/16/2014   CLINICAL DATA:  Right lower quadrant pain beginning 3 days ago with pain extending into the groin and right thigh. History of an oophorectomy after birth of her child.  EXAM: CT ABDOMEN AND PELVIS WITH CONTRAST  TECHNIQUE: Multidetector CT imaging of the abdomen and pelvis was performed using the standard protocol following bolus administration of intravenous contrast.  CONTRAST:  163mL OMNIPAQUE IOHEXOL 300 MG/ML  SOLN  COMPARISON:  11/28/2013  FINDINGS: Appendix: Normal.  Gastrointestinal:  Stomach, small bowel and colon are unremarkable.  Uterus and adnexa:  Unremarkable.  Lung bases:  Clear.  Heart normal in size.  Liver: 1 cm low-density lesion at the base of the caudate lobe, nonspecific likely an area of focal fat. Liver otherwise unremarkable.  Spleen, gallbladder, pancreas, adrenal glands:  Unremarkable.  Bilateral nonobstructing intrarenal stones. There is no hydronephrosis. Ureters are normal course and in caliber with no stones. Subtle low-density lesion, approximate 12 mm in size, along the posterior medial midpole the left kidney, likely a cyst. This was present previously. No other renal masses or lesions. Bladder is unremarkable.  Lymph nodes:  No pathologically enlarged lymph nodes.  Ascites: None.  Musculoskeletal: Minor degenerative changes of the lower thoracic and upper lumbar spine. No other abnormality.  IMPRESSION: 1. No acute findings. No findings to explain this patient's symptoms of right lower quadrant to right groin pain. 2. Normal appendix visualized. 3. Small bilateral nonobstructing intrarenal stones. No ureteral stone or obstructive uropathy.   Electronically Signed   By: Lajean Manes M.D.   On: 12/16/2014 17:46   [place] Art/ven Flow Abd Pelv Doppler  12/16/2014   CLINICAL DATA:  Right lower quadrant abdominal pain for 3 days.  EXAM:  TRANSABDOMINAL AND TRANSVAGINAL ULTRASOUND OF PELVIS  DOPPLER ULTRASOUND OF OVARIES  TECHNIQUE: Both transabdominal and transvaginal ultrasound examinations of the pelvis were performed. Transabdominal technique was performed for global imaging of the pelvis including uterus, ovaries, adnexal regions, and pelvic cul-de-sac.  It was necessary to proceed with endovaginal exam following the transabdominal exam to visualize the uterus, ovaries and adnexa to better advantage. Color and duplex Doppler ultrasound was utilized to evaluate blood flow to the ovaries.  COMPARISON:  Current abdomen and pelvis CT  FINDINGS: Uterus  Measurements: 8.2 x 3.5 x 3.9 cm. No fibroids or other mass visualized.  Endometrium  Thickness: 8.5 mm.  No focal abnormality visualized.  Right ovary  Measurements: 3.1 x 2.3 x 2.4 cm. Normal appearance/no adnexal mass.  Left ovary  Measurements: 2.6 x 2.0 x 2.4 cm. Dominant 16 mm follicular cyst. Ovary otherwise unremarkable.  Pulsed Doppler evaluation of both ovaries demonstrates normal low-resistance arterial and venous waveforms. Blood flow visualization the ovaries was somewhat limited by this patient's body habitus. Also limited by patient's extreme pain.  Other findings  No free fluid.  IMPRESSION: Normal exam.   Electronically Signed   By: Lajean Manes M.D.   On: 12/16/2014 19:44   I, Tyre Beaver R., personally reviewed and evaluated these images and lab results as part of my medical decision-making.   EKG Interpretation None      MDM   Final diagnoses:  RLQ abdominal pain  UTI (lower urinary tract infection)    Patient with abdominal pain. CT and ultrasound reassuring. Urine shows possible UTI. Patient feels better after some ketamine, but states she never wants his medicine away because it made her feel as if she was blind. Will discharge home to follow-up as needed.    Davonna Belling, MD 12/17/14 [phone]

## 2014-12-16 NOTE — Discharge Instructions (Signed)
Abdominal Pain °Many things can cause abdominal pain. Usually, abdominal pain is not caused by a disease and will improve without treatment. It can often be observed and treated at home. Your health care provider will do a physical exam and possibly order blood tests and X-rays to help determine the seriousness of your pain. However, in many cases, more time must pass before a clear cause of the pain can be found. Before that point, your health care provider may not know if you need more testing or further treatment. °HOME CARE INSTRUCTIONS  °Monitor your abdominal pain for any changes. The following actions may help to alleviate any discomfort you are experiencing: °· Only take over-the-counter or prescription medicines as directed by your health care provider. °· Do not take laxatives unless directed to do so by your health care provider. °· Try a clear liquid diet (broth, tea, or water) as directed by your health care provider. Slowly move to a bland diet as tolerated. °SEEK MEDICAL CARE IF: °· You have unexplained abdominal pain. °· You have abdominal pain associated with nausea or diarrhea. °· You have pain when you urinate or have a bowel movement. °· You experience abdominal pain that wakes you in the night. °· You have abdominal pain that is worsened or improved by eating food. °· You have abdominal pain that is worsened with eating fatty foods. °· You have a fever. °SEEK IMMEDIATE MEDICAL CARE IF:  °· Your pain does not go away within 2 hours. °· You keep throwing up (vomiting). °· Your pain is felt only in portions of the abdomen, such as the right side or the left lower portion of the abdomen. °· You pass bloody or black tarry stools. °MAKE SURE YOU: °· Understand these instructions.   °· Will watch your condition.   °· Will get help right away if you are not doing well or get worse.   °Document Released: 01/27/2005 Document Revised: 04/24/2013 Document Reviewed: 12/27/2012 °ExitCare® Patient Information  ©2015 [employer]. This information is not intended to replace advice given to you by your health care provider. Make sure you discuss any questions you have with your health care provider. °Urinary Tract Infection °Urinary tract infections (UTIs) can develop anywhere along your urinary tract. Your urinary tract is your body's drainage system for removing wastes and extra water. Your urinary tract includes two kidneys, two ureters, a bladder, and a urethra. Your kidneys are a pair of bean-shaped organs. Each kidney is about the size of your fist. They are located below your ribs, one on each side of your spine. °CAUSES °Infections are caused by microbes, which are microscopic organisms, including fungi, viruses, and bacteria. These organisms are so small that they can only be seen through a microscope. Bacteria are the microbes that most commonly cause UTIs. °SYMPTOMS  °Symptoms of UTIs may vary by age and gender of the patient and by the location of the infection. Symptoms in young women typically include a frequent and intense urge to urinate and a painful, burning feeling in the bladder or urethra during urination. Older women and men are more likely to be tired, shaky, and weak and have muscle aches and abdominal pain. A fever may mean the infection is in your kidneys. Other symptoms of a kidney infection include pain in your back or sides below the ribs, nausea, and vomiting. °DIAGNOSIS °To diagnose a UTI, your caregiver will ask you about your symptoms. Your caregiver also will ask to provide a urine sample. The urine sample   will be tested for bacteria and white blood cells. White blood cells are made by your body to help fight infection. °TREATMENT  °Typically, UTIs can be treated with medication. Because most UTIs are caused by a bacterial infection, they usually can be treated with the use of antibiotics. The choice of antibiotic and length of treatment depend on your symptoms and the type of bacteria  causing your infection. °HOME CARE INSTRUCTIONS °· If you were prescribed antibiotics, take them exactly as your caregiver instructs you. Finish the medication even if you feel better after you have only taken some of the medication. °· Drink enough water and fluids to keep your urine clear or pale yellow. °· Avoid caffeine, tea, and carbonated beverages. They tend to irritate your bladder. °· Empty your bladder often. Avoid holding urine for long periods of time. °· Empty your bladder before and after sexual intercourse. °· After a bowel movement, women should cleanse from front to back. Use each tissue only once. °SEEK MEDICAL CARE IF:  °· You have back pain. °· You develop a fever. °· Your symptoms do not begin to resolve within 3 days. °SEEK IMMEDIATE MEDICAL CARE IF:  °· You have severe back pain or lower abdominal pain. °· You develop chills. °· You have nausea or vomiting. °· You have continued burning or discomfort with urination. °MAKE SURE YOU:  °· Understand these instructions. °· Will watch your condition. °· Will get help right away if you are not doing well or get worse. °Document Released: 01/27/2005 Document Revised: 10/19/2011 Document Reviewed: 05/28/2011 °ExitCare® Patient Information ©2015 [employer]. This information is not intended to replace advice given to you by your health care provider. Make sure you discuss any questions you have with your health care provider. ° °

## 2015-01-04 ENCOUNTER — Emergency Department (HOSPITAL_COMMUNITY): Payer: Medicaid Other

## 2015-01-04 ENCOUNTER — Encounter (HOSPITAL_COMMUNITY): Payer: Self-pay | Admitting: Emergency Medicine

## 2015-01-04 ENCOUNTER — Emergency Department (HOSPITAL_COMMUNITY)
Admission: EM | Admit: 2015-01-04 | Discharge: 2015-01-04 | Disposition: A | Discharge status: Home / Self Care | Payer: Medicaid Other | Attending: Emergency Medicine | Admitting: Emergency Medicine

## 2015-01-04 DIAGNOSIS — Z8669 Personal history of other diseases of the nervous system and sense organs: Secondary | ICD-10-CM | POA: Insufficient documentation

## 2015-01-04 DIAGNOSIS — F419 Anxiety disorder, unspecified: Secondary | ICD-10-CM | POA: Diagnosis not present

## 2015-01-04 DIAGNOSIS — K219 Gastro-esophageal reflux disease without esophagitis: Secondary | ICD-10-CM | POA: Diagnosis not present

## 2015-01-04 DIAGNOSIS — E669 Obesity, unspecified: Secondary | ICD-10-CM | POA: Insufficient documentation

## 2015-01-04 DIAGNOSIS — Z720 Tobacco use: Secondary | ICD-10-CM | POA: Diagnosis not present

## 2015-01-04 DIAGNOSIS — Z9851 Tubal ligation status: Secondary | ICD-10-CM | POA: Insufficient documentation

## 2015-01-04 DIAGNOSIS — F1190 Opioid use, unspecified, uncomplicated: Secondary | ICD-10-CM | POA: Insufficient documentation

## 2015-01-04 DIAGNOSIS — Z7951 Long term (current) use of inhaled steroids: Secondary | ICD-10-CM | POA: Diagnosis not present

## 2015-01-04 DIAGNOSIS — Z3202 Encounter for pregnancy test, result negative: Secondary | ICD-10-CM | POA: Insufficient documentation

## 2015-01-04 DIAGNOSIS — K5901 Slow transit constipation: Secondary | ICD-10-CM | POA: Insufficient documentation

## 2015-01-04 DIAGNOSIS — Z79899 Other long term (current) drug therapy: Secondary | ICD-10-CM | POA: Insufficient documentation

## 2015-01-04 DIAGNOSIS — K529 Noninfective gastroenteritis and colitis, unspecified: Secondary | ICD-10-CM | POA: Diagnosis not present

## 2015-01-04 DIAGNOSIS — R1032 Left lower quadrant pain: Secondary | ICD-10-CM

## 2015-01-04 DIAGNOSIS — Z8739 Personal history of other diseases of the musculoskeletal system and connective tissue: Secondary | ICD-10-CM | POA: Diagnosis not present

## 2015-01-04 DIAGNOSIS — J45909 Unspecified asthma, uncomplicated: Secondary | ICD-10-CM | POA: Diagnosis not present

## 2015-01-04 DIAGNOSIS — Z8781 Personal history of (healed) traumatic fracture: Secondary | ICD-10-CM | POA: Diagnosis not present

## 2015-01-04 LAB — URINALYSIS, ROUTINE W REFLEX MICROSCOPIC
BILIRUBIN URINE: NEGATIVE
GLUCOSE, UA: NEGATIVE mg/dL
Ketones, ur: NEGATIVE mg/dL
Nitrite: NEGATIVE
Protein, ur: NEGATIVE mg/dL
SPECIFIC GRAVITY, URINE: 1.013 (ref 1.005–1.030)
UROBILINOGEN UA: 0.2 mg/dL (ref 0.0–1.0)
pH: 7.5 (ref 5.0–8.0)

## 2015-01-04 LAB — COMPREHENSIVE METABOLIC PANEL
ALK PHOS: 78 U/L (ref 38–126)
ALT: 12 U/L — AB (ref 14–54)
AST: 14 U/L — ABNORMAL LOW (ref 15–41)
Albumin: 3.7 g/dL (ref 3.5–5.0)
Anion gap: 8 (ref 5–15)
BUN: 8 mg/dL (ref 6–20)
CALCIUM: 9 mg/dL (ref 8.9–10.3)
CO2: 26 mmol/L (ref 22–32)
CREATININE: 0.62 mg/dL (ref 0.44–1.00)
Chloride: 103 mmol/L (ref 101–111)
Glucose, Bld: 94 mg/dL (ref 65–99)
Potassium: 4.3 mmol/L (ref 3.5–5.1)
Sodium: 137 mmol/L (ref 135–145)
Total Bilirubin: 0.3 mg/dL (ref 0.3–1.2)
Total Protein: 7.6 g/dL (ref 6.5–8.1)

## 2015-01-04 LAB — CBC
HCT: 40.9 % (ref 36.0–46.0)
Hemoglobin: 13.2 g/dL (ref 12.0–15.0)
MCH: 27.5 pg (ref 26.0–34.0)
MCHC: 32.3 g/dL (ref 30.0–36.0)
MCV: 85.2 fL (ref 78.0–100.0)
PLATELETS: 380 10*3/uL (ref 150–400)
RBC: 4.8 MIL/uL (ref 3.87–5.11)
RDW: 14.9 % (ref 11.5–15.5)
WBC: 8.6 10*3/uL (ref 4.0–10.5)

## 2015-01-04 LAB — LIPASE, BLOOD: Lipase: 12 U/L — ABNORMAL LOW (ref 22–51)

## 2015-01-04 LAB — I-STAT CG4 LACTIC ACID, ED: LACTIC ACID, VENOUS: 0.87 mmol/L (ref 0.5–2.0)

## 2015-01-04 LAB — URINE MICROSCOPIC-ADD ON

## 2015-01-04 LAB — I-STAT BETA HCG BLOOD, ED (MC, WL, AP ONLY)

## 2015-01-04 MED ORDER — LUBIPROSTONE 24 MCG PO CAPS: 24.0000 ug | ORAL_CAPSULE | Freq: Two times a day (BID) | ORAL | Status: DC

## 2015-01-04 MED ORDER — KETOROLAC TROMETHAMINE 30 MG/ML IJ SOLN
30.0000 mg | Freq: Once | INTRAMUSCULAR | Status: AC
Start: 2015-01-04 — End: 2015-01-04
  Administered 2015-01-04: 30 mg via INTRAVENOUS
  Filled 2015-01-04: qty 1

## 2015-01-04 MED ORDER — HYDROMORPHONE HCL 1 MG/ML IJ SOLN
1.0000 mg | Freq: Once | INTRAMUSCULAR | Status: AC
  Administered 2015-01-04: 1 mg via INTRAVENOUS
  Filled 2015-01-04: qty 1

## 2015-01-04 MED ORDER — DICYCLOMINE HCL 10 MG PO CAPS
10.0000 mg | ORAL_CAPSULE | Freq: Once | ORAL | Status: DC
  Filled 2015-01-04: qty 1

## 2015-01-04 NOTE — ED Notes (Signed)
Attempted IV start, unsuccessful.  Pt is a difficult stick, order placed IV team consult and blood draw

## 2015-01-04 NOTE — Discharge Instructions (Signed)
1. Medications: Home zofran, home percocet, amitiza, usual home medications 2. Treatment: rest, drink plenty of fluids, advance diet slowly 3. Follow Up: Please followup with your primary doctor in 2 days for discussion of your diagnoses and further evaluation after today's visit; if you do not have a primary care doctor use the resource guide provided to find one; Please return to the ER for persistent vomiting, high fevers or worsening symptoms   Colitis Colitis is inflammation of the colon. Colitis can be a short-term or long-standing (chronic) illness. Crohn's disease and ulcerative colitis are 2 types of colitis which are chronic. They usually require lifelong treatment. CAUSES  There are many different causes of colitis, including:  Viruses.  Germs (bacteria).  Medicine reactions. SYMPTOMS   Diarrhea.  Intestinal bleeding.  Pain.  Fever.  Throwing up (vomiting).  Tiredness (fatigue).  Weight loss.  Bowel blockage. DIAGNOSIS  The diagnosis of colitis is based on examination and stool or blood tests. X-rays, CT scan, and colonoscopy may also be needed. TREATMENT  Treatment may include:  Fluids given through the vein (intravenously).  Bowel rest (nothing to eat or drink for a period of time).  Medicine for pain and diarrhea.  Medicines (antibiotics) that kill germs.  Cortisone medicines.  Surgery. HOME CARE INSTRUCTIONS   Get plenty of rest.  Drink enough water and fluids to keep your urine clear or pale yellow.  Eat a well-balanced diet.  Call your caregiver for follow-up as recommended. SEEK IMMEDIATE MEDICAL CARE IF:   You develop chills.  You have an oral temperature above 102 F (38.9 C), not controlled by medicine.  You have extreme weakness, fainting, or dehydration.  You have repeated vomiting.  You develop severe belly (abdominal) pain or are passing bloody or tarry stools. MAKE SURE YOU:   Understand these instructions.  Will watch  your condition.  Will get help right away if you are not doing well or get worse. Document Released: 05/27/2004 Document Revised: 07/12/2011 Document Reviewed: 08/22/2009 [facility] Patient Information 2015 [place], [place]. This information is not intended to replace advice given to you by your health care provider. Make sure you discuss any questions you have with your health care provider.

## 2015-01-04 NOTE — ED Notes (Signed)
Nurse contacting IV team abt drawing labs

## 2015-01-04 NOTE — ED Provider Notes (Signed)
CSN: [id number]     Arrival date & time 01/04/15  1203 History   First MD Initiated Contact with Patient 01/04/15 1235     Chief Complaint  Patient presents with  . Abdominal Pain  . Nausea     (Consider location/radiation/quality/duration/timing/severity/associated sxs/prior Treatment) The history is provided by the patient and medical records. No language interpreter was used.    Cindy Hoffman is a 39 y.o. female  with a hx of chronic back pain (in pain management) presents to the Emergency Department complaining of gradual, persistent, progressively worsening LLQ abd pain onset 2-3 days ago.  Pt reports intermittent episodes of diarrhea and constipation for several months.  She reports she was constipated last week and took several doses of Miralax 2 days ago with yesterday with 2 small, loose bowel movements.  NO melena or hematochezia.  Pain then worsened this AM around 4:30 after arriving home. Associated symptoms include nausea without vomiting.  Pt denies treatments PTA, but did take her regular oxycodone (5-325 q 8 hours) last night at midnight. Pr reports she began taking oxycodone regularly about 6 mos ago.  Pt reports pt was not taking any stool softner since the onset of the opiates.  Nothing makes it better and movement and laying on the left side makes it worse.  Pt denies fever, chills, headache, neck pain, chest pain, SOB, vomiting, weakness, dizziness, syncope.  Pt reports hx of BTL 5 years ago; no other surgeries.  Denies vaginal discharge.      Past Medical History  Diagnosis Date  . Asthma   . Migraine   . Obesity   . Thyroid disease   . Fibromyalgia   . Anxiety   . GERD (gastroesophageal reflux disease)   . Fracture of right foot   . Anxiety   . Complication of anesthesia     "STATES WAKES UP AND CANT BREATHE AND NEEDS BREATHING TREATMENT  IN PACU"  . Dysrhythmia     palpitations- "from thyroid"  . Sleep apnea      NO CPAP--could not tolerates mask  . Viral  respiratory infection 06/26/13    ? influenza   Past Surgical History  Procedure Laterality Date  . Tubal ligation    . Nasal septum surgery    . Wisdom tooth extraction    . Thyroid lobectomy Right 07/19/2013    Procedure: THYROID LOBECTOMY;  Surgeon: Harl Bowie, MD;  Location: [facility];  Service: General;  Laterality: Right;   Family History  Problem Relation Age of Onset  . Hypertension Mother   . Hypertension Father   . Diabetes Sister   . Cancer Paternal Grandmother     breast   Social History  Substance Use Topics  . Smoking status: Light Tobacco Smoker -- 0.25 packs/day for 20 years    Types: Cigarettes  . Smokeless tobacco: Never Used  . Alcohol Use: No     Comment: socially   OB History    No data available     Review of Systems  Constitutional: Negative for fever, diaphoresis, appetite change, fatigue and unexpected weight change.  HENT: Negative for mouth sores.   Eyes: Negative for visual disturbance.  Respiratory: Negative for cough, chest tightness, shortness of breath and wheezing.   Cardiovascular: Negative for chest pain.  Gastrointestinal: Positive for nausea, abdominal pain and constipation. Negative for vomiting and diarrhea.  Endocrine: Negative for polydipsia, polyphagia and polyuria.  Genitourinary: Negative for dysuria, urgency, frequency and hematuria.  Musculoskeletal: Negative for  back pain and neck stiffness.  Skin: Negative for rash.  Allergic/Immunologic: Negative for immunocompromised state.  Neurological: Negative for syncope, light-headedness and headaches.  Hematological: Does not bruise/bleed easily.  Psychiatric/Behavioral: Negative for sleep disturbance. The patient is not nervous/anxious.       Allergies  Beta adrenergic blockers  Home Medications   Prior to Admission medications   Medication Sig Start Date End Date Taking? Authorizing Provider  albuterol (PROVENTIL HFA;VENTOLIN HFA) 108 (90 BASE) MCG/ACT inhaler  Inhale 1 puff into the lungs every 6 (six) hours as needed for wheezing or shortness of breath.   Yes Historical Provider, MD  furosemide (LASIX) 20 MG tablet Take 2 tablets (40 mg total) by mouth daily. Patient taking differently: Take 40 mg by mouth daily as needed for fluid.  10/03/14  Yes Etta Quill, NP  LORazepam (ATIVAN) 1 MG tablet Take 1 mg by mouth every 8 (eight) hours as needed for anxiety.   Yes Historical Provider, MD  modafinil (PROVIGIL) 200 MG tablet Take 100 mg by mouth daily as needed (stay awake).    Yes Historical Provider, MD  mometasone-formoterol (DULERA) 200-5 MCG/ACT AERO Inhale 2 puffs into the lungs 2 (two) times daily.   Yes Historical Provider, MD  ondansetron (ZOFRAN-ODT) 4 MG disintegrating tablet Take 4 mg by mouth every 8 (eight) hours as needed. Nausea 10/10/14  Yes Historical Provider, MD  oxyCODONE-acetaminophen (PERCOCET/ROXICET) 5-325 MG per tablet Take 1-2 tablets by mouth every 6 (six) hours as needed for severe pain. Patient taking differently: Take 1-2 tablets by mouth 2 (two) times daily.  04/27/14  Yes Alfonzo Beers, MD  pantoprazole (PROTONIX) 40 MG tablet Take 40 mg by mouth daily.   Yes Historical Provider, MD  polyethylene glycol (MIRALAX / GLYCOLAX) packet Take 17 g by mouth daily as needed for moderate constipation.   Yes Historical Provider, MD  zolpidem (AMBIEN) 10 MG tablet Take 10 mg by mouth at bedtime as needed for sleep.   Yes Historical Provider, MD  lubiprostone (AMITIZA) 24 MCG capsule Take 1 capsule (24 mcg total) by mouth 2 (two) times daily with a meal. 01/04/15   Ilai Hiller, PA-C  phenazopyridine (PYRIDIUM) 200 MG tablet Take 1 tablet (200 mg total) by mouth 3 (three) times daily. Patient not taking: Reported on 01/04/2015 12/16/14   Davonna Belling, MD   BP 120/60 mmHg  Pulse 62  Temp(Src) 98.1 F (36.7 C) (Oral)  Resp 18  SpO2 96%  LMP 01/02/2015 Physical Exam  Constitutional: She appears well-developed and well-nourished. No  distress.  Awake, alert, nontoxic appearance  HENT:  Head: Normocephalic and atraumatic.  Mouth/Throat: Oropharynx is clear and moist. No oropharyngeal exudate.  Eyes: Conjunctivae are normal. No scleral icterus.  Neck: Normal range of motion. Neck supple.  Cardiovascular: Normal rate, regular rhythm and intact distal pulses.   Pulmonary/Chest: Effort normal and breath sounds normal. No respiratory distress. She has no wheezes.  Equal chest expansion  Abdominal: Soft. Bowel sounds are normal. She exhibits no distension and no mass. There is tenderness in the left lower quadrant. There is guarding. There is no rebound and no CVA tenderness.    Left lower quadrant abdominal pain with tenderness to palpation and guarding on exam No CVA tenderness  Musculoskeletal: Normal range of motion. She exhibits no edema.  Neurological: She is alert.  Speech is clear and goal oriented Moves extremities without ataxia  Skin: Skin is warm and dry. She is not diaphoretic.  Psychiatric: She has a normal mood and  affect.  Nursing note and vitals reviewed.   ED Course  Procedures (including critical care time) Labs Review Labs Reviewed  LIPASE, BLOOD - Abnormal; Notable for the following:    Lipase 12 (*)    All other components within normal limits  COMPREHENSIVE METABOLIC PANEL - Abnormal; Notable for the following:    AST 14 (*)    ALT 12 (*)    All other components within normal limits  URINALYSIS, ROUTINE W REFLEX MICROSCOPIC (NOT AT [facility]) - Abnormal; Notable for the following:    Hgb urine dipstick TRACE (*)    Leukocytes, UA SMALL (*)    All other components within normal limits  URINE MICROSCOPIC-ADD ON - Abnormal; Notable for the following:    Squamous Epithelial / LPF FEW (*)    Bacteria, UA FEW (*)    All other components within normal limits  CBC  URINALYSIS, ROUTINE W REFLEX MICROSCOPIC (NOT AT [facility])  I-STAT BETA HCG BLOOD, ED ([facility], [facility], [facility] ONLY)  I-STAT CG4 LACTIC ACID, ED     Imaging Review Dg Abd Acute W/chest  01/04/2015   CLINICAL DATA:  Right lower quadrant abdominal pain for the past 2 days.  EXAM: DG ABDOMEN ACUTE W/ 1V CHEST  COMPARISON:  Chest dated 04/27/2014, pelvic ultrasound dated 12/16/2014 and abdomen and pelvis CT dated 12/16/2014.  FINDINGS: Normal sized heart. Clear lungs. Diffuse peribronchial thickening. Normal bowel gas pattern without free peritoneal air. Small bilateral pelvic phleboliths. The previously seen small renal calculi are not radiographically visible. Mild lower thoracic spine degenerative changes.  IMPRESSION: No acute abnormality.  Stable chronic bronchitic changes.   Electronically Signed   By: Claudie Revering M.D.   On: 01/04/2015 14:24   Ct Renal Stone Study  01/04/2015   CLINICAL DATA:  39 year old female with right lower abdominal pain for the past 3 days.  EXAM: CT ABDOMEN AND PELVIS WITHOUT CONTRAST  TECHNIQUE: Multidetector CT imaging of the abdomen and pelvis was performed following the standard protocol without IV contrast.  COMPARISON:  Acute abdominal series obtained earlier today 01/04/2015; prior CT abdomen/ pelvis 12/16/2014  FINDINGS: Lower Chest: Mild dependent atelectasis at in the lateral right lower lobe. Otherwise, the visualized lungs are clear. Visualized cardiac structures within normal limits for size. Unremarkable distal thoracic esophagus.  Abdomen: Unenhanced CT was performed per clinician order. Lack of IV contrast limits sensitivity and specificity, especially for evaluation of abdominal/pelvic solid viscera. Within these limitations, unremarkable CT appearance of the stomach, duodenum, spleen, pancreas and adrenal glands. Normal hepatic contour and morphology. No discrete hepatic lesion. Gallbladder is unremarkable. No intra or extrahepatic biliary ductal dilatation. No evidence of hydronephrosis. There are small nonobstructing renal stones bilaterally. The largest individual stone measures almost 4 mm in the  interpolar right kidney.  No evidence of ureteral stone. Very small region of inflammatory stranding in the pericolonic fat anterior to the descending colon which is new compared to the relatively recent prior imaging. There is no definite associated colonic wall thickening. No evidence of bowel obstruction. Normal appendix. No free fluid or suspicious adenopathy.  Pelvis: Unremarkable bladder, uterus and adnexa. Trace free fluid is likely physiologic. No suspicious adenopathy.  Bones/Soft Tissues: No acute fracture or aggressive appearing lytic or blastic osseous lesion.  Vascular: Limited evaluation in the absence of IV contrast. No significant atherosclerotic plaque or aneurysm.  IMPRESSION: 1. Small region of inflammatory stranding in the pericolonic fat at anterior to the descending colon which is new compared to 12/16/2014. There is no  associated colonic wall thickening. These findings are of uncertain clinical significance but may reflect an early nonspecific colitis. 2. Bilateral nonobstructing nephrolithiasis.   Electronically Signed   By: Jacqulynn Cadet M.D.   On: 01/04/2015 14:35   I have personally reviewed and evaluated these images and lab results as part of my medical decision-making.   EKG Interpretation None      MDM   Final diagnoses:  LLQ abdominal pain  Colitis  Slow transit constipation  Chronic narcotic use   Cindy Hoffman presents with left lower quadrant abdominal pain and complete the constipation. Patient with likely narcotic constipation based on history however with guarding in the left lower quadrant and known nephrolithiasis will obtain CT renal to assess.  3:25 PM Labs reassuring without leukocytosis. Normal lipase. Urinalysis without evidence of urinary tract infection. Acute abd without free air or evidence of SBO.  CT with mild colitis.    4:15 PM Patient reassessed. She is feeling some better.  Lactic acid is normal. No evidence of ischemic colitis.  Patient's colitis is likely viral in nature. Discussed this with her as well as the risk and benefit of antibiotics.  At this time I do not believe antibiotics are indicated. Patient is in agreement. Discussed reasons to return to the emergency department including systemic symptoms of fever, vomiting, worsening pain or other concerns. Patient will be discharged home on amitiza for her narcotic constipation.  BP 120/60 mmHg  Pulse 62  Temp(Src) 98.1 F (36.7 C) (Oral)  Resp 18  SpO2 96%  LMP 01/02/2015   Abigail Butts, PA-C 01/04/15 1616  Debby Freiberg, MD 01/05/15 [phone]

## 2015-01-04 NOTE — ED Notes (Signed)
Pt complaining of sharp pain in left side x 2 days. Tender to touch, states she was constipated earlier this week but took laxatives and had a successful BM yesterday. Denies vomiting/fever, but has felt very nauseous since this started. Has been taking Zofran to help at home.

## 2015-01-04 NOTE — ED Notes (Signed)
Patient transported to X-ray 

## 2015-03-13 ENCOUNTER — Other Ambulatory Visit: Payer: Self-pay | Admitting: Otolaryngology

## 2015-03-13 DIAGNOSIS — H9121 Sudden idiopathic hearing loss, right ear: Secondary | ICD-10-CM

## 2015-03-31 ENCOUNTER — Ambulatory Visit
Admission: RE | Admit: 2015-03-31 | Discharge: 2015-03-31 | Disposition: A | Discharge status: Home / Self Care | Payer: Medicaid Other | Source: Ambulatory Visit | Attending: Otolaryngology | Admitting: Otolaryngology

## 2015-03-31 DIAGNOSIS — H9121 Sudden idiopathic hearing loss, right ear: Secondary | ICD-10-CM

## 2015-03-31 MED ORDER — GADOBENATE DIMEGLUMINE 529 MG/ML IV SOLN
20.0000 mL | Freq: Once | INTRAVENOUS | Status: AC | PRN
  Administered 2015-03-31: 20 mL via INTRAVENOUS

## 2015-06-21 ENCOUNTER — Emergency Department (HOSPITAL_COMMUNITY)
Admission: EM | Admit: 2015-06-21 | Discharge: 2015-06-21 | Disposition: A | Discharge status: Home / Self Care | Payer: Medicaid Other | Attending: Emergency Medicine | Admitting: Emergency Medicine

## 2015-06-21 ENCOUNTER — Emergency Department (HOSPITAL_COMMUNITY): Payer: Medicaid Other

## 2015-06-21 ENCOUNTER — Encounter (HOSPITAL_COMMUNITY): Payer: Self-pay | Admitting: Emergency Medicine

## 2015-06-21 DIAGNOSIS — Z7951 Long term (current) use of inhaled steroids: Secondary | ICD-10-CM | POA: Insufficient documentation

## 2015-06-21 DIAGNOSIS — R42 Dizziness and giddiness: Secondary | ICD-10-CM | POA: Diagnosis not present

## 2015-06-21 DIAGNOSIS — R110 Nausea: Secondary | ICD-10-CM | POA: Insufficient documentation

## 2015-06-21 DIAGNOSIS — Z8679 Personal history of other diseases of the circulatory system: Secondary | ICD-10-CM | POA: Insufficient documentation

## 2015-06-21 DIAGNOSIS — K219 Gastro-esophageal reflux disease without esophagitis: Secondary | ICD-10-CM | POA: Diagnosis not present

## 2015-06-21 DIAGNOSIS — E669 Obesity, unspecified: Secondary | ICD-10-CM | POA: Insufficient documentation

## 2015-06-21 DIAGNOSIS — F419 Anxiety disorder, unspecified: Secondary | ICD-10-CM | POA: Insufficient documentation

## 2015-06-21 DIAGNOSIS — Z8669 Personal history of other diseases of the nervous system and sense organs: Secondary | ICD-10-CM | POA: Insufficient documentation

## 2015-06-21 DIAGNOSIS — F17210 Nicotine dependence, cigarettes, uncomplicated: Secondary | ICD-10-CM | POA: Insufficient documentation

## 2015-06-21 DIAGNOSIS — R0602 Shortness of breath: Secondary | ICD-10-CM | POA: Diagnosis present

## 2015-06-21 DIAGNOSIS — Z8739 Personal history of other diseases of the musculoskeletal system and connective tissue: Secondary | ICD-10-CM | POA: Diagnosis not present

## 2015-06-21 DIAGNOSIS — J45901 Unspecified asthma with (acute) exacerbation: Secondary | ICD-10-CM | POA: Insufficient documentation

## 2015-06-21 DIAGNOSIS — Z8781 Personal history of (healed) traumatic fracture: Secondary | ICD-10-CM | POA: Insufficient documentation

## 2015-06-21 DIAGNOSIS — Z8619 Personal history of other infectious and parasitic diseases: Secondary | ICD-10-CM | POA: Insufficient documentation

## 2015-06-21 MED ORDER — PREDNISONE 20 MG PO TABS: 60.0000 mg | ORAL_TABLET | Freq: Every day | ORAL | Status: DC

## 2015-06-21 MED ORDER — ALBUTEROL SULFATE (2.5 MG/3ML) 0.083% IN NEBU
5.0000 mg | INHALATION_SOLUTION | Freq: Once | RESPIRATORY_TRACT | Status: AC
  Administered 2015-06-21: 5 mg via RESPIRATORY_TRACT
  Filled 2015-06-21: qty 6

## 2015-06-21 MED ORDER — ALBUTEROL (5 MG/ML) CONTINUOUS INHALATION SOLN
10.0000 mg/h | INHALATION_SOLUTION | Freq: Once | RESPIRATORY_TRACT | Status: AC
  Administered 2015-06-21: 10 mg/h via RESPIRATORY_TRACT
  Filled 2015-06-21: qty 20

## 2015-06-21 MED ORDER — MAGNESIUM SULFATE 2 GM/50ML IV SOLN
2.0000 g | Freq: Once | INTRAVENOUS | Status: AC
  Administered 2015-06-21: 2 g via INTRAVENOUS
  Filled 2015-06-21: qty 50

## 2015-06-21 MED ORDER — IPRATROPIUM BROMIDE 0.02 % IN SOLN
0.5000 mg | Freq: Once | RESPIRATORY_TRACT | Status: AC
  Administered 2015-06-21: 0.5 mg via RESPIRATORY_TRACT
  Filled 2015-06-21: qty 2.5

## 2015-06-21 MED ORDER — METHYLPREDNISOLONE SODIUM SUCC 125 MG IJ SOLR
125.0000 mg | Freq: Once | INTRAMUSCULAR | Status: AC
  Administered 2015-06-21: 125 mg via INTRAVENOUS
  Filled 2015-06-21: qty 2

## 2015-06-21 NOTE — ED Notes (Signed)
Ambulated patient o2 sat 95% RA.

## 2015-06-21 NOTE — Discharge Instructions (Signed)

## 2015-06-21 NOTE — ED Provider Notes (Signed)
Patient signed out at the end of shift by Jeannett Senior, PA-C. Patient is an asthmatic with history of admissions, no intubations, who continues to smoke and work in a smoking environment. She presents with wheezing that started yesterday without fever, and persisted today despite multiple nebulizer treatments and 60 mg prednisone today. She received IV magnesium, solumedrol, and multiple nebulizers here including one CAT.   She has minimal wheezing on re-evaluation. She reports feeling improved. Ambulating with pulse ox she maintained 95% and did not appear SOB. She was speaking while walking without difficulty. Feel she can be discharged home with return precautions. Patient and family are comfortable with plan.   Charlann Lange, PA-C 06/21/15 2154  Virgel Manifold, MD 06/24/15 [phone]

## 2015-06-21 NOTE — ED Notes (Addendum)
Pt reports shortness of breath yesterday, took 40 mg prednisone and albuterol and breathing treatment yet no relief. Pt is hyperventilating in triage room , audible wheezing upon assessment. Also reports cough and chest pain when coughing . Pt is able to speak full sentences. Hx azthma.

## 2015-06-21 NOTE — ED Provider Notes (Signed)
CSN: [id number]     Arrival date & time 06/21/15  1846 History  By signing my name below, I, Cindy Hoffman, attest that this documentation has been prepared under the direction and in the presence of Haruko Mersch, PA-C. Electronically Signed: Judithann Sauger, ED Scribe. 06/21/2015. 7:41 PM.    Chief Complaint  Patient presents with  . Shortness of Breath   The history is provided by the patient. No language interpreter was used.   HPI Comments: Cindy Hoffman is a 40 y.o. female with a hx of asthma who presents to the Emergency Department complaining of gradually worsening SOB onset yesterday. She reports associated mild productive cough, nasal congestion, lightheadedness, dizziness, and nausea. She states that she took 40 mg Prednisone and a double albuterol yesterday which provided temporary relief as her symptoms returned this am. She states that she took approx. 6 albuterol treatments and 40 more mg of Prednisone today at home with no relief. She reports that she took phenergan which helped her nausea. Pt reports that she is a current smoker and is constantly exposed to smoking at her job. No fever, chills, or vomiting.    Past Medical History  Diagnosis Date  . Asthma   . Migraine   . Obesity   . Thyroid disease   . Fibromyalgia   . Anxiety   . GERD (gastroesophageal reflux disease)   . Fracture of right foot   . Anxiety   . Complication of anesthesia     "STATES WAKES UP AND CANT BREATHE AND NEEDS BREATHING TREATMENT  IN PACU"  . Dysrhythmia     palpitations- "from thyroid"  . Sleep apnea      NO CPAP--could not tolerates mask  . Viral respiratory infection 06/26/13    ? influenza   Past Surgical History  Procedure Laterality Date  . Tubal ligation    . Nasal septum surgery    . Wisdom tooth extraction    . Thyroid lobectomy Right 07/19/2013    Procedure: THYROID LOBECTOMY;  Surgeon: Harl Bowie, MD;  Location: [facility];  Service: General;  Laterality:  Right;   Family History  Problem Relation Age of Onset  . Hypertension Mother   . Hypertension Father   . Diabetes Sister   . Cancer Paternal Grandmother     breast   Social History  Substance Use Topics  . Smoking status: Light Tobacco Smoker -- 0.25 packs/day for 20 years    Types: Cigarettes  . Smokeless tobacco: Never Used  . Alcohol Use: No     Comment: socially   OB History    No data available     Review of Systems  Constitutional: Negative for fever and chills.  HENT: Positive for congestion.   Respiratory: Positive for cough and shortness of breath.   Gastrointestinal: Positive for nausea. Negative for vomiting.  Neurological: Positive for dizziness and light-headedness.      Allergies  Beta adrenergic blockers  Home Medications   Prior to Admission medications   Medication Sig Start Date End Date Taking? Authorizing Provider  albuterol (PROVENTIL HFA;VENTOLIN HFA) 108 (90 BASE) MCG/ACT inhaler Inhale 1 puff into the lungs every 6 (six) hours as needed for wheezing or shortness of breath.    Historical Provider, MD  furosemide (LASIX) 20 MG tablet Take 2 tablets (40 mg total) by mouth daily. Patient taking differently: Take 40 mg by mouth daily as needed for fluid.  10/03/14   Etta Quill, NP  LORazepam (ATIVAN) 1 MG  tablet Take 1 mg by mouth every 8 (eight) hours as needed for anxiety.    Historical Provider, MD  lubiprostone (AMITIZA) 24 MCG capsule Take 1 capsule (24 mcg total) by mouth 2 (two) times daily with a meal. 01/04/15   Hannah Muthersbaugh, PA-C  modafinil (PROVIGIL) 200 MG tablet Take 100 mg by mouth daily as needed (stay awake).     Historical Provider, MD  mometasone-formoterol (DULERA) 200-5 MCG/ACT AERO Inhale 2 puffs into the lungs 2 (two) times daily.    Historical Provider, MD  ondansetron (ZOFRAN-ODT) 4 MG disintegrating tablet Take 4 mg by mouth every 8 (eight) hours as needed. Nausea 10/10/14   Historical Provider, MD  oxyCODONE-acetaminophen  (PERCOCET/ROXICET) 5-325 MG per tablet Take 1-2 tablets by mouth every 6 (six) hours as needed for severe pain. Patient taking differently: Take 1-2 tablets by mouth 2 (two) times daily.  04/27/14   Alfonzo Beers, MD  pantoprazole (PROTONIX) 40 MG tablet Take 40 mg by mouth daily.    Historical Provider, MD  phenazopyridine (PYRIDIUM) 200 MG tablet Take 1 tablet (200 mg total) by mouth 3 (three) times daily. Patient not taking: Reported on 01/04/2015 12/16/14   Davonna Belling, MD  polyethylene glycol [facility] / Floria Raveling) packet Take 17 g by mouth daily as needed for moderate constipation.    Historical Provider, MD  zolpidem (AMBIEN) 10 MG tablet Take 10 mg by mouth at bedtime as needed for sleep.    Historical Provider, MD   BP 146/74 mmHg  Pulse 98  Temp(Src) 97.6 F (36.4 C) (Oral)  Resp 24  SpO2 97%  LMP 06/14/2015 Physical Exam  Constitutional: She is oriented to person, place, and time. She appears well-developed and well-nourished. No distress.  HENT:  Head: Normocephalic and atraumatic.  Eyes: Conjunctivae and EOM are normal.  Neck: Neck supple. No tracheal deviation present.  Cardiovascular: Normal rate, regular rhythm and normal heart sounds.   Pulmonary/Chest: Effort normal. No respiratory distress. She has wheezes.  Inspiratory and expiratory wheezes bilaterally.  Musculoskeletal: Normal range of motion. She exhibits edema.  Neurological: She is alert and oriented to person, place, and time.  Skin: Skin is warm and dry.  Psychiatric: She has a normal mood and affect. Her behavior is normal.  Nursing note and vitals reviewed.   ED Course  Procedures (including critical care time) DIAGNOSTIC STUDIES: Oxygen Saturation is 97% on RA, normal by my interpretation.    COORDINATION OF CARE: 7:38 PM- Pt advised of plan for treatment and pt agrees. Pt will receive an hour long breathing treatment. She will also receive a chest x-ray for further treatment.   Imaging Review Dg  Chest 2 View  06/21/2015  CLINICAL DATA:  Cough and shortness of breath since yesterday. EXAM: CHEST  2 VIEW COMPARISON:  03/01/2015 FINDINGS: The cardiomediastinal contours are normal. Stable bronchitic markings. Pulmonary vasculature is normal. No consolidation, pleural effusion, or pneumothorax. No acute osseous abnormalities are seen. IMPRESSION: No acute pulmonary process.  Stable chronic bronchitic change. Electronically Signed   By: Jeb Levering M.D.   On: 06/21/2015 20:03     Jeannett Senior, PA-C has personally reviewed and evaluated these images and lab results as part of my medical decision-making.  MDM   Final diagnoses:  Asthma exacerbation   Patient reports shortness of breath, cough, wheezing onset yesterday. 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 She states she took 6 breathing treatments at home and total of 60 mg of prednisone with no relief. She received one breathing treatment at triage and states she is not feeling better. Patient's vital signs are normal, she is wheezing on exam. She is moving air, and is able to speak in full sentences. Patient continues to repeat "I think, and trouble" and states that she knows that she cannot get this under control at home. Will put an IV, ordered Solu-Medrol IV, will give an hour-long breathing treatment now that she has had 7 regular treatments in the last 5 hours. We'll also add some magnesium, patient states he has worked in the past. Patient's chest x-ray is pending.  Filed Vitals:   06/21/15 1850 06/21/15 2003 06/21/15 2200  BP: 146/74  141/84  Pulse: 98  98  Temp: 97.6 F (36.4 C)    TempSrc: Oral    Resp: 24  22  SpO2: 97% 96% 98%     Jeannett Senior, PA-C 06/23/15 1754  Virgel Manifold, MD 06/26/15 [phone]

## 2015-06-21 NOTE — ED Notes (Signed)
Treatment finished and patient tolerated well.

## 2015-06-21 NOTE — ED Notes (Signed)
Patient d/c'd self care with family.  F/U and medications discussed.  Patient verbalized understanding.

## 2015-10-12 ENCOUNTER — Emergency Department (HOSPITAL_COMMUNITY): Payer: Medicaid Other

## 2015-10-12 ENCOUNTER — Emergency Department (HOSPITAL_COMMUNITY)
Admission: EM | Admit: 2015-10-12 | Discharge: 2015-10-13 | Disposition: A | Discharge status: Home / Self Care | Payer: Medicaid Other | Attending: Emergency Medicine | Admitting: Emergency Medicine

## 2015-10-12 ENCOUNTER — Encounter (HOSPITAL_COMMUNITY): Payer: Self-pay | Admitting: Emergency Medicine

## 2015-10-12 DIAGNOSIS — F17210 Nicotine dependence, cigarettes, uncomplicated: Secondary | ICD-10-CM | POA: Insufficient documentation

## 2015-10-12 DIAGNOSIS — J069 Acute upper respiratory infection, unspecified: Secondary | ICD-10-CM

## 2015-10-12 DIAGNOSIS — M5481 Occipital neuralgia: Secondary | ICD-10-CM | POA: Diagnosis not present

## 2015-10-12 DIAGNOSIS — J45909 Unspecified asthma, uncomplicated: Secondary | ICD-10-CM | POA: Diagnosis present

## 2015-10-12 DIAGNOSIS — Z79899 Other long term (current) drug therapy: Secondary | ICD-10-CM | POA: Insufficient documentation

## 2015-10-12 DIAGNOSIS — J45901 Unspecified asthma with (acute) exacerbation: Secondary | ICD-10-CM

## 2015-10-12 LAB — CBC WITH DIFFERENTIAL/PLATELET
Basophils Absolute: 0 10*3/uL (ref 0.0–0.1)
Basophils Relative: 0 %
Eosinophils Absolute: 0 10*3/uL (ref 0.0–0.7)
Eosinophils Relative: 0 %
HCT: 40.2 % (ref 36.0–46.0)
Hemoglobin: 12.6 g/dL (ref 12.0–15.0)
Lymphocytes Relative: 7 %
Lymphs Abs: 0.5 10*3/uL — ABNORMAL LOW (ref 0.7–4.0)
MCH: 26.5 pg (ref 26.0–34.0)
MCHC: 31.3 g/dL (ref 30.0–36.0)
MCV: 84.5 fL (ref 78.0–100.0)
Monocytes Absolute: 0.3 10*3/uL (ref 0.1–1.0)
Monocytes Relative: 3 %
Neutro Abs: 6.8 10*3/uL (ref 1.7–7.7)
Neutrophils Relative %: 90 %
Platelets: 352 10*3/uL (ref 150–400)
RBC: 4.76 MIL/uL (ref 3.87–5.11)
RDW: 14.7 % (ref 11.5–15.5)
WBC: 7.6 10*3/uL (ref 4.0–10.5)

## 2015-10-12 LAB — BASIC METABOLIC PANEL
Anion gap: 10 (ref 5–15)
BUN: 8 mg/dL (ref 6–20)
CO2: 23 mmol/L (ref 22–32)
Calcium: 8.7 mg/dL — ABNORMAL LOW (ref 8.9–10.3)
Chloride: 103 mmol/L (ref 101–111)
Creatinine, Ser: 0.75 mg/dL (ref 0.44–1.00)
GFR calc Af Amer: >60 mL/min (ref >60)
GFR calc non Af Amer: >60 mL/min (ref >60)
Glucose, Bld: 122 mg/dL — ABNORMAL HIGH (ref 65–99)
Potassium: 4.6 mmol/L (ref 3.5–5.1)
Sodium: 136 mmol/L (ref 135–145)

## 2015-10-12 MED ORDER — ALBUTEROL SULFATE (2.5 MG/3ML) 0.083% IN NEBU
INHALATION_SOLUTION | RESPIRATORY_TRACT | Status: AC
  Filled 2015-10-12: qty 6

## 2015-10-12 MED ORDER — ALBUTEROL SULFATE (2.5 MG/3ML) 0.083% IN NEBU
5.0000 mg | INHALATION_SOLUTION | Freq: Once | RESPIRATORY_TRACT | Status: AC
  Administered 2015-10-12: 5 mg via RESPIRATORY_TRACT

## 2015-10-12 MED ORDER — ALBUTEROL (5 MG/ML) CONTINUOUS INHALATION SOLN
10.0000 mg/h | INHALATION_SOLUTION | Freq: Once | RESPIRATORY_TRACT | Status: AC
  Administered 2015-10-12: 10 mg/h via RESPIRATORY_TRACT
  Filled 2015-10-12: qty 20

## 2015-10-12 MED ORDER — KETOROLAC TROMETHAMINE 15 MG/ML IJ SOLN
15.0000 mg | Freq: Once | INTRAMUSCULAR | Status: AC
  Administered 2015-10-12: 15 mg via INTRAMUSCULAR
  Filled 2015-10-12: qty 1

## 2015-10-12 NOTE — ED Notes (Signed)
Pt. reports asthma attack onset yesterday with wheezing and productive cough unrelieved by MDI . Denies fever or chills.

## 2015-10-12 NOTE — ED Provider Notes (Signed)
CSN: [id number]     Arrival date & time 10/12/15  2031 History   First MD Initiated Contact with Patient 10/12/15 2246     Chief Complaint  Patient presents with  . Asthma    HPI   40 year old female with a history of asthma presents today with complaints of upper respiratory infection has been exacerbation. Patient reports symptoms started approximately 4 days ago with URI-type symptoms including sore throat, rhinorrhea, cough, nasal congestion. She reports coughing exacerbated her occipital neuralgia with pain in the right posterior head radiating to her ear. She reports she has not needed her nebulized albuterol at home until this morning. She reports shortness of breath, wheezing, need for nebulization which did not significantly improve her symptoms. Patient reports yesterday she felt hot, but did not have a thermometer at home. She reports URI symptoms continue to persist, with no worsening. She denies any neck stiffness, specific chest pain, abdominal pain, or any urinary complaints. Patient reports a significant past medical history of pneumonia, no need for hospitalization or intubations. Patient does not take any daily medications for her asthma.  Past Medical History  Diagnosis Date  . Asthma   . Migraine   . Obesity   . Thyroid disease   . Fibromyalgia   . Anxiety   . GERD (gastroesophageal reflux disease)   . Fracture of right foot   . Anxiety   . Complication of anesthesia     "STATES WAKES UP AND CANT BREATHE AND NEEDS BREATHING TREATMENT  IN PACU"  . Dysrhythmia     palpitations- "from thyroid"  . Sleep apnea      NO CPAP--could not tolerates mask  . Viral respiratory infection 06/26/13    ? influenza   Past Surgical History  Procedure Laterality Date  . Tubal ligation    . Nasal septum surgery    . Wisdom tooth extraction    . Thyroid lobectomy Right 07/19/2013    Procedure: THYROID LOBECTOMY;  Surgeon: Harl Bowie, MD;  Location: [facility];  Service: General;   Laterality: Right;   Family History  Problem Relation Age of Onset  . Hypertension Mother   . Hypertension Father   . Diabetes Sister   . Cancer Paternal Grandmother     breast   Social History  Substance Use Topics  . Smoking status: Light Tobacco Smoker -- 0.25 packs/day for 0 years    Types: Cigarettes  . Smokeless tobacco: Never Used  . Alcohol Use: No     Comment: socially   OB History    No data available     Review of Systems  All other systems reviewed and are negative.   Allergies  Beta adrenergic blockers  Home Medications   Prior to Admission medications   Medication Sig Start Date End Date Taking? Authorizing Provider  albuterol (PROVENTIL HFA;VENTOLIN HFA) 108 (90 BASE) MCG/ACT inhaler Inhale 1 puff into the lungs every 6 (six) hours as needed for wheezing or shortness of breath.   Yes Historical Provider, MD  albuterol (PROVENTIL) (2.5 MG/3ML) 0.083% nebulizer solution Take 2.5 mg by nebulization every 6 (six) hours as needed for wheezing or shortness of breath.   Yes Historical Provider, MD  furosemide (LASIX) 20 MG tablet Take 2 tablets (40 mg total) by mouth daily. Patient taking differently: Take 40 mg by mouth daily as needed for fluid.  10/03/14  Yes Etta Quill, NP  LORazepam (ATIVAN) 1 MG tablet Take 1 mg by mouth every 8 (eight) hours  as needed for anxiety.   Yes Historical Provider, MD  modafinil (PROVIGIL) 200 MG tablet Take 100 mg by mouth daily as needed (stay awake).    Yes Historical Provider, MD  mometasone-formoterol (DULERA) 200-5 MCG/ACT AERO Inhale 2 puffs into the lungs 2 (two) times daily.   Yes Historical Provider, MD  ondansetron (ZOFRAN-ODT) 4 MG disintegrating tablet Take 4 mg by mouth every 8 (eight) hours as needed. Nausea 10/10/14  Yes Historical Provider, MD  oxyCODONE-acetaminophen (PERCOCET/ROXICET) 5-325 MG per tablet Take 1-2 tablets by mouth every 6 (six) hours as needed for severe pain. Patient taking differently: Take 1 tablet  by mouth every 12 (twelve) hours.  04/27/14  Yes Alfonzo Beers, MD  zolpidem (AMBIEN) 10 MG tablet Take 10 mg by mouth at bedtime as needed for sleep.   Yes Historical Provider, MD  predniSONE (DELTASONE) 20 MG tablet Take 2 tablets (40 mg total) by mouth daily. 10/13/15   Deneene Tarver, PA-C   BP 123/74 mmHg  Pulse 101  Temp(Src) 97.7 F (36.5 C) (Oral)  Resp 28  Ht 5\' 4"  (1.626 m)  Wt 159.78 kg  BMI 60.43 kg/m2  SpO2 100%  LMP 10/05/2015 (Approximate)   Physical Exam  Constitutional: She is oriented to person, place, and time. She appears well-developed and well-nourished.  HENT:  Head: Normocephalic and atraumatic.  Neck supple full active range of motion. Posterior head nontender, neck nontender  Eyes: Conjunctivae are normal. Pupils are equal, round, and reactive to light. Right eye exhibits no discharge. Left eye exhibits no discharge. No scleral icterus.  Neck: Normal range of motion. No JVD present. No tracheal deviation present.  Cardiovascular: Regular rhythm, normal heart sounds and intact distal pulses.  Exam reveals no gallop and no friction rub.   No murmur heard. Pulmonary/Chest: Effort normal. No stridor. No respiratory distress. She has wheezes. She has no rales. She exhibits no tenderness.  Neurological: She is alert and oriented to person, place, and time. She has normal strength. No cranial nerve deficit or sensory deficit. Coordination normal. GCS eye subscore is 4. GCS verbal subscore is 5. GCS motor subscore is 6.  Skin: Skin is warm and dry. No rash noted. No erythema. No pallor.  Psychiatric: She has a normal mood and affect. Her behavior is normal. Judgment and thought content normal.  Nursing note and vitals reviewed.   ED Course  Procedures (including critical care time) Labs Review Labs Reviewed  CBC WITH DIFFERENTIAL/PLATELET - Abnormal; Notable for the following:    Lymphs Abs 0.5 (*)    All other components within normal limits  BASIC METABOLIC  PANEL - Abnormal; Notable for the following:    Glucose, Bld 122 (*)    Calcium 8.7 (*)    All other components within normal limits    Imaging Review Dg Chest 2 View  10/12/2015  CLINICAL DATA:  Acute onset of wheezing and productive cough. Initial encounter. EXAM: CHEST  2 VIEW COMPARISON:  Chest radiograph performed 06/21/2015 FINDINGS: The lungs are well-aerated. Vascular congestion is noted. Peribronchial thickening is seen. Increased interstitial markings may reflect mild interstitial edema. There is no evidence of pleural effusion or pneumothorax. The heart is normal in size; the mediastinal contour is within normal limits. No acute osseous abnormalities are seen. IMPRESSION: Vascular congestion noted. Peribronchial thickening seen. Increased interstitial markings may reflect mild interstitial edema. Electronically Signed   By: Garald Balding M.D.   On: 10/12/2015 23:24   I have personally reviewed and evaluated these images  and lab results as part of my medical decision-making.   EKG Interpretation None      MDM   Final diagnoses:  Asthma attack  Viral URI  Occipital neuralgia of right side    Labs: CBC, BMP  Imaging: DG chest 2 view  Consults:   Therapeutics: Prednisone, Percocet, Toradol, albuterol  Discharge Meds: Prednisone   Assessment/Plan: 40 year old female presents today with asthma exacerbation. This is likely due to her viral URI. Patient initially had wheezing on exam, after breathing treatments here symptoms resolved. Patient be placed on his own for exacerbation in setting of continued URI. She has no signs of bacterial infection here today, no respiratory distress. Patient also having headache likely due to possible neuralgia. Patient has been dealing with this for some time now with no significant improvement in her symptoms. She was given pain medication here in the ED for this. Patient has close follow-up with her neurologist. She has no signs or symptoms  that would indicate need for further evaluation or management here in the ED. Patient verbalized understanding and agreement today's plan had no further questions or concerns      Okey Regal, PA-C 10/13/15 0113  Gareth Morgan, MD 10/13/15 1251

## 2015-10-13 MED ORDER — OXYCODONE-ACETAMINOPHEN 5-325 MG PO TABS
1.0000 | ORAL_TABLET | Freq: Once | ORAL | Status: AC
  Administered 2015-10-13: 1 via ORAL
  Filled 2015-10-13: qty 1

## 2015-10-13 MED ORDER — PREDNISONE 20 MG PO TABS
60.0000 mg | ORAL_TABLET | Freq: Once | ORAL | Status: AC
  Administered 2015-10-13: 60 mg via ORAL
  Filled 2015-10-13: qty 3

## 2015-10-13 MED ORDER — PREDNISONE 20 MG PO TABS: 40.0000 mg | ORAL_TABLET | Freq: Every day | ORAL | Status: DC

## 2015-10-13 NOTE — Discharge Instructions (Signed)
Please use medication as directed, please use albuterol at home as needed. Please follow-up with her primary care provider for reevaluation. If concerning signs or symptoms present please return for further evaluation and management. Asthma, Acute Bronchospasm Acute bronchospasm caused by asthma is also referred to as an asthma attack. Bronchospasm means your air passages become narrowed. The narrowing is caused by inflammation and tightening of the muscles in the air tubes (bronchi) in your lungs. This can make it hard to breathe or cause you to wheeze and cough. CAUSES Possible triggers are:  Animal dander from the skin, hair, or feathers of animals.  Dust mites contained in house dust.  Cockroaches.  Pollen from trees or grass.  Mold.  Cigarette or tobacco smoke.  Air pollutants such as dust, household cleaners, hair sprays, aerosol sprays, paint fumes, strong chemicals, or strong odors.  Cold air or weather changes. Cold air may trigger inflammation. Winds increase molds and pollens in the air.  Strong emotions such as crying or laughing hard.  Stress.  Certain medicines such as aspirin or beta-blockers.  Sulfites in foods and drinks, such as dried fruits and wine.  Infections or inflammatory conditions, such as a flu, cold, or inflammation of the nasal membranes (rhinitis).  Gastroesophageal reflux disease (GERD). GERD is a condition where stomach acid backs up into your esophagus.  Exercise or strenuous activity. SIGNS AND SYMPTOMS   Wheezing.  Excessive coughing, particularly at night.  Chest tightness.  Shortness of breath. DIAGNOSIS  Your health care provider will ask you about your medical history and perform a physical exam. A chest X-ray or blood testing may be performed to look for other causes of your symptoms or other conditions that may have triggered your asthma attack. TREATMENT  Treatment is aimed at reducing inflammation and opening up the airways  in your lungs. Most asthma attacks are treated with inhaled medicines. These include quick relief or rescue medicines (such as bronchodilators) and controller medicines (such as inhaled corticosteroids). These medicines are sometimes given through an inhaler or a nebulizer. Systemic steroid medicine taken by mouth or given through an IV tube also can be used to reduce the inflammation when an attack is moderate or severe. Antibiotic medicines are only used if a bacterial infection is present.  HOME CARE INSTRUCTIONS   Rest.  Drink plenty of liquids. This helps the mucus to remain thin and be easily coughed up. Only use caffeine in moderation and do not use alcohol until you have recovered from your illness.  Do not smoke. Avoid being exposed to secondhand smoke.  You play a critical role in keeping yourself in good health. Avoid exposure to things that cause you to wheeze or to have breathing problems.  Keep your medicines up-to-date and available. Carefully follow your health care provider's treatment plan.  Take your medicine exactly as prescribed.  When pollen or pollution is bad, keep windows closed and use an air conditioner or go to places with air conditioning.  Asthma requires careful medical care. See your health care provider for a follow-up as advised. If you are more than [redacted] weeks pregnant and you were prescribed any new medicines, let your obstetrician know about the visit and how you are doing. Follow up with your health care provider as directed.  After you have recovered from your asthma attack, make an appointment with your outpatient doctor to talk about ways to reduce the likelihood of future attacks. If you do not have a doctor who manages your  asthma, make an appointment with a primary care doctor to discuss your asthma. SEEK IMMEDIATE MEDICAL CARE IF:   You are getting worse.  You have trouble breathing. If severe, call your local emergency services (911 in the  [place]  You develop chest pain or discomfort.  You are vomiting.  You are not able to keep fluids down.  You are coughing up yellow, green, brown, or bloody sputum.  You have a fever and your symptoms suddenly get worse.  You have trouble swallowing. MAKE SURE YOU:   Understand these instructions.  Will watch your condition.  Will get help right away if you are not doing well or get worse.   This information is not intended to replace advice given to you by your health care provider. Make sure you discuss any questions you have with your health care provider.   Document Released: 08/04/2006 Document Revised: 04/24/2013 Document Reviewed: 10/25/2012 Elsevier Interactive Patient Education [employer].

## 2015-11-21 ENCOUNTER — Ambulatory Visit: Payer: Medicaid Other | Attending: Unknown Physician Specialty | Admitting: Physical Therapy

## 2015-11-21 ENCOUNTER — Encounter: Payer: Self-pay | Admitting: Physical Therapy

## 2015-11-21 DIAGNOSIS — G44221 Chronic tension-type headache, intractable: Secondary | ICD-10-CM | POA: Diagnosis present

## 2015-11-21 DIAGNOSIS — M542 Cervicalgia: Secondary | ICD-10-CM | POA: Diagnosis present

## 2015-11-21 NOTE — Therapy (Signed)
[place], [place], [address] Phone: [phone]   Fax:  [phone]  Physical Therapy Evaluation  Patient Details  Name: Cindy Hoffman MRN: [id number] Date of Birth: 04/25/1976 Referring Provider: Tonia Ghent  Encounter Date: 11/21/2015      PT End of Session - 11/21/15 1049    Visit Number 1   Authorization Type Medicaid   Authorization - Number of Visits 1   PT Start Time 1015   PT Stop Time 1126   PT Time Calculation (min) 71 min   Activity Tolerance Patient tolerated treatment well   Behavior During Therapy [facility] for tasks assessed/performed      Past Medical History  Diagnosis Date  . Asthma   . Migraine   . Obesity   . Thyroid disease   . Fibromyalgia   . Anxiety   . GERD (gastroesophageal reflux disease)   . Fracture of right foot   . Anxiety   . Complication of anesthesia     "STATES WAKES UP AND CANT BREATHE AND NEEDS BREATHING TREATMENT  IN PACU"  . Dysrhythmia     palpitations- "from thyroid"  . Sleep apnea      NO CPAP--could not tolerates mask  . Viral respiratory infection 06/26/13    ? influenza    Past Surgical History  Procedure Laterality Date  . Tubal ligation    . Nasal septum surgery    . Wisdom tooth extraction    . Thyroid lobectomy Right 07/19/2013    Procedure: THYROID LOBECTOMY;  Surgeon: Harl Bowie, MD;  Location: [facility];  Service: General;  Laterality: Right;    There were no vitals filed for this visit.       Subjective Assessment - 11/21/15 1019    Subjective Patient reports some HA issues since Octoiber 2016.  She reports ringing int he ears at that time.  CT scan was negative.  Some neck pain pain on the right side, HA is right occiptal area.   Patient Stated Goals have less pain   Currently in Pain? Yes   Pain Score 4    Pain Location Head   Pain Orientation Right   Pain Descriptors / Indicators  Aching;Burning;Shooting;Sharp;Throbbing   Pain Type Neuropathic pain   Pain Onset More than a month ago   Pain Frequency Constant   Aggravating Factors  reports everything can increase the pain 9/10 on a daily basis   Pain Relieving Factors a TENS unit helps   Effect of Pain on Daily Activities limits all ADL's            [facility] PT Assessment - 11/21/15 0001    Assessment   Medical Diagnosis headache, neck pain   Referring Provider Mieden   Onset Date/Surgical Date 10/22/15   Prior Therapy no   Precautions   Precautions None   Balance Screen   Has the patient fallen in the past 6 months No   Has the patient had a decrease in activity level because of a fear of falling?  No   Is the patient reluctant to leave their home because of a fear of falling?  No   Home Environment   Additional Comments has a 40 year old, does housework   Prior Function   Level of Independence Independent   Vocation Unemployed   Leisure walks some   Posture/Postural Control   Posture Comments fwd head, rounded shoulders   ROM / Strength  AROM / PROM / Strength AROM;Strength   AROM   Overall AROM Comments cervical ROM flexion WNL's, all other motions decreased 50% with some pain   Strength   Overall Strength Comments WNL's for shoulders   Palpation   Palpation comment very tight upper traps and right levator and cervical area, some swelling at the right occipital area                   [facility] Adult PT Treatment/Exercise - 11/21/15 0001    Modalities   Modalities Ultrasound;Moist Heat;Electrical Stimulation;Cryotherapy   Moist Heat Therapy   Number Minutes Moist Heat 15 Minutes   Moist Heat Location Shoulder   Cryotherapy   Number Minutes Cryotherapy 15 Minutes   Cryotherapy Location Cervical   Type of Cryotherapy Ice pack   Electrical Stimulation   Electrical Stimulation Location right cervical and upper trap area   Electrical Stimulation Action IFC   Electrical Stimulation  Parameters supine   Electrical Stimulation Goals Pain   Ultrasound   Ultrasound Location right upper trap and cervical area   Ultrasound Parameters 100% 1.6w/cm2   Ultrasound Goals Pain                PT Education - 11/21/15 1049    Education provided Yes   Education Details cervical and scapular retractions, shoulder shrugs, upper trap and levator stretch   Person(s) Educated Patient   Methods Explanation;Demonstration;Handout   Comprehension Verbalized understanding             PT Long Term Goals - 11/21/15 1051    PT LONG TERM GOAL #1   Title independent with HEP   Time 1   Period Days   Status Achieved               Plan - 11/21/15 1050    Clinical Impression Statement Patient iwth right side neck and head pain since October, CT scan was negative.  Seems to be a trigger point in the right upper trap area.  She has significant spasms in the right cervical area.   Rehab Potential Good   PT Frequency One time visit   PT Treatment/Interventions Cryotherapy;Electrical Stimulation;Moist Heat;Ultrasound;Patient/family education;Therapeutic exercise;Manual techniques   PT Next Visit Plan Medicaid only allows 1 visit per year, she is to try TENS and exercises at home   Consulted and Agree with Plan of Care Patient      Patient will benefit from skilled therapeutic intervention in order to improve the following deficits and impairments:  Decreased range of motion, Increased muscle spasms, Impaired flexibility, Improper body mechanics, Postural dysfunction, Pain  Visit Diagnosis: Cervicalgia - Plan: PT plan of care cert/re-cert  Chronic tension-type headache, intractable - Plan: PT plan of care cert/re-cert     Problem List Patient Active Problem List   Diagnosis Date Noted  . Thyroid nodule 07/19/2013  . Right thyroid nodule 06/18/2013  . Morbid obesity ([place]) 05/01/2013  . Asthma, chronic 02/01/2013  . Smoker 02/01/2013    Sumner Boast.,  PT 11/21/2015, 10:53 AM  [place] [address] [place], [place], [address] Phone: [phone]   Fax:  [phone]  Name: Cindy Hoffman MRN: [id number] Date of Birth: 24-Aug-1975

## 2015-12-11 ENCOUNTER — Telehealth: Payer: Self-pay | Admitting: Neurology

## 2015-12-11 ENCOUNTER — Ambulatory Visit (INDEPENDENT_AMBULATORY_CARE_PROVIDER_SITE_OTHER): Payer: Medicaid Other | Admitting: Neurology

## 2015-12-11 ENCOUNTER — Encounter: Payer: Self-pay | Admitting: Neurology

## 2015-12-11 VITALS — BP 158/89 | HR 91 | Ht 64.0 in | Wt 347.2 lb

## 2015-12-11 DIAGNOSIS — R200 Anesthesia of skin: Secondary | ICD-10-CM | POA: Diagnosis not present

## 2015-12-11 DIAGNOSIS — R51 Headache: Secondary | ICD-10-CM | POA: Diagnosis not present

## 2015-12-11 DIAGNOSIS — R930 Abnormal findings on diagnostic imaging of skull and head, not elsewhere classified: Secondary | ICD-10-CM

## 2015-12-11 DIAGNOSIS — M6289 Other specified disorders of muscle: Secondary | ICD-10-CM

## 2015-12-11 DIAGNOSIS — M5010 Cervical disc disorder with radiculopathy, unspecified cervical region: Secondary | ICD-10-CM | POA: Diagnosis not present

## 2015-12-11 DIAGNOSIS — R531 Weakness: Secondary | ICD-10-CM

## 2015-12-11 DIAGNOSIS — R519 Headache, unspecified: Secondary | ICD-10-CM

## 2015-12-11 DIAGNOSIS — G35 Multiple sclerosis: Secondary | ICD-10-CM

## 2015-12-11 DIAGNOSIS — R202 Paresthesia of skin: Secondary | ICD-10-CM

## 2015-12-11 DIAGNOSIS — H539 Unspecified visual disturbance: Secondary | ICD-10-CM | POA: Diagnosis not present

## 2015-12-11 DIAGNOSIS — R9082 White matter disease, unspecified: Secondary | ICD-10-CM

## 2015-12-11 DIAGNOSIS — M5481 Occipital neuralgia: Secondary | ICD-10-CM | POA: Diagnosis not present

## 2015-12-11 DIAGNOSIS — M542 Cervicalgia: Secondary | ICD-10-CM | POA: Diagnosis not present

## 2015-12-11 NOTE — Patient Instructions (Addendum)
Remember to drink plenty of fluid, eat healthy meals and do not skip any meals. Try to eat protein with a every meal and eat a healthy snack such as fruit or nuts in between meals. Try to keep a regular sleep-wake schedule and try to exercise daily, particularly in the form of walking, 20-30 minutes a day, if you can.   As far as your medications are concerned, I would like to suggest Call tomorrow if not better and I can start some medication and schedule for more nerve blocks  As far as diagnostic testing: MRI of the brain and cervical cord  I would like to see you back in 4 weeks, sooner if we need to. Please call us with any interim questions, concerns, problems, updates or refill requests.   Our phone number is [phone]. We also have an after hours call service for urgent matters and there is a physician on-call for urgent questions. For any emergencies you know to call 911 or go to the nearest emergency room  Trileptal if no relief with the nerve blocks. Discussed side effects  as below:  Oxcarbazepine tablets What is this medicine? OXCARBAZEPINE (ox car BAZ e peen) is used to treat people with epilepsy. It helps prevent partial seizures. This medicine may be used for other purposes; ask your health care provider or pharmacist if you have questions. What should I tell my health care provider before I take this medicine? They need to know if you have any of these conditions: -Asian ancestry -kidney disease -liver disease -suicidal thoughts, plans, or attempt; a previous suicide attempt by you or a family member -any unusual or allergic reaction to oxcarbazepine, carbamazepine, other medicines, foods, dyes, or preservatives -pregnant or trying to get pregnant -breast-feeding How should I use this medicine? Take this medicine by mouth with a glass of water. Follow the directions on the prescription label. This medicine may be taken with or without food. Take your doses at regular  intervals. Do not take your medicine more often than directed. Do not stop taking except on the advice of your doctor or health care professional. A special MedGuide will be given to you by the pharmacist with each prescription and refill. Be sure to read this information carefully each time. Talk to your pediatrician regarding the use of this medicine in children. While this medicine may be prescribed for children as young as 2 years for selected conditions, precautions do apply. Overdosage: If you think you have taken too much of this medicine contact a poison control center or emergency room at once. NOTE: This medicine is only for you. Do not share this medicine with others. What if I miss a dose? If you miss a dose, take it as soon as you can. If it is almost time for your next dose, take only that dose. Do not take double or extra doses. What may interact with this medicine? Do not take this medicine with any of the following medications: -carbamazepine This medicine may also interact with the following medications: -birth control pills -certain medicines for seizures like phenobarbital, phenytoin, valproic acid -certain medicines for high blood pressure like felodipine, diltiazem, verapamil -cyclosporine This list may not describe all possible interactions. Give your health care provider a list of all the medicines, herbs, non-prescription drugs, or dietary supplements you use. Also tell them if you smoke, drink alcohol, or use illegal drugs. Some items may interact with your medicine. What should I watch for while using this medicine? Visit  your doctor or health care professional for regular checks on your progress. Do not stop taking this medicine suddenly. This increases the risk of seizures. Wear a Probation officer or necklace. Carry an identification card with information about your condition, medications, and doctor or health care professional. Rarely, serious skin allergic  reactions may occur with this medicine. If you develop a skin rash, redness, itching, peeling skin inside your mouth, swollen glands, or a fever while taking this medicine, contact your health care provider immediately. You may get drowsy or dizzy. Do not drive, use machinery, or do anything that needs mental alertness until you know how this drug affects you. Do not stand or sit up quickly, especially if you are an older patient. This reduces the risk of dizzy or fainting spells. Alcohol can make you more drowsy and dizzy. Avoid alcoholic drinks. Birth control pills may not work properly while you are taking this medicine. Talk to your doctor about using an extra method of birth control. The use of this medicine may increase the chance of suicidal thoughts or actions. Pay special attention to how you are responding while on this medicine. Any worsening of mood, or thoughts of suicide or dying should be reported to your health care professional right away. Women who become pregnant while using this medicine may enroll in the [place] Pregnancy Registry by calling [phone]. This registry collects information about the safety of antiepileptic drug use during pregnancy. What side effects may I notice from receiving this medicine? Side effects that you should report to your doctor or health care professional as soon as possible: -allergic reactions such as skin rash or itching, hives, swelling of the lips, mouth, tongue, or throat -changes in vision -confusion -difficulty passing urine or change in the amount of urine -fever -infection -nausea, vomiting -problems with balance, speaking, walking -redness, blistering, peeling or loosening of the skin, including inside the mouth -swelling of feet, hands -unusual bleeding, bruising -unusually weak or tired -worsening of mood, thoughts or actions of suicide or dying -yellowing of eyes, skin Side effects that usually do not  require medical attention (report to your doctor or health care professional if they continue or are bothersome): -constipation or diarrhea -headache -loss of appetite -nervous -stomach upset -tremors -trouble sleeping This list may not describe all possible side effects. Call your doctor for medical advice about side effects. You may report side effects to FDA at 1-800-FDA-1088. Where should I keep my medicine? Keep out of reach of children. Store at room temperature between 15 and 30 degrees C (59 and 86 degrees F). Keep container tightly closed. Throw away any unused medicine after the expiration date. NOTE: This sheet is a summary. It may not cover all possible information. If you have questions about this medicine, talk to your doctor, pharmacist, or health care provider.    2016, Elsevier/Gold Standard. (2012-10-18 15:09:57)

## 2015-12-11 NOTE — Progress Notes (Signed)
[id number]    Provider:  Dr Jaynee Eagles Referring Provider: Lauraine Rinne Primary Care Physician:  Berkley Harvey, NP  CC:  Occipital neuralgia and MS  HPI:  Cindy Hoffman is a 40 y.o. female here as a referral from Dr. Luciana Axe for occipital neuralgia. Past medical history of migraine, morbid obesity, anxiety, fibromyalgia, hypothyroidism, asthma, disc degenerative disease. She was diagnosed with Occipital Neuralgia at [facility]. Symptoms started 02/28/2015 awoke with a loud rining in the ears. No inciting event or trauma. Since June 11 it has been daily and excruciating. It is burning, aching, throbbing on the right side (points to the occipital area). Her jaw and ear hurts. She has neck pain. She endorses numbness in the left and weakness in the arms. Her mouth went numb as well. MRi of the brain in 03/2015 with abnormal white matter periventricular.  She also has migraines since 1997. She had abnormal white matter changes in the brain in 03/2015. Severe 10/10 pain continuous. Worse at night. Vision changes. Headaches. Dizziness. Weakness in legs. Multiple neurologic symptoms for many years started when she was pregnant. No FHx MS or autoimmune disease   Reviewed notes, labs and imaging from outside physicians, which showed:   BMP unremarkable with normal creatinine 0.75, cbc unremarkable  Personally reviewed images and agree with the following: At least 5 small FLAIR hyperintensities in the bilateral cerebral white matter with hazy periventricular hyperintensity around the frontal horn of the left lateral ventricle and the body/atrium of the right lateral ventricle. The hazy areas have no volume loss/mass effect or enhancement. No juxta cortical, corpus callosum, or infratentorial signal abnormality. No anterior temporal involvement. No historical indication of immunocompromised state/HIV.  IMPRESSION: 1. No explanation for hearing loss. 2. 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  Review of Systems: Patient complains of symptoms per HPI as well as the following symptoms fatigue, blurred vision, eye pain, swelling in legs, asthma,, ringing in ears, spinning sensation, joint pain, cramps, aching muscles, memory loss, confusion, headache, numbness, weakness, dizziness, insomnia, sleepiness, snoring, anxiety, not enough sleep, decreased energy, disinterest in activities: . Pertinent negatives per HPI. All others negative.   Social History   Social History  . Marital status: Legally Separated    Spouse name: N/A  . Number of children: 1  . Years of education: GED   Occupational History  . unemployed    Social History Main Topics  . Smoking status: Light Tobacco Smoker    Packs/day: 0.25    Years: 0.00    Types: Cigarettes  . Smokeless tobacco: Never Used  . Alcohol use No     Comment: socially  . Drug use: No  . Sexual activity: Not on file   Other Topics Concern  . Not on file   Social History Narrative   Lives with father   Caffeine use: none    Family History  Problem Relation Age of Onset  . Hypertension Mother   . Hypertension Father   . Diabetes Sister   . Cancer Paternal Grandmother     breast    Past Medical History:  Diagnosis Date  . Anxiety   . Anxiety   . Asthma   . Complication of anesthesia    "STATES WAKES UP AND CANT BREATHE AND NEEDS BREATHING TREATMENT  IN PACU"  . Dysrhythmia    palpitations- "from thyroid"  . Fibromyalgia   . Fracture of right foot   .  GERD (gastroesophageal reflux disease)   . Migraine   . Obesity   . Sleep apnea     NO CPAP--could not tolerates mask  . Thyroid disease   . Viral respiratory infection 06/26/13   ? influenza    Past Surgical History:  Procedure Laterality Date  . NASAL SEPTUM SURGERY    . THYROID LOBECTOMY Right 07/19/2013   Procedure: THYROID LOBECTOMY;  Surgeon: Harl Bowie, MD;  Location: [facility];  Service: General;  Laterality: Right;  . TUBAL LIGATION    . WISDOM TOOTH EXTRACTION      Current Outpatient Prescriptions  Medication Sig Dispense Refill  . acetaminophen (TYLENOL) 500 MG tablet Take 500 mg by mouth every 6 (six) hours as needed.    Marland Kitchen albuterol (PROVENTIL HFA;VENTOLIN HFA) 108 (90 BASE) MCG/ACT inhaler Inhale 1 puff into the lungs every 6 (six) hours as needed for wheezing or shortness of breath.    Marland Kitchen albuterol (PROVENTIL) (2.5 MG/3ML) 0.083% nebulizer solution Take 2.5 mg by nebulization every 6 (six) hours as needed for wheezing or shortness of breath.    . furosemide (LASIX) 20 MG tablet Take 2 tablets (40 mg total) by mouth daily. (Patient taking differently: Take 40 mg by mouth daily as needed for fluid. ) 20 tablet 0  . LORazepam (ATIVAN) 1 MG tablet Take 1 mg by mouth every 8 (eight) hours as needed for anxiety.    . Lorcaserin HCl 10 MG TABS Take 10 mg by mouth 2 (two) times daily.    . mometasone-formoterol (DULERA) 200-5 MCG/ACT AERO Inhale 2 puffs into the lungs 2 (two) times daily.    Marland Kitchen oxyCODONE-acetaminophen (PERCOCET/ROXICET) 5-325 MG per tablet Take 1-2 tablets by mouth every 6 (six) hours as needed for severe pain. (Patient taking differently: Take 1 tablet by mouth every 12 (twelve) hours. ) 6 tablet 0  . promethazine (PHENERGAN) 25 MG tablet Take 25 mg by mouth every 4 (four) hours as needed for nausea or vomiting.    Marland Kitchen tiZANidine (ZANAFLEX) 4 MG capsule Take 4 mg by mouth daily.    Marland Kitchen zolpidem (AMBIEN) 10 MG tablet Take 10 mg by mouth at bedtime as needed for sleep.     No current facility-administered medications for this visit.     Allergies as of 12/11/2015 - Review Complete 11/21/2015  Allergen Reaction Noted  . Beta adrenergic blockers Anaphylaxis 12/02/2012    Vitals: BP (!) 158/89 (BP Location: Left Wrist, Patient Position: Sitting, Cuff Size: Normal)   Pulse 91   Ht 5\' 4"  (1.626 m)   Wt (!) 347 lb 3.2 oz  (157.5 kg)   BMI 59.60 kg/m  Last Weight:  Wt Readings from Last 1 Encounters:  12/11/15 (!) 347 lb 3.2 oz (157.5 kg)   Last Height:   Ht Readings from Last 1 Encounters:  12/11/15 5\' 4"  (1.626 m)   Physical exam: Exam: Gen: NAD, conversant, well nourised, morbidly obese, well groomed                     CV: RRR, no MRG. No Carotid Bruits. No peripheral edema, warm, nontender Eyes: Conjunctivae clear without exudates or hemorrhage  Neuro: Detailed Neurologic Exam  Speech:    Speech is normal; fluent and spontaneous with normal comprehension.  Cognition:    The patient is oriented to person, place, and time;     recent and remote memory intact;     language fluent;     normal attention, concentration,  fund of knowledge Cranial Nerves:    The pupils are equal, round, and reactive to light. The fundi are normal and spontaneous venous pulsations are present. Visual fields are full to finger confrontation. Extraocular movements are intact. Trigeminal sensation is intact and the muscles of mastication are normal. The face is symmetric. The palate elevates in the midline. Hearing intact. Voice is normal. Shoulder shrug is normal. The tongue has normal motion without fasciculations.   Coordination:    Normal finger to nose and heel to shin. Normal rapid alternating movements.   Gait:    Wide based due to extremely large body habitus  Motor Observation:     No asymmetry, no atrophy, and no involuntary movements noted. Tone:    Normal muscle tone.    Posture:    Posture is normal. normal erect    Strength:mild prox left arm and leg weakness.otherwsie strength is V/V in the upper and lower limbs.      Sensation: intact to LT     Reflex Exam:  DTR's: absent AJs otherwise deep tendon reflexes in the upper and lower extremities are normal bilaterally.   Toes:    The toes are downgoing bilaterally.   Clonus:    Clonus is absent.   Right nerve block too occipital     Assessment/Plan:  40 y.o. female here as a referral from Dr. Luciana Axe for occipital neuralgia. Past medical history of migraine, morbid obesity, anxiety, fibromyalgia, hypothyroidism, asthma, disc degenerative disease. Multiple neurologic complaints and abnormal white matter changes on brain.  Repeat MRi of the brain and cervical cord to evaluate for abnormal white matter changes, MS Occipital nerve block today Discussed trileptal if nerve blocks do not help. Discussed side effects as per patient instructions.  Cc: Dr. Standley Dakins, [facility] [address] [place], [place] [address]  Phone [phone] Fax [phone]

## 2015-12-12 ENCOUNTER — Other Ambulatory Visit: Payer: Self-pay | Admitting: Neurology

## 2015-12-12 ENCOUNTER — Telehealth: Payer: Self-pay | Admitting: Neurology

## 2015-12-12 DIAGNOSIS — M5481 Occipital neuralgia: Secondary | ICD-10-CM | POA: Insufficient documentation

## 2015-12-12 DIAGNOSIS — Z79899 Other long term (current) drug therapy: Secondary | ICD-10-CM

## 2015-12-12 MED ORDER — OXCARBAZEPINE 300 MG PO TABS: 300.0000 mg | ORAL_TABLET | Freq: Two times a day (BID) | ORAL | 12 refills | Status: DC

## 2015-12-12 NOTE — Telephone Encounter (Signed)
Left patient a message. Started trileptal. Reiterated side effects as discussed at office visit. Will need repeat labs in one month to check sodium and white blood cells. Terrence Dupont, would you call her next week and let her know how to come for labs in 4-6 weeks? Thanks!

## 2015-12-12 NOTE — Telephone Encounter (Signed)
Patient called, states she was advised by Dr. Jaynee Eagles to call back this morning to let her know how she was doing after shots last night. Patient states "shots put lumps all in her head and pain is back. Where she put shots, really hurts. I don't want shots anymore". Patient would like to try the medication, doesn't remember the name of this medication. Patient states "head hurts worse than before the shots".

## 2015-12-15 ENCOUNTER — Other Ambulatory Visit: Payer: Self-pay | Admitting: Neurology

## 2015-12-15 MED ORDER — GABAPENTIN 300 MG PO CAPS: ORAL_CAPSULE | ORAL | 11 refills | Status: DC

## 2015-12-15 NOTE — Telephone Encounter (Signed)
Dr Jaynee Eagles- please advise  Called pt. Relayed Dr Jaynee Eagles message. She stated she cannot take the tripleptal. She took on 12/12/15 and 12/13/15 1 tab 2x/day and it made her "irrate/mood crazy". She stopped the medication and does not want to take any more of it. She wants to know if she can go back to taking the gabapentin. She was previously taking 900mg  3x/day. Advised I will send message to Dr Jaynee Eagles and call her back once I receive answer. She verbalized understanding.

## 2015-12-15 NOTE — Telephone Encounter (Signed)
That is fine she can go back to her neurontin. If she has not been on it for a while, I would suggest she slowly increases back to 900mg  three times a day and not all at once. If she needs a prescription I can call it in thanks. Watch for somnolence and other side effects: Common side effects include dizziness, drowsiness, weakness, tired feeling, nausea, diarrhea, constipation, blurred vision, headache, breast swelling, dry mouth, loss of balance or coordination. This is not a complete list, stop for anything concerning. Do not drive until you know how this medication affects you. thanks

## 2015-12-15 NOTE — Telephone Encounter (Signed)
Dr Jaynee Eagles- can you send in new rx for gabapentin? thank you  Called pt back. Relayed Dr Cathren Laine message below. She verbalized understanding. She needs new rx called into her pharmacy d/t her other rx being 40 yr old. Advised we will send in. I verified pt pharmacy. She verbalized understanding.

## 2015-12-23 NOTE — Telephone Encounter (Signed)
Noted, patient is scheduled at [facility]. DW

## 2015-12-24 ENCOUNTER — Ambulatory Visit
Admission: RE | Admit: 2015-12-24 | Discharge: 2015-12-24 | Disposition: A | Discharge status: Home / Self Care | Payer: Medicaid Other | Source: Ambulatory Visit | Attending: Neurology | Admitting: Neurology

## 2015-12-24 DIAGNOSIS — H539 Unspecified visual disturbance: Secondary | ICD-10-CM | POA: Diagnosis not present

## 2015-12-24 DIAGNOSIS — R51 Headache: Principal | ICD-10-CM

## 2015-12-24 DIAGNOSIS — R519 Headache, unspecified: Secondary | ICD-10-CM

## 2015-12-24 DIAGNOSIS — R200 Anesthesia of skin: Secondary | ICD-10-CM

## 2015-12-24 DIAGNOSIS — M5481 Occipital neuralgia: Secondary | ICD-10-CM

## 2015-12-24 DIAGNOSIS — M542 Cervicalgia: Secondary | ICD-10-CM

## 2015-12-24 DIAGNOSIS — R202 Paresthesia of skin: Secondary | ICD-10-CM

## 2015-12-24 DIAGNOSIS — R9082 White matter disease, unspecified: Secondary | ICD-10-CM

## 2015-12-24 DIAGNOSIS — G35 Multiple sclerosis: Secondary | ICD-10-CM

## 2015-12-24 DIAGNOSIS — M6289 Other specified disorders of muscle: Secondary | ICD-10-CM | POA: Diagnosis not present

## 2015-12-24 DIAGNOSIS — R531 Weakness: Secondary | ICD-10-CM

## 2015-12-24 DIAGNOSIS — M5010 Cervical disc disorder with radiculopathy, unspecified cervical region: Secondary | ICD-10-CM

## 2015-12-24 MED ORDER — GADOBENATE DIMEGLUMINE 529 MG/ML IV SOLN: 20.0000 mL | Freq: Once | INTRAVENOUS | Status: DC | PRN

## 2015-12-29 ENCOUNTER — Telehealth: Payer: Self-pay | Admitting: *Deleted

## 2015-12-29 NOTE — Telephone Encounter (Signed)
LVM requesting call back re: MRI results. Left name, number.

## 2015-12-30 ENCOUNTER — Telehealth: Payer: Self-pay | Admitting: *Deleted

## 2015-12-30 NOTE — Telephone Encounter (Signed)
Pt given results yesterday by pt.  She has appt with CM/NP 12-31-15 at 930.

## 2015-12-30 NOTE — Telephone Encounter (Signed)
Pt was given results yesterday.  Has appt with CM/NP 12-31-15 at 0930.

## 2015-12-30 NOTE — Telephone Encounter (Signed)
LVM for pt to call. Gave [facility] phone number.  **Please make earlier f/u with CM, NP per Dr Jaynee Eagles request so patient can be seen sooner.

## 2015-12-30 NOTE — Telephone Encounter (Signed)
Per Dr Will Bonnet with patient and informed her that her MRI cervical spine was normal. Also informed her that her  MRI of the brain is normal for her age and medical conditions, no change from 2016, not consistent with multiple sclerosis. The patient stated "I just don't understand why my head hurts and my ear hurts." Reminded her of upcoming follow up 01/21/16, and she stated "why do I have to wait so long?"  Attempted to move her follow up but explained that this would better be done by Dr Cathren Laine RN, Terrence Dupont. Advised patient this RN will send note to Terrence Dupont to call her and move her follow up to a closer date. She verbalized understanding, appreciation.

## 2015-12-30 NOTE — Telephone Encounter (Signed)
She needs to be seen by NP. Make her appointment with [facility] thanks

## 2015-12-30 NOTE — Telephone Encounter (Signed)
-----   Message from Melvenia Beam, MD sent at 12/26/2015 12:28 PM EDT ----- MRI of the brain normal for age and medical conditions, no change from 2016, not consistent with multiple sclerosis(patient was worried about this) please reassure patient.  thanks

## 2015-12-30 NOTE — Telephone Encounter (Signed)
-----   Message from Melvenia Beam, MD sent at 12/26/2015 12:38 PM EDT ----- MRI cervical spine normal thanks

## 2015-12-30 NOTE — Telephone Encounter (Signed)
Per Verneita Griffes- pt cannot come 01/08/16 in the morning. Would need afternoon appt.

## 2015-12-30 NOTE — Telephone Encounter (Signed)
Dr Jaynee Eagles- pt wants to be seen sooner than 9/20. Are you wanting to use NP slot? Please advise

## 2015-12-31 ENCOUNTER — Encounter: Payer: Self-pay | Admitting: Nurse Practitioner

## 2015-12-31 ENCOUNTER — Ambulatory Visit (INDEPENDENT_AMBULATORY_CARE_PROVIDER_SITE_OTHER): Payer: Medicaid Other | Admitting: Nurse Practitioner

## 2015-12-31 VITALS — BP 128/71 | HR 77 | Ht 64.0 in | Wt 346.8 lb

## 2015-12-31 DIAGNOSIS — M5481 Occipital neuralgia: Secondary | ICD-10-CM

## 2015-12-31 MED ORDER — CARBAMAZEPINE ER 300 MG PO CP12: 300.0000 mg | ORAL_CAPSULE | Freq: Two times a day (BID) | ORAL | 3 refills | Status: DC

## 2015-12-31 NOTE — Patient Instructions (Signed)
Carbatrol 300 mg twice daily Continue gabapentin at current dose Continue tizanidine Limit over-the-counter Tylenol due to rebound Follow-up with Dr. Jaynee Eagles in 1 month to 6 weeks

## 2015-12-31 NOTE — Telephone Encounter (Signed)
Called pt again since no return call. She stated she saw CM.NP today. She apologized for not contacting me back.

## 2015-12-31 NOTE — Progress Notes (Addendum)
[facility]  PATIENT: Cindy Hoffman DOB: 1976/02/04   REASON FOR VISIT: Follow-up for occipital neuralgia right side HISTORY FROM: Patient    HISTORY OF PRESENT ILLNESS:UPDATE 08/30/2017CM Cindy Hoffman, 40 year old female returns for follow-up she was recently evaluated by Dr. Jaynee Eagles on 12/11/2015 for occipital neuralgia. MRI of the brain with no acute findings and compared to MRI in 2016 no change. MRI of the cervical spine was normal. Patient claims she has had her current headache for approximately 2 months. Her nerve block that she received by Dr. Jaynee Eagles made her headaches worse. She claims she has failed Topamax  in the past with side effects. She also had side effects to Trileptal. She is currently taking gabapentin 300 mg 2 tablets 3 times a day, this is causing drowsiness. She is also on tizanidine daily these are prescribed by her primary care. She also takes regular Tylenol for her headaches and was cautioned against that for rebound. She is seen at a pain clinic. She has been evaluated for bariatric surgery however she needs to lose weight prior to the procedure. She returns for reevaluation    HISTORY 12/11/15 Cindy Hoffman is a 40 y.o. female here as a referral from Dr. Luciana Axe for occipital neuralgia. Past medical history of migraine, morbid obesity, anxiety, fibromyalgia, hypothyroidism, asthma, disc degenerative disease. She was diagnosed with Occipital Neuralgia at [facility]. Symptoms started 02/28/2015 awoke with a loud rining in the ears. No inciting event or trauma. Since June 11 it has been daily and excruciating. It is burning, aching, throbbing on the right side (points to the occipital area). Her jaw and ear hurts. She has neck pain. She endorses numbness in the left and weakness in the arms. Her mouth went numb as well. MRi of the brain in 03/2015 with abnormal white matter periventricular.  She also has migraines since 1997. She had abnormal white matter changes  in the brain in 03/2015. Severe 10/10 pain continuous. Worse at night. Vision changes. Headaches. Dizziness. Weakness in legs. Multiple neurologic symptoms for many years started when she was pregnant. No FHx MS or autoimmune disease   REVIEW OF SYSTEMS: Full 14 system review of systems performed and notable only for those listed, all others are neg:  Constitutional: Fatigue Cardiovascular: neg Ear/Nose/Throat: Hearing loss and ringing in the ears Skin: neg Eyes: Blurred vision Respiratory: neg Gastroitestinal: neg  Hematology/Lymphatic: neg  Endocrine: neg Musculoskeletal: Joint pain back pain muscle cramps neck pain Allergy/Immunology: neg Neurological: Headache dizziness, memory loss Psychiatric: neg Sleep : neg   ALLERGIES: Allergies  Allergen Reactions  . Beta Adrenergic Blockers Anaphylaxis    HOME MEDICATIONS: Outpatient Medications Prior to Visit  Medication Sig Dispense Refill  . acetaminophen (TYLENOL) 500 MG tablet Take 500 mg by mouth every 6 (six) hours as needed.    Marland Kitchen albuterol (PROVENTIL HFA;VENTOLIN HFA) 108 (90 BASE) MCG/ACT inhaler Inhale 1 puff into the lungs every 6 (six) hours as needed for wheezing or shortness of breath.    Marland Kitchen albuterol (PROVENTIL) (2.5 MG/3ML) 0.083% nebulizer solution Take 2.5 mg by nebulization every 6 (six) hours as needed for wheezing or shortness of breath.    . furosemide (LASIX) 20 MG tablet Take 2 tablets (40 mg total) by mouth daily. (Patient taking differently: Take 40 mg by mouth daily as needed for fluid. ) 20 tablet 0  . gabapentin (NEURONTIN) 300 MG capsule Start one pill three times a day. Can increase to 2 pills twice daily in one week if  needed. Watch for somnolence. (Patient taking differently: Take 600 mg by mouth 2 (two) times daily. Start one pill three times a day. Can increase to 2 pills twice daily in one week if needed. Watch for somnolence.) 180 capsule 11  . LORazepam (ATIVAN) 1 MG tablet Take 1 mg by mouth every 8  (eight) hours as needed for anxiety.    . mometasone-formoterol (DULERA) 200-5 MCG/ACT AERO Inhale 2 puffs into the lungs 2 (two) times daily.    Marland Kitchen oxyCODONE-acetaminophen (PERCOCET/ROXICET) 5-325 MG per tablet Take 1-2 tablets by mouth every 6 (six) hours as needed for severe pain. (Patient taking differently: Take 1 tablet by mouth every 12 (twelve) hours. ) 6 tablet 0  . promethazine (PHENERGAN) 25 MG tablet Take 25 mg by mouth every 4 (four) hours as needed for nausea or vomiting.    Marland Kitchen tiZANidine (ZANAFLEX) 4 MG capsule Take 4 mg by mouth daily.    Marland Kitchen zolpidem (AMBIEN) 10 MG tablet Take 10 mg by mouth at bedtime as needed for sleep.    . Lorcaserin HCl 10 MG TABS Take 10 mg by mouth 2 (two) times daily.    . Oxcarbazepine (TRILEPTAL) 300 MG tablet Take 1 tablet (300 mg total) by mouth 2 (two) times daily. (Patient not taking: Reported on 12/31/2015) 60 tablet 12   No facility-administered medications prior to visit.     PAST MEDICAL HISTORY: Past Medical History:  Diagnosis Date  . Anxiety   . Anxiety   . Asthma   . Chronic pain syndrome   . Complication of anesthesia    "STATES WAKES UP AND CANT BREATHE AND NEEDS BREATHING TREATMENT  IN PACU"  . Dysrhythmia    palpitations- "from thyroid"  . Fibromyalgia   . Fracture of right foot   . GERD (gastroesophageal reflux disease)   . Migraine   . Myofacial muscle pain   . Obesity   . OSA (obstructive sleep apnea)    Has CPAP  . Sleep apnea     NO CPAP--could not tolerates mask  . Thyroid disease   . Viral respiratory infection 06/26/13   ? influenza    PAST SURGICAL HISTORY: Past Surgical History:  Procedure Laterality Date  . NASAL SEPTUM SURGERY    . THYROID LOBECTOMY Right 07/19/2013   Procedure: THYROID LOBECTOMY;  Surgeon: Harl Bowie, MD;  Location: [facility];  Service: General;  Laterality: Right;  . TUBAL LIGATION    . WISDOM TOOTH EXTRACTION      FAMILY HISTORY: Family History  Problem Relation Age of Onset    . Hypertension Mother   . Hypertension Father   . Diabetes Sister   . Cancer Paternal Grandmother     breast    SOCIAL HISTORY: Social History   Social History  . Marital status: Legally Separated    Spouse name: N/A  . Number of children: 1  . Years of education: GED   Occupational History  . unemployed    Social History Main Topics  . Smoking status: Light Tobacco Smoker    Packs/day: 0.25    Years: 0.00    Types: Cigarettes  . Smokeless tobacco: Never Used  . Alcohol use No     Comment: socially  . Drug use: No  . Sexual activity: Not on file   Other Topics Concern  . Not on file   Social History Narrative   Lives with father   Caffeine use: none     PHYSICAL EXAM  Vitals:  12/31/15 0936  BP: 128/71  Pulse: 77  Weight: (!) 346 lb 12.8 oz (157.3 kg)  Height: 5\' 4"  (1.626 m)   Body mass index is 59.53 kg/m.  Generalized: Well developed, Morbidly obese in no acute distress tearful during history taking Head: normocephalic and atraumatic,. Oropharynx benign  Neck: Supple, no carotid bruits  Cardiac: Regular rate rhythm, no murmur  Musculoskeletal: No deformity   Neurological examination   Mentation: Alert oriented to time, place, history taking. Attention span and concentration appropriate. Recent and remote memory intact.  Follows all commands speech and language fluent.   Cranial nerve II-XII: Fundoscopic exam reveals sharp disc margins.Pupils were equal round reactive to light extraocular movements were full, visual field were full on confrontational test. Facial sensation and strength were normal. hearing was intact to finger rubbing bilaterally. Uvula tongue midline. head turning and shoulder shrug were normal and symmetric.Tongue protrusion into cheek strength was normal. Motor: normal bulk and tone, full strength in the BUE, BLE, fine finger movements normal, no pronator drift. No focal weakness Sensory: normal and symmetric to light touch,  pinprick, and  Vibration, in the upper and lower extremities Coordination: finger-nose-finger, heel-to-shin bilaterally, no dysmetria Reflexes: Symmetric upper and plantar responses were flexor bilaterally. Gait and Station: Rising up from seated position without assistance, wide based  stance due to  body habitus,  moderate stride, good arm swing, smooth turning, able to perform tiptoe, and heel walking without difficulty. Tandem gait is mildly unsteady steady  DIAGNOSTIC DATA (LABS, IMAGING, TESTING) - I reviewed patient records, labs, notes, testing and imaging myself where available.  Lab Results  Component Value Date   WBC 7.6 10/12/2015   HGB 12.6 10/12/2015   HCT 40.2 10/12/2015   MCV 84.5 10/12/2015   PLT 352 10/12/2015      Component Value Date/Time   NA 136 10/12/2015 2340   K 4.6 10/12/2015 2340   CL 103 10/12/2015 2340   CO2 23 10/12/2015 2340   GLUCOSE 122 (H) 10/12/2015 2340   BUN 8 10/12/2015 2340   CREATININE 0.75 10/12/2015 2340   CALCIUM 8.7 (L) 10/12/2015 2340   PROT 7.6 01/04/2015 1338   ALBUMIN 3.7 01/04/2015 1338   AST 14 (L) 01/04/2015 1338   ALT 12 (L) 01/04/2015 1338   ALKPHOS 78 01/04/2015 1338   BILITOT 0.3 01/04/2015 1338   GFRNONAA >60 10/12/2015 2340   GFRAA >60 10/12/2015 2340    ASSESSMENT AND PLAN 40 y.o. female here for occipital neuralgia. Past medical history of migraine, morbid obesity, anxiety, fibromyalgia, hypothyroidism, asthma, disc degenerative disease. Multiple neurologic complaints and abnormal white matter changes on brain. Most recent MRI of the brain with no acute findings and compared to MRI in 2016 no change. MRI of the cervical spine was normal.  Discussed with Dr. Jaynee Eagles Carbatrol 300 mg twice daily Continue gabapentin at current dose Continue tizanidine Limit over-the-counter Tylenol due to rebound Follow-up with Dr. Jaynee Eagles in 1 month to 6 weeks may need medial brachial blocks by pain management Dennie Bible, [facility],  [facility], APRN  [facility] [address], [place],  [address] [phone]  Personally have participated in and made any corrections needed to history, physical, neuro exam,assessment and plan as stated above.  I have personally discussed with NP, evaluated lab date, reviewed imaging studies and agree with radiology interpretations.    Sarina Ill, MD [facility]

## 2016-01-21 ENCOUNTER — Ambulatory Visit (INDEPENDENT_AMBULATORY_CARE_PROVIDER_SITE_OTHER): Payer: Medicaid Other | Admitting: Neurology

## 2016-01-21 ENCOUNTER — Ambulatory Visit: Payer: Medicaid Other | Admitting: Neurology

## 2016-01-21 ENCOUNTER — Encounter: Payer: Self-pay | Admitting: Neurology

## 2016-01-21 VITALS — BP 128/82 | HR 72 | Ht 64.0 in | Wt 345.4 lb

## 2016-01-21 DIAGNOSIS — M5481 Occipital neuralgia: Secondary | ICD-10-CM | POA: Diagnosis not present

## 2016-01-21 MED ORDER — AMITRIPTYLINE HCL 10 MG PO TABS: 10.0000 mg | ORAL_TABLET | Freq: Every day | ORAL | 12 refills | Status: DC

## 2016-01-21 NOTE — Progress Notes (Signed)
[id number]   Provider:  Dr Jaynee Eagles Referring Provider: Lauraine Rinne Primary Care Physician:  Berkley Harvey, NP  CC:  Occipital neuralgia  Interval History 01/31/2016:  She comes back for occipital neuralgia. MRi of the brain and cervical spine unremarkable. She takes regular tylenol and this can be causing rebound however patient is still taking regardless of our instructions not to take daily or more than 2-3x in a week. Repeat MRi of the brain and cervical cord to evaluate for abnormal white matter changes NOT consistent with MS. Needs to follow with primary care for management of vascular risk factors and obesity as white matter changes likely chronic microvascular changes. MRI of the cervical spine normal.  She does not take Ambien at night. She is taking muscle relaxer. She has insomnia. She has anxiety. She in on multiple medications. She is seen at a pain clinic Dr. Rochele Pages at [facility].   - For her occipital neuralgia she did not tolerate: Occipital nerve blocks, Trileptal. Medications not helping: Neurontin. Intolerant of Topamax in the past.  -  Start Amitriptyline very low dose which may help with occipital neuralgia and headache, her insomnia and chronic pain  MRI brain 12/26/2015: IMPRESSION:  This MRI of the brain with and without contrast shows the following: 1.   A few scattered T2/FLAIR hyperintense changes in the subcortical white matter and some subtle periventricular white matter changes. This is a nonspecific finding and could represent early chronic microvascular ischemic change or be the sequela of migraine.  Demyelination is less likely to give this pattern. 2.   There were no acute findings and there is a normal enhancement pattern. When compared to the MRI dated 03/31/2015, there were no definite interval changes.  MRI cervical spine: normal  HPI:  Cindy Hoffman is a 40 y.o. female here as a referral from Dr. Luciana Axe for occipital neuralgia. Past  medical history of migraine, morbid obesity, anxiety, fibromyalgia, hypothyroidism, asthma, disc degenerative disease. She was diagnosed with Occipital Neuralgia at [facility]. Symptoms started 02/28/2015 awoke with a loud rining in the ears. No inciting event or trauma. Since June 11 it has been daily and excruciating. It is burning, aching, throbbing on the right side (points to the occipital area). Her jaw and ear hurts. She has neck pain. She endorses numbness in the left and weakness in the arms. Her mouth went numb as well. MRi of the brain in 03/2015 with abnormal white matter periventricular.  She also has migraines since 1997. She had abnormal white matter changes in the brain in 03/2015. Severe 10/10 pain continuous. Worse at night. Vision changes. Headaches. Dizziness. Weakness in legs. Multiple neurologic symptoms for many years started when she was pregnant. No FHx MS or autoimmune disease   Reviewed notes, labs and imaging from outside physicians, which showed:   BMP unremarkable with normal creatinine 0.75, cbc unremarkable  Personally reviewed images and agree with the following: At least 5 small FLAIR hyperintensities in the bilateral cerebral white matter with hazy periventricular hyperintensity around the frontal horn of the left lateral ventricle and the body/atrium of the right lateral ventricle. The hazy areas have no volume loss/mass effect or enhancement. No juxta cortical, corpus callosum, or infratentorial signal abnormality. No anterior temporal involvement. No historical indication of immunocompromised state/HIV.  IMPRESSION: 1. No explanation for hearing loss. 2. 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  Review of Systems: Patient complains of symptoms per  HPI as well as the following symptoms fatigue, blurred vision, eye pain, swelling in legs, asthma,, ringing in ears, spinning  sensation, joint pain, cramps, aching muscles, memory loss, confusion, headache, numbness, weakness, dizziness, insomnia, sleepiness, snoring, anxiety, not enough sleep, decreased energy, disinterest in activities: . Pertinent negatives per HPI. All others negative.   Social History   Social History  . Marital status: Legally Separated    Spouse name: N/A  . Number of children: 1  . Years of education: GED   Occupational History  . unemployed    Social History Main Topics  . Smoking status: Light Tobacco Smoker    Packs/day: 0.25    Years: 0.00    Types: Cigarettes  . Smokeless tobacco: Never Used  . Alcohol use No     Comment: socially  . Drug use: No  . Sexual activity: Not on file   Other Topics Concern  . Not on file   Social History Narrative   Lives with father   Caffeine use: none    Family History  Problem Relation Age of Onset  . Hypertension Mother   . Hypertension Father   . Diabetes Sister   . Cancer Paternal Grandmother     breast    Past Medical History:  Diagnosis Date  . Anxiety   . Anxiety   . Asthma   . Chronic pain syndrome   . Complication of anesthesia    "STATES WAKES UP AND CANT BREATHE AND NEEDS BREATHING TREATMENT  IN PACU"  . Dysrhythmia    palpitations- "from thyroid"  . Fibromyalgia   . Fracture of right foot   . GERD (gastroesophageal reflux disease)   . Migraine   . Myofacial muscle pain   . Obesity   . OSA (obstructive sleep apnea)    Has CPAP  . Sleep apnea     NO CPAP--could not tolerates mask  . Thyroid disease   . Viral respiratory infection 06/26/13   ? influenza    Past Surgical History:  Procedure Laterality Date  . NASAL SEPTUM SURGERY    . THYROID LOBECTOMY Right 07/19/2013   Procedure: THYROID LOBECTOMY;  Surgeon: Harl Bowie, MD;  Location: [facility];  Service: General;  Laterality: Right;  . TUBAL LIGATION    . WISDOM TOOTH EXTRACTION      Current Outpatient Prescriptions  Medication Sig Dispense  Refill  . albuterol (PROVENTIL HFA;VENTOLIN HFA) 108 (90 BASE) MCG/ACT inhaler Inhale 1 puff into the lungs every 6 (six) hours as needed for wheezing or shortness of breath.    Marland Kitchen albuterol (PROVENTIL) (2.5 MG/3ML) 0.083% nebulizer solution Take 2.5 mg by nebulization every 6 (six) hours as needed for wheezing or shortness of breath.    . furosemide (LASIX) 20 MG tablet Take 2 tablets (40 mg total) by mouth daily. (Patient taking differently: Take 40 mg by mouth daily as needed for fluid. ) 20 tablet 0  . gabapentin (NEURONTIN) 300 MG capsule Start one pill three times a day. Can increase to 2 pills twice daily in one week if needed. Watch for somnolence. (Patient taking differently: Take 600 mg by mouth 2 (two) times daily. Start one pill three times a day. Can increase to 2 pills twice daily in one week if needed. Watch for somnolence.) 180 capsule 11  . LORazepam (ATIVAN) 1 MG tablet Take 1 mg by mouth every 8 (eight) hours as needed for anxiety.    . Lorcaserin HCl 10 MG TABS Take 10 mg by  mouth 2 (two) times daily.    . mometasone-formoterol (DULERA) 200-5 MCG/ACT AERO Inhale 2 puffs into the lungs 2 (two) times daily.    Marland Kitchen oxyCODONE-acetaminophen (PERCOCET/ROXICET) 5-325 MG per tablet Take 1-2 tablets by mouth every 6 (six) hours as needed for severe pain. (Patient taking differently: Take 1 tablet by mouth every 12 (twelve) hours. ) 6 tablet 0  . promethazine (PHENERGAN) 25 MG tablet Take 25 mg by mouth every 4 (four) hours as needed for nausea or vomiting.    Marland Kitchen tiZANidine (ZANAFLEX) 4 MG capsule Take 4 mg by mouth daily.    Marland Kitchen zolpidem (AMBIEN) 10 MG tablet Take 10 mg by mouth at bedtime as needed for sleep.     No current facility-administered medications for this visit.     Allergies as of 01/21/2016 - Review Complete 12/31/2015  Allergen Reaction Noted  . Beta adrenergic blockers Anaphylaxis 12/02/2012    Vitals: BP 128/82 (BP Location: Left Wrist, Patient Position: Sitting, Cuff  Size: Normal)   Pulse 72   Ht 5\' 4"  (1.626 m)   Wt (!) 345 lb 6.4 oz (156.7 kg)   BMI 59.29 kg/m  Last Weight:  Wt Readings from Last 1 Encounters:  01/21/16 (!) 345 lb 6.4 oz (156.7 kg)   Last Height:   Ht Readings from Last 1 Encounters:  01/21/16 5\' 4"  (1.626 m)    Physical exam: Exam: Gen: NAD, conversant, well nourised, morbidly obese, well groomed                     Eyes: Conjunctivae clear without exudates or hemorrhage  Neuro: Detailed Neurologic Exam  Speech:    Speech is normal; fluent and spontaneous with normal comprehension.  Cognition:    The patient is oriented to person, place, and time;     recent and remote memory intact;     language fluent;     normal attention, concentration,     fund of knowledge Cranial Nerves:    The pupils are equal, round, and reactive to light. The fundi are normal and spontaneous venous pulsations are present. Visual fields are full to finger confrontation. Extraocular movements are intact. Trigeminal sensation is intact and the muscles of mastication are normal. The face is symmetric. The palate elevates in the midline. Hearing intact. Voice is normal. Shoulder shrug is normal. The tongue has normal motion without fasciculations.     Gait:    Wide based due to extremely large body habitus  Motor Observation:     No asymmetry, no atrophy, and no involuntary movements noted. Tone:    Normal muscle tone.    Posture:    Posture is normal. normal erect    Strength:mild prox left arm and leg weakness.otherwsie strength is V/V in the upper and lower limbs.          Reflex Exam:  DTR's: absent AJs otherwise deep tendon reflexes in the upper and lower extremities are normal bilaterally.   Toes:    The toes are downgoing bilaterally.   Clonus:    Clonus is absent.   Right nerve block too occipital    Assessment/Plan:  40 y.o. female here as a referral from Dr. Luciana Axe for occipital neuralgia.Diagnosed at [facility] with  occipital neuralgia. Symptoms consistent with this diagnosis.  Past medical history of migraine, morbid obesity, anxiety, fibromyalgia, hypothyroidism, asthma, disc degenerative disease. Multiple neurologic complaints.  - Repeat MRi of the brain and cervical cord to evaluate for abnormal white matter changes  NOT consistent with MS. Needs to follow with primary care for management of vascular risk factors and obesity as white matter changes likely chronic microvascular changes. MRI of the cervical spine normal.   - For her occipital neuralgia she did not tolerate: Occipital nerve blocks, Trileptal. Medications not helping: Neurontin. Intolerant of Topamax in the past.  Will start Amitriptyline discussed all side effects as per patient instructions including weight gain, remote possibility of QT prolongation and heart problems, stop for anything concerning. Can email in 4-6 weeks and we can increase. May help with her chronic pain and insomnia as well - memory changes: highly unlikely neurodegenerative neurologic condition likely due to psychiatric issues, chronic pain, polypharmacy. Needs management of these other conditions. - Referral to her pain clinic for C2-C3 medial branch blocks or other procedures as clinically warranted for her occipital neuralgia. Will cc note to Dr. Rochele Pages.   CC: Dr. Rochele Pages   Cc: Dr. Tonia Ghent and Dr. Elaina Hoops, MD  [facility] [address] [place], [place] [address]  Phone [phone] Fax [phone]  A total of 30  minutes was spent face-to-face with this patient. Over half this time was spent on counseling patient on the occipital neuralgia diagnosis and different diagnostic and therapeutic options available.

## 2016-01-21 NOTE — Patient Instructions (Addendum)
Remember to drink plenty of fluid, eat healthy meals and do not skip any meals. Try to eat protein with a every meal and eat a healthy snack such as fruit or nuts in between meals. Try to keep a regular sleep-wake schedule and try to exercise daily, particularly in the form of walking, 20-30 minutes a day, if you can.   As far as your medications are concerned, I would like to suggest: Amitriptyline 10mg  at night. Watch for sedation if taking with Ambien or other sedating agents do not recommend.  - Discuss With Dr. Rochele Pages the possibility of neck injections for occipital neuralgia (Medial Branch Blocks c2-c3)  I would like to see you back in 4 months, sooner if we need to. Please call us with any interim questions, concerns, problems, updates or refill requests.   Our phone number is [phone]. We also have an after hours call service for urgent matters and there is a physician on-call for urgent questions. For any emergencies you know to call 911 or go to the nearest emergency room   Amitriptyline tablets What is this medicine? AMITRIPTYLINE (a mee TRIP ti leen) is used to treat depression. This medicine may be used for other purposes; ask your health care provider or pharmacist if you have questions. What should I tell my health care provider before I take this medicine? They need to know if you have any of these conditions: -an alcohol problem -asthma, difficulty breathing -bipolar disorder or schizophrenia -difficulty passing urine, prostate trouble -glaucoma -heart disease or previous heart attack -liver disease -over active thyroid -seizures -thoughts or plans of suicide, a previous suicide attempt, or family history of suicide attempt -an unusual or allergic reaction to amitriptyline, other medicines, foods, dyes, or preservatives -pregnant or trying to get pregnant -breast-feeding How should I use this medicine? Take this medicine by mouth with a drink of water. Follow  the directions on the prescription label. You can take the tablets with or without food. Take your medicine at regular intervals. Do not take it more often than directed. Do not stop taking this medicine suddenly except upon the advice of your doctor. Stopping this medicine too quickly may cause serious side effects or your condition may worsen. A special MedGuide will be given to you by the pharmacist with each prescription and refill. Be sure to read this information carefully each time. Talk to your pediatrician regarding the use of this medicine in children. Special care may be needed. Overdosage: If you think you have taken too much of this medicine contact a poison control center or emergency room at once. NOTE: This medicine is only for you. Do not share this medicine with others. What if I miss a dose? If you miss a dose, take it as soon as you can. If it is almost time for your next dose, take only that dose. Do not take double or extra doses. What may interact with this medicine? Do not take this medicine with any of the following medications: -arsenic trioxide -certain medicines used to regulate abnormal heartbeat or to treat other heart conditions -cisapride -droperidol -halofantrine -linezolid -MAOIs like Carbex, Eldepryl, Marplan, Nardil, and Parnate -methylene blue -other medicines for mental depression -phenothiazines like perphenazine, thioridazine and chlorpromazine -pimozide -probucol -procarbazine -sparfloxacin -St. John's Wort -ziprasidone This medicine may also interact with the following medications: -atropine and related drugs like hyoscyamine, scopolamine, tolterodine and others -barbiturate medicines for inducing sleep or treating seizures, like phenobarbital -cimetidine -disulfiram -ethchlorvynol -thyroid hormones such as  levothyroxine This list may not describe all possible interactions. Give your health care provider a list of all the medicines, herbs,  non-prescription drugs, or dietary supplements you use. Also tell them if you smoke, drink alcohol, or use illegal drugs. Some items may interact with your medicine. What should I watch for while using this medicine? Tell your doctor if your symptoms do not get better or if they get worse. Visit your doctor or health care professional for regular checks on your progress. Because it may take several weeks to see the full effects of this medicine, it is important to continue your treatment as prescribed by your doctor. Patients and their families should watch out for new or worsening thoughts of suicide or depression. Also watch out for sudden changes in feelings such as feeling anxious, agitated, panicky, irritable, hostile, aggressive, impulsive, severely restless, overly excited and hyperactive, or not being able to sleep. If this happens, especially at the beginning of treatment or after a change in dose, call your health care professional. Dennis Bast may get drowsy or dizzy. Do not drive, use machinery, or do anything that needs mental alertness until you know how this medicine affects you. Do not stand or sit up quickly, especially if you are an older patient. This reduces the risk of dizzy or fainting spells. Alcohol may interfere with the effect of this medicine. Avoid alcoholic drinks. Do not treat yourself for coughs, colds, or allergies without asking your doctor or health care professional for advice. Some ingredients can increase possible side effects. Your mouth may get dry. Chewing sugarless gum or sucking hard candy, and drinking plenty of water will help. Contact your doctor if the problem does not go away or is severe. This medicine may cause dry eyes and blurred vision. If you wear contact lenses you may feel some discomfort. Lubricating drops may help. See your eye doctor if the problem does not go away or is severe. This medicine can cause constipation. Try to have a bowel movement at least every  2 to 3 days. If you do not have a bowel movement for 3 days, call your doctor or health care professional. This medicine can make you more sensitive to the sun. Keep out of the sun. If you cannot avoid being in the sun, wear protective clothing and use sunscreen. Do not use sun lamps or tanning beds/booths. What side effects may I notice from receiving this medicine? Side effects that you should report to your doctor or health care professional as soon as possible: -allergic reactions like skin rash, itching or hives, swelling of the face, lips, or tongue -abnormal production of milk in females -breast enlargement in both males and females -breathing problems -confusion, hallucinations -fast, irregular heartbeat -fever with increased sweating -muscle stiffness, or spasms -pain or difficulty passing urine, loss of bladder control -seizures -suicidal thoughts or other mood changes -swelling of the testicles -tingling, pain, or numbness in the feet or hands -yellowing of the eyes or skin Side effects that usually do not require medical attention (report to your doctor or health care professional if they continue or are bothersome): -change in sex drive or performance -constipation or diarrhea -nausea, vomiting -weight gain or loss This list may not describe all possible side effects. Call your doctor for medical advice about side effects. You may report side effects to FDA at 1-800-FDA-1088. Where should I keep my medicine? Keep out of the reach of children. Store at room temperature between 20 and 25 degrees C (  68 and 77 degrees F). Throw away any unused medicine after the expiration date. NOTE: This sheet is a summary. It may not cover all possible information. If you have questions about this medicine, talk to your doctor, pharmacist, or health care provider.    2016, [employer]. (2011-09-06 13:50:32)

## 2016-02-13 ENCOUNTER — Encounter: Payer: Self-pay | Admitting: Neurology

## 2016-02-18 ENCOUNTER — Other Ambulatory Visit: Payer: Self-pay | Admitting: Neurology

## 2016-02-18 DIAGNOSIS — M5481 Occipital neuralgia: Secondary | ICD-10-CM

## 2016-02-18 MED ORDER — GABAPENTIN 300 MG PO CAPS: 600.0000 mg | ORAL_CAPSULE | Freq: Three times a day (TID) | ORAL | 0 refills | Status: DC

## 2016-02-18 MED ORDER — AMITRIPTYLINE HCL 25 MG PO TABS: 25.0000 mg | ORAL_TABLET | Freq: Every day | ORAL | 2 refills | Status: DC

## 2016-02-27 ENCOUNTER — Encounter: Payer: Self-pay | Admitting: Neurology

## 2016-02-27 ENCOUNTER — Other Ambulatory Visit: Payer: Self-pay | Admitting: Neurology

## 2016-02-27 DIAGNOSIS — M5481 Occipital neuralgia: Secondary | ICD-10-CM

## 2016-03-11 DIAGNOSIS — Z0271 Encounter for disability determination: Secondary | ICD-10-CM

## 2016-03-24 ENCOUNTER — Encounter: Payer: Self-pay | Admitting: *Deleted

## 2016-03-24 ENCOUNTER — Telehealth: Payer: Self-pay | Admitting: Neurology

## 2016-03-24 NOTE — Telephone Encounter (Signed)
Pt called stating her head is hurting really bad. She wants to know about increasing Gabapentin to 900mg  3 x daily. Please call [phone]

## 2016-03-24 NOTE — Telephone Encounter (Signed)
Spoke to patient - states her PCP prescribed her venlafaxine 75mg  daily x 1 week then increase to 75mg , BID.  She has been instructed to discontinue amitriptyline and I have called [facility] to void her remaining refills.  She is taking more gabapentin than prescribed - we instructed her to take 600mg , TID and she has been taking 900mg , TID.  States it is not helpful anyway.  She will see how the venlafaxine works.  No further prescription provided today from Dr. Jaynee Eagles.  She has a pending appt at [facility] on 04/05/16.

## 2016-03-26 IMAGING — CR DG ABDOMEN ACUTE W/ 1V CHEST
4 series · 4 of 4 positions shown · non-contrast
Comparison: Chest dated 04/27/2014, pelvic ultrasound dated
12/16/2014 and abdomen and pelvis CT dated 12/16/2014.

CLINICAL DATA: Right lower quadrant abdominal pain for the past 2
days.

EXAM:
DG ABDOMEN ACUTE W/ 1V CHEST

[w chest pa]
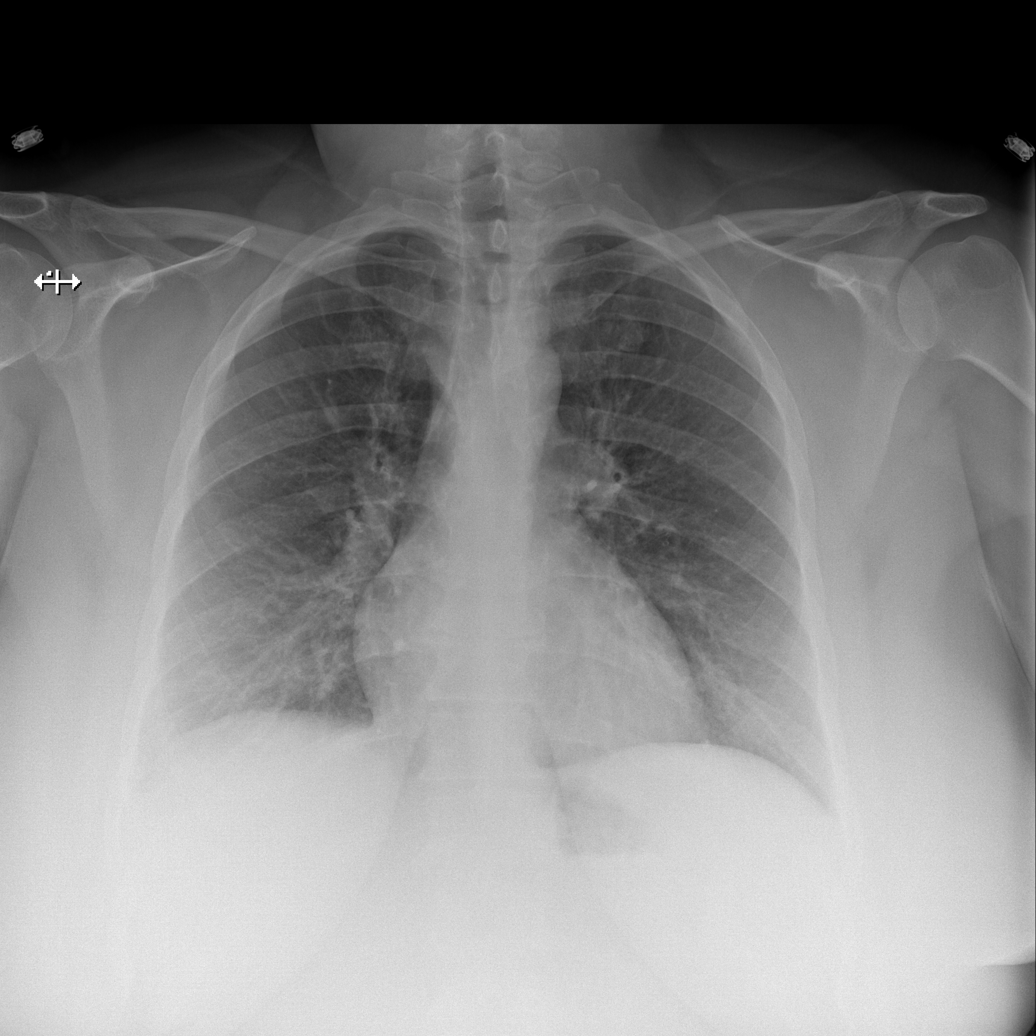

[w abdomen upright]
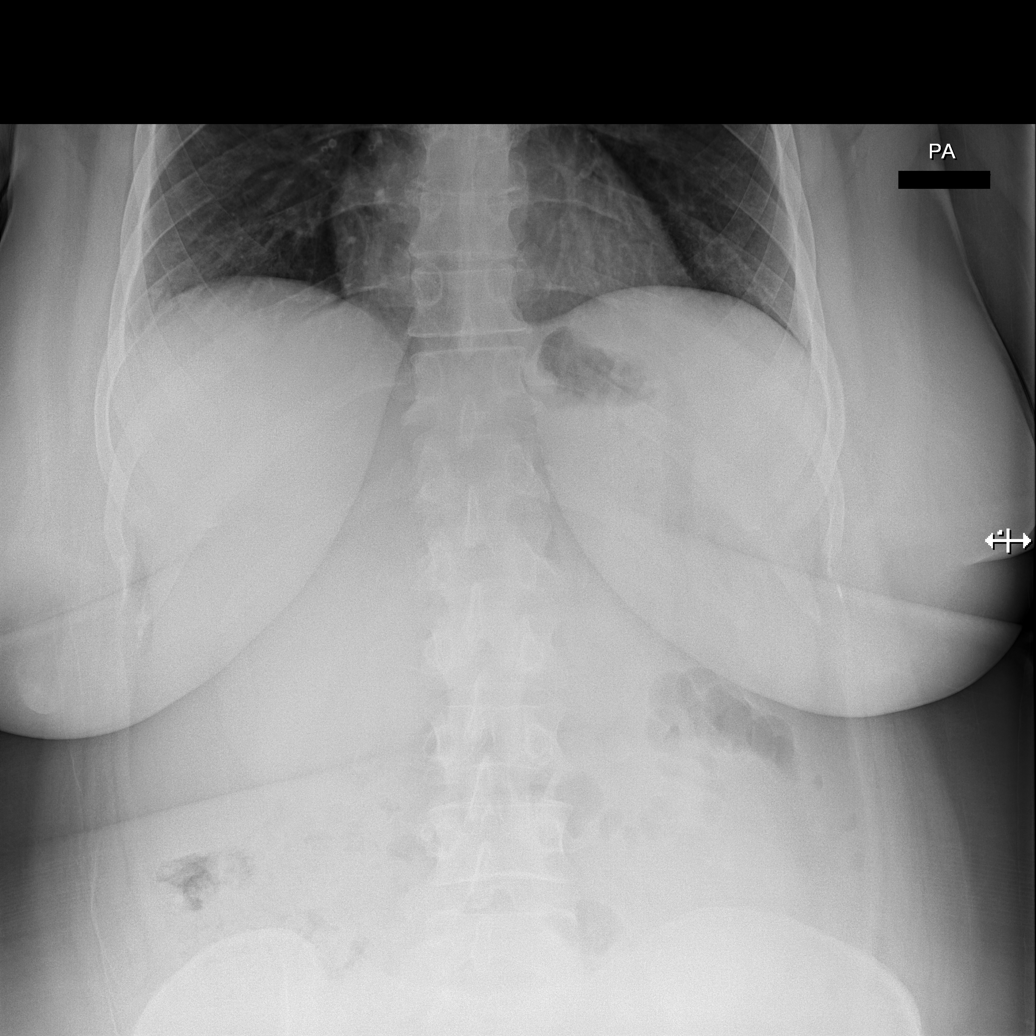

[t abdomen supine (1 of 2)]
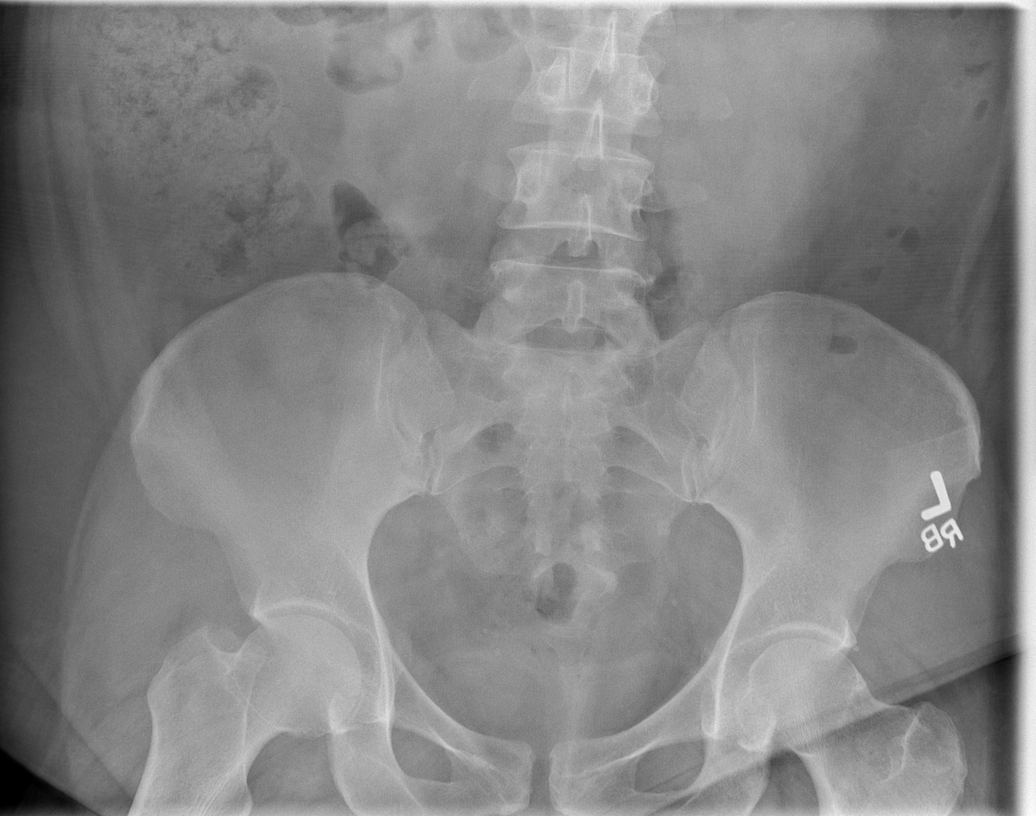

[t abdomen supine (2 of 2)]
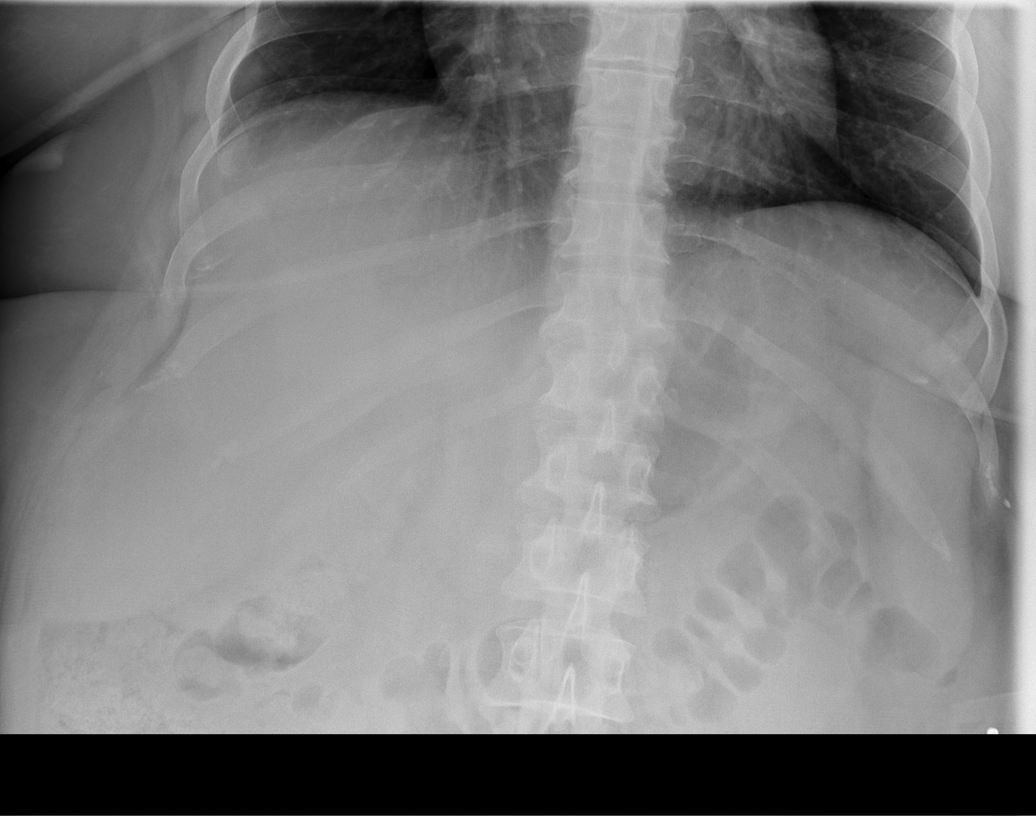

[4 of 4 positions shown; findings below may reference images not displayed]

FINDINGS: Normal sized heart. Clear lungs. Diffuse peribronchial thickening.
Normal bowel gas pattern without free peritoneal air. Small
bilateral pelvic phleboliths. The previously seen small renal
calculi are not radiographically visible. Mild lower thoracic spine
degenerative changes.
IMPRESSION: No acute abnormality.  Stable chronic bronchitic changes.

## 2016-03-30 NOTE — Telephone Encounter (Signed)
No patient does not need to see Cindy Hoffman. She needs to be seen in headache clinic. Absolutely she does not need [name] can you cancel. She needs headache clinic for occipital neuralgia. Can you call ASAP and cancel with [name]

## 2016-03-30 NOTE — Telephone Encounter (Signed)
Patient is calling stating she received a call from [facility] regarding her appointment on 04-05-16. She was told by that office she could not be seen there because of her diagnosis. Please call to discuss.

## 2016-03-30 NOTE — Telephone Encounter (Signed)
Called and spoke to [facility] and they scheduled Patient with Neuro surgery. I relayed No she needs to se  Dr. Sanda Klein . Patient is  scheduled for February  26 at 2:20 with Dr. Sanda Klein. Patient will not have to wait until then.  Dr. Hassell Done is going to review her records in next 24-48 to move her apt up.   I have called and spoke to patient with all details above she was crying the entire time I was on the phone. Patient understood all details. If patient has not hurd from [facility] she will call them at [phone] and then call me.

## 2016-03-30 NOTE — Telephone Encounter (Signed)
Noted, thank you Hinton Dyer!  Dr Jaynee Eagles- Juluis Rainier

## 2016-03-30 NOTE — Telephone Encounter (Signed)
Please refer to [facility] for refractory occipital neuralgia to se eif they have any other ideas, possibly gamma knife or some other procedure thanks. Please send all notes from neurology (myself and the NPs have seen her) including procedure notes as well as MRI reports of the brain and cervical spine thanks.   That's where I sent referral see your order above.  [facility] then sent to Dr. Neurosurgery . Dr. Salomon Fick From [facility] Relayed that they couldn't see Cindy Hoffman.  I have CX apt with Dr. Sanda Klein That was my mistake.  Please Place order for [facility] [place] [facility] and I will get her sent there. I will call Cindy Hoffman with Details. 03/30/2016. Thanks Dr. Jaynee Eagles

## 2016-03-30 NOTE — Telephone Encounter (Signed)
Dr Jaynee Eagles- please advise. How would you like to proceed?

## 2016-03-30 NOTE — Telephone Encounter (Signed)
Can yo ask dana to find out details please? thanks

## 2016-03-31 ENCOUNTER — Other Ambulatory Visit: Payer: Self-pay | Admitting: *Deleted

## 2016-03-31 DIAGNOSIS — M5481 Occipital neuralgia: Secondary | ICD-10-CM

## 2016-03-31 NOTE — Telephone Encounter (Signed)
Per Jayme Cloud, she is going to see if pt can get in within the next month. IF not, she will schedule with MM,NP

## 2016-03-31 NOTE — Telephone Encounter (Signed)
Called and spoke to patient she was fine . I relayed to patient that she will not being seeing Dr. Sanda Klein.  Relayed to Patient we are waiting on  Apt with [facility]. I apologized . Patient understood.  Patient has apt with [facility] 04/01/2016. Patient will arrive at 8:45 for 9:00 apt.   I called backed [facility] clinic they relayed to me they would have apt in next 48 hours. I relayed this to patient as well she understood.

## 2016-03-31 NOTE — Telephone Encounter (Signed)
Thank you Hinton Dyer, noted.

## 2016-03-31 NOTE — Progress Notes (Signed)
° ° °  I have called [facility] Headache clinic waiting for a call back. Nurse is reviewing to see when she can get patient in for Headache clinic.

## 2016-03-31 NOTE — Progress Notes (Signed)
Dr Ahern- FYI 

## 2016-03-31 NOTE — Telephone Encounter (Signed)
You can set patient up with a follow up with Cindy Hoffman if they can't see her in the next month, Hinton Dyer let me know. thanks

## 2016-04-01 ENCOUNTER — Encounter: Payer: Self-pay | Admitting: Adult Health

## 2016-04-01 ENCOUNTER — Ambulatory Visit (INDEPENDENT_AMBULATORY_CARE_PROVIDER_SITE_OTHER): Payer: Medicaid Other | Admitting: Adult Health

## 2016-04-01 VITALS — BP 128/79 | HR 72 | Wt 327.0 lb

## 2016-04-01 DIAGNOSIS — M5481 Occipital neuralgia: Secondary | ICD-10-CM

## 2016-04-01 NOTE — Patient Instructions (Signed)
Continue Amitriptyline and gabapentin Follow-up with Edgar If your symptoms worsen or you develop new symptoms please let us know.

## 2016-04-01 NOTE — Progress Notes (Signed)
PATIENT: Cindy Hoffman DOB: 09-21-1975  REASON FOR VISIT: follow up-occipital neuralgia HISTORY FROM: patient  HISTORY OF PRESENT ILLNESS: Today 04/01/2016: Cindy Hoffman is a 40 year old female with a history of occipital neuralgia. She returns today for follow-up. The patient has been referred to [facility] for occipital neuralgia. She has tried carbamazepine, Trileptal, Topamax, gabapentin, amitriptyline and nerve blocks with no relief. She is currently taking amitriptyline and gabapentin and reports that this has not offered any benefit for her neuralgia but it does help with her sleep. She states that the only relief she gets from her neuralgia is when she takes oxycodone for her back pain. She states that she normally gets 3-4 hours of relief after taking this medication. We discussed possibly sending the patient to physical therapy however she reports that she has done physical therapy in the past with no benefit. She states that the pain normally occurs in the right occipital region and will radiate to behind the right ear and into the right jaw. She states that the pain is usually continuous uness she takes oxycodone or is asleep. She returns today for an evaluation.   Interval History 01/31/2016:  She comes back for occipital neuralgia. MRi of the brain and cervical spine unremarkable. She takes regular tylenol and this can be causing rebound however patient is still taking regardless of our instructions not to take daily or more than 2-3x in a week. Repeat MRi of the brain and cervical cord to evaluate for abnormal white matter changes NOT consistent with MS. Needs to follow with primary care for management of vascular risk factors and obesity as white matter changes likely chronic microvascular changes. MRI of the cervical spine normal.  She does not take Ambien at night. She is taking muscle relaxer. She has insomnia. She has anxiety. She in on multiple medications.  She is seen at a pain clinic Dr. Rochele Pages at [facility].   - For her occipital neuralgia she did not tolerate: Occipital nerve blocks, Trileptal. Medications not helping: Neurontin. Intolerant of Topamax in the past.  -  Start Amitriptyline very low dose which may help with occipital neuralgia and headache, her insomnia and chronic pain  MRI brain 12/26/2015: IMPRESSION: This MRI of the brain with and without contrast shows the following: 1. A few scattered T2/FLAIR hyperintense changes in the subcortical white matter and some subtle periventricular white matter changes. This is a nonspecific finding and could represent early chronic microvascular ischemic change or be the sequela of migraine. Demyelination is less likely to give this pattern. 2. There were no acute findings and there is a normal enhancement pattern. When compared to the MRI dated 03/31/2015, there were no definite interval changes.  MRI cervical spine: normal  Cindy Hoffman:[id number] F Cindy Hoffman a 40 y.o.femalehere as a referral from Dr. Massie Kluver occipital neuralgia. Past medical history of migraine, morbid obesity, anxiety, fibromyalgia, hypothyroidism, asthma, disc degenerative disease. She was diagnosed with Occipital Neuralgia at [facility]. Symptoms started 02/28/2015 awoke with a loud rining in the ears. No inciting event or trauma. Since June 11 it has been daily and excruciating. It is burning, aching, throbbing on the right side (points to the occipital area). Her jaw and ear hurts. She has neck pain. She endorses numbness in the left and weakness in the arms. Her mouth went numb as well. MRi of the brain in 03/2015 with abnormal white matter periventricular. She also has migraines since 1997. She had abnormal white matter changes in the  brain in 03/2015. Severe 10/10 pain continuous. Worse at night. Vision changes. Headaches. Dizziness. Weakness in legs. Multiple neurologic symptoms for many years started when she was pregnant. No FHx MS  or autoimmune disease   Reviewed notes, labs and imaging from outside physicians, which showed:   BMP unremarkable with normal creatinine 0.75, cbc unremarkable  Personally reviewed images and agree with the following: At least 5 small FLAIR hyperintensities in the bilateral cerebral white matter with hazy periventricular hyperintensity around the frontal horn of the left lateral ventricle and the body/atrium of the right lateral ventricle. The hazy areas have no volume loss/mass effect or enhancement. No juxta cortical, corpus callosum, or infratentorial signal abnormality. No anterior temporal involvement. No historical indication of immunocompromised state/HIV.  IMPRESSION: 1. No explanation for hearing loss. 2. 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   REVIEW OF SYSTEMS: Out of a complete 14 system review of symptoms, the patient complains only of the following symptoms, and all other reviewed systems are negative.  Hearing loss, eye pain or double vision, blurred vision, ear pain, ringing in ears, cough, nausea, joint pain, back pain, aching muscles, walking difficulty, neck pain, neck stiffness, weakness, numbness, headache, dizziness, memory loss  ALLERGIES: Allergies  Allergen Reactions  . Beta Adrenergic Blockers Anaphylaxis    HOME MEDICATIONS: Outpatient Medications Prior to Visit  Medication Sig Dispense Refill  . albuterol (PROVENTIL HFA;VENTOLIN HFA) 108 (90 BASE) MCG/ACT inhaler Inhale 1 puff into the lungs every 6 (six) hours as needed for wheezing or shortness of breath.    Marland Kitchen albuterol (PROVENTIL) (2.5 MG/3ML) 0.083% nebulizer solution Take 2.5 mg by nebulization every 6 (six) hours as needed for wheezing or shortness of breath.    . furosemide (LASIX) 20 MG tablet Take 2 tablets (40 mg total) by mouth daily. (Patient taking differently: Take 40 mg by mouth daily as  needed for fluid. ) 20 tablet 0  . gabapentin (NEURONTIN) 300 MG capsule Start one pill three times a day. Can increase to 2 pills twice daily in one week if needed. Watch for somnolence. (Patient taking differently: Take 600 mg by mouth 2 (two) times daily. Start one pill three times a day. Can increase to 2 pills twice daily in one week if needed. Watch for somnolence.) 180 capsule 11  . gabapentin (NEURONTIN) 300 MG capsule Take 2 capsules (600 mg total) by mouth 3 (three) times daily. 126 capsule 0  . LORazepam (ATIVAN) 1 MG tablet Take 1 mg by mouth every 8 (eight) hours as needed for anxiety.    . Lorcaserin HCl 10 MG TABS Take 10 mg by mouth 2 (two) times daily.    . mometasone-formoterol (DULERA) 200-5 MCG/ACT AERO Inhale 2 puffs into the lungs 2 (two) times daily.    Marland Kitchen oxyCODONE-acetaminophen (PERCOCET/ROXICET) 5-325 MG per tablet Take 1-2 tablets by mouth every 6 (six) hours as needed for severe pain. (Patient taking differently: Take 1 tablet by mouth every 12 (twelve) hours. ) 6 tablet 0  . promethazine (PHENERGAN) 25 MG tablet Take 25 mg by mouth every 4 (four) hours as needed for nausea or vomiting.    Marland Kitchen tiZANidine (ZANAFLEX) 4 MG capsule Take 4 mg by mouth daily.    Marland Kitchen zolpidem (AMBIEN) 10 MG tablet Take 10 mg by mouth at bedtime as needed for sleep.    Marland Kitchen venlafaxine (EFFEXOR) 75 MG tablet Take 75 mg by mouth 2 (two) times daily.  No facility-administered medications prior to visit.     PAST MEDICAL HISTORY: Past Medical History:  Diagnosis Date  . Anxiety   . Anxiety   . Asthma   . Chronic pain syndrome   . Complication of anesthesia    "STATES WAKES UP AND CANT BREATHE AND NEEDS BREATHING TREATMENT  IN PACU"  . Dysrhythmia    palpitations- "from thyroid"  . Fibromyalgia   . Fracture of right foot   . GERD (gastroesophageal reflux disease)   . Migraine   . Myofacial muscle pain   . Obesity   . OSA (obstructive sleep apnea)    Has CPAP  . Sleep apnea     NO  CPAP--could not tolerates mask  . Thyroid disease   . Viral respiratory infection 06/26/13   ? influenza    PAST SURGICAL HISTORY: Past Surgical History:  Procedure Laterality Date  . NASAL SEPTUM SURGERY    . THYROID LOBECTOMY Right 07/19/2013   Procedure: THYROID LOBECTOMY;  Surgeon: Harl Bowie, MD;  Location: [facility];  Service: General;  Laterality: Right;  . TUBAL LIGATION    . WISDOM TOOTH EXTRACTION      FAMILY HISTORY: Family History  Problem Relation Age of Onset  . Hypertension Mother   . Hypertension Father   . Diabetes Sister   . Cancer Paternal Grandmother     breast    SOCIAL HISTORY: Social History   Social History  . Marital status: Legally Separated    Spouse name: N/A  . Number of children: 1  . Years of education: GED   Occupational History  . unemployed    Social History Main Topics  . Smoking status: Light Tobacco Smoker    Packs/day: 0.25    Years: 0.00    Types: Cigarettes  . Smokeless tobacco: Never Used  . Alcohol use No     Comment: socially  . Drug use: No  . Sexual activity: Not on file   Other Topics Concern  . Not on file   Social History Narrative   Lives with father   Caffeine use: none      PHYSICAL EXAM  Vitals:   04/01/16 0901  BP: 128/79  Pulse: 72  Weight: (!) 327 lb (148.3 kg)   Body mass index is 56.13 kg/m.  Generalized: Well developed, in no acute distress,Obese   Neurological examination  Mentation: Alert oriented to time, place, history taking. Follows all commands speech and language fluent Cranial nerve II-XII: Pupils were equal round reactive to light. Extraocular movements were full, visual field were full on confrontational test. Facial sensation and strength were normal. Uvula tongue midline. Head turning and shoulder shrug  were normal and symmetric. Motor: The motor testing reveals 5 over 5 strength of all 4 extremities. Good symmetric motor tone is noted throughout.  Sensory: Sensory  testing is intact to soft touch on all 4 extremities. No evidence of extinction is noted.  Coordination: Cerebellar testing reveals good finger-nose-finger and heel-to-shin bilaterally.  Gait and station: Gait is normal. Tandem gait is normal. Romberg is negative. No drift is seen.  Reflexes: Deep tendon reflexes are symmetric and normal bilaterally.   DIAGNOSTIC DATA (LABS, IMAGING, TESTING) - I reviewed patient records, labs, notes, testing and imaging myself where available.  Lab Results  Component Value Date   WBC 7.6 10/12/2015   HGB 12.6 10/12/2015   HCT 40.2 10/12/2015   MCV 84.5 10/12/2015   PLT 352 10/12/2015      Component  Value Date/Time   NA 136 10/12/2015 2340   K 4.6 10/12/2015 2340   CL 103 10/12/2015 2340   CO2 23 10/12/2015 2340   GLUCOSE 122 (H) 10/12/2015 2340   BUN 8 10/12/2015 2340   CREATININE 0.75 10/12/2015 2340   CALCIUM 8.7 (L) 10/12/2015 2340   PROT 7.6 01/04/2015 1338   ALBUMIN 3.7 01/04/2015 1338   AST 14 (L) 01/04/2015 1338   ALT 12 (L) 01/04/2015 1338   ALKPHOS 78 01/04/2015 1338   BILITOT 0.3 01/04/2015 1338   GFRNONAA >60 10/12/2015 2340   GFRAA >60 10/12/2015 2340    ASSESSMENT AND PLAN 40 y.o. year old female  has a past medical history of Anxiety; Anxiety; Asthma; Chronic pain syndrome; Complication of anesthesia; Dysrhythmia; Fibromyalgia; Fracture of right foot; GERD (gastroesophageal reflux disease); Migraine; Myofacial muscle pain; Obesity; OSA (obstructive sleep apnea); Sleep apnea; Thyroid disease; and Viral respiratory infection (06/26/13). here with:  1. Occipital neuralgia  Overall the patient symptoms have remained the same. She continues to have pain daily-relief she receives this with oxycodone on her when she is asleep. She has tried and failed multiple medications including Trileptal, carbamazepine, Topamax, amitriptyline, gabapentin and nerve blocks. She reports she also tried physical therapy with no benefit. At this time I  feel that her best option would be a referral to [facility] for an evaluation. She will continue on amitriptyline and gabapentin at this time. She will follow-up in 6 months or sooner if needed.   Ward Givens, MSN, NP-C 04/01/2016, 9:13 AM [facility] [address] [place], [place] [address] [phone]

## 2016-04-05 ENCOUNTER — Telehealth: Payer: Self-pay | Admitting: Neurology

## 2016-04-05 NOTE — Telephone Encounter (Signed)
She was fine when she saw Megan, unclear why she keeps crying there seems to be an element of exaggeration thanks

## 2016-04-05 NOTE — Telephone Encounter (Signed)
Cindy Hoffman from [facility] and relayed. Doctor's have reviewed Patient's records and patient can be seen first  Avab. February 7th  arrive at 1:00 for 1:15 [facility] .  Patient will see Dr. Bluford Main .  AV:[id number] . Patient is also on CX list.   I have called and spoke to patient relayed her apt time and details of apt at [facility] . Patient understood but she cried when I told her apt. Time .  I also reassured patient she is on CX list.  Also explained to patient that she will need to take her MRI disc which she has a copy of.

## 2016-04-16 NOTE — Progress Notes (Signed)
Personally  participated in and made any corrections needed to history, physical, neuro exam,assessment and plan as stated above. I agree with findings and plan.  Sarina Ill, MD [facility]

## 2016-05-03 DIAGNOSIS — K259 Gastric ulcer, unspecified as acute or chronic, without hemorrhage or perforation: Secondary | ICD-10-CM

## 2016-05-03 HISTORY — PX: GASTRIC ROUX-EN-Y: SHX5262

## 2016-05-03 HISTORY — DX: Gastric ulcer, unspecified as acute or chronic, without hemorrhage or perforation: K25.9

## 2016-05-12 ENCOUNTER — Telehealth: Payer: Self-pay | Admitting: Neurology

## 2016-05-12 NOTE — Telephone Encounter (Signed)
Pt request refill for tiZANidine (ZANAFLEX) 4 MG capsule ,amitriptyline (ELAVIL) 25 MG tablet and gabapentin 300mg - taking 900mg  tid. [facility]

## 2016-05-12 NOTE — Telephone Encounter (Signed)
Dr Jaynee Eagles- are you ok with this?

## 2016-05-13 MED ORDER — TIZANIDINE HCL 4 MG PO CAPS: 4.0000 mg | ORAL_CAPSULE | Freq: Every day | ORAL | 3 refills | Status: DC

## 2016-05-13 MED ORDER — AMITRIPTYLINE HCL 25 MG PO TABS: 25.0000 mg | ORAL_TABLET | Freq: Every day | ORAL | 3 refills | Status: DC

## 2016-05-13 MED ORDER — GABAPENTIN 300 MG PO CAPS: 900.0000 mg | ORAL_CAPSULE | Freq: Three times a day (TID) | ORAL | 3 refills | Status: DC

## 2016-05-13 NOTE — Telephone Encounter (Signed)
Sent in refill requests to pt pharmacy as requested. Ok per AA,MD.

## 2016-05-13 NOTE — Telephone Encounter (Signed)
That's fine. thanks

## 2016-05-25 ENCOUNTER — Ambulatory Visit: Payer: Medicaid Other | Admitting: Adult Health

## 2016-06-03 HISTORY — PX: GALLBLADDER SURGERY: SHX652

## 2016-06-08 DIAGNOSIS — Z0271 Encounter for disability determination: Secondary | ICD-10-CM

## 2016-07-16 ENCOUNTER — Telehealth: Payer: Self-pay

## 2016-07-16 MED ORDER — TIZANIDINE HCL 4 MG PO TABS: 4.0000 mg | ORAL_TABLET | Freq: Every day | ORAL | 11 refills | Status: DC

## 2016-07-16 NOTE — Telephone Encounter (Signed)
Called [employer] to initiate PA. Ref # [id number]. Plan covers tablets but not capsules. New rx e-scribed to pharmacy for tabs.

## 2016-08-19 ENCOUNTER — Other Ambulatory Visit: Payer: Self-pay | Admitting: Neurology

## 2016-08-19 ENCOUNTER — Telehealth: Payer: Self-pay | Admitting: Neurology

## 2016-08-19 MED ORDER — TIZANIDINE HCL 4 MG PO TABS: 4.0000 mg | ORAL_TABLET | Freq: Three times a day (TID) | ORAL | 11 refills | Status: DC | PRN

## 2016-08-19 MED ORDER — AMITRIPTYLINE HCL 25 MG PO TABS: 50.0000 mg | ORAL_TABLET | Freq: Every day | ORAL | 6 refills | Status: DC

## 2016-08-19 NOTE — Telephone Encounter (Signed)
Pt called Cindy Hoffman (ELAVIL) 25 MG tablet(Pt said Dr Micheline Chapman told her to start taking 28) she is now out of this medication and has not reached out to this Dr Dimas Millin to see if he would write her a revised perscription  re: this medication ) and tiZANidine (ZANAFLEX) 4 MG tablet Pt said that Dr Guillermina City had her on 30 mg and now Dr Jaynee Eagles just has her on 4mg .  She'd like to discuss the medications and dosages before she submits request to refill.  Please call

## 2016-08-19 NOTE — Telephone Encounter (Signed)
Patient was last seen by [facility] in Nov for occipital neuralgia and has a follow-up scheduled w/ Dr. Jaynee Eagles at the end of May. She reported daily pain only relieved by oxycodone and going to sleep. She has tried and failed multiple medications including Trileptal, carbamazepine, Topamax, amitriptyline, gabapentin, nerve blocks and said she also tried physical therapy w/o benefit. She was referred to [facility] for further evaluation. Instructed to continue amitriptyline (25 mg qhs) and gabapentin (900 mg TID). She also has tizanidine (4 mg) daily ordered.

## 2016-08-19 NOTE — Telephone Encounter (Signed)
I'm fine with the increase but she has to be careful, Elavil may interact with Venlafaxine so I would not increase it anymore. Please warn her about Seratonin Syndrome and need for ED if she experiences any symptoms. Watch ou for sedation on Tizanidine. Both these medications can cause weight gain and patient is morbidly obese, she should consider this side effects. I ordered it. She really needs to go to [facility], I'm not sure I have anything more to offer her please let her know and make sure she scheduled with them thanks.

## 2016-08-23 MED ORDER — TIZANIDINE HCL 4 MG PO TABS: 4.0000 mg | ORAL_TABLET | Freq: Three times a day (TID) | ORAL | 1 refills | Status: DC | PRN

## 2016-08-23 MED ORDER — AMITRIPTYLINE HCL 25 MG PO TABS: 50.0000 mg | ORAL_TABLET | Freq: Every day | ORAL | 1 refills | Status: DC

## 2016-08-23 NOTE — Addendum Note (Signed)
Addended by: Monte Fantasia on: 08/23/2016 05:00 PM   Modules accepted: Orders

## 2016-08-23 NOTE — Telephone Encounter (Signed)
Scripts re-sent to corrected pharmacy.

## 2016-08-23 NOTE — Telephone Encounter (Signed)
Called pt back w/ new dose instructions and side effects. Also gave recommendations to go ER if any s/s of serotonin syndrome and to please consult w/ [facility] for occiptal neuralgia. May call back w/ any questions/concerns.

## 2016-08-23 NOTE — Telephone Encounter (Signed)
Patient called office in reference to medications that were phoned into [facility], needing to be switched to [facility].

## 2016-09-29 ENCOUNTER — Encounter: Payer: Self-pay | Admitting: Neurology

## 2016-09-29 ENCOUNTER — Ambulatory Visit (INDEPENDENT_AMBULATORY_CARE_PROVIDER_SITE_OTHER): Payer: Medicaid Other | Admitting: Neurology

## 2016-09-29 DIAGNOSIS — G43719 Chronic migraine without aura, intractable, without status migrainosus: Secondary | ICD-10-CM

## 2016-09-29 NOTE — Progress Notes (Signed)
[facility]    Provider: Dr Jaynee Eagles Referring Provider: Lauraine Rinne Primary Care Physician: Berkley Harvey, NP  CC: Chronic intractable migraines without status migrainosus no aura  Interval history 09/29/2016: Patient has a past medical history of chronic headache and neck pain, morbid obesity, asthma, hypothyroidism, anxiety, status post recent bariatric surgery, chronic low back pain. She describes having migraines for about 20 years. She is working with Dr. Rochele Pages in pain management for chronic back pain.  MRI of the brain and cervical spine did not show any etiology for her symptoms. She's had multiple nerve blocks without results. She was sent to [facility] but did not want to pursue the stimulator. Based on her diffuse pain multiple laboratory studies and rheumatologic markers were ordered. [facility] also suggested fibromyalgia. Given her chronic history of migraine headaches could also try other medications such as Depakote, verapamil, diltiazem or Botox. Her amitriptyline was increased at [facility] up to 50 mg.  Patient has chronic migraines. No medication overuse. She's failed multiple medications as listed below. She continues to have daily headaches with at least 15 migraines monthly. This frequency ongoing for over a year and longer. Patient's headaches are right-sided, throbbing pounding, she has light sensitivity and sound sensitivity, she has vomited in the past. No aura. Migraines can last up to 24 hours or some days longer. On average it lasts at least 12 hours. Can be severe pain. She is very tired of suffering an she's tried everything. She has recently lost 70 pounds in hopes that this will help with her migraines but has not. Today I recommended Botox therapy. I do feel that patient would be a good candidate.  Patient reports memory changes, she forgets things midsentence, has to write things down. I reassured patient that she does not have a  degenerative disease such as Alzheimer's, her memory complaints are likely due to chronic pain, normal aging and polypharmacy.  ANA screen negative, sedimentation rate 38, Sjogren's negative  Medications tried: Trileptal. Topiramate. Neurontin. Amitriptyline. Trileptal. Zanaflex. Effexor. She cannot take beta blockers or fluoxetine due to asthma.  Interval History 01/31/2016:  She comes back for occipital neuralgia and migraines. MRi of the brain and cervical spine unremarkable. She takes regular tylenol and this can be causing rebound however patient is still taking regardless of our instructions not to take daily or more than 2-3x in a week. Repeat MRi of the brain and cervical cord to evaluate for abnormal white matter changes NOT consistent with MS. Needs to follow with primary care for management of vascular risk factors and obesity as white matter changes likely chronic microvascular changes. MRI of the cervical spine normal.  She does not take Ambien at night. She is taking muscle relaxer. She has insomnia. She has anxiety. She in on multiple medications. She is seen at a pain clinic Dr. Rochele Pages at [facility].   - For her occipital neuralgia she did not tolerate: Occipital nerve blocks, Trileptal. Medications not helping: Neurontin. Intolerant of Topamax in the past.  -  Start Amitriptyline very low dose which may help with occipital neuralgia and headache, her insomnia and chronic pain  MRI brain 12/26/2015: IMPRESSION: This MRI of the brain with and without contrast shows the following: 1. A few scattered T2/FLAIR hyperintense changes in the subcortical white matter and some subtle periventricular white matter changes. This is a nonspecific finding and could represent early chronic microvascular ischemic change or be the sequela of migraine. Demyelination is less likely to give  this pattern. 2. There were no acute findings and there is a normal enhancement pattern. When compared to the MRI  dated 03/31/2015, there were no definite interval changes.  MRI cervical spine: normal  PIR:JJOACZY F Horneis a 41 y.o.femalehere as a referral from Dr. Massie Kluver occipital neuralgia. Past medical history of migraine, morbid obesity, anxiety, fibromyalgia, hypothyroidism, asthma, disc degenerative disease. She was diagnosed with Occipital Neuralgia at [facility]. Symptoms started 02/28/2015 awoke with a loud rining in the ears. No inciting event or trauma. Since June 11 it has been daily and excruciating. It is burning, aching, throbbing on the right side (points to the occipital area). Her jaw and ear hurts. She has neck pain. She endorses numbness in the left and weakness in the arms. Her mouth went numb as well. MRi of the brain in 03/2015 with abnormal white matter periventricular. She also has migraines since 1997. She had abnormal white matter changes in the brain in 03/2015. Severe 10/10 pain continuous. Worse at night. Vision changes. Headaches. Dizziness. Weakness in legs. Multiple neurologic symptoms for many years started when she was pregnant. No FHx MS or autoimmune disease   Reviewed notes, labs and imaging from outside physicians, which showed:   BMP unremarkable with normal creatinine 0.75, cbc unremarkable  Personally reviewed images and agree with the following: At least 5 small FLAIR hyperintensities in the bilateral cerebral white matter with hazy periventricular hyperintensity around the frontal horn of the left lateral ventricle and the body/atrium of the right lateral ventricle. The hazy areas have no volume loss/mass effect or enhancement. No juxta cortical, corpus callosum, or infratentorial signal abnormality. No anterior temporal involvement. No historical indication of immunocompromised state/HIV.  IMPRESSION: 1. No explanation for hearing loss. 2. 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  Review of Systems: Patient complains of symptoms per HPI as well as the following symptomsfatigue, blurred vision, eye pain, swelling in legs, asthma,, ringing in ears, spinning sensation, joint pain, cramps, aching muscles, memory loss, confusion, headache, numbness, weakness, dizziness, insomnia, sleepiness, snoring, anxiety, not enough sleep, decreased energy, disinterest in activities: . Pertinent negatives per HPI. All others negative.    Social History   Social History  . Marital status: Legally Separated    Spouse name: N/A  . Number of children: 1  . Years of education: GED   Occupational History  . unemployed    Social History Main Topics  . Smoking status: Former Smoker    Packs/day: 0.00    Years: 0.00    Types: Cigarettes    Quit date: 11/2015  . Smokeless tobacco: Never Used  . Alcohol use No     Comment: socially  . Drug use: No  . Sexual activity: Not on file   Other Topics Concern  . Not on file   Social History Narrative   Lives with father   Caffeine use: none    Family History  Problem Relation Age of Onset  . Hypertension Mother   . Hypertension Father   . Diabetes Sister   . Cancer Paternal Grandmother        breast    Past Medical History:  Diagnosis Date  . Anxiety   . Asthma   . Chronic pain syndrome   . Complication of anesthesia    "STATES WAKES UP AND CANT BREATHE AND NEEDS BREATHING TREATMENT  IN PACU"  . Dysrhythmia    palpitations- "from thyroid"  . Fibromyalgia   .  Fracture of right foot   . GERD (gastroesophageal reflux disease)   . Migraine   . Myofacial muscle pain   . Obesity   . OSA (obstructive sleep apnea)    Has CPAP  . Sleep apnea     NO CPAP--could not tolerates mask  . Thyroid disease   . Viral respiratory infection 06/26/13   ? influenza    Past Surgical History:  Procedure Laterality Date  . GALLBLADDER SURGERY  06/2016  . 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 NASAL SEPTUM SURGERY    .  THYROID LOBECTOMY Right 07/19/2013   Procedure: THYROID LOBECTOMY;  Surgeon: Harl Bowie, MD;  Location: [facility];  Service: General;  Laterality: Right;  . TUBAL LIGATION    . WISDOM TOOTH EXTRACTION      Current Outpatient Prescriptions  Medication Sig Dispense Refill  . albuterol (PROVENTIL HFA;VENTOLIN HFA) 108 (90 BASE) MCG/ACT inhaler Inhale 1 puff into the lungs every 6 (six) hours as needed for wheezing or shortness of breath.    Marland Kitchen albuterol (PROVENTIL) (2.5 MG/3ML) 0.083% nebulizer solution Take 2.5 mg by nebulization every 6 (six) hours as needed for wheezing or shortness of breath.    Marland Kitchen amitriptyline (ELAVIL) 25 MG tablet Take 2 tablets (50 mg total) by mouth at bedtime. 60 tablet 1  . dicyclomine (BENTYL) 20 MG tablet Take 20 mg by mouth.    . furosemide (LASIX) 20 MG tablet Take 2 tablets (40 mg total) by mouth daily. (Patient taking differently: Take 40 mg by mouth daily as needed for fluid. ) 20 tablet 0  . Gabapentin 300 MG/6ML SOLN Take 900 mg by mouth.    . levothyroxine (SYNTHROID, LEVOTHROID) 75 MCG tablet Take 75 mcg by mouth daily.    Marland Kitchen LORazepam (ATIVAN) 1 MG tablet Take 1 mg by mouth every 8 (eight) hours as needed for anxiety.    . mometasone-formoterol (DULERA) 200-5 MCG/ACT AERO Inhale 2 puffs into the lungs 2 (two) times daily.    Marland Kitchen oxyCODONE-acetaminophen (PERCOCET/ROXICET) 5-325 MG per tablet Take 1-2 tablets by mouth every 6 (six) hours as needed for severe pain. (Patient taking differently: Take 1 tablet by mouth every 8 (eight) hours as needed. ) 6 tablet 0  . pantoprazole (PROTONIX) 40 MG tablet Take 40 mg by mouth 2 (two) times daily.     . promethazine (PHENERGAN) 25 MG tablet Take 25 mg by mouth every 4 (four) hours as needed for nausea or vomiting.    Marland Kitchen tiZANidine (ZANAFLEX) 4 MG tablet Take 1 tablet (4 mg total) by mouth every 8 (eight) hours as needed for muscle spasms. 60 tablet 1  . venlafaxine (EFFEXOR) 75 MG tablet Take 75 mg by mouth 2 (two) times  daily.    Marland Kitchen zolpidem (AMBIEN) 10 MG tablet Take 10 mg by mouth at bedtime as needed for sleep.     No current facility-administered medications for this visit.     Allergies as of 09/29/2016 - Review Complete 09/29/2016  Allergen Reaction Noted  . Beta adrenergic blockers Anaphylaxis 12/02/2012  . Nsaids  06/07/2016    Vitals: BP (!) 146/75   Pulse 76   Ht 5' 4.5" (1.638 m)   Wt 269 lb (122 kg)   BMI 45.46 kg/m  Last Weight:  Wt Readings from Last 1 Encounters:  09/29/16 269 lb (122 kg)   Last Height:   Ht Readings from Last 1 Encounters:  09/29/16 5' 4.5" (1.638 m)     Physical exam: Exam: Gen:  NAD, conversant, well nourised, morbidly obese, well groomed  Eyes: Conjunctivae clear without exudates or hemorrhage  Neuro: Detailed Neurologic Exam  Speech: Speech is normal; fluent and spontaneous with normal comprehension.  Cognition: The patient is oriented to person, place, and time;  recent and remote memory intact;  language fluent;  normal attention, concentration,  fund of knowledge Cranial Nerves: The pupils are equal, round, and reactive to light. The fundi are normal and spontaneous venous pulsations are present. Visual fields are full to finger confrontation. Extraocular movements are intact. Trigeminal sensation is intact and the muscles of mastication are normal. The face is symmetric. The palate elevates in the midline. Hearing intact. Voice is normal. Shoulder shrug is normal. The tongue has normal motion without fasciculations.     Gait: Wide based due to extremely large body habitus  Motor Observation:  No asymmetry, no atrophy, and no involuntary movements noted. Tone: Normal muscle tone.   Posture: Posture is normal. normal erect  Strength:mild prox left arm and leg weakness.otherwsie strength is V/V in the upper and lower limbs.    Reflex Exam:  DTR's: absent AJs otherwise  deep tendon reflexes in the upper and lower extremities are normal bilaterally.  Toes: The toes are downgoing bilaterally.  Clonus: Clonus is absent.    Assessment/Plan:41 y.o. femalehere as a referral from Dr. Massie Kluver chronic migraines and occipital neuralgia. She has the long history of refractory migraines. Also.Diagnosed at [facility] with occipital neuralgia.   Past medical history of migraine, morbid obesity, anxiety, fibromyalgia, hypothyroidism, asthma, disc degenerative disease. Multiple neurologic complaints.  - Medications tried: Trileptal. Topiramate. Neurontin. Amitriptyline. Trileptal. Zanaflex. Effexor. She cannot take beta blockers or fluoxetine due to asthma.  - Repeat MRi of the brain and cervical cord to evaluate for abnormal white matter changes NOT consistent with MS. Needs to follow with primary care for management of vascular risk factors and obesity as white matter changes likely chronic microvascular changes. MRI of the cervical spine normal.   - memory changes: highly unlikely neurodegenerative neurologic condition likely due to psychiatric issues, chronic pain, polypharmacy. Needs management of these other conditions.  - Patient would be a good candidate for Botox as she has tried and failed multiple medications and continues to have chronic intractable migraines without aura and without status migrainosus. We also discussed the new CGRP class of medications. At this time we'll go forward with Botox.  - weight loss: recent bariatric surgery has lost 70 pounds, obesity is correlated with worsening migraines. Discussed this.  Cc: Dr. Tonia Ghent and Dr. Elaina Hoops, MD  [facility] [address] [place], [place] [address]  Phone [phone] Fax [phone]  A total of 30 minutes was spent face-to-face with this patient. Over half this time was spent on counseling patient on the chronic migraine diagnosis and  different diagnostic and therapeutic options available.

## 2016-10-11 ENCOUNTER — Ambulatory Visit: Payer: Medicaid Other | Admitting: Neurology

## 2016-10-12 ENCOUNTER — Telehealth: Payer: Self-pay | Admitting: Neurology

## 2016-10-12 ENCOUNTER — Other Ambulatory Visit: Payer: Self-pay | Admitting: Neurology

## 2016-10-12 MED ORDER — TIZANIDINE HCL 4 MG PO TABS: ORAL_TABLET | ORAL | 6 refills | Status: DC

## 2016-10-12 NOTE — Telephone Encounter (Signed)
I increased it and called patient. Advised I incresed to up to 8mg  3x a day. The max is 36mg  a day but I don;t recommend taking that. I recommend max 24mg  a day and not every day, just as needed. Can cause sedation and memory changes.  thanks

## 2016-10-12 NOTE — Telephone Encounter (Signed)
Pt said former neurologist had her on 32mg  of tiZANidine (ZANAFLEX)  only as needed.   The 4 mg(according to pt is not enough) pt requesting an increase as far as 8 mg  4 times a day(as needed) please call

## 2016-10-25 ENCOUNTER — Other Ambulatory Visit: Payer: Self-pay | Admitting: Neurology

## 2016-11-17 DIAGNOSIS — Z0271 Encounter for disability determination: Secondary | ICD-10-CM

## 2017-01-05 ENCOUNTER — Encounter: Payer: Self-pay | Admitting: Neurology

## 2017-01-05 ENCOUNTER — Telehealth: Payer: Self-pay | Admitting: Neurology

## 2017-01-05 NOTE — Telephone Encounter (Signed)
Andee Poles, can you look into patient's botox request? Was it denied? Thanks!

## 2017-01-06 ENCOUNTER — Telehealth: Payer: Self-pay | Admitting: *Deleted

## 2017-01-06 NOTE — Telephone Encounter (Signed)
Spoke to pt and made appt for 01-11-17 at 1630.

## 2017-01-06 NOTE — Telephone Encounter (Signed)
Spoke with pt and made appt for her Tuesday, 01-11-17 at 1630, be here 1615.  She verbalized understanding.

## 2017-01-11 ENCOUNTER — Ambulatory Visit: Payer: Self-pay | Admitting: Neurology

## 2017-01-12 NOTE — Telephone Encounter (Signed)
Let her know we cann ot approve it, we tried twice. Aimovig is the next alternative have her set up a follow up with me thanks

## 2017-01-12 NOTE — Telephone Encounter (Signed)
I resubmitted this patient for an approval for the second time. It was again, denied. I called to inquire about the denial and was told by a rep that she has not met the requirement of trying and failing 3 drug classes for 3 months or more. She said if we wanted to appeal this decision we would have to submit written information that she was on the therapies for at least three months. Please advise me how you would like for me to proceed.

## 2017-01-17 NOTE — Telephone Encounter (Signed)
Patient hasn;t been seen since May, have her follow up in the office with me or with megan if she has something sooner to discuss thanks

## 2017-01-17 NOTE — Telephone Encounter (Signed)
I spoke with the patient today, she is ok to try Aimovig, would like a call regarding getting started on this. Please call and advise.

## 2017-01-18 NOTE — Telephone Encounter (Signed)
Left detailed message on voicemail (ok per DPR).  Requested her to return the call to schedule an appt with Dr. Jaynee Eagles or Jinny Blossom.

## 2017-01-27 ENCOUNTER — Telehealth: Payer: Self-pay | Admitting: Neurology

## 2017-01-27 ENCOUNTER — Ambulatory Visit: Payer: Self-pay | Admitting: Neurology

## 2017-01-27 NOTE — Telephone Encounter (Signed)
PA has been denied. Patient will not be receiving botox.

## 2017-01-27 NOTE — Telephone Encounter (Signed)
Sabrina @ [facility] calling to inform that a prior authorization is needed through [facility], please call [phone]

## 2017-02-21 ENCOUNTER — Ambulatory Visit: Payer: Self-pay | Admitting: Neurology

## 2017-02-21 ENCOUNTER — Telehealth: Payer: Self-pay | Admitting: *Deleted

## 2017-02-21 NOTE — Telephone Encounter (Addendum)
Patient no show 02/21/17 10:30 appt--car broke down

## 2017-02-22 ENCOUNTER — Telehealth: Payer: Self-pay | Admitting: Neurology

## 2017-02-22 ENCOUNTER — Encounter: Payer: Self-pay | Admitting: Neurology

## 2017-02-22 NOTE — Telephone Encounter (Signed)
Pt has called asking to receive a call back from Dr Cathren Laine RN.  When asked about what, pt just stated she needs to speak with RN

## 2017-02-25 NOTE — Telephone Encounter (Signed)
Called patient, she stated she was not here on Monday because her car would not start. She asked to reschedule. I placed her on the schedule for March 09, 2017 @ 09:30 with an arrival time of 09:00. She verbalized understanding.

## 2017-03-03 ENCOUNTER — Encounter: Payer: Self-pay | Admitting: Neurology

## 2017-03-03 ENCOUNTER — Ambulatory Visit: Payer: Self-pay | Admitting: Neurology

## 2017-03-03 ENCOUNTER — Ambulatory Visit (INDEPENDENT_AMBULATORY_CARE_PROVIDER_SITE_OTHER): Payer: Medicaid Other | Admitting: Neurology

## 2017-03-03 DIAGNOSIS — G43711 Chronic migraine without aura, intractable, with status migrainosus: Secondary | ICD-10-CM | POA: Diagnosis not present

## 2017-03-03 MED ORDER — AMITRIPTYLINE HCL 25 MG PO TABS: 100.0000 mg | ORAL_TABLET | Freq: Every day | ORAL | 11 refills | Status: DC

## 2017-03-03 NOTE — Patient Instructions (Signed)
Amitriptyline tablets What is this medicine? AMITRIPTYLINE (a mee TRIP ti leen) is used to treat depression. This medicine may be used for other purposes; ask your health care provider or pharmacist if you have questions. COMMON BRAND NAME(S): Elavil, Vanatrip What should I tell my health care provider before I take this medicine? They need to know if you have any of these conditions: -an alcohol problem -asthma, difficulty breathing -bipolar disorder or schizophrenia -difficulty passing urine, prostate trouble -glaucoma -heart disease or previous heart attack -liver disease -over active thyroid -seizures -thoughts or plans of suicide, a previous suicide attempt, or family history of suicide attempt -an unusual or allergic reaction to amitriptyline, other medicines, foods, dyes, or preservatives -pregnant or trying to get pregnant -breast-feeding How should I use this medicine? Take this medicine by mouth with a drink of water. Follow the directions on the prescription label. You can take the tablets with or without food. Take your medicine at regular intervals. Do not take it more often than directed. Do not stop taking this medicine suddenly except upon the advice of your doctor. Stopping this medicine too quickly may cause serious side effects or your condition may worsen. A special MedGuide will be given to you by the pharmacist with each prescription and refill. Be sure to read this information carefully each time. Talk to your pediatrician regarding the use of this medicine in children. Special care may be needed. Overdosage: If you think you have taken too much of this medicine contact a poison control center or emergency room at once. NOTE: This medicine is only for you. Do not share this medicine with others. What if I miss a dose? If you miss a dose, take it as soon as you can. If it is almost time for your next dose, take only that dose. Do not take double or extra doses. What  may interact with this medicine? Do not take this medicine with any of the following medications: -arsenic trioxide -certain medicines used to regulate abnormal heartbeat or to treat other heart conditions -cisapride -droperidol -halofantrine -linezolid -MAOIs like Carbex, Eldepryl, Marplan, Nardil, and Parnate -methylene blue -other medicines for mental depression -phenothiazines like perphenazine, thioridazine and chlorpromazine -pimozide -probucol -procarbazine -sparfloxacin -St. John's Wort -ziprasidone This medicine may also interact with the following medications: -atropine and related drugs like hyoscyamine, scopolamine, tolterodine and others -barbiturate medicines for inducing sleep or treating seizures, like phenobarbital -cimetidine -disulfiram -ethchlorvynol -thyroid hormones such as levothyroxine This list may not describe all possible interactions. Give your health care provider a list of all the medicines, herbs, non-prescription drugs, or dietary supplements you use. Also tell them if you smoke, drink alcohol, or use illegal drugs. Some items may interact with your medicine. What should I watch for while using this medicine? Tell your doctor if your symptoms do not get better or if they get worse. Visit your doctor or health care professional for regular checks on your progress. Because it may take several weeks to see the full effects of this medicine, it is important to continue your treatment as prescribed by your doctor. Patients and their families should watch out for new or worsening thoughts of suicide or depression. Also watch out for sudden changes in feelings such as feeling anxious, agitated, panicky, irritable, hostile, aggressive, impulsive, severely restless, overly excited and hyperactive, or not being able to sleep. If this happens, especially at the beginning of treatment or after a change in dose, call your health care professional. You may get   drowsy or  dizzy. Do not drive, use machinery, or do anything that needs mental alertness until you know how this medicine affects you. Do not stand or sit up quickly, especially if you are an older patient. This reduces the risk of dizzy or fainting spells. Alcohol may interfere with the effect of this medicine. Avoid alcoholic drinks. Do not treat yourself for coughs, colds, or allergies without asking your doctor or health care professional for advice. Some ingredients can increase possible side effects. Your mouth may get dry. Chewing sugarless gum or sucking hard candy, and drinking plenty of water will help. Contact your doctor if the problem does not go away or is severe. This medicine may cause dry eyes and blurred vision. If you wear contact lenses you may feel some discomfort. Lubricating drops may help. See your eye doctor if the problem does not go away or is severe. This medicine can cause constipation. Try to have a bowel movement at least every 2 to 3 days. If you do not have a bowel movement for 3 days, call your doctor or health care professional. This medicine can make you more sensitive to the sun. Keep out of the sun. If you cannot avoid being in the sun, wear protective clothing and use sunscreen. Do not use sun lamps or tanning beds/booths. What side effects may I notice from receiving this medicine? Side effects that you should report to your doctor or health care professional as soon as possible: -allergic reactions like skin rash, itching or hives, swelling of the face, lips, or tongue -anxious -breathing problems -changes in vision -confusion -elevated mood, decreased need for sleep, racing thoughts, impulsive behavior -eye pain -fast, irregular heartbeat -feeling faint or lightheaded, falls -feeling agitated, angry, or irritable -fever with increased sweating -hallucination, loss of contact with reality -seizures -stiff muscles -suicidal thoughts or other mood  changes -tingling, pain, or numbness in the feet or hands -trouble passing urine or change in the amount of urine -trouble sleeping -unusually weak or tired -vomiting -yellowing of the eyes or skin Side effects that usually do not require medical attention (report to your doctor or health care professional if they continue or are bothersome): -change in sex drive or performance -change in appetite or weight -constipation -dizziness -dry mouth -nausea -tired -tremors -upset stomach This list may not describe all possible side effects. Call your doctor for medical advice about side effects. You may report side effects to FDA at 1-800-FDA-1088. Where should I keep my medicine? Keep out of the reach of children. Store at room temperature between 20 and 25 degrees C (68 and 77 degrees F). Throw away any unused medicine after the expiration date. NOTE: This sheet is a summary. It may not cover all possible information. If you have questions about this medicine, talk to your doctor, pharmacist, or health care provider.  2018 Elsevier/Gold Standard (2015-09-19 12:14:15)

## 2017-03-03 NOTE — Progress Notes (Signed)
[facility]    Provider:  Dr Jaynee Eagles Referring Provider: Berkley Harvey, NP Primary Care Physician:  Berkley Harvey, NP  CC: Chronic intractable migraines without status migrainosus no aura  Interval history 03/03/2017: Botox denied despite prior auths and appeals. Patient with chronic intractable migraines, without medication overuse, failed multiple classes of meds (see below), pounding, throbbing daily migraines with light and sound sensitivity, nausea, worse with movement, unilateral, daily for years, can last 12 hours or longer. Very frustrating that we cannot provide botox. Discussed options at length, will try Ajovy and a sample was provided to patient.  Interval history 09/29/2016: Patient has a past medical history of chronic headache and neck pain, morbid obesity, asthma, hypothyroidism, anxiety, status post recent bariatric surgery, chronic low back pain. She describes having migraines for about 20 years. She is working with Dr. Rochele Pages in pain management for chronic back pain.  MRI of the brain and cervical spine did not show any etiology for her symptoms. She's had multiple nerve blocks without results. She was sent to [facility] but did not want to pursue the stimulator. Based on her diffuse pain multiple laboratory studies and rheumatologic markers were ordered. [facility] also suggested fibromyalgia. Given her chronic history of migraine headaches could also try other medications such as Depakote, verapamil, diltiazem or Botox. Her amitriptyline was increased at [facility] up to 50 mg.  Patient has chronic migraines. No medication overuse. She's failed multiple medications as listed below. She continues to have daily headaches with at least 15 migraines monthly. This frequency ongoing for over a year and longer. Patient's headaches are right-sided, throbbing pounding, she has light sensitivity and sound sensitivity, she has vomited in the past. No aura.  Migraines can last up to 24 hours or some days longer. On average it lasts at least 12 hours. Can be severe pain. She is very tired of suffering an she's tried everything. She has recently lost 70 pounds in hopes that this will help with her migraines but has not. Today I recommended Botox therapy. I do feel that patient would be a good candidate.  Patient reports memory changes, she forgets things midsentence, has to write things down. I reassured patient that she does not have a degenerative disease such as Alzheimer's, her memory complaints are likely due to chronic pain, normal aging and polypharmacy.  ANA screen negative, sedimentation rate 38, Sjogren's negative  Medications tried: Trileptal. Topiramate. Neurontin. Amitriptyline. Trileptal. Zanaflex. Effexor. She cannot take beta blockers or fluoxetine due to asthma.  Interval History 01/31/2016: She comes back for occipital neuralgia and migraines. MRi of the brain and cervical spine unremarkable. She takes regular tylenol and this can be causing rebound however patient is still taking regardless of our instructions not to take daily or more than 2-3x in a week.Repeat MRi of the brain and cervical cord to evaluate for abnormal white matter changes NOT consistent with MS. Needs to follow with primary care for management of vascular risk factors and obesity as white matter changes likely chronic microvascular changes. MRI of the cervical spine normal. She does not take Ambien at night. She is taking muscle relaxer. She has insomnia. She has anxiety. She in on multiple medications. She is seen at a pain clinic Dr. Rochele [facility].   - For her occipital neuralgia she did not tolerate: Occipital nerve blocks, Trileptal. Medications not helping: Neurontin. Intolerant of Topamax in the past.  - Start Amitriptyline very low dose which may help with  occipital neuralgia and headache, her insomnia and chronic pain  MRI brain  12/26/2015: IMPRESSION: This MRI of the brain with and without contrast shows the following: 1. A few scattered T2/FLAIR hyperintense changes in the subcortical white matter and some subtle periventricular white matter changes. This is a nonspecific finding and could represent early chronic microvascular ischemic change or be the sequela of migraine. Demyelination is less likely to give this pattern. 2. There were no acute findings and there is a normal enhancement pattern. When compared to the MRI dated 03/31/2015, there were no definite interval changes.  MRI cervical spine: normal  MWU:[id number] F Horneis a 41 y.o.femalehere as a referral from Dr. Massie Kluver occipital neuralgia. Past medical history of migraine, morbid obesity, anxiety, fibromyalgia, hypothyroidism, asthma, disc degenerative disease. She was diagnosed with Occipital Neuralgia at [facility]. Symptoms started 02/28/2015 awoke with a loud rining in the ears. No inciting event or trauma. Since June 11 it has been daily and excruciating. It is burning, aching, throbbing on the right side (points to the occipital area). Her jaw and ear hurts. She has neck pain. She endorses numbness in the left and weakness in the arms. Her mouth went numb as well. MRi of the brain in 03/2015 with abnormal white matter periventricular. She also has migraines since 1997. She had abnormal white matter changes in the brain in 03/2015. Severe 10/10 pain continuous. Worse at night. Vision changes. Headaches. Dizziness. Weakness in legs. Multiple neurologic symptoms for many years started when she was pregnant. No FHx MS or autoimmune disease   Reviewed notes, labs and imaging from outside physicians, which showed:   BMP unremarkable with normal creatinine 0.75, cbc unremarkable  Personally reviewed images and agree with the following: At least 5 small FLAIR hyperintensities in the bilateral cerebral white matter with hazy periventricular hyperintensity  around the frontal horn of the left lateral ventricle and the body/atrium of the right lateral ventricle. The hazy areas have no volume loss/mass effect or enhancement. No juxta cortical, corpus callosum, or infratentorial signal abnormality. No anterior temporal involvement. No historical indication of immunocompromised state/HIV.  IMPRESSION: 1. No explanation for hearing loss. 2. 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  Review of Systems: Patient complains of symptoms per HPI as well as the following symptomsfatigue, blurred vision, eye pain, swelling in legs, asthma,, ringing in ears, spinning sensation, joint pain, cramps, aching muscles, memory loss, confusion, headache, numbness, weakness, dizziness, insomnia, sleepiness, snoring, anxiety, not enough sleep, decreased energy, disinterest in activities: . Pertinent negatives per HPI. All others negative.   Social History   Social History  . Marital status: Legally Separated    Spouse name: N/A  . Number of children: 1  . Years of education: GED   Occupational History  . unemployed    Social History Main Topics  . Smoking status: Former Smoker    Packs/day: 0.00    Years: 0.00    Types: Cigarettes    Quit date: 11/2015  . Smokeless tobacco: Never Used  . Alcohol use No  . Drug use: No  . Sexual activity: Not on file   Other Topics Concern  . Not on file   Social History Narrative   Lives with father   Caffeine use: rare   Right handed    Family History  Problem Relation Age of Onset  . Hypertension Mother   . Hypertension Father   . Diabetes Sister   . Cancer  Paternal Grandmother        breast    Past Medical History:  Diagnosis Date  . Anxiety   . Asthma   . Chronic pain syndrome   . Complication of anesthesia    "STATES WAKES UP AND CANT BREATHE AND NEEDS BREATHING TREATMENT  IN PACU"  . Dysrhythmia     palpitations- "from thyroid"  . Fibromyalgia   . Fracture of right foot   . Gastric ulcer 2018  . GERD (gastroesophageal reflux disease)   . Migraine   . Myofacial muscle pain   . Obesity   . OSA (obstructive sleep apnea)    Has CPAP  . Sleep apnea     NO CPAP--could not tolerates mask  . Thyroid disease   . Viral respiratory infection 06/26/13   ? influenza    Past Surgical History:  Procedure Laterality Date  . GALLBLADDER SURGERY  06/2016  . 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 NASAL SEPTUM SURGERY    . THYROID LOBECTOMY Right 07/19/2013   Procedure: THYROID LOBECTOMY;  Surgeon: Harl Bowie, MD;  Location: [facility];  Service: General;  Laterality: Right;  . TUBAL LIGATION    . WISDOM TOOTH EXTRACTION      Current Outpatient Prescriptions  Medication Sig Dispense Refill  . albuterol (PROVENTIL HFA;VENTOLIN HFA) 108 (90 BASE) MCG/ACT inhaler Inhale 1 puff into the lungs every 6 (six) hours as needed for wheezing or shortness of breath.    Marland Kitchen albuterol (PROVENTIL) (2.5 MG/3ML) 0.083% nebulizer solution Take 2.5 mg by nebulization every 6 (six) hours as needed for wheezing or shortness of breath.    Marland Kitchen amitriptyline (ELAVIL) 25 MG tablet TAKE 2 TABLETS (50 MG TOTAL) BY MOUTH AT BEDTIME. 60 tablet 11  . dicyclomine (BENTYL) 20 MG tablet Take 20 mg by mouth.    Lyndle Herrlich SULFATE PO Take 150 mg by mouth 2 (two) times daily.    . furosemide (LASIX) 20 MG tablet Take 2 tablets (40 mg total) by mouth daily. (Patient taking differently: Take 40 mg by mouth daily as needed for fluid. ) 20 tablet 0  . Gabapentin 300 MG/6ML SOLN Take 900 mg by mouth. 18 mL    . levothyroxine (SYNTHROID, LEVOTHROID) 75 MCG tablet Take 75 mcg by mouth daily.    Marland Kitchen LORazepam (ATIVAN) 1 MG tablet Take 1 mg by mouth 2 (two) times daily as needed for anxiety.     . mometasone-formoterol (DULERA) 100-5 MCG/ACT AERO Inhale 2 puffs into the lungs 2 (two) times daily.    . OxyCODONE ER (XTAMPZA ER) 9 MG C12A Take 1  tablet by mouth every 12 (twelve) hours.    Marland Kitchen oxyCODONE-acetaminophen (PERCOCET/ROXICET) 5-325 MG per tablet Take 1-2 tablets by mouth every 6 (six) hours as needed for severe pain. (Patient taking differently: Take 1 tablet by mouth daily as needed. ) 6 tablet 0  . pantoprazole (PROTONIX) 40 MG tablet Take 40 mg by mouth 2 (two) times daily.     . promethazine (PHENERGAN) 25 MG tablet Take 25 mg by mouth every 4 (four) hours as needed for nausea or vomiting.    Marland Kitchen tiZANidine (ZANAFLEX) 4 MG tablet Take 1-2 tablets (4-8 mg total) by mouth every 8 (eight) hours as needed for muscle spasms. 90 tablet 6  . venlafaxine (EFFEXOR) 75 MG tablet Take 75 mg by mouth daily.     Marland Kitchen zolpidem (AMBIEN CR) 12.5 MG CR tablet Take 12.5 mg by mouth at bedtime as needed for sleep.  No current facility-administered medications for this visit.     Allergies as of 03/03/2017 - Review Complete 03/03/2017  Allergen Reaction Noted  . Beta adrenergic blockers Anaphylaxis 12/02/2012  . Nsaids  06/07/2016  . Tolmetin Other (See Comments) 06/07/2016    Vitals: BP 100/64   Pulse 83   Wt 236 lb 9.6 oz (107.3 kg)   BMI 39.99 kg/m  Last Weight:  Wt Readings from Last 1 Encounters:  03/03/17 236 lb 9.6 oz (107.3 kg)   Last Height:   Ht Readings from Last 1 Encounters:  09/29/16 5' 4.5" (1.638 m)       Physical exam: Exam: Gen: NAD, conversant, well nourised, morbidly obese, well groomed  Eyes: Conjunctivae clear without exudates or hemorrhage  Neuro: Detailed Neurologic Exam  Speech: Speech is normal; fluent and spontaneous with normal comprehension.  Cognition: The patient is oriented to person, place, and time;  recent and remote memory intact;  language fluent;  normal attention, concentration,  fund of knowledge Cranial Nerves: The pupils are equal, round, and reactive to light. The fundi are normal and spontaneous venous pulsations are present. Visual  fields are full to finger confrontation. Extraocular movements are intact. Trigeminal sensation is intact and the muscles of mastication are normal. The face is symmetric. The palate elevates in the midline. Hearing intact. Voice is normal. Shoulder shrug is normal. The tongue has normal motion without fasciculations.    Gait: Wide based due to extremely large body habitus  Motor Observation:  No asymmetry, no atrophy, and no involuntary movements noted. Tone: Normal muscle tone.   Posture: Posture is normal. normal erect  Strength:mild prox left arm and leg weakness.otherwsie strength is V/V in the upper and lower limbs.    Reflex Exam:  DTR's: absent AJs otherwise deep tendon reflexes in the upper and lower extremities are normal bilaterally.  Toes: The toes are downgoing bilaterally.  Clonus: Clonus is absent.    Assessment/Plan:41 y.o. femalehere as a referral from Dr. Massie Kluver chronic intractable migraines and occipital neuralgia. She has the long history of refractory migraines. Also.Diagnosed at [facility] with occipital neuralgia as well.  Past medical history of migraine, morbid obesity, anxiety, fibromyalgia, hypothyroidism, asthma, disc degenerative disease. Multiple neurologic complaints.  - Insurance has denied botox despite intractable migraines with failure of multiple medications. Very frustrating for patient and our office. Will continue next with Ajovy.  - Medications tried: Trileptal. Topiramate. Neurontin. Amitriptyline. Trileptal. Zanaflex. Effexor. She cannot take beta blockers or fluoxetine due to asthma.  - Repeat MRi of the brain and cervical cord to evaluate for abnormal white matter changes NOT consistent with MS. Needs to follow with primary care for management of vascular risk factors and obesity as white matter changes likely chronic microvascular changes. MRI of the cervical spine normal.   - memory changes: highly  unlikely neurodegenerative neurologic condition likely due to psychiatric issues, chronic pain, polypharmacy. Needs management of these other conditions.  - weight loss: recent bariatric surgery has lost 70 pounds, obesity is correlated with worsening migraines. Discussed this.  Discussed: To prevent or relieve headaches, try the following: Cool Compress. Lie down and place a cool compress on your head.  Avoid headache triggers. If certain foods or odors seem to have triggered your migraines in the past, avoid them. A headache diary might help you identify triggers.  Include physical activity in your daily routine. Try a daily walk or other moderate aerobic exercise.  Manage stress. Find healthy ways to cope with the stressors,  such as delegating tasks on your to-do list.  Practice relaxation techniques. Try deep breathing, yoga, massage and visualization.  Eat regularly. Eating regularly scheduled meals and maintaining a healthy diet might help prevent headaches. Also, drink plenty of fluids.  Follow a regular sleep schedule. Sleep deprivation might contribute to headaches Consider biofeedback. With this mind-body technique, you learn to control certain bodily functions - such as muscle tension, heart rate and blood pressure - to prevent headaches or reduce headache pain.    Proceed to emergency room if you experience new or worsening symptoms or symptoms do not resolve, if you have new neurologic symptoms or if headache is severe, or for any concerning symptom.   Provided education and documentation from American headache Society toolbox including articles on: chronic migraine medication overuse headache, chronic migraines, prevention of migraines, behavioral and other nonpharmacologic treatments for headache.   Sarina Ill, MD  [facility] [address] [place], [place] [address]  Phone [phone] Fax [phone]  A total of 25 minutes was spent  face-to-face with this patient. Over half this time was spent on counseling patient on the chronic intractable migraine diagnosis and different diagnostic and therapeutic options available.

## 2017-03-06 DIAGNOSIS — G43711 Chronic migraine without aura, intractable, with status migrainosus: Secondary | ICD-10-CM | POA: Insufficient documentation

## 2017-03-06 MED ORDER — NONFORMULARY OR COMPOUNDED ITEM: 1.0000 "application " | 11 refills | Status: DC

## 2017-03-09 ENCOUNTER — Ambulatory Visit: Payer: Self-pay | Admitting: Neurology

## 2017-05-04 ENCOUNTER — Other Ambulatory Visit: Payer: Self-pay | Admitting: Neurology

## 2017-05-05 ENCOUNTER — Ambulatory Visit: Payer: Medicaid Other | Admitting: Neurology

## 2017-05-05 ENCOUNTER — Encounter: Payer: Self-pay | Admitting: Neurology

## 2017-05-05 VITALS — BP 133/90 | HR 63 | Ht 64.5 in | Wt 231.8 lb

## 2017-05-05 DIAGNOSIS — G43711 Chronic migraine without aura, intractable, with status migrainosus: Secondary | ICD-10-CM | POA: Diagnosis not present

## 2017-05-05 MED ORDER — GALCANEZUMAB-GNLM 120 MG/ML ~~LOC~~ SOAJ: 120.0000 mg | SUBCUTANEOUS | 11 refills | Status: DC

## 2017-05-05 MED ORDER — TOPIRAMATE 100 MG PO TABS: 100.0000 mg | ORAL_TABLET | Freq: Every day | ORAL | 6 refills | Status: DC

## 2017-05-05 NOTE — Progress Notes (Signed)
[facility]    Provider:  Dr Jaynee Eagles Referring Provider: Berkley Harvey, NP Primary Care Physician:  Berkley Harvey, NP   CC: Chronic intractable migraines without status migrainosus no aura  Interval history 05/05/2017: Botox denied despite prior auths and appeals. Chronic intractable migraines, multiple other neurologic symptoms, in pain management, has been to multiple neurologists and failed multiple classes of medications. She is now having side effects to Ajovy, she continues to have severe migraines.  She tried Topamax in the past but was only on 25mg  she thinks and stopped taking it.  She has chronic back pain and is on opiate medication chronically.   -Medications tried and failed or side effects: Trileptal. Topiramate. Neurontin. Amitriptyline. Trileptal. Zanaflex. Effexor, Robaxin. She cannot take beta blockers or fluoxetine due to asthma. Had side effects to Ajovy (insomnia, muscle aches)  Interval history 03/03/2017: Botox denied despite prior auths and appeals. Patient with chronic intractable migraines, without medication overuse, failed multiple classes of meds (see below), pounding, throbbing daily migraines with light and sound sensitivity, nausea, worse with movement, unilateral, daily for years, can last 12 hours or longer. Very frustrating that we cannot provide botox. Discussed options at length, will try Ajovy and a sample was provided to patient.  Interval history 09/29/2016:Patient has a past medical history of chronic headache and neck pain, morbid obesity, asthma, hypothyroidism, anxiety, status post recent bariatric surgery, chronic low back pain. She describes having migraines for about 20 years. She is working with Dr. Rochele Pages in pain management for chronic back pain. MRI of the brain and cervical spine did not show any etiology for her symptoms. She's had multiple nerve blocks without results. She was sent to [facility] but did not want to  pursue the stimulator. Based on her diffuse pain multiple laboratory studies and rheumatologic markers were ordered. [facility] also suggested fibromyalgia. Given her chronic history of migraine headaches could also try other medications such as Depakote, verapamil, diltiazem or Botox. Her amitriptyline was increased at [facility] up to 50 mg.  Patient has chronic migraines. No medication overuse. She's failed multiple medications as listed below. She continues to have daily headaches with at least 15 migraines monthly. This frequency ongoing for over a year and longer. Patient's headaches are right-sided, throbbing pounding, she has light sensitivity and sound sensitivity, she has vomited in the past. No aura. Migraines can last up to 24 hours or some days longer. On average it lasts at least 12 hours. Can be severe pain. She is very tired of suffering an she's tried everything. She has recently lost 70 pounds in hopes that this will help with her migraines but has not. Today I recommended Botox therapy. I do feel that patient would be a good candidate.  Patient reports memory changes, she forgets things midsentence, has to write things down. I reassured patient that she does not have a degenerative disease such as Alzheimer's, her memory complaints are likely due to chronic pain, normal aging and polypharmacy.  ANA screen negative, sedimentation rate 38, Sjogren's negative  Medications tried: Trileptal. Topiramate. Neurontin. Amitriptyline. Trileptal. Zanaflex. Effexor. She cannot take beta blockers or fluoxetine due to asthma.  Interval History 01/31/2016: She comes back for occipital neuralgiaand migraines. MRi of the brain and cervical spine unremarkable. She takes regular tylenol and this can be causing rebound however patient is still taking regardless of our instructions not to take daily or more than 2-3x in a week.Repeat MRi of the brain and cervical cord  to evaluate for abnormal white  matter changes NOT consistent with MS. Needs to follow with primary care for management of vascular risk factors and obesity as white matter changes likely chronic microvascular changes. MRI of the cervical spine normal. She does not take Ambien at night. She is taking muscle relaxer. She has insomnia. She has anxiety. She in on multiple medications. She is seen at a pain clinic Dr. [facility]le [facility] at [facility].   - For her occipital neuralgia she did not tolerate: Occipital nerve blocks, Trileptal. Medications not helping: Neurontin. Intolerant of Topamax in the past.  - Start Amitriptyline very low dose which may help with occipital neuralgia and headache, her insomnia and chronic pain  MRI brain 12/26/2015: IMPRESSION: This MRI of the brain with and without contrast shows the following: 1. A few scattered T2/FLAIR hyperintense changes in the subcortical white matter and some subtle periventricular white matter changes. This is a nonspecific finding and could represent early chronic microvascular ischemic change or be the sequela of migraine. Demyelination is less likely to give this pattern. 2. There were no acute findings and there is a normal enhancement pattern. When compared to the MRI dated 03/31/2015, there were no definite interval changes.  MRI cervical spine: normal  RWE:[id number] F Horneis a 42 y.o.femalehere as a referral from Dr. Massie Kluver occipital neuralgia. Past medical history of migraine, morbid obesity, anxiety, fibromyalgia, hypothyroidism, asthma, disc degenerative disease. She was diagnosed with Occipital Neuralgia at [facility]. Symptoms started 02/28/2015 awoke with a loud rining in the ears. No inciting event or trauma. Since June 11 it has been daily and excruciating. It is burning, aching, throbbing on the right side (points to the occipital area). Her jaw and ear hurts. She has neck pain. She endorses numbness in the left and weakness in the arms. Her mouth went numb as well. MRi  of the brain in 03/2015 with abnormal white matter periventricular. She also has migraines since 1997. She had abnormal white matter changes in the brain in 03/2015. Severe 10/10 pain continuous. Worse at night. Vision changes. Headaches. Dizziness. Weakness in legs. Multiple neurologic symptoms for many years started when she was pregnant. No FHx MS or autoimmune disease   Reviewed notes, labs and imaging from outside physicians, which showed:   BMP unremarkable with normal creatinine 0.75, cbc unremarkable  Personally reviewed images and agree with the following: At least 5 small FLAIR hyperintensities in the bilateral cerebral white matter with hazy periventricular hyperintensity around the frontal horn of the left lateral ventricle and the body/atrium of the right lateral ventricle. The hazy areas have no volume loss/mass effect or enhancement. No juxta cortical, corpus callosum, or infratentorial signal abnormality. No anterior temporal involvement. No historical indication of immunocompromised state/HIV.  IMPRESSION: 1. No explanation for hearing loss. 2. 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  Review of Systems: Patient complains of symptoms per HPI as well as the following symptoms: Light sensitivity, eye pain, wheezing, shortness of breath, leg swelling, abdominal pain, nausea, insomnia, anemia, memory loss, dizziness, headache, numbness, weakness, depression joint pain, joint swelling, back pain, aching muscles, muscle cramps, walking difficulty, neck pain, neck stiffness. Pertinent negatives per HPI. All others negative.   Social History   Socioeconomic History  . Marital status: Legally Separated    Spouse name: Not on file  . Number of children: 1  . Years of education: GED  . Highest education level: Not on file  Social Needs  .  Financial resource strain: Not on file  .  Food insecurity - worry: Not on file  . Food insecurity - inability: Not on file  . Transportation needs - medical: Not on file  . Transportation needs - non-medical: Not on file  Occupational History  . Occupation: unemployed  Tobacco Use  . Smoking status: Former Smoker    Packs/day: 0.00    Years: 0.00    Pack years: 0.00    Types: Cigarettes    Last attempt to quit: 11/2015    Years since quitting: 1.5  . Smokeless tobacco: Never Used  Substance and Sexual Activity  . Alcohol use: No  . Drug use: No  . Sexual activity: Not on file  Other Topics Concern  . Not on file  Social History Narrative   Lives with father   Caffeine use: rare   Right handed    Family History  Problem Relation Age of Onset  . Hypertension Mother   . Hypertension Father   . Prostate cancer Father   . Diabetes Sister   . Cancer Paternal Grandmother        breast    Past Medical History:  Diagnosis Date  . Anxiety   . Asthma   . Chronic pain syndrome   . Complication of anesthesia    "STATES WAKES UP AND CANT BREATHE AND NEEDS BREATHING TREATMENT  IN PACU"  . Dysrhythmia    palpitations- "from thyroid"  . Fibromyalgia   . Fracture of right foot   . Gastric ulcer 2018  . GERD (gastroesophageal reflux disease)   . Migraine   . Myofacial muscle pain   . Obesity   . OSA (obstructive sleep apnea)    Has CPAP  . Sleep apnea     NO CPAP--could not tolerates mask  . Thyroid disease   . Viral respiratory infection 06/26/13   ? influenza    Past Surgical History:  Procedure Laterality Date  . GALLBLADDER SURGERY  06/2016  . 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 NASAL SEPTUM SURGERY    . THYROID LOBECTOMY Right 07/19/2013   Procedure: THYROID LOBECTOMY;  Surgeon: Harl Bowie, MD;  Location: [facility];  Service: General;  Laterality: Right;  . TUBAL LIGATION    . WISDOM TOOTH EXTRACTION      Current Outpatient Medications  Medication Sig Dispense Refill  . albuterol (PROVENTIL  HFA;VENTOLIN HFA) 108 (90 BASE) MCG/ACT inhaler Inhale 1 puff into the lungs every 6 (six) hours as needed for wheezing or shortness of breath.    Marland Kitchen albuterol (PROVENTIL) (2.5 MG/3ML) 0.083% nebulizer solution Take 2.5 mg by nebulization every 6 (six) hours as needed for wheezing or shortness of breath.    Marland Kitchen amitriptyline (ELAVIL) 25 MG tablet Take 4 tablets (100 mg total) by mouth at bedtime. 60 tablet 11  . dicyclomine (BENTYL) 20 MG tablet Take 20 mg by mouth.    Lyndle Herrlich SULFATE PO Take 150 mg by mouth 2 (two) times daily.    . furosemide (LASIX) 20 MG tablet Take 2 tablets (40 mg total) by mouth daily. (Patient taking differently: Take 40 mg by mouth daily as needed for fluid. ) 20 tablet 0  . Gabapentin 300 MG/6ML SOLN Take 900 mg by mouth. 18 mL    . levothyroxine (SYNTHROID, LEVOTHROID) 75 MCG tablet Take 75 mcg by mouth daily.    Marland Kitchen LORazepam (ATIVAN) 1 MG tablet Take 1 mg by mouth 2 (two) times daily as needed for anxiety.     Marland Kitchen  mometasone-formoterol (DULERA) 100-5 MCG/ACT AERO Inhale 2 puffs into the lungs 2 (two) times daily.    . NONFORMULARY OR COMPOUNDED ITEM Inject 1 application every 30 (thirty) days into the skin. 1 each 11  . OxyCODONE ER (XTAMPZA ER) 9 MG C12A Take 1 tablet by mouth every 12 (twelve) hours.    Marland Kitchen oxyCODONE-acetaminophen (PERCOCET/ROXICET) 5-325 MG per tablet Take 1-2 tablets by mouth every 6 (six) hours as needed for severe pain. (Patient taking differently: Take 1 tablet by mouth daily as needed. ) 6 tablet 0  . pantoprazole (PROTONIX) 40 MG tablet Take 40 mg by mouth 2 (two) times daily.     . promethazine (PHENERGAN) 25 MG tablet Take 25 mg by mouth every 4 (four) hours as needed for nausea or vomiting.    Marland Kitchen tiZANidine (ZANAFLEX) 4 MG tablet TAKE 1-2 TABLETS (4-8 MG TOTAL) BY MOUTH EVERY 8 (EIGHT) HOURS AS NEEDED FOR MUSCLE SPASMS. 90 tablet 0  . zolpidem (AMBIEN CR) 12.5 MG CR tablet Take 12.5 mg by mouth at bedtime as needed for sleep.     No current  facility-administered medications for this visit.     Allergies as of 05/05/2017 - Review Complete 05/05/2017  Allergen Reaction Noted  . Beta adrenergic blockers Anaphylaxis 12/02/2012  . Nsaids  06/07/2016  . Tolmetin Other (See Comments) 06/07/2016    Vitals: BP 133/90 (BP Location: Left Arm, Patient Position: Sitting)   Pulse 63   Ht 5' 4.5" (1.638 m)   Wt 231 lb 12.8 oz (105.1 kg)   BMI 39.17 kg/m  Last Weight:  Wt Readings from Last 1 Encounters:  05/05/17 231 lb 12.8 oz (105.1 kg)   Last Height:   Ht Readings from Last 1 Encounters:  05/05/17 5' 4.5" (1.638 m)   Physical exam: Exam: Gen: NAD, conversant, well nourised, obese, well groomed                     CV: RRR, no MRG. No Carotid Bruits. No peripheral edema, warm, nontender Eyes: Conjunctivae clear without exudates or hemorrhage  Neuro: Detailed Neurologic Exam  Speech:    Speech is normal; fluent and spontaneous with normal comprehension.  Cognition:    The patient is oriented to person, place, and time;     recent and remote memory intact;     language fluent;     normal attention, concentration,     fund of knowledge Cranial Nerves:    The pupils are equal, round, and reactive to light. The fundi are normal and spontaneous venous pulsations are present. Visual fields are full to finger confrontation. Extraocular movements are intact. Trigeminal sensation is intact and the muscles of mastication are normal. The face is symmetric. The palate elevates in the midline. Hearing intact. Voice is normal. Shoulder shrug is normal. The tongue has normal motion without fasciculations.   Coordination:    Normal finger to nose and heel to shin. Normal rapid alternating movements.   Gait:    Heel-toe and tandem gait are normal.   Motor Observation:    No asymmetry, no atrophy, and no involuntary movements noted. Tone:    Normal muscle tone.    Posture:    Posture is normal. normal erect    Strength:     Strength is V/V in the upper and lower limbs.      Sensation: intact to LT     Reflex Exam:  DTR's:    Deep tendon reflexes in the upper and lower  extremities are normal bilaterally.   Toes:    The toes are downgoing bilaterally.   Clonus:    Clonus is absent.   Assessment/Plan:41 y.o. femalehere as a referral from Dr. Cindi Carbon intractable migraines and occipital neuralgia. She has the long history of refractory migraines. Also.Diagnosed at [facility] with occipital neuralgia as well. Past medical history of migraine, morbid obesity, anxiety, fibromyalgia, hypothyroidism, asthma, disc degenerative disease. Multiple neurologic complaints.  - Keeps failing medications, now having side effects to Ajovy. Will switch to [employer]. Also will try another preventative, will retry Topiramate.  - Insurance has denied botox despite intractable migraines with failure of multiple medications. Very frustrating for patient and our office.  -Medications tried and failed or side effects: Trileptal. Topiramate. Neurontin. Amitriptyline. Trileptal. Zanaflex. Effexor, Robaxin. She cannot take beta blockers or fluoxetine due to asthma. Had side effects to Ajovy (insomnia, muscle aches)  - Repeat MRi of the brain and cervical cord to evaluate for abnormal white matter changes NOT consistent with MS. Needs to follow with primary care for management of vascular risk factors and obesity as white matter changes likely chronic microvascular changes. MRI of the cervical spine normal.   - memory changes: highly unlikely neurodegenerative neurologic condition likely due to psychiatric issues, chronic pain, polypharmacy. Needs management of these other conditions.  - weight loss: recent bariatric surgery has lost 70 pounds,obesity is correlated with worsening migraines. Discussed this. She is doing well   Sarina Ill, MD  [facility] [address] [place], [place]  [address]  Phone [phone] Fax [phone]  A total of 15 minutes was spent in with this patient face-to-face. Over half this time was spent on counseling patient on the chronic migraine diagnosis and different therapeutic options available.

## 2017-05-05 NOTE — Patient Instructions (Addendum)
Topiramate: take 1/2 pill for 2 weeks then increase to a whole pill Emgality: For first injection use 240mg  ( 2 injections) and then after that use one injection a month  Topiramate tablets What is this medicine? TOPIRAMATE (toe PYRE a mate) is used to treat seizures in adults or children with epilepsy. It is also used for the prevention of migraine headaches. This medicine may be used for other purposes; ask your health care provider or pharmacist if you have questions. COMMON BRAND NAME(S): Topamax, Topiragen What should I tell my health care provider before I take this medicine? They need to know if you have any of these conditions: -bleeding disorders -cirrhosis of the liver or liver disease -diarrhea -glaucoma -kidney stones or kidney disease -low blood counts, like low white cell, platelet, or red cell counts -lung disease like asthma, obstructive pulmonary disease, emphysema -metabolic acidosis -on a ketogenic diet -schedule for surgery or a procedure -suicidal thoughts, plans, or attempt; a previous suicide attempt by you or a family member -an unusual or allergic reaction to topiramate, other medicines, foods, dyes, or preservatives -pregnant or trying to get pregnant -breast-feeding How should I use this medicine? Take this medicine by mouth with a glass of water. Follow the directions on the prescription label. Do not crush or chew. You may take this medicine with meals. Take your medicine at regular intervals. Do not take it more often than directed. Talk to your pediatrician regarding the use of this medicine in children. Special care may be needed. While this drug may be prescribed for children as young as 54 years of age for selected conditions, precautions do apply. Overdosage: If you think you have taken too much of this medicine contact a poison control center or emergency room at once. NOTE: This medicine is only for you. Do not share this medicine with others. What if  I miss a dose? If you miss a dose, take it as soon as you can. If your next dose is to be taken in less than 6 hours, then do not take the missed dose. Take the next dose at your regular time. Do not take double or extra doses. What may interact with this medicine? Do not take this medicine with any of the following medications: -probenecid This medicine may also interact with the following medications: -acetazolamide -alcohol -amitriptyline -aspirin and aspirin-like medicines -birth control pills -certain medicines for depression -certain medicines for seizures -certain medicines that treat or prevent blood clots like warfarin, enoxaparin, dalteparin, apixaban, dabigatran, and rivaroxaban -digoxin -hydrochlorothiazide -lithium -medicines for pain, sleep, or muscle relaxation -metformin -methazolamide -NSAIDS, medicines for pain and inflammation, like ibuprofen or naproxen -pioglitazone -risperidone This list may not describe all possible interactions. Give your health care provider a list of all the medicines, herbs, non-prescription drugs, or dietary supplements you use. Also tell them if you smoke, drink alcohol, or use illegal drugs. Some items may interact with your medicine. What should I watch for while using this medicine? Visit your doctor or health care professional for regular checks on your progress. Do not stop taking this medicine suddenly. This increases the risk of seizures if you are using this medicine to control epilepsy. Wear a medical identification bracelet or chain to say you have epilepsy or seizures, and carry a card that lists all your medicines. This medicine can decrease sweating and increase your body temperature. Watch for signs of deceased sweating or fever, especially in children. Avoid extreme heat, hot baths, and saunas. Be careful  about exercising, especially in hot weather. Contact your health care provider right away if you notice a fever or decrease in  sweating. You should drink plenty of fluids while taking this medicine. If you have had kidney stones in the past, this will help to reduce your chances of forming kidney stones. If you have stomach pain, with nausea or vomiting and yellowing of your eyes or skin, call your doctor immediately. You may get drowsy, dizzy, or have blurred vision. Do not drive, use machinery, or do anything that needs mental alertness until you know how this medicine affects you. To reduce dizziness, do not sit or stand up quickly, especially if you are an older patient. Alcohol can increase drowsiness and dizziness. Avoid alcoholic drinks. If you notice blurred vision, eye pain, or other eye problems, seek medical attention at once for an eye exam. The use of this medicine may increase the chance of suicidal thoughts or actions. Pay special attention to how you are responding while on this medicine. Any worsening of mood, or thoughts of suicide or dying should be reported to your health care professional right away. This medicine may increase the chance of developing metabolic acidosis. If left untreated, this can cause kidney stones, bone disease, or slowed growth in children. Symptoms include breathing fast, fatigue, loss of appetite, irregular heartbeat, or loss of consciousness. Call your doctor immediately if you experience any of these side effects. Also, tell your doctor about any surgery you plan on having while taking this medicine since this may increase your risk for metabolic acidosis. Birth control pills may not work properly while you are taking this medicine. Talk to your doctor about using an extra method of birth control. Women who become pregnant while using this medicine may enroll in the [place] Pregnancy Registry by calling [phone]. This registry collects information about the safety of antiepileptic drug use during pregnancy. What side effects may I notice from receiving  this medicine? Side effects that you should report to your doctor or health care professional as soon as possible: -allergic reactions like skin rash, itching or hives, swelling of the face, lips, or tongue -decreased sweating and/or rise in body temperature -depression -difficulty breathing, fast or irregular breathing patterns -difficulty speaking -difficulty walking or controlling muscle movements -hearing impairment -redness, blistering, peeling or loosening of the skin, including inside the mouth -tingling, pain or numbness in the hands or feet -unusual bleeding or bruising -unusually weak or tired -worsening of mood, thoughts or actions of suicide or dying Side effects that usually do not require medical attention (report to your doctor or health care professional if they continue or are bothersome): -altered taste -back pain, joint or muscle aches and pains -diarrhea, or constipation -headache -loss of appetite -nausea -stomach upset, indigestion -tremors This list may not describe all possible side effects. Call your doctor for medical advice about side effects. You may report side effects to FDA at 1-800-FDA-1088. Where should I keep my medicine? Keep out of the reach of children. Store at room temperature between 15 and 30 degrees C (59 and 86 degrees F) in a tightly closed container. Protect from moisture. Throw away any unused medicine after the expiration date. NOTE: This sheet is a summary. It may not cover all possible information. If you have questions about this medicine, talk to your doctor, pharmacist, or health care provider.  2018 Elsevier/Gold Standard (2013-04-23 23:17:57)

## 2017-05-27 ENCOUNTER — Telehealth: Payer: Self-pay | Admitting: Neurology

## 2017-05-27 NOTE — Telephone Encounter (Signed)
Pt called she said MD has approved for botox. Please call to advise.

## 2017-05-30 NOTE — Telephone Encounter (Signed)
Pt is wanting to know if she will still need to take Galcanezumab-gnlm [facility]) 120 MG/ML SOAJ since she has Botox scheduled for 06/21/17 please call to advise

## 2017-05-30 NOTE — Telephone Encounter (Signed)
I called and scheduled the patient for her injection.  °

## 2017-05-31 NOTE — Telephone Encounter (Signed)
If she is not having side effects then I would continue to take it and we can discuss at botox appointment. But I am not aware that her botox was approved. Danielle?

## 2017-05-31 NOTE — Telephone Encounter (Signed)
Called patient and LVM (ok per DPR) informing patient that Dr. Jaynee Eagles says if patient is not having any side effects she can continue the [facility] and then discuss at Botox appt. Also noted in message that pt's botox was approved and we will see her for her injection on 06/21/17 @ 11:30 arrival time 11:00. Left office number and encouraged patient to call with any questions.

## 2017-05-31 NOTE — Telephone Encounter (Signed)
Patient has been approved, apt has been scheduled, I will order the medication from the pharmacy.

## 2017-05-31 NOTE — Telephone Encounter (Signed)
Excellent thank you

## 2017-06-16 ENCOUNTER — Other Ambulatory Visit: Payer: Self-pay | Admitting: Neurology

## 2017-06-20 NOTE — Telephone Encounter (Signed)
Pt called and wanted Danielle to be made aware that her Botox will be here at 8:00 am on 06-21-2017, no call back requested

## 2017-06-20 NOTE — Telephone Encounter (Signed)
Noted, thank you

## 2017-06-20 NOTE — Telephone Encounter (Signed)
error 

## 2017-06-21 ENCOUNTER — Ambulatory Visit: Payer: Medicaid Other | Admitting: Neurology

## 2017-06-21 ENCOUNTER — Telehealth: Payer: Self-pay | Admitting: Neurology

## 2017-06-21 ENCOUNTER — Encounter: Payer: Self-pay | Admitting: Neurology

## 2017-06-21 VITALS — BP 109/66 | HR 82

## 2017-06-21 DIAGNOSIS — G43711 Chronic migraine without aura, intractable, with status migrainosus: Secondary | ICD-10-CM | POA: Diagnosis not present

## 2017-06-21 NOTE — Progress Notes (Signed)
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

## 2017-06-21 NOTE — Progress Notes (Signed)

## 2017-06-21 NOTE — Telephone Encounter (Signed)
12 wk. Botox appt.

## 2017-06-23 NOTE — Telephone Encounter (Signed)
I called and scheduled the patient.  °

## 2017-07-17 HISTORY — PX: ENDOMETRIAL ABLATION: SHX621

## 2017-07-28 ENCOUNTER — Telehealth: Payer: Self-pay | Admitting: Neurology

## 2017-07-28 MED ORDER — TOPIRAMATE 200 MG PO TABS: 200.0000 mg | ORAL_TABLET | Freq: Every day | ORAL | 6 refills | Status: DC

## 2017-07-28 NOTE — Telephone Encounter (Signed)
Called patient and discussed her migraine and her Topamax. Pt stated that she has been on Topamax 150 mg since her last visit 2/19. She has requested to increase her Topamax to 200 mg and have new prescription sent. Also asked pt if she had tried a steroid dose pack (suggested by MD) which she gladly accepted. Patient stated that she just had surgery yesterday and felt the migraine coming on yesterday. She stated that it is time again for her to take a Percocet and she will also take a Benadryl and put an ice pack on her head. She refused urgent care and said she didn't want to wait. Pt made aware that a steroid dose pack may take a day or so to start helping the headache. Pt stated that she was having double vision (which she usually has with migraine), unable to think straight, and had a bad headache. RN suggested that patient go to urgent care or go to ED if symptoms progress to worst headache of life, weakness, difficulty speaking, balance issue, vision changes. Patient verbalized understanding and stated that she would go to urgent care.  At this time since pt is going to urgent care, will hold on steroid. RN encouraged pt to call office tomorrow with an update and to go to nearest urgent care. Pt verbalized understanding and appreciation.    Per Dr. Jaynee Eagles, ok to increase Topamax to 200 mg PO HS. E-scribed new prescription.

## 2017-07-28 NOTE — Telephone Encounter (Signed)
Pt called teary eyed, she had an ablasion yesterday and is experiencing a migraine today. She tried to get an appt with PCP for toradol injection but there was not an availability.  Rn was skyped, she was with a pt but advised if it was a bad migraine the pt should probably go to an urgent care. Rn also advised being so late in the day there would not be enough time to get an infusion. I told the pt the RN's leave at 4:00 in the infusion suite. Pt was very teary-eyed said she did not want to sit at urgent care and she would try to deal with. FYI

## 2017-07-28 NOTE — Telephone Encounter (Signed)
Addressed in other phone note.

## 2017-07-28 NOTE — Telephone Encounter (Signed)
Pt requesting a call to discuss upping her dose to 200 MG for topiramate (TOPAMAX) 100 MG tablet. Please call to advise

## 2017-07-29 ENCOUNTER — Encounter (HOSPITAL_COMMUNITY): Payer: Self-pay | Admitting: Emergency Medicine

## 2017-07-29 ENCOUNTER — Other Ambulatory Visit: Payer: Self-pay

## 2017-07-29 ENCOUNTER — Ambulatory Visit (HOSPITAL_COMMUNITY)
Admission: EM | Admit: 2017-07-29 | Discharge: 2017-07-29 | Disposition: A | Discharge status: Home / Self Care | Payer: Medicaid Other | Attending: Family Medicine | Admitting: Family Medicine

## 2017-07-29 DIAGNOSIS — G43019 Migraine without aura, intractable, without status migrainosus: Secondary | ICD-10-CM

## 2017-07-29 MED ORDER — DEXAMETHASONE SODIUM PHOSPHATE 10 MG/ML IJ SOLN
INTRAMUSCULAR | Status: AC
  Filled 2017-07-29: qty 1

## 2017-07-29 MED ORDER — DEXAMETHASONE SODIUM PHOSPHATE 10 MG/ML IJ SOLN
10.0000 mg | Freq: Once | INTRAMUSCULAR | Status: AC
  Administered 2017-07-29: 10 mg via INTRAMUSCULAR

## 2017-07-29 MED ORDER — KETOROLAC TROMETHAMINE 60 MG/2ML IM SOLN
60.0000 mg | Freq: Once | INTRAMUSCULAR | Status: AC
  Administered 2017-07-29: 60 mg via INTRAMUSCULAR

## 2017-07-29 MED ORDER — DIPHENHYDRAMINE HCL 50 MG/ML IJ SOLN
INTRAMUSCULAR | Status: AC
  Filled 2017-07-29: qty 1

## 2017-07-29 MED ORDER — DIPHENHYDRAMINE HCL 50 MG/ML IJ SOLN
50.0000 mg | Freq: Once | INTRAMUSCULAR | Status: AC
  Administered 2017-07-29: 50 mg via INTRAMUSCULAR

## 2017-07-29 MED ORDER — KETOROLAC TROMETHAMINE 60 MG/2ML IM SOLN
INTRAMUSCULAR | Status: AC
  Filled 2017-07-29: qty 2

## 2017-07-29 NOTE — ED Provider Notes (Signed)
[place]    CSN: [id number] Arrival date & time: 07/29/17  1717     History   Chief Complaint Chief Complaint  Patient presents with  . Migraine    HPI Cindy Hoffman is a 42 y.o. female.   42 year old female comes in for 2 day history of migraines. She had endometrial ablation 3 days ago, states that she felt and onset of headache prior to procedure, and worse after procedure. She takes topamax and gets botox and Emgality infusions. She called her neurologist yesterday, but there were no spots opened to get the treatments. She was advised to come here for toradol injection, but was unable to make it yesterday due to being sore from surgery. Pain is frontal, pressure sensation, with pressure to the eyes. Also has pain from right posterior head. Has had some nausea without vomiting. Denies URI symptoms such as congestion, cough, rhinorrhea. Has had photophobia, phonophobia. Denies worse headache in her life/thunderclap headache. Denies syncope, weakness, dizziness, confusion.      Past Medical History:  Diagnosis Date  . Anxiety   . Asthma   . Chronic pain syndrome   . Complication of anesthesia    "STATES WAKES UP AND CANT BREATHE AND NEEDS BREATHING TREATMENT  IN PACU"  . Dysrhythmia    palpitations- "from thyroid"  . Fibromyalgia   . Fracture of right foot   . Gastric ulcer 2018  . GERD (gastroesophageal reflux disease)   . Migraine   . Myofacial muscle pain   . Obesity   . OSA (obstructive sleep apnea)    Has CPAP  . Sleep apnea     NO CPAP--could not tolerates mask  . Thyroid disease   . Viral respiratory infection 06/26/13   ? influenza    Patient Active Problem List   Diagnosis Date Noted  . Chronic migraine without aura, with intractable migraine, so stated, with status migrainosus 03/06/2017  . Intractable chronic migraine without aura and without status migrainosus 09/29/2016  . Occipital neuralgia 12/12/2015  . Thyroid nodule 07/19/2013    . Right thyroid nodule 06/18/2013  . Morbid obesity ([place]) 05/01/2013  . Asthma, chronic 02/01/2013  . Smoker 02/01/2013    Past Surgical History:  Procedure Laterality Date  . ENDOMETRIAL ABLATION    . GALLBLADDER SURGERY  06/2016  . 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 NASAL SEPTUM SURGERY    . THYROID LOBECTOMY Right 07/19/2013   Procedure: THYROID LOBECTOMY;  Surgeon: Harl Bowie, MD;  Location: [facility];  Service: General;  Laterality: Right;  . TUBAL LIGATION    . WISDOM TOOTH EXTRACTION      OB History   None      Home Medications    Prior to Admission medications   Medication Sig Start Date End Date Taking? Authorizing Provider  albuterol (PROVENTIL HFA;VENTOLIN HFA) 108 (90 BASE) MCG/ACT inhaler Inhale 1 puff into the lungs every 6 (six) hours as needed for wheezing or shortness of breath.    [provider]  albuterol (PROVENTIL) (2.5 MG/3ML) 0.083% nebulizer solution Take 2.5 mg by nebulization every 6 (six) hours as needed for wheezing or shortness of breath.    [provider]  amitriptyline (ELAVIL) 25 MG tablet Take 4 tablets (100 mg total) by mouth at bedtime. 03/03/17   Melvenia Beam, MD  dicyclomine (BENTYL) 20 MG tablet Take 20 mg by mouth. 09/10/16   [provider]  FERROUS SULFATE PO Take 150 mg by mouth 2 (  two) times daily.    [provider]  furosemide (LASIX) 20 MG tablet Take 2 tablets (40 mg total) by mouth daily. Patient taking differently: Take 40 mg by mouth daily as needed for fluid.  10/03/14   Etta Quill, NP  Gabapentin 300 MG/6ML SOLN Take 900 mg by mouth. 18 mL 05/21/16   [provider]  Galcanezumab-gnlm (EMGALITY) 120 MG/ML SOAJ Inject 120 mg into the skin every 30 (thirty) days. 05/05/17   Melvenia Beam, MD  LORazepam (ATIVAN) 1 MG tablet Take 1 mg by mouth 2 (two) times daily as needed for anxiety.     [provider]  mometasone-formoterol (DULERA) 100-5 MCG/ACT AERO Inhale 2 puffs  into the lungs 2 (two) times daily. 02/24/17   [provider]  OxyCODONE ER (XTAMPZA ER) 9 MG C12A Take 1 tablet by mouth every 12 (twelve) hours.    [provider]  oxyCODONE-acetaminophen (PERCOCET/ROXICET) 5-325 MG per tablet Take 1-2 tablets by mouth every 6 (six) hours as needed for severe pain. Patient taking differently: Take 1 tablet by mouth daily as needed.  04/27/14   Mabe, Forbes Cellar, MD  pantoprazole (PROTONIX) 40 MG tablet Take 40 mg by mouth 2 (two) times daily.     [provider]  promethazine (PHENERGAN) 25 MG tablet Take 25 mg by mouth every 4 (four) hours as needed for nausea or vomiting.    [provider]  tiZANidine (ZANAFLEX) 4 MG tablet TAKE 1-2 TABLETS (4-8 MG TOTAL) BY MOUTH EVERY 8 (EIGHT) HOURS AS NEEDED FOR MUSCLE SPASMS. 06/20/17   Melvenia Beam, MD  topiramate (TOPAMAX) 200 MG tablet Take 1 tablet (200 mg total) by mouth at bedtime. 07/28/17   Melvenia Beam, MD    Family History Family History  Problem Relation Age of Onset  . Hypertension Mother   . Hypertension Father   . Prostate cancer Father   . Diabetes Sister   . Cancer Paternal Grandmother        breast    Social History Social History   Tobacco Use  . Smoking status: Former Smoker    Packs/day: 0.00    Years: 0.00    Pack years: 0.00    Types: Cigarettes    Last attempt to quit: 11/2015    Years since quitting: 1.7  . Smokeless tobacco: Never Used  Substance Use Topics  . Alcohol use: No  . Drug use: No     Allergies   Beta adrenergic blockers; Nsaids; and Tolmetin   Review of Systems Review of Systems  Reason unable to perform ROS: See HPI as above.     Physical Exam Triage Vital Signs ED Triage Vitals  Enc Vitals Group     BP 07/29/17 1736 115/75     Pulse Rate 07/29/17 1736 77     Resp --      Temp 07/29/17 1736 98.3 F (36.8 C)     Temp Source 07/29/17 1736 Oral     SpO2 07/29/17 1736 97 %     Weight --      Height --       Head Circumference --      Peak Flow --      Pain Score 07/29/17 1733 8     Pain Loc --      Pain Edu? --      Excl. in [place]? --    No data found.  Updated Vital Signs BP 115/75 (BP Location: Left Arm)  Pulse 77   Temp 98.3 F (36.8 C) (Oral)   SpO2 97%   Physical Exam  Constitutional: She is oriented to person, place, and time. She appears well-developed and well-nourished. No distress.  HENT:  Head: Normocephalic and atraumatic.  Right Ear: Tympanic membrane, external ear and ear canal normal. Tympanic membrane is not erythematous and not bulging.  Left Ear: Tympanic membrane, external ear and ear canal normal. Tympanic membrane is not erythematous and not bulging.  Nose: Nose normal. Right sinus exhibits no maxillary sinus tenderness and no frontal sinus tenderness. Left sinus exhibits no maxillary sinus tenderness and no frontal sinus tenderness.  Mouth/Throat: Uvula is midline, oropharynx is clear and moist and mucous membranes are normal.  Eyes: Pupils are equal, round, and reactive to light. Conjunctivae and EOM are normal.  Neck: Normal range of motion. Neck supple.  Cardiovascular: Normal rate, regular rhythm and normal heart sounds. Exam reveals no gallop and no friction rub.  No murmur heard. Pulmonary/Chest: Effort normal and breath sounds normal. She has no decreased breath sounds. She has no wheezes. She has no rhonchi. She has no rales.  Lymphadenopathy:    She has no cervical adenopathy.  Neurological: She is alert and oriented to person, place, and time. She has normal strength. She is not disoriented. Coordination and gait normal. GCS eye subscore is 4. GCS verbal subscore is 5. GCS motor subscore is 6.  Skin: Skin is warm and dry.  Psychiatric: She has a normal mood and affect. Her behavior is normal. Judgment normal.     UC Treatments / Results  Labs (all labs ordered are listed, but only abnormal results are displayed) Labs Reviewed - No data to  display  EKG None Radiology No results found.  Procedures Procedures (including critical care time)  Medications Ordered in UC Medications  ketorolac (TORADOL) injection 60 mg (60 mg Intramuscular Given 07/29/17 1804)  dexamethasone (DECADRON) injection 10 mg (10 mg Intramuscular Given 07/29/17 1806)  diphenhydrAMINE (BENADRYL) injection 50 mg (50 mg Intramuscular Given 07/29/17 1808)     Initial Impression / Assessment and Plan / UC Course  I have reviewed the triage vital signs and the nursing notes.  Pertinent labs & imaging results that were available during my care of the patient were reviewed by me and considered in my medical decision making (see chart for details).    Toradol, decadron, benadryl injection in office today. Patient to follow up with neurologist as scheduled for further management of migraine. Return precautions given. Patient expresses understanding and agrees to plan.   Final Clinical Impressions(s) / UC Diagnoses   Final diagnoses:  Intractable migraine without aura and without status migrainosus    ED Discharge Orders    None       Arturo Morton 07/29/17 1909

## 2017-07-29 NOTE — Discharge Instructions (Signed)
Decadron, Benadryl, Toradol injection in office today.  Please follow-up with neurology for further evaluation and management as scheduled.  If experiencing worsening symptoms, thunderclap headache/worst headache in your life, weakness, dizziness, confusion/altered mental status, imbalance, passing out, go to the emergency department for further evaluation.

## 2017-07-29 NOTE — ED Triage Notes (Signed)
States takes injection of Emgality and Botox monthly.

## 2017-07-29 NOTE — ED Triage Notes (Signed)
C/o migraine onset after having surgery on Wednesday, had endometrium ablation.

## 2017-08-30 ENCOUNTER — Encounter (INDEPENDENT_AMBULATORY_CARE_PROVIDER_SITE_OTHER): Payer: Self-pay | Admitting: Orthopaedic Surgery

## 2017-08-30 ENCOUNTER — Ambulatory Visit (INDEPENDENT_AMBULATORY_CARE_PROVIDER_SITE_OTHER): Payer: Medicaid Other | Admitting: Orthopaedic Surgery

## 2017-08-30 DIAGNOSIS — M25562 Pain in left knee: Secondary | ICD-10-CM

## 2017-08-30 DIAGNOSIS — G8929 Other chronic pain: Secondary | ICD-10-CM | POA: Diagnosis not present

## 2017-08-30 DIAGNOSIS — M545 Low back pain, unspecified: Secondary | ICD-10-CM | POA: Insufficient documentation

## 2017-08-30 DIAGNOSIS — M25561 Pain in right knee: Secondary | ICD-10-CM | POA: Diagnosis not present

## 2017-08-30 NOTE — Progress Notes (Signed)
Office Visit Note   Patient: Cindy Hoffman           Date of Birth: 04/20/1976           MRN: [id number] Visit Date: 08/30/2017              Requested by: [facility] [address], [place] [address] PCP: Berkley Harvey, NP   Assessment & Plan: Visit Diagnoses:  1. Chronic low back pain, unspecified back pain laterality, with sciatica presence unspecified   2. Chronic pain of both knees     Plan: Impression is bilateral knee osteoarthritis as well as lumbar radiculopathy.  At this point, we will order an MRI of the lumbar spine.  We will also send the patient to outpatient physical therapy to work on lower extremity and core strengthening.  She will follow-up with [place] once the MRI is completed. Call with concerns or questions in the meantime Total face to face encounter time was greater than 45 minutes and over half of this time was spent in counseling and/or coordination of care.  Follow-Up Instructions: Return in about 10 days (around 09/09/2017) for mri review.   Orders:  Orders Placed This Encounter  Procedures  . MR Lumbar Spine w/o contrast   No orders of the defined types were placed in this encounter.     Procedures: No procedures performed   Clinical Data: No additional findings.   Subjective: Chief Complaint  Patient presents with  . Left Knee - Pain  . Right Knee - Pain    HPI patient is a 42 year old female who presents to our clinic today with bilateral knee pain as well as low back pain and bilateral lower extremity radiculopathy.  This is been ongoing for the past year.  She noticed increase in symptoms  following the gastric bypass surgery in January 2018.  Since then she has lost 150 pounds.  She has been worked up by rheumatology for questionable ankylosing spondylitis as well as rheumatoid arthritis.  The lab results were negative.  She does see pain management for her back.  She is on Xtampza 9 mg twice daily.  In  regards to the knees, the right knee appears to be more painful.  Both knees hurt constantly but are exacerbated with going from seated to standing positions as well as going up and down stairs.  She does note popping to the knees.  No locking or catching.  She states that she has never had an MRI or epidural steroid injections to her lumbar spine.  No previous knee injuries or cortisone injections there.    Review of Systems as detailed in HPI.  All others reviewed and are negative.   Objective: Vital Signs: There were no vitals taken for this visit.  Physical Exam well-developed well-nourished female no acute distress.  Alert and oriented x3.  Ortho Exam examination of her lumbar spine is unremarkable.  Examination of both knees show no effusion.  Range of motion 0 to 110 degrees.  Moderate patellofemoral crepitus.  Medial joint line tenderness.  Cruciates and collaterals are stable.  No focal weakness she is neurovascularly intact distally.  Specialty Comments:  No specialty comments available.  Imaging: Images reviewed by me in canopy reveal decreased joint space medial and patellofemoral compartments.  No structural abnormalities noted with lumbar spine.   PMFS History: Patient Active Problem List   Diagnosis Date Noted  . Chronic low back pain 08/30/2017  . Chronic pain  of both knees 08/30/2017  . Chronic migraine without aura, with intractable migraine, so stated, with status migrainosus 03/06/2017  . Intractable chronic migraine without aura and without status migrainosus 09/29/2016  . Occipital neuralgia 12/12/2015  . Thyroid nodule 07/19/2013  . Right thyroid nodule 06/18/2013  . Morbid obesity ([place]) 05/01/2013  . Asthma, chronic 02/01/2013  . Smoker 02/01/2013   Past Medical History:  Diagnosis Date  . Anxiety   . Asthma   . Chronic pain syndrome   . Complication of anesthesia    "STATES WAKES UP AND CANT BREATHE AND NEEDS BREATHING TREATMENT  IN PACU"  .  Dysrhythmia    palpitations- "from thyroid"  . Fibromyalgia   . Fracture of right foot   . Gastric ulcer 2018  . GERD (gastroesophageal reflux disease)   . Migraine   . Myofacial muscle pain   . Obesity   . OSA (obstructive sleep apnea)    Has CPAP  . Sleep apnea     NO CPAP--could not tolerates mask  . Thyroid disease   . Viral respiratory infection 06/26/13   ? influenza    Family History  Problem Relation Age of Onset  . Hypertension Mother   . Hypertension Father   . Prostate cancer Father   . Diabetes Sister   . Cancer Paternal Grandmother        breast    Past Surgical History:  Procedure Laterality Date  . ENDOMETRIAL ABLATION    . GALLBLADDER SURGERY  06/2016  . 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 NASAL SEPTUM SURGERY    . THYROID LOBECTOMY Right 07/19/2013   Procedure: THYROID LOBECTOMY;  Surgeon: Harl Bowie, MD;  Location: [facility];  Service: General;  Laterality: Right;  . TUBAL LIGATION    . WISDOM TOOTH EXTRACTION     Social History   Occupational History  . Occupation: unemployed  Tobacco Use  . Smoking status: Former Smoker    Packs/day: 0.00    Years: 0.00    Pack years: 0.00    Types: Cigarettes    Last attempt to quit: 11/2015    Years since quitting: 1.8  . Smokeless tobacco: Never Used  Substance and Sexual Activity  . Alcohol use: No  . Drug use: No  . Sexual activity: Not on file

## 2017-09-08 ENCOUNTER — Ambulatory Visit: Payer: Medicaid Other | Admitting: Adult Health

## 2017-09-08 ENCOUNTER — Encounter: Payer: Self-pay | Admitting: Adult Health

## 2017-09-08 VITALS — BP 111/76 | HR 66 | Ht 64.5 in | Wt 221.0 lb

## 2017-09-08 DIAGNOSIS — G43719 Chronic migraine without aura, intractable, without status migrainosus: Secondary | ICD-10-CM

## 2017-09-08 NOTE — Patient Instructions (Signed)
Your Plan:  Continue Botox, Amitriptyline, topamax and tizanidine Continue with Emgality If your symptoms worsen or you develop new symptoms please let us know.   Thank you for coming to see [place] at [facility]. I hope we have been able to provide you high quality care today.  You may receive a patient satisfaction survey over the next few weeks. We would appreciate your feedback and comments so that we may continue to improve ourselves and the health of our patients.

## 2017-09-08 NOTE — Progress Notes (Signed)
PATIENT: Cindy Hoffman DOB: [birth year]  REASON FOR VISIT: follow up HISTORY FROM: patient  HISTORY OF PRESENT ILLNESS: Today 09/08/17 Ms. Boyde is a 42 year old female with a history of chronic migraine headache.  She returns today for follow-up.  She is currently on Botox, Emgality.  Amitriptyline, tizanidine and Topamax.  She reports that with the initiation of Botox and Emgality her headaches have improved.  She states that she is having approximately 1 headache a week.  Reports her headaches typically occur in the temporal regions bilaterally.  They also sometimes will occur in the occipital region.  She does have photophobia, phonophobia as well as nausea.  She does report that with Topamax she has noticed numbness and tingling in the fingertips and toes.  She does have chronic back pain for which she sees an orthopedist.  She returns today for evaluation.  HISTORY (Copied from Dr. Cathren Laine notes) Interval history 05/05/2017: Botox denied despite prior auths and appeals. Chronic intractable migraines, multiple other neurologic symptoms, in pain management, has been to multiple neurologists and failed multiple classes of medications. She is now having side effects to Ajovy, she continues to have severe migraines.  She tried Topamax in the past but was only on 25mg  she thinks and stopped taking it.  She has chronic back pain and is on opiate medication chronically.   -Medications tried and failed or side effects: Trileptal. Topiramate. Neurontin. Amitriptyline. Trileptal. Zanaflex. Effexor, Robaxin. She cannot take beta blockers or fluoxetine due to asthma. Had side effects to Ajovy (insomnia, muscle aches)  Interval history 03/03/2017: Botox denied despite prior auths and appeals. Patient with chronic intractable migraines, without medication overuse, failed multiple classes of meds (see below), pounding, throbbing daily migraines with light and sound sensitivity, nausea, worse with  movement, unilateral, daily for years, can last 12 hours or longer. Very frustrating that we cannot provide botox. Discussed options at length, will try Ajovy and a sample was provided to patient.  Interval history 09/29/2016:Patient has a past medical history of chronic headache and neck pain, morbid obesity, asthma, hypothyroidism, anxiety, status post recent bariatric surgery, chronic low back pain. She describes having migraines for about 20 years. She is working with Dr. Rochele Pages in pain management for chronic back pain. MRI of the brain and cervical spine did not show any etiology for her symptoms. She's had multiple nerve blocks without results. She was sent to [facility] but did not want to pursue the stimulator. Based on her diffuse pain multiple laboratory studies and rheumatologic markers were ordered. [facility] also suggested fibromyalgia. Given her chronic history of migraine headaches could also try other medications such as Depakote, verapamil, diltiazem or Botox. Her amitriptyline was increased at [facility] up to 50 mg.  Patient has chronic migraines. No medication overuse. She's failed multiple medications as listed below. She continues to have daily headaches with at least 15 migraines monthly. This frequency ongoing for over a year and longer. Patient's headaches are right-sided, throbbing pounding, she has light sensitivity and sound sensitivity, she has vomited in the past. No aura. Migraines can last up to 24 hours or some days longer. On average it lasts at least 12 hours. Can be severe pain. She is very tired of suffering an she's tried everything. She has recently lost 70 pounds in hopes that this will help with her migraines but has not. Today I recommended Botox therapy. I do feel that patient would be a good candidate.  Patient reports memory changes,  she forgets things midsentence, has to write things down. I reassured patient that she does not have a degenerative  disease such as Alzheimer's, her memory complaints are likely due to chronic pain, normal aging and polypharmacy.  ANA screen negative, sedimentation rate 38, Sjogren's negative  Medications tried: Trileptal. Topiramate. Neurontin. Amitriptyline. Trileptal. Zanaflex. Effexor. She cannot take beta blockers or fluoxetine due to asthma.  Interval History 01/31/2016: She comes back for occipital neuralgiaand migraines. MRi of the brain and cervical spine unremarkable. She takes regular tylenol and this can be causing rebound however patient is still taking regardless of our instructions not to take daily or more than 2-3x in a week.Repeat MRi of the brain and cervical cord to evaluate for abnormal white matter changes NOT consistent with MS. Needs to follow with primary care for management of vascular risk factors and obesity as white matter changes likely chronic microvascular changes. MRI of the cervical spine normal. She does not take Ambien at night. She is taking muscle relaxer. She has insomnia. She has anxiety. She in on multiple medications. She is seen at a pain clinic Dr. Rochele Pages at [facility].   - For her occipital neuralgia she did not tolerate: Occipital nerve blocks, Trileptal. Medications not helping: Neurontin. Intolerant of Topamax in the past.  - Start Amitriptyline very low dose which may help with occipital neuralgia and headache, her insomnia and chronic pain  MRI brain 12/26/2015: IMPRESSION: This MRI of the brain with and without contrast shows the following: 1. A few scattered T2/FLAIR hyperintense changes in the subcortical white matter and some subtle periventricular white matter changes. This is a nonspecific finding and could represent early chronic microvascular ischemic change or be the sequela of migraine. Demyelination is less likely to give this pattern. 2. There were no acute findings and there is a normal enhancement pattern. When compared to the MRI dated  03/31/2015, there were no definite interval changes.  MRI cervical spine: normal  YOV:ZCHYIFO F Horneis a 42 y.o.femalehere as a referral from Dr. Massie Kluver occipital neuralgia. Past medical history of migraine, morbid obesity, anxiety, fibromyalgia, hypothyroidism, asthma, disc degenerative disease. She was diagnosed with Occipital Neuralgia at [facility]. Symptoms started 02/28/2015 awoke with a loud rining in the ears. No inciting event or trauma. Since June 11 it has been daily and excruciating. It is burning, aching, throbbing on the right side (points to the occipital area). Her jaw and ear hurts. She has neck pain. She endorses numbness in the left and weakness in the arms. Her mouth went numb as well. MRi of the brain in 03/2015 with abnormal white matter periventricular. She also has migraines since 1997. She had abnormal white matter changes in the brain in 03/2015. Severe 10/10 pain continuous. Worse at night. Vision changes. Headaches. Dizziness. Weakness in legs. Multiple neurologic symptoms for many years started when she was pregnant. No FHx MS or autoimmune disease   Reviewed notes, labs and imaging from outside physicians, which showed:   BMP unremarkable with normal creatinine 0.75, cbc unremarkable  Personally reviewed images and agree with the following: At least 5 small FLAIR hyperintensities in the bilateral cerebral white matter with hazy periventricular hyperintensity around the frontal horn of the left lateral ventricle and the body/atrium of the right lateral ventricle. The hazy areas have no volume loss/mass effect or enhancement. No juxta cortical, corpus callosum, or infratentorial signal abnormality. No anterior temporal involvement. No historical indication of immunocompromised state/HIV.  IMPRESSION: 1. No explanation for hearing loss. 2. Cerebral white  matter disease with nonspecific pattern, usually related to premature small vessel disease, demyelination, or  migraine.   REVIEW OF SYSTEMS: Out of a complete 14 system review of symptoms, the patient complains only of the following symptoms, and all other reviewed systems are negative.  Insomnia, frequent waking, daytime sleepiness, joint pain, back pain, aching muscles, muscle cramps, walking difficulty, neck pain, neck stiffness, nervous/anxious, memory loss, headache, numbness, abdominal pain, wheezing, blurred vision, light sensitivity, fatigue, hearing loss, ringing in ears  ALLERGIES: Allergies  Allergen Reactions  . Beta Adrenergic Blockers Anaphylaxis  . Nsaids     Other reaction(s): Other Contraindicated due to bariatric surgery  . Tolmetin Other (See Comments)    Contraindicated due to bariatric surgery Contraindicated due to bariatric surgery    HOME MEDICATIONS: Outpatient Medications Prior to Visit  Medication Sig Dispense Refill  . albuterol (PROVENTIL HFA;VENTOLIN HFA) 108 (90 BASE) MCG/ACT inhaler Inhale 1 puff into the lungs every 6 (six) hours as needed for wheezing or shortness of breath.    Marland Kitchen albuterol (PROVENTIL) (2.5 MG/3ML) 0.083% nebulizer solution Take 2.5 mg by nebulization every 6 (six) hours as needed for wheezing or shortness of breath.    Marland Kitchen amitriptyline (ELAVIL) 25 MG tablet Take 4 tablets (100 mg total) by mouth at bedtime. 60 tablet 11  . dicyclomine (BENTYL) 20 MG tablet Take 20 mg by mouth.    Lyndle Herrlich SULFATE PO Take 150 mg by mouth 2 (two) times daily.    . furosemide (LASIX) 20 MG tablet Take 2 tablets (40 mg total) by mouth daily. (Patient taking differently: Take 40 mg by mouth daily as needed for fluid. ) 20 tablet 0  . Gabapentin 300 MG/6ML SOLN Take 900 mg by mouth. 18 mL    . Galcanezumab-gnlm (EMGALITY) 120 MG/ML SOAJ Inject 120 mg into the skin every 30 (thirty) days. 1 pen 11  . LORazepam (ATIVAN) 1 MG tablet Take 1 mg by mouth 2 (two) times daily as needed for anxiety.     . mometasone-formoterol (DULERA) 100-5 MCG/ACT AERO Inhale 2 puffs into  the lungs 2 (two) times daily.    . montelukast (SINGULAIR) 10 MG tablet Take 10 mg by mouth daily.  5  . OxyCODONE ER (XTAMPZA ER) 9 MG C12A Take 1 tablet by mouth every 12 (twelve) hours.    Marland Kitchen oxyCODONE-acetaminophen (PERCOCET/ROXICET) 5-325 MG per tablet Take 1-2 tablets by mouth every 6 (six) hours as needed for severe pain. (Patient taking differently: Take 1 tablet by mouth daily as needed. ) 6 tablet 0  . pantoprazole (PROTONIX) 40 MG tablet Take 40 mg by mouth 2 (two) times daily.     . promethazine (PHENERGAN) 25 MG tablet Take 25 mg by mouth every 4 (four) hours as needed for nausea or vomiting.    Marland Kitchen tiZANidine (ZANAFLEX) 4 MG tablet TAKE 1-2 TABLETS (4-8 MG TOTAL) BY MOUTH EVERY 8 (EIGHT) HOURS AS NEEDED FOR MUSCLE SPASMS. 90 tablet 3  . topiramate (TOPAMAX) 200 MG tablet Take 1 tablet (200 mg total) by mouth at bedtime. 30 tablet 6   No facility-administered medications prior to visit.     PAST MEDICAL HISTORY: Past Medical History:  Diagnosis Date  . Anxiety   . Asthma   . Chronic pain syndrome   . Complication of anesthesia    "STATES WAKES UP AND CANT BREATHE AND NEEDS BREATHING TREATMENT  IN PACU"  . Dysrhythmia    palpitations- "from thyroid"  . Fibromyalgia   . Fracture of right  foot   . Gastric ulcer 2018  . GERD (gastroesophageal reflux disease)   . Migraine   . Myofacial muscle pain   . Obesity   . OSA (obstructive sleep apnea)    Has CPAP  . Sleep apnea     NO CPAP--could not tolerates mask  . Thyroid disease   . Viral respiratory infection 06/26/13   ? influenza    PAST SURGICAL HISTORY: Past Surgical History:  Procedure Laterality Date  . ENDOMETRIAL ABLATION    . ENDOMETRIAL ABLATION  07/17/2017  . GALLBLADDER SURGERY  06/2016  . 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 NASAL SEPTUM SURGERY    . THYROID LOBECTOMY Right 07/19/2013   Procedure: THYROID LOBECTOMY;  Surgeon: Harl Bowie, MD;  Location: [facility];  Service: General;  Laterality: Right;  .  TUBAL LIGATION    . WISDOM TOOTH EXTRACTION      FAMILY HISTORY: Family History  Problem Relation Age of Onset  . Hypertension Mother   . Hypertension Father   . Prostate cancer Father   . Diabetes Sister   . Cancer Paternal Grandmother        breast    SOCIAL HISTORY: Social History   Socioeconomic History  . Marital status: Legally Separated    Spouse name: Not on file  . Number of children: 1  . Years of education: GED  . Highest education level: Not on file  Occupational History  . Occupation: unemployed  Social Needs  . Financial resource strain: Not on file  . Food insecurity:    Worry: Not on file    Inability: Not on file  . Transportation needs:    Medical: Not on file    Non-medical: Not on file  Tobacco Use  . Smoking status: Former Smoker    Packs/day: 0.00    Years: 0.00    Pack years: 0.00    Types: Cigarettes    Last attempt to quit: 11/2015    Years since quitting: 1.8  . Smokeless tobacco: Never Used  Substance and Sexual Activity  . Alcohol use: No  . Drug use: No  . Sexual activity: Not on file  Lifestyle  . Physical activity:    Days per week: Not on file    Minutes per session: Not on file  . Stress: Not on file  Relationships  . Social connections:    Talks on phone: Not on file    Gets together: Not on file    Attends religious service: Not on file    Active member of club or organization: Not on file    Attends meetings of clubs or organizations: Not on file    Relationship status: Not on file  . Intimate partner violence:    Fear of current or ex partner: Not on file    Emotionally abused: Not on file    Physically abused: Not on file    Forced sexual activity: Not on file  Other Topics Concern  . Not on file  Social History Narrative   Lives with father   Caffeine use: rare   Right handed      PHYSICAL EXAM  Vitals:   09/08/17 0846  BP: 111/76  Pulse: 66  Weight: 221 lb (100.2 kg)  Height: 5' 4.5" (1.638 m)    Body mass index is 37.35 kg/m.  Generalized: Well developed, in no acute distress   Neurological examination  Mentation: Alert oriented to time, place, history taking. Follows all commands speech and  language fluent Cranial nerve II-XII: Pupils were equal round reactive to light. Extraocular movements were full, visual field were full on confrontational test. Facial sensation and strength were normal. Uvula tongue midline. Head turning and shoulder shrug  were normal and symmetric. Motor: The motor testing reveals 5 over 5 strength of all 4 extremities. Good symmetric motor tone is noted throughout.  Sensory: Sensory testing is intact to soft touch on all 4 extremities. No evidence of extinction is noted.  Coordination: Cerebellar testing reveals good finger-nose-finger and heel-to-shin bilaterally.  Gait and station: Patient has a slight limp on the left when ambulating.  Tandem gait not attempted. Reflexes: Deep tendon reflexes are symmetric and normal bilaterally.   DIAGNOSTIC DATA (LABS, IMAGING, TESTING) - I reviewed patient records, labs, notes, testing and imaging myself where available.  Lab Results  Component Value Date   WBC 7.6 10/12/2015   HGB 12.6 10/12/2015   HCT 40.2 10/12/2015   MCV 84.5 10/12/2015   PLT 352 10/12/2015      Component Value Date/Time   NA 136 10/12/2015 2340   K 4.6 10/12/2015 2340   CL 103 10/12/2015 2340   CO2 23 10/12/2015 2340   GLUCOSE 122 (H) 10/12/2015 2340   BUN 8 10/12/2015 2340   CREATININE 0.75 10/12/2015 2340   CALCIUM 8.7 (L) 10/12/2015 2340   PROT 7.6 01/04/2015 1338   ALBUMIN 3.7 01/04/2015 1338   AST 14 (L) 01/04/2015 1338   ALT 12 (L) 01/04/2015 1338   ALKPHOS 78 01/04/2015 1338   BILITOT 0.3 01/04/2015 1338   GFRNONAA >60 10/12/2015 2340   GFRAA >60 10/12/2015 2340     ASSESSMENT AND PLAN 42 y.o. year old female  has a past medical history of Anxiety, Asthma, Chronic pain syndrome, Complication of anesthesia,  Dysrhythmia, Fibromyalgia, Fracture of right foot, Gastric ulcer (2018), GERD (gastroesophageal reflux disease), Migraine, Myofacial muscle pain, Obesity, OSA (obstructive sleep apnea), Sleep apnea, Thyroid disease, and Viral respiratory infection (06/26/13). here with :  1.  Chronic migraine headache  The patient's headache frequency has improved with the initiation of Botox and emgality.  The patient will continue on Topamax, tizanidine and amitriptyline.  She has her next Botox injection later this month with Dr. Jaynee Eagles.  She is advised that if her headache frequency or severity worsen she should let us know.  She will follow-up in 6 months or sooner if needed.   I spent 15 minutes with the patient. 50% of this time was spent discussing her migraine frequency and preventative medication.   Ward Givens, MSN, NP-C 09/08/2017, 8:54 AM [facility] [address], [place], [place] [address] [phone]

## 2017-09-09 NOTE — Progress Notes (Signed)
Personally  participated in, made any corrections needed, and agree with history, physical, neuro exam,assessment and plan as stated above.    Nyjah Denio, MD [facility] 

## 2017-09-12 ENCOUNTER — Ambulatory Visit (INDEPENDENT_AMBULATORY_CARE_PROVIDER_SITE_OTHER): Payer: Medicaid Other | Admitting: Orthopaedic Surgery

## 2017-09-13 ENCOUNTER — Telehealth: Payer: Self-pay | Admitting: Adult Health

## 2017-09-13 ENCOUNTER — Telehealth (INDEPENDENT_AMBULATORY_CARE_PROVIDER_SITE_OTHER): Payer: Self-pay | Admitting: Orthopaedic Surgery

## 2017-09-13 NOTE — Telephone Encounter (Signed)
Cindy Hoffman/[facility] [phone] called to advise botox needs PA. Please call [place] [facility] before it can be shipped out. Thank you

## 2017-09-13 NOTE — Telephone Encounter (Signed)
See message.

## 2017-09-13 NOTE — Telephone Encounter (Signed)
As in T and L spine?

## 2017-09-13 NOTE — Telephone Encounter (Signed)
Patient said MRI order needs to be changed to her whole back, can you do this?

## 2017-09-13 NOTE — Telephone Encounter (Signed)
Noted  

## 2017-09-14 ENCOUNTER — Other Ambulatory Visit (INDEPENDENT_AMBULATORY_CARE_PROVIDER_SITE_OTHER): Payer: Self-pay

## 2017-09-14 ENCOUNTER — Ambulatory Visit
Admission: RE | Admit: 2017-09-14 | Discharge: 2017-09-14 | Disposition: A | Discharge status: Home / Self Care | Payer: Medicaid Other | Source: Ambulatory Visit | Attending: Orthopaedic Surgery | Admitting: Orthopaedic Surgery

## 2017-09-14 DIAGNOSIS — M545 Low back pain: Principal | ICD-10-CM

## 2017-09-14 DIAGNOSIS — G8929 Other chronic pain: Secondary | ICD-10-CM

## 2017-09-14 DIAGNOSIS — M549 Dorsalgia, unspecified: Secondary | ICD-10-CM

## 2017-09-14 NOTE — Telephone Encounter (Signed)
Do you want her to have both done? States she is also having pain in the T-spine area.   She is on her way now to have L-spine MRI

## 2017-09-16 ENCOUNTER — Other Ambulatory Visit: Payer: Self-pay | Admitting: Neurology

## 2017-09-16 ENCOUNTER — Ambulatory Visit (INDEPENDENT_AMBULATORY_CARE_PROVIDER_SITE_OTHER): Payer: Medicaid Other | Admitting: Orthopaedic Surgery

## 2017-09-16 DIAGNOSIS — G8929 Other chronic pain: Secondary | ICD-10-CM

## 2017-09-16 DIAGNOSIS — M545 Low back pain: Secondary | ICD-10-CM | POA: Diagnosis not present

## 2017-09-16 NOTE — Progress Notes (Signed)
Office Visit Note   Patient: Cindy Hoffman           Date of Birth: [birth year]           MRN: [id number] Visit Date: 09/16/2017              Requested by: Berkley Harvey, NP [place], [place] [id number] PCP: Berkley Harvey, NP   Assessment & Plan: Visit Diagnoses:  1. Chronic low back pain, unspecified back pain laterality, with sciatica presence unspecified     Plan: MRI shows a small superiorly extruded T11-T12 disc without any significant impingement.  Not able to see this on the axial cuts given the limits of the MRI.  We are waiting on the thoracic spine MRI to give [place] better detail.  From a lumbar spine standpoint I do not see any pathology that would cause any impingement or symptoms that she is reporting.  We will see her back to review the thoracic spine MRI.  She states that she did have a previous cervical spine MRI and she reacted to the injection so therefore she is not really interested in another injection. Total face to face encounter time was greater than 25 minutes and over half of this time was spent in counseling and/or coordination of care.   Follow-Up Instructions: Return if symptoms worsen or fail to improve.   Orders:  No orders of the defined types were placed in this encounter.  No orders of the defined types were placed in this encounter.     Procedures: No procedures performed   Clinical Data: No additional findings.   Subjective: Chief Complaint  Patient presents with  . Lower Back - Pain    Patient is here to review her lumbar spine.  She continues to complain of low back pain with radiation down her right leg.  Exam is stable.   Review of Systems   Objective: Vital Signs: There were no vitals taken for this visit.  Physical Exam  Ortho Exam Exam is stable Specialty Comments:  No specialty comments available.  Imaging: No results found.   PMFS History: Patient Active Problem List   Diagnosis  Date Noted  . Chronic low back pain 08/30/2017  . Chronic pain of both knees 08/30/2017  . Chronic migraine without aura, with intractable migraine, so stated, with status migrainosus 03/06/2017  . Intractable chronic migraine without aura and without status migrainosus 09/29/2016  . Occipital neuralgia 12/12/2015  . Thyroid nodule 07/19/2013  . Right thyroid nodule 06/18/2013  . Morbid obesity ([place]) 05/01/2013  . Asthma, chronic 02/01/2013  . Smoker 02/01/2013   Past Medical History:  Diagnosis Date  . Anxiety   . Asthma   . Chronic pain syndrome   . Complication of anesthesia    "STATES WAKES UP AND CANT BREATHE AND NEEDS BREATHING TREATMENT  IN PACU"  . Dysrhythmia    palpitations- "from thyroid"  . Fibromyalgia   . Fracture of right foot   . Gastric ulcer 2018  . GERD (gastroesophageal reflux disease)   . Migraine   . Myofacial muscle pain   . Obesity   . OSA (obstructive sleep apnea)    Has CPAP  . Sleep apnea     NO CPAP--could not tolerates mask  . Thyroid disease   . Viral respiratory infection 06/26/13   ? influenza    Family History  Problem Relation Age of Onset  . Hypertension Mother   .  Hypertension Father   . Prostate cancer Father   . Diabetes Sister   . Cancer Paternal Grandmother        breast    Past Surgical History:  Procedure Laterality Date  . ENDOMETRIAL ABLATION    . ENDOMETRIAL ABLATION  07/17/2017  . GALLBLADDER SURGERY  06/2016  . 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 NASAL SEPTUM SURGERY    . THYROID LOBECTOMY Right 07/19/2013   Procedure: THYROID LOBECTOMY;  Surgeon: Harl Bowie, MD;  Location: [facility];  Service: General;  Laterality: Right;  . TUBAL LIGATION    . WISDOM TOOTH EXTRACTION     Social History   Occupational History  . Occupation: unemployed  Tobacco Use  . Smoking status: Former Smoker    Packs/day: 0.00    Years: 0.00    Pack years: 0.00    Types: Cigarettes    Last attempt to quit: 11/2015    Years since  quitting: 1.8  . Smokeless tobacco: Never Used  Substance and Sexual Activity  . Alcohol use: No  . Drug use: No  . Sexual activity: Not on file

## 2017-09-21 ENCOUNTER — Telehealth: Payer: Self-pay | Admitting: Neurology

## 2017-09-21 ENCOUNTER — Ambulatory Visit: Payer: Medicaid Other | Admitting: Neurology

## 2017-09-21 VITALS — BP 116/74 | HR 83

## 2017-09-21 DIAGNOSIS — G43711 Chronic migraine without aura, intractable, with status migrainosus: Secondary | ICD-10-CM | POA: Diagnosis not present

## 2017-09-21 DIAGNOSIS — M62838 Other muscle spasm: Secondary | ICD-10-CM

## 2017-09-21 DIAGNOSIS — S161XXA Strain of muscle, fascia and tendon at neck level, initial encounter: Secondary | ICD-10-CM

## 2017-09-21 DIAGNOSIS — M26609 Unspecified temporomandibular joint disorder, unspecified side: Secondary | ICD-10-CM

## 2017-09-21 NOTE — Progress Notes (Signed)
DOing exceptionally well. > 50% reductionin migraine frequency and severity. prescribe emgality. +masseters, +temples. + extra right occipital and spread out across the traps 4-5 places each.  Needs PT for dry needling cervical muscles and masseters for TMJ.  Orders Placed This Encounter  Procedures  . Ambulatory referral to Physical Therapy      Consent Form Botulism Toxin Injection For Chronic Migraine  Botulism toxin has been approved by the Federal drug administration for treatment of chronic migraine. Botulism toxin does not cure chronic migraine and it may not be effective in some patients.  The administration of botulism toxin is accomplished by injecting a small amount of toxin into the muscles of the neck and head. Dosage must be titrated for each individual. Any benefits resulting from botulism toxin tend to wear off after 3 months with a repeat injection required if benefit is to be maintained. Injections are usually done every 3-4 months with maximum effect peak achieved by about 2 or 3 weeks. Botulism toxin is expensive and you should be sure of what costs you will incur resulting from the injection.  The side effects of botulism toxin use for chronic migraine may include:   -Transient, and usually mild, facial weakness with facial injections  -Transient, and usually mild, head or neck weakness with head/neck injections  -Reduction or loss of forehead facial animation due to forehead muscle              weakness  -Eyelid drooping  -Dry eye  -Pain at the site of injection or bruising at the site of injection  -Double vision  -Potential unknown long term risks  Contraindications: You should not have Botox if you are pregnant, nursing, allergic to albumin, have an infection, skin condition, or muscle weakness at the site of the injection, or have myasthenia gravis, Lambert-Eaton syndrome, or ALS.  It is also possible that as with any injection, there may be an allergic  reaction or no effect from the medication. Reduced effectiveness after repeated injections is sometimes seen and rarely infection at the injection site may occur. All care will be taken to prevent these side effects. If therapy is given over a long time, atrophy and wasting in the muscle injected may occur. Occasionally the patient's become refractory to treatment because they develop antibodies to the toxin. In this event, therapy needs to be modified.  I have read the above information and consent to the administration of botulism toxin.    ______________  _____   _________________  Patient signature     Date   Witness signature       BOTOX PROCEDURE NOTE FOR MIGRAINE HEADACHE    Contraindications and precautions discussed with patient(above). Aseptic procedure was observed and patient tolerated procedure. Procedure performed by Dr. Georgia Dom  The condition has existed for more than 6 months, and pt does not have a diagnosis of ALS, Myasthenia Gravis or Lambert-Eaton Syndrome. Risks and benefits of injections discussed and pt agrees to proceed with the procedure. Written consent obtained  These injections are medically necessary. He receives good benefits from these injections. These injections do not cause sedations or hallucinations which the oral therapies may cause.  Indication/Diagnosis: chronic migraine BOTOX(J0585) injection was performed according to protocol by [employer]. 200 units of BOTOX was dissolved into 4 cc NS.  NDC: [address]-51   Description of procedure:  The patient was placed in a sitting position. The standard protocol was used for Botox as follows, with 5 units of  Botox injected at each site:   -Procerus muscle, midline injection  -Corrugator muscle, bilateral injection  -Frontalis muscle, bilateral injection, with 2 sites each side, medial injection was performed in the upper one third of the frontalis muscle, in the region vertical from the medial  inferior edge of the superior orbital rim. The lateral injection was again in the upper one third of the forehead vertically above the lateral limbus of the cornea, 1.5 cm lateral to the medial injection site.  -Temporalis muscle injection, 4 sites, bilaterally. The first injection was 3 cm above the tragus of the ear, second injection site was 1.5 cm to 3 cm up from the first injection site in line with the tragus of the ear. The third injection site was 1.5-3 cm forward between the first 2 injection sites. The fourth injection site was 1.5 cm posterior to the second injection site.  -Occipitalis muscle injection, 3 sites, bilaterally. The first injection was done one half way between the occipital protuberance and the tip of the mastoid process behind the ear. The second injection site was done lateral and superior to the first, 1 fingerbreadth from the first injection. The third injection site was 1 fingerbreadth superiorly and medially from the first injection site.  -Cervical paraspinal muscle injection, 2 sites, bilateral knee first injection site was 1 cm from the midline of the cervical spine, 3 cm inferior to the lower border of the occipital protuberance. The second injection site was 1.5 cm superiorly and laterally to the first injection site.  -Trapezius muscle injection was performed at 3 sites, bilaterally. The first injection site was in the upper trapezius muscle halfway between the inflection point of the neck, and the acromion. The second injection site was one half way between the acromion and the first injection site. The third injection was done between the first injection site and the inflection point of the neck.   Will return for repeat injection in 3 months.   A 200 unit sof Botox was used, 155 units were injected, the rest of the Botox was wasted. The patient tolerated the procedure well, there were no complications of the above procedure.

## 2017-09-21 NOTE — Progress Notes (Signed)
Botox-100unitsx2 vials Lot: [id number] Expiration: 01/2020 NDC: 2725-3664-40 [id number]  0.9% Sodium Chloride- 39mL total Lot: [id number] Expiration: 10/2017 NDC: 3295-1884-16  Dx: S06.301  S/P

## 2017-09-21 NOTE — Telephone Encounter (Signed)
12 wk botox inj °

## 2017-09-22 MED ORDER — GALCANEZUMAB-GNLM 120 MG/ML ~~LOC~~ SOAJ: 120.0000 mg | SUBCUTANEOUS | 11 refills | Status: DC

## 2017-09-22 NOTE — Telephone Encounter (Signed)
I called to schedule the patient, she did not answer so I left a VM asking her to call me back.  °

## 2017-09-27 ENCOUNTER — Telehealth: Payer: Self-pay | Admitting: *Deleted

## 2017-09-27 NOTE — Telephone Encounter (Signed)
Emgality PA papers successfully faxed to [facility] [employer]. Must call in 24 hours for determination. [phone]

## 2017-09-27 NOTE — Telephone Encounter (Signed)
Received fax , need PA for Emgality. Forms completed, on Dr Cathren Laine desk for signature.

## 2017-09-28 ENCOUNTER — Ambulatory Visit: Payer: Medicaid Other | Attending: Neurology | Admitting: Physical Therapy

## 2017-09-28 ENCOUNTER — Other Ambulatory Visit: Payer: Self-pay

## 2017-09-28 ENCOUNTER — Encounter: Payer: Self-pay | Admitting: Physical Therapy

## 2017-09-28 DIAGNOSIS — R293 Abnormal posture: Secondary | ICD-10-CM | POA: Diagnosis present

## 2017-09-28 DIAGNOSIS — M26609 Unspecified temporomandibular joint disorder, unspecified side: Secondary | ICD-10-CM | POA: Insufficient documentation

## 2017-09-28 DIAGNOSIS — M542 Cervicalgia: Secondary | ICD-10-CM | POA: Diagnosis present

## 2017-09-28 DIAGNOSIS — M62838 Other muscle spasm: Secondary | ICD-10-CM | POA: Insufficient documentation

## 2017-09-28 NOTE — Addendum Note (Signed)
Addended by: Larey Days on: 09/28/2017 06:06 PM   Modules accepted: Orders

## 2017-09-28 NOTE — Therapy (Signed)
[place], [place], [address] Phone: [phone]   Fax:  [phone]  Physical Therapy Evaluation  Patient Details  Name: Cindy Hoffman MRN: [id number] Date of Birth: 1976/04/15 Referring Provider: Sarina Ill MD   Encounter Date: 09/28/2017  PT End of Session - 09/28/17 1722    Visit Number  1    Number of Visits  13    Date for PT Re-Evaluation  11/23/17    Authorization Type  MCD( REassessed/ submit at 4th visit)    PT Start Time  1546    PT Stop Time  1631    PT Time Calculation (min)  45 min    Activity Tolerance  Patient tolerated treatment well    Behavior During Therapy  [facility] for tasks assessed/performed       Past Medical History:  Diagnosis Date  . Anxiety   . Asthma   . Chronic pain syndrome   . Complication of anesthesia    "STATES WAKES UP AND CANT BREATHE AND NEEDS BREATHING TREATMENT  IN PACU"  . Dysrhythmia    palpitations- "from thyroid"  . Fibromyalgia   . Fracture of right foot   . Gastric ulcer 2018  . GERD (gastroesophageal reflux disease)   . Migraine   . Myofacial muscle pain   . Obesity   . Occipital neuralgia 2016  . OSA (obstructive sleep apnea)    Has CPAP  . Sleep apnea     NO CPAP--could not tolerates mask  . Thyroid disease   . Viral respiratory infection 06/26/13   ? influenza    Past Surgical History:  Procedure Laterality Date  . ENDOMETRIAL ABLATION    . ENDOMETRIAL ABLATION  07/17/2017  . GALLBLADDER SURGERY  06/2016  . 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 NASAL SEPTUM SURGERY    . THYROID LOBECTOMY Right 07/19/2013   Procedure: THYROID LOBECTOMY;  Surgeon: Harl Bowie, MD;  Location: [facility];  Service: General;  Laterality: Right;  . TUBAL LIGATION    . WISDOM TOOTH EXTRACTION      There were no vitals filed for this visit.   Subjective Assessment - 09/28/17 1555    Subjective  pt is 42 y.o F with CC of neck pain that has been going on  for years ago when she was pregnant that started in the low back and worked up the back into the neck with the jaw started about the same time. since onset the pain seems to progressively worsen over the years. hx migraines but they have improved but still has daily headahces and weekly migraines. reports hx or grinding teeth and issues on both sides of the.  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  Recently had botox in both masseter muscles pt notes signifcant worsening of tightness due to anxiety.     Pertinent History  reports 1/2 deaf in R ear     How long can you sit comfortably?  10 min     How long can you stand comfortably?  5- 10 min     How long can you walk comfortably?  5- 10 min    Diagnostic tests  MRI low back     Patient Stated Goals  to get relaxation and relief, relieve pain     Currently in Pain?  No/denies    Pain Score  3  more stiffness than pain     Pain Location  Neck  Pain Orientation  Right    Pain Descriptors / Indicators  -- stiffness    Pain Type  Chronic pain    Pain Onset  More than a month ago    Pain Frequency  Constant    Aggravating Factors   doing too much, looking up    Pain Relieving Factors  going too the pool, heat,     Effect of Pain on Daily Activities  limited neck mobility     Multiple Pain Sites  Yes    Pain Score  3 more stiffness than pain     Pain Location  Jaw    Pain Orientation  Right;Left    Pain Descriptors / Indicators  Tightness stiffness    Pain Type  Chronic pain    Pain Onset  More than a month ago    Pain Frequency  Constant    Aggravating Factors   sleeping    Pain Relieving Factors  N/A         [facility] PT Assessment - 09/28/17 1607      Assessment   Medical Diagnosis  cervical myofascial pain, TMJ and masseter pain     Referring Provider  Sarina Ill MD    Onset Date/Surgical Date  -- 8 years ago     Hand Dominance  Right    Next MD Visit  unsure    Prior Therapy  yes for back      Precautions    Precautions  None      Restrictions   Weight Bearing Restrictions  No      Balance Screen   Has the patient fallen in the past 6 months  Yes    How many times?  3    Has the patient had a decrease in activity level because of a fear of falling?   No    Is the patient reluctant to leave their home because of a fear of falling?   No      Home Film/video editor residence    Freight forwarder;Other relatives;Children dad and brother    Available Help at Discharge  Family;Available PRN/intermittently;Available 24 hours/day    Type of Home  House    Home Access  Stairs to enter    Entrance Stairs-Number of Steps  3    Entrance Stairs-Rails  None    Home Layout  One level      Prior Function   Level of Independence  Independent    Vocation  Unemployed working on disability      Cognition   Overall Cognitive Status  Within Functional Limits for tasks assessed      Observation/Other Assessments   Observations  R lateral deviaton at end range mandibular depression,       Posture/Postural Control   Posture/Postural Control  Postural limitations    Postural Limitations  Rounded Shoulders;Forward head      ROM / Strength   AROM / PROM / Strength  AROM      AROM   Overall AROM Comments  protrusion/ retrusion 5 mm    AROM Assessment Site  Jaw;Cervical    Cervical Flexion  20    Cervical Extension  30 ERP    Cervical - Right Side Bend  30 popping noted during motiong    Cervical - Left Side Bend  30 popping during motion    Cervical - Right Rotation  55    Cervical - Left Rotation  40  Jaw-Incisal Opening   47mm    Jaw-Left Lateral Excurison  26mm    Jaw-Right Lateral Excursion  62mm      Palpation   Palpation comment  TTP sub-occipitals, bil upper trap/ levator tightness and masseter tightness                Objective measurements completed on examination: See above findings.              PT Education - 09/28/17  1721    Education provided  Yes    Education Details  evaluation findings, POC, goals, HEP with proper form / rationale, anatomy of neck and musculature    Person(s) Educated  Patient    Methods  Explanation;Verbal cues;Handout    Comprehension  Verbalized understanding;Verbal cues required       PT Short Term Goals - 09/28/17 1730      PT SHORT TERM GOAL #1   Title  pt to be I with initial HEP     Baseline  no previous HEp     Time  3    Period  Weeks    Status  New    Target Date  10/19/17      PT SHORT TERM GOAL #2   Title  pt to verbalize/ demo proper posture to prevent and reduce neck and jaw pain     Baseline  decrease knowledge of proper posture    Time  3    Period  Weeks    Status  New    Target Date  10/19/17      PT SHORT TERM GOAL #3   Title  reduce masseter / upper trap and surrounding muscle spasm to promote pain relief to  2/10 and promote cervical mobility     Baseline  signifcant spasm in masseter and bil upper traps pain at 3/10    Time  3    Period  Weeks    Status  New    Target Date  10/19/17        PT Long Term Goals - 09/28/17 1732      PT LONG TERM GOAL #1   Title  increase all cervical motions by >/= 10 degrees with </= 2/10 pain for functional mobility for safety with driving     Baseline  flexion 20, ext 30, R sidebend/ L sidebend 30, R rotation 55, L rotation 40     Time  6    Period  Weeks    Status  New    Target Date  11/09/17      PT LONG TERM GOAL #2   Title  pt to report decrease HA frequency to </= 1 weekly and migraines 1 every 2 weeks to demo improvement in condition    Baseline  HA daily and Migraines weekly    Time  6    Period  Weeks    Status  New    Target Date  11/09/17      PT LONG TERM GOAL #3   Title  pt to exhibit equal opening of the mandibal with no curvature or deflection noted to demonstrate improvement in R TMJ and reduced masseter tightness with </= 1/10 pain for QOL    Baseline  C curvature to the R with  3/10 pain noted    Time  6    Period  Weeks    Status  New    Target Date  11/09/17      PT LONG TERM  GOAL #4   Title  pt to be I with all HEP given as of last visit to maintain and progress current level of function     Baseline  no previous HEp     Time  6    Period  Weeks    Status  New    Target Date  11/09/17             Plan - 09/28/17 1724    Clinical Impression Statement  pt presents to OPPT with CC of chronic neck and TMJ pain that has been present for 8 years since her pregnancy with her daughter. limited neck mobility secondary to pain in the specifcally with extension. TMJ motions where with functional limits but pt does demonstrate a C-curve with deviation to the R at end range. TTP noted along bil upper trap, levator scap, sub-occipitals and masseters. she would benefit from physical therapy to decrease neck / jaw pain, improve mobilty, and reduce muscle tension to maximize function by addressing the deficits listed.     History and Personal Factors relevant to plan of care:  hx or anxiety causing increased tension    Clinical Presentation  Evolving    Clinical Presentation due to:  chronic neck/ TMJ pain, abnormal posture, HA,     Clinical Decision Making  Moderate    Rehab Potential  Good    PT Frequency  2x / week    PT Duration  6 weeks    PT Treatment/Interventions  Cryotherapy;Electrical Stimulation;Moist Heat;Ultrasound;Patient/family education;Therapeutic exercise;Manual techniques;ADLs/Self Care Home Management;Iontophoresis 4mg /ml Dexamethasone;Traction;Therapeutic activities;Taping;Passive range of motion;Dry needling    PT Next Visit Plan  review and update HEP, trial DN for masseter, and upper traps/ sub-occipitals, posture training, thoracic mobility, modalities PRN    PT Home Exercise Plan  upper cervical rotation, chin tuck, upper trap, tongue resting position.     Consulted and Agree with Plan of Care  Patient       Patient will benefit from  skilled therapeutic intervention in order to improve the following deficits and impairments:  Decreased range of motion, Increased muscle spasms, Impaired flexibility, Improper body mechanics, Postural dysfunction, Pain, Decreased endurance  Visit Diagnosis: Cervicalgia  TMJ (temporomandibular joint disorder)  Abnormal posture  Other muscle spasm     Problem List Patient Active Problem List   Diagnosis Date Noted  . Chronic low back pain 08/30/2017  . Chronic pain of both knees 08/30/2017  . Chronic migraine without aura, with intractable migraine, so stated, with status migrainosus 03/06/2017  . Intractable chronic migraine without aura and without status migrainosus 09/29/2016  . Occipital neuralgia 12/12/2015  . Thyroid nodule 07/19/2013  . Right thyroid nodule 06/18/2013  . Morbid obesity ([place]) 05/01/2013  . Asthma, chronic 02/01/2013  . Smoker 02/01/2013   Starr Lake PT, DPT, LAT, ATC  09/28/17  5:43 PM      [facility] [address] [place], [place], [address] Phone: [phone]   Fax:  [phone]  Name: LUISA LOUK MRN: [id number] Date of Birth: 07/08/1975

## 2017-09-29 NOTE — Telephone Encounter (Signed)
Received PA approval for [place], [facility] [employer], approved 09/27/17 - 09/22/18, conf #[id number]

## 2017-10-03 ENCOUNTER — Other Ambulatory Visit: Payer: Self-pay | Admitting: Neurology

## 2017-10-05 ENCOUNTER — Ambulatory Visit: Payer: Medicaid Other | Attending: Neurology | Admitting: Physical Therapy

## 2017-10-05 ENCOUNTER — Encounter: Payer: Self-pay | Admitting: Physical Therapy

## 2017-10-05 DIAGNOSIS — M26609 Unspecified temporomandibular joint disorder, unspecified side: Secondary | ICD-10-CM | POA: Diagnosis present

## 2017-10-05 DIAGNOSIS — G44221 Chronic tension-type headache, intractable: Secondary | ICD-10-CM

## 2017-10-05 DIAGNOSIS — M542 Cervicalgia: Secondary | ICD-10-CM | POA: Diagnosis present

## 2017-10-05 DIAGNOSIS — R293 Abnormal posture: Secondary | ICD-10-CM | POA: Diagnosis present

## 2017-10-05 DIAGNOSIS — M62838 Other muscle spasm: Secondary | ICD-10-CM

## 2017-10-05 NOTE — Therapy (Signed)
[place], [place], [address] Phone: [phone]   Fax:  [phone]  Physical Therapy Treatment  Patient Details  Name: Cindy Hoffman MRN: [id number] Date of Birth: 1975/10/29 Referring Provider: Sarina Ill MD   Encounter Date: 10/05/2017  PT End of Session - 10/05/17 1249    Visit Number  2    Number of Visits  13    Date for PT Re-Evaluation  11/23/17    PT Start Time  2831    PT Stop Time  1236    PT Time Calculation (min)  48 min    Activity Tolerance  Patient tolerated treatment well    Behavior During Therapy  [facility] for tasks assessed/performed       Past Medical History:  Diagnosis Date  . Anxiety   . Asthma   . Chronic pain syndrome   . Complication of anesthesia    "STATES WAKES UP AND CANT BREATHE AND NEEDS BREATHING TREATMENT  IN PACU"  . Dysrhythmia    palpitations- "from thyroid"  . Fibromyalgia   . Fracture of right foot   . Gastric ulcer 2018  . GERD (gastroesophageal reflux disease)   . Migraine   . Myofacial muscle pain   . Obesity   . Occipital neuralgia 2016  . OSA (obstructive sleep apnea)    Has CPAP  . Sleep apnea     NO CPAP--could not tolerates mask  . Thyroid disease   . Viral respiratory infection 06/26/13   ? influenza    Past Surgical History:  Procedure Laterality Date  . ENDOMETRIAL ABLATION    . ENDOMETRIAL ABLATION  07/17/2017  . GALLBLADDER SURGERY  06/2016  . 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 NASAL SEPTUM SURGERY    . THYROID LOBECTOMY Right 07/19/2013   Procedure: THYROID LOBECTOMY;  Surgeon: Harl Bowie, MD;  Location: [facility];  Service: General;  Laterality: Right;  . TUBAL LIGATION    . WISDOM TOOTH EXTRACTION      There were no vitals filed for this visit.  Subjective Assessment - 10/05/17 1154    Subjective  Getting allergy test  soon.  needs to stop some of her meds prior.      Currently in Pain?  Yes    Pain Score  1  increases   to 8/10 yesterday    Pain Location  Neck    Pain Orientation  Right;Left    Pain Descriptors / Indicators  Headache;Aching;Throbbing;Constant;Sharp    Pain Radiating Towards  into eyes    Pain Frequency  Constant    Aggravating Factors   doing too much looking up    Pain Relieving Factors  going to to pool and heat    Effect of Pain on Daily Activities  sometimes    Pain Score  1    Pain Location  Jaw    Pain Orientation  Right;Left    Pain Descriptors / Indicators  Tightness    Pain Type  Chronic pain    Pain Frequency  Constant    Aggravating Factors   sleeping                       [facility] Adult PT Treatment/Exercise - 10/05/17 0001      Neck Exercises: Supine   Other Supine Exercise  TMJ 5 X 5 seconds,  cues      Moist Heat Therapy   Number Minutes Moist Heat  5 Minutes  Moist Heat Location  Cervical patient requested short time      Manual Therapy   Manual therapy comments  soft tissue work,  neuromuscular trigger point release  upper back, neck musculature .  tissue rexaxed and pain reduced to 0/10 in neck  (Prone)       Trigger Point Dry Needling - 10/05/17 1241    Consent Given?  Yes    Education Handout Provided  No           PT Education - 10/05/17 1248    Education provided  Yes    Education Details  Dry needle,  massage and how they help    Person(s) Educated  Patient    Methods  Explanation    Comprehension  Verbalized understanding       PT Short Term Goals - 09/28/17 1730      PT SHORT TERM GOAL #1   Title  pt to be I with initial HEP     Baseline  no previous HEp     Time  3    Period  Weeks    Status  New    Target Date  10/19/17      PT SHORT TERM GOAL #2   Title  pt to verbalize/ demo proper posture to prevent and reduce neck and jaw pain     Baseline  decrease knowledge of proper posture    Time  3    Period  Weeks    Status  New    Target Date  10/19/17      PT SHORT TERM GOAL #3   Title  reduce masseter /  upper trap and surrounding muscle spasm to promote pain relief to  2/10 and promote cervical mobility     Baseline  signifcant spasm in masseter and bil upper traps pain at 3/10    Time  3    Period  Weeks    Status  New    Target Date  10/19/17        PT Long Term Goals - 09/28/17 1732      PT LONG TERM GOAL #1   Title  increase all cervical motions by >/= 10 degrees with </= 2/10 pain for functional mobility for safety with driving     Baseline  flexion 20, ext 30, R sidebend/ L sidebend 30, R rotation 55, L rotation 40     Time  6    Period  Weeks    Status  New    Target Date  11/09/17      PT LONG TERM GOAL #2   Title  pt to report decrease HA frequency to </= 1 weekly and migraines 1 every 2 weeks to demo improvement in condition    Baseline  HA daily and Migraines weekly    Time  6    Period  Weeks    Status  New    Target Date  11/09/17      PT LONG TERM GOAL #3   Title  pt to exhibit equal opening of the mandibal with no curvature or deflection noted to demonstrate improvement in R TMJ and reduced masseter tightness with </= 1/10 pain for QOL    Baseline  C curvature to the R with 3/10 pain noted    Time  6    Period  Weeks    Status  New    Target Date  11/09/17      PT LONG TERM GOAL #4  Title  pt to be I with all HEP given as of last visit to maintain and progress current level of function     Baseline  no previous HEp     Time  6    Period  Weeks    Status  New    Target Date  11/09/17            Plan - 10/05/17 1249    Clinical Impression Statement  No neck pain  at end of session.  Session focused on pain control.  She sould be ready to progress HEP next visit.     PT Next Visit Plan  review and update HEP, assess  DN for masseter, and upper traps/ sub-occipitals, posture training, thoracic mobility, modalities PRN    PT Home Exercise Plan  upper cervical rotation, chin tuck, upper trap, tongue resting position.     Consulted and Agree with Plan  of Care  Patient       Patient will benefit from skilled therapeutic intervention in order to improve the following deficits and impairments:     Visit Diagnosis: Cervicalgia  TMJ (temporomandibular joint disorder)  Abnormal posture  Other muscle spasm  Chronic tension-type headache, intractable     Problem List Patient Active Problem List   Diagnosis Date Noted  . Chronic low back pain 08/30/2017  . Chronic pain of both knees 08/30/2017  . Chronic migraine without aura, with intractable migraine, so stated, with status migrainosus 03/06/2017  . Intractable chronic migraine without aura and without status migrainosus 09/29/2016  . Occipital neuralgia 12/12/2015  . Thyroid nodule 07/19/2013  . Right thyroid nodule 06/18/2013  . Morbid obesity ([place]) 05/01/2013  . Asthma, chronic 02/01/2013  . Smoker 02/01/2013    HARRIS,KARENPTA 10/05/2017, 12:55 PM  [facility] [address] [place], [place], [address] Phone: [phone]   Fax:  [phone]  Name: MAYLINE DRAGON MRN: [id number] Date of Birth: Dec 13, 1975

## 2017-10-07 ENCOUNTER — Other Ambulatory Visit: Payer: Self-pay | Admitting: Neurology

## 2017-10-07 ENCOUNTER — Inpatient Hospital Stay: Admission: RE | Admit: 2017-10-07 | Payer: Medicaid Other | Source: Ambulatory Visit

## 2017-10-10 ENCOUNTER — Ambulatory Visit: Payer: Medicaid Other | Admitting: Physical Therapy

## 2017-10-18 ENCOUNTER — Ambulatory Visit: Payer: Medicaid Other | Admitting: Physical Therapy

## 2017-10-18 ENCOUNTER — Encounter: Payer: Self-pay | Admitting: Physical Therapy

## 2017-10-18 DIAGNOSIS — M62838 Other muscle spasm: Secondary | ICD-10-CM

## 2017-10-18 DIAGNOSIS — M26609 Unspecified temporomandibular joint disorder, unspecified side: Secondary | ICD-10-CM

## 2017-10-18 DIAGNOSIS — M542 Cervicalgia: Secondary | ICD-10-CM | POA: Diagnosis not present

## 2017-10-18 DIAGNOSIS — R293 Abnormal posture: Secondary | ICD-10-CM

## 2017-10-18 NOTE — Therapy (Signed)
[place], [place], [address] Phone: [phone]   Fax:  [phone]  Physical Therapy Treatment  Patient Details  Name: Cindy Hoffman MRN: [id number] Date of Birth: 01-Mar-1976 Referring Provider: Sarina Ill MD   Encounter Date: 10/18/2017  PT End of Session - 10/18/17 1553    Visit Number  3    Number of Visits  13    Authorization Type  MCD( REassessed/ submit at 4th visit)    PT Start Time  1550 pt arrived 5 min late    PT Stop Time  1631    PT Time Calculation (min)  41 min    Activity Tolerance  Patient tolerated treatment well    Behavior During Therapy  [facility] for tasks assessed/performed       Past Medical History:  Diagnosis Date  . Anxiety   . Asthma   . Chronic pain syndrome   . Complication of anesthesia    "STATES WAKES UP AND CANT BREATHE AND NEEDS BREATHING TREATMENT  IN PACU"  . Dysrhythmia    palpitations- "from thyroid"  . Fibromyalgia   . Fracture of right foot   . Gastric ulcer 2018  . GERD (gastroesophageal reflux disease)   . Migraine   . Myofacial muscle pain   . Obesity   . Occipital neuralgia 2016  . OSA (obstructive sleep apnea)    Has CPAP  . Sleep apnea     NO CPAP--could not tolerates mask  . Thyroid disease   . Viral respiratory infection 06/26/13   ? influenza    Past Surgical History:  Procedure Laterality Date  . ENDOMETRIAL ABLATION    . ENDOMETRIAL ABLATION  07/17/2017  . GALLBLADDER SURGERY  06/2016  . 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 NASAL SEPTUM SURGERY    . THYROID LOBECTOMY Right 07/19/2013   Procedure: THYROID LOBECTOMY;  Surgeon: Harl Bowie, MD;  Location: [facility];  Service: General;  Laterality: Right;  . TUBAL LIGATION    . WISDOM TOOTH EXTRACTION      There were no vitals filed for this visit.  Subjective Assessment - 10/18/17 1551    Subjective  "my low back and legs are having more muscle spasm and botheringme today. I've  noticed after my deep tissue massage and I felt very sore. I still get HA and Migraines and still have to take medicaiton continuously    Patient Stated Goals  to get relaxation and relief, relieve pain, and to get off all the medication.     Currently in Pain?  Yes    Pain Score  4     Pain Location  Neck    Pain Orientation  Right;Left    Pain Descriptors / Indicators  Aching;Headache;Throbbing;Sharp    Pain Type  Chronic pain    Aggravating Factors   doing to much    Pain Relieving Factors  going to the pool                        [facility] Adult PT Treatment/Exercise - 10/18/17 0001      Neck Exercises: Supine   Other Supine Exercise  TMJ 5 X 5 seconds,  cues      Moist Heat Therapy   Number Minutes Moist Heat  2 Minutes    Moist Heat Location  Cervical prone: pt opted to do it at home      Manual Therapy   Manual Therapy  Joint mobilization;Soft tissue mobilization;Taping    Manual therapy comments  skilled palpation and monitoring of pt throughout TPDN    Joint Mobilization  PA T1-T8 grade 3    Soft tissue mobilization  IASTM along bil upper trap/levator scapulae and     McConnell  R shoulder inhibition taping       Trigger Point Dry Needling - 10/18/17 1559    Consent Given?  Yes    Education Handout Provided  No    Muscles Treated Upper Body  Upper trapezius;Suboccipitals muscle group masseter and lateral pterygoid    Upper Trapezius Response  Palpable increased muscle length;Twitch reponse elicited bil    SubOccipitals Response  Twitch response elicited;Palpable increased muscle length Bil           PT Education - 10/18/17 1642    Education provided  Yes    Education Details  benefits of inhibition taping and length of wear    Person(s) Educated  Patient    Methods  Explanation;Verbal cues    Comprehension  Verbalized understanding;Verbal cues required       PT Short Term Goals - 09/28/17 1730      PT SHORT TERM GOAL #1   Title  pt to be I  with initial HEP     Baseline  no previous HEp     Time  3    Period  Weeks    Status  New    Target Date  10/19/17      PT SHORT TERM GOAL #2   Title  pt to verbalize/ demo proper posture to prevent and reduce neck and jaw pain     Baseline  decrease knowledge of proper posture    Time  3    Period  Weeks    Status  New    Target Date  10/19/17      PT SHORT TERM GOAL #3   Title  reduce masseter / upper trap and surrounding muscle spasm to promote pain relief to  2/10 and promote cervical mobility     Baseline  signifcant spasm in masseter and bil upper traps pain at 3/10    Time  3    Period  Weeks    Status  New    Target Date  10/19/17        PT Long Term Goals - 09/28/17 1732      PT LONG TERM GOAL #1   Title  increase all cervical motions by >/= 10 degrees with </= 2/10 pain for functional mobility for safety with driving     Baseline  flexion 20, ext 30, R sidebend/ L sidebend 30, R rotation 55, L rotation 40     Time  6    Period  Weeks    Status  New    Target Date  11/09/17      PT LONG TERM GOAL #2   Title  pt to report decrease HA frequency to </= 1 weekly and migraines 1 every 2 weeks to demo improvement in condition    Baseline  HA daily and Migraines weekly    Time  6    Period  Weeks    Status  New    Target Date  11/09/17      PT LONG TERM GOAL #3   Title  pt to exhibit equal opening of the mandibal with no curvature or deflection noted to demonstrate improvement in R TMJ and reduced masseter tightness with </= 1/10 pain for  QOL    Baseline  C curvature to the R with 3/10 pain noted    Time  6    Period  Weeks    Status  New    Target Date  11/09/17      PT LONG TERM GOAL #4   Title  pt to be I with all HEP given as of last visit to maintain and progress current level of function     Baseline  no previous HEp     Time  6    Period  Weeks    Status  New    Target Date  11/09/17            Plan - 10/18/17 1643    Clinical Impression  Statement  pt reported 4/10 pain today with a migraine. focused todays session on manual techniques to reduce pain and HA. Continued TPDN for the masseter, lateral pterygoid, upper trap followed with IASTM techniques and throacic mobs. trialed inhibition taping to calm down upper trap activation which pt reported relief of pain. end of session she reported no HA.     PT Next Visit Plan  Re-assess for MCD update HEP, assess  DN for masseter, and upper traps/ sub-occipitals, posture training, thoracic mobility, modalities PRN    PT Home Exercise Plan  upper cervical rotation, chin tuck, upper trap, tongue resting position.     Consulted and Agree with Plan of Care  Patient       Patient will benefit from skilled therapeutic intervention in order to improve the following deficits and impairments:  Decreased range of motion, Increased muscle spasms, Impaired flexibility, Improper body mechanics, Postural dysfunction, Pain, Decreased endurance  Visit Diagnosis: Cervicalgia  TMJ (temporomandibular joint disorder)  Abnormal posture  Other muscle spasm     Problem List Patient Active Problem List   Diagnosis Date Noted  . Chronic low back pain 08/30/2017  . Chronic pain of both knees 08/30/2017  . Chronic migraine without aura, with intractable migraine, so stated, with status migrainosus 03/06/2017  . Intractable chronic migraine without aura and without status migrainosus 09/29/2016  . Occipital neuralgia 12/12/2015  . Thyroid nodule 07/19/2013  . Right thyroid nodule 06/18/2013  . Morbid obesity ([place]) 05/01/2013  . Asthma, chronic 02/01/2013  . Smoker 02/01/2013   Starr Lake PT, DPT, LAT, ATC  10/18/17  4:47 PM      [facility] [address] [place], [place], [address] Phone: [phone]   Fax:  [phone]  Name: Cindy Hoffman MRN: [id number] Date of Birth: August 08, 1975

## 2017-10-24 ENCOUNTER — Ambulatory Visit: Payer: Medicaid Other | Admitting: Physical Therapy

## 2017-10-24 DIAGNOSIS — M26609 Unspecified temporomandibular joint disorder, unspecified side: Secondary | ICD-10-CM

## 2017-10-24 DIAGNOSIS — M62838 Other muscle spasm: Secondary | ICD-10-CM

## 2017-10-24 DIAGNOSIS — G44221 Chronic tension-type headache, intractable: Secondary | ICD-10-CM

## 2017-10-24 DIAGNOSIS — M542 Cervicalgia: Secondary | ICD-10-CM

## 2017-10-24 DIAGNOSIS — R293 Abnormal posture: Secondary | ICD-10-CM

## 2017-10-24 NOTE — Therapy (Addendum)
[place], [place], [address] Phone: [phone]   Fax:  [phone]  Physical Therapy Treatment/Medicaid Resubmission  Patient Details  Name: Cindy Hoffman MRN: [id number] Date of Birth: 27-Sep-1975 Referring Provider: Sarina Ill MD   Encounter Date: 10/24/2017  PT End of Session - 10/24/17 1608    Visit Number  4    Number of Visits  13    Date for PT Re-Evaluation  11/23/17    Authorization Type  MCD( REassessed/ submit at 12th visit)    PT Start Time  1545    PT Stop Time  1630    PT Time Calculation (min)  45 min    Activity Tolerance  Patient limited by pain    Behavior During Therapy  [facility] for tasks assessed/performed       Past Medical History:  Diagnosis Date  . Anxiety   . Asthma   . Chronic pain syndrome   . Complication of anesthesia    "STATES WAKES UP AND CANT BREATHE AND NEEDS BREATHING TREATMENT  IN PACU"  . Dysrhythmia    palpitations- "from thyroid"  . Fibromyalgia   . Fracture of right foot   . Gastric ulcer 2018  . GERD (gastroesophageal reflux disease)   . Migraine   . Myofacial muscle pain   . Obesity   . Occipital neuralgia 2016  . OSA (obstructive sleep apnea)    Has CPAP  . Sleep apnea     NO CPAP--could not tolerates mask  . Thyroid disease   . Viral respiratory infection 06/26/13   ? influenza    Past Surgical History:  Procedure Laterality Date  . ENDOMETRIAL ABLATION    . ENDOMETRIAL ABLATION  07/17/2017  . GALLBLADDER SURGERY  06/2016  . 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 NASAL SEPTUM SURGERY    . THYROID LOBECTOMY Right 07/19/2013   Procedure: THYROID LOBECTOMY;  Surgeon: Harl Bowie, MD;  Location: [facility];  Service: General;  Laterality: Right;  . TUBAL LIGATION    . WISDOM TOOTH EXTRACTION      There were no vitals filed for this visit.  Subjective Assessment - 10/24/17 1544    Subjective  "I have a migraine currently. I think I spent too  much time outside for my daughter's birthday party. I have been doing my exercises."    Currently in Pain?  Yes    Pain Score  8     Pain Location  Head    Pain Orientation  Right    Pain Descriptors / Indicators  Headache;Dull;Pounding    Pain Type  Acute pain    Pain Radiating Towards  behind eye    Pain Frequency  Intermittent    Pain Relieving Factors  pain medication         [facility] PT Assessment - 10/24/17 0001      AROM   Overall AROM Comments  protrustion/retrusion 5 mm    Cervical Flexion  32    Cervical Extension  30    Cervical - Right Side Bend  34 pain at end range    Cervical - Left Side Bend  32 pain at end range    Cervical - Right Rotation  35    Cervical - Left Rotation  25    Jaw-Incisal Opening   26 mm 34 following treatment     Jaw-Left Lateral Excurison  10 mm    Jaw-Right Lateral Excursion  10 mm  [facility] Adult PT Treatment/Exercise - 10/24/17 0001      Manual Therapy   Manual therapy comments  skilled palpation and monitoring of pt throughout TPDN    Joint Mobilization  PA C3-T4 grade 2-3, R 1st rib mob grade 3       Trigger Point Dry Needling - 10/24/17 1610    Consent Given?  Yes    Education Handout Provided  No    Muscles Treated Upper Body  Sternocleidomastoid;Upper trapezius;Suboccipitals muscle group masseter, scalenes    Sternocleidomastoid Response  Twitch response elicited    Upper Trapezius Response  Twitch reponse elicited    SubOccipitals Response  Twitch response elicited           PT Education - 10/24/17 1631    Education provided  Yes    Education Details  dry needling techniques utilized, muscle referral patterns, posture, functional progression, HEP review, updated POC    Person(s) Educated  Patient    Methods  Explanation    Comprehension  Verbalized understanding       PT Short Term Goals - 10/24/17 1558      PT SHORT TERM GOAL #1   Title  pt to be I with initial HEP     Baseline  Pt  performing exercises at home independently     Time  3    Period  Weeks    Status  Achieved      PT SHORT TERM GOAL #2   Title  pt to verbalize/ demo proper posture to prevent and reduce neck and jaw pain     Baseline  pt can verbalize proper posture    Time  3    Period  Weeks    Status  Achieved      PT SHORT TERM GOAL #3   Title  reduce masseter / upper trap and surrounding muscle spasm to promote pain relief to 2/10 and promote cervical mobility     Baseline  Decreased spasm and pt reports she does have less than 2/10 pain    Time  3    Period  Weeks    Status  Achieved        PT Long Term Goals - 10/24/17 1600      PT LONG TERM GOAL #1   Title  increase all cervical motions by >/= 10 degrees with </= 2/10 pain for functional mobility for safety with driving     Baseline  flexion 32, ext 30, R SB 34, L SB 32, R rotation 35, L rotation 25    Time  6    Period  Weeks    Status  On-going    Target Date  11/09/17      PT LONG TERM GOAL #2   Title  pt to report decrease HA frequency to </= 1 weekly and migraines 1 every 2 weeks to demo improvement in condition    Baseline  Pt currently experiencing migraine     Time  6    Period  Weeks    Status  On-going    Target Date  11/09/17      PT LONG TERM GOAL #3   Title  pt to exhibit equal opening of the mandibal with no curvature or deflection noted to demonstrate improvement in R TMJ and reduced masseter tightness with </= 1/10 pain for QOL    Baseline  No curvature or deflection noted; however, pt could not reach end range opening today. Pt's pain 8/10 today  Time  6    Period  Weeks    Status  On-going      PT LONG TERM GOAL #4   Title  pt to be I with all HEP given as of last visit to maintain and progress current level of function     Baseline  Pt is currently indepedent with HEP    Time  6    Period  Weeks    Status  On-going    Target Date  11/09/17            Plan - 10/24/17 1636    Clinical  Impression Statement  Pt with 8/10 pain today and reporting she currently experiencing a migraine. Pt reassessed today and demonstrates some increased cervical ROM and decreased TMJ depression, which increased following dry needling of the masseter.  Pt reports decreased pain following manual therapy including dry needling and joint mobilization of 1st rib and cervical/thoracic spine. Pt would benefit from OPPT services at address pain, ROM, posture, and proper body mechanics. Pt has achieved all short term goals and is progressing towards long term goals. She is very motivated to continue with physical therapy.     Rehab Potential  Good    PT Frequency  2x / week    PT Duration  6 weeks    PT Treatment/Interventions  Cryotherapy;Electrical Stimulation;Moist Heat;Ultrasound;Patient/family education;Therapeutic exercise;Manual techniques;ADLs/Self Care Home Management;Iontophoresis 4mg /ml Dexamethasone;Traction;Therapeutic activities;Taping;Passive range of motion;Dry needling    PT Next Visit Plan  update HEP, DN for masseter, and upper traps/ sub-occipitals, posture training, thoracic mobility, modalities PRN, scapular stabilization exercises    PT Home Exercise Plan  upper cervical rotation, chin tuck, upper trap, tongue resting position.     Consulted and Agree with Plan of Care  Patient       Patient will benefit from skilled therapeutic intervention in order to improve the following deficits and impairments:  Decreased range of motion, Increased muscle spasms, Impaired flexibility, Improper body mechanics, Postural dysfunction, Pain, Decreased endurance  Visit Diagnosis: Cervicalgia  TMJ (temporomandibular joint disorder)  Abnormal posture  Other muscle spasm  Chronic tension-type headache, intractable     Problem List Patient Active Problem List   Diagnosis Date Noted  . Chronic low back pain 08/30/2017  . Chronic pain of both knees 08/30/2017  . Chronic migraine without aura,  with intractable migraine, so stated, with status migrainosus 03/06/2017  . Intractable chronic migraine without aura and without status migrainosus 09/29/2016  . Occipital neuralgia 12/12/2015  . Thyroid nodule 07/19/2013  . Right thyroid nodule 06/18/2013  . Morbid obesity ([place]) 05/01/2013  . Asthma, chronic 02/01/2013  . Smoker 02/01/2013   Worthy Flank, SPT 10/24/17 5:15 PM   [facility] [address] [place], [place], [address] Phone: [phone]   Fax:  [phone]  Name: Cindy Hoffman MRN: [id number] Date of Birth: Sep 09, 1975

## 2017-11-09 ENCOUNTER — Encounter: Payer: Self-pay | Admitting: Physical Therapy

## 2017-11-09 ENCOUNTER — Ambulatory Visit: Payer: Medicaid Other | Attending: Neurology | Admitting: Physical Therapy

## 2017-11-09 DIAGNOSIS — G44221 Chronic tension-type headache, intractable: Secondary | ICD-10-CM | POA: Insufficient documentation

## 2017-11-09 DIAGNOSIS — R293 Abnormal posture: Secondary | ICD-10-CM | POA: Insufficient documentation

## 2017-11-09 DIAGNOSIS — M542 Cervicalgia: Secondary | ICD-10-CM | POA: Insufficient documentation

## 2017-11-09 DIAGNOSIS — M62838 Other muscle spasm: Secondary | ICD-10-CM | POA: Diagnosis present

## 2017-11-09 DIAGNOSIS — M26609 Unspecified temporomandibular joint disorder, unspecified side: Secondary | ICD-10-CM | POA: Diagnosis present

## 2017-11-09 NOTE — Patient Instructions (Signed)
Sleeping on Back  Place pillow under knees. A pillow with cervical support and a roll around waist are also helpful. Copyright  VHI. All rights reserved.  Sleeping on Side Place pillow between knees. Use cervical support under neck and a roll around waist as needed. Copyright  VHI. All rights reserved.   Sleeping on Stomach   If this is the only desirable sleeping position, place pillow under lower legs, and under stomach or chest as needed.  Posture - Sitting   Sit upright, head facing forward. Try using a roll to support lower back. Keep shoulders relaxed, and avoid rounded back. Keep hips level with knees. Avoid crossing legs for long periods. Stand to Sit / Sit to Stand   To sit: Bend knees to lower self onto front edge of chair, then scoot back on seat. To stand: Reverse sequence by placing one foot forward, and scoot to front of seat. Use rocking motion to stand up.   Work Height and Reach  Ideal work height is no more than 2 to 4 inches below elbow level when standing, and at elbow level when sitting. Reaching should be limited to arm's length, with elbows slightly bent.  Bending  Bend at hips and knees, not back. Keep feet shoulder-width apart.    Posture - Standing   Good posture is important. Avoid slouching and forward head thrust. Maintain curve in low back and align ears over shoul- ders, hips over ankles.  Alternating Positions   Alternate tasks and change positions frequently to reduce fatigue and muscle tension. Take rest breaks. Computer Work   Position work to face forward. Use proper work and seat height. Keep shoulders back and down, wrists straight, and elbows at right angles. Use chair that provides full back support. Add footrest and lumbar roll as needed.  Getting Into / Out of Car  Lower self onto seat, scoot back, then bring in one leg at a time. Reverse sequence to get out.  Dressing  Lie on back to pull socks or slacks over feet, or sit  and bend leg while keeping back straight.    Housework - Sink  Place one foot on ledge of cabinet under sink when standing at sink for prolonged periods.   Pushing / Pulling  Pushing is preferable to pulling. Keep back in proper alignment, and use leg muscles to do the work.  Deep Squat   Squat and lift with both arms held against upper trunk. Tighten stomach muscles without holding breath. Use smooth movements to avoid jerking.  Avoid Twisting   Avoid twisting or bending back. Pivot around using foot movements, and bend at knees if needed when reaching for articles.  Carrying Luggage   Distribute weight evenly on both sides. Use a cart whenever possible. Do not twist trunk. Move body as a unit.   Lifting Principles .Maintain proper posture and head alignment. .Slide object as close as possible before lifting. .Move obstacles out of the way. .Test before lifting; ask for help if too heavy. .Tighten stomach muscles without holding breath. .Use smooth movements; do not jerk. .Use legs to do the work, and pivot with feet. .Distribute the work load symmetrically and close to the center of trunk. .Push instead of pull whenever possible.   Ask For Help   Ask for help and delegate to others when possible. Coordinate your movements when lifting together, and maintain the low back curve.  Log Roll   Lying on back, bend left knee and place left   arm across chest. Roll all in one movement to the right. Reverse to roll to the left. Always move as one unit. Housework - Sweeping  Use long-handled equipment to avoid stooping.   Housework - Wiping  Position yourself as close as possible to reach work surface. Avoid straining your back.  Laundry - Unloading Wash   To unload small items at bottom of washer, lift leg opposite to arm being used to reach.  [place] close to area to be raked. Use arm movements to do the work. Keep back straight and avoid  twisting.     Cart  When reaching into cart with one arm, lift opposite leg to keep back straight.   Getting Into / Out of Bed  Lower self to lie down on one side by raising legs and lowering head at the same time. Use arms to assist moving without twisting. Bend both knees to roll onto back if desired. To sit up, start from lying on side, and use same move-ments in reverse. Housework - Vacuuming  Hold the vacuum with arm held at side. Step back and forth to move it, keeping head up. Avoid twisting.   Laundry - IT consultant so that bending and twisting can be avoided.   Laundry - Unloading Dryer  Squat down to reach into clothes dryer or use a reacher.  Gardening - Weeding / Probation officer or Kneel. Knee pads may be helpful.                    Over Head Pull: Narrow Grip       On back, knees bent, feet flat, band across thighs, elbows straight but relaxed. Pull hands apart (start). Keeping elbows straight, bring arms up and over head, hands toward floor. Keep pull steady on band. Hold momentarily. Return slowly, keeping pull steady, back to start. Repeat _5-10__ times. Band color ___Yellow___   Side Pull: Double Arm   On back, knees bent, feet flat. Arms perpendicular to body, shoulder level, elbows straight but relaxed. Pull arms out to sides, elbows straight. Resistance band comes across collarbones, hands toward floor. Hold momentarily. Slowly return to starting position. Repeat 5-10___ times. Band color __Yellow___   Sash   On back, knees bent, feet flat, left hand on left hip, right hand above left. Pull right arm DIAGONALLY (hip to shoulder) across chest. Bring right arm along head toward floor. Hold momentarily. Slowly return to starting position. Repeat _5-10__ times. Do with left arm. Band color _Yellow_____   Shoulder Rotation: Double Arm   On back, knees bent, feet flat, elbows tucked at sides, bent 90, hands palms  up. Pull hands apart and down toward floor, keeping elbows near sides. Hold momentarily. Slowly return to starting position. Repeat _5-10__ times. Band color __Yellow____

## 2017-11-09 NOTE — Therapy (Addendum)
[place], [place], [address] Phone: [phone]   Fax:  [phone]  Physical Therapy Treatment / Discharge Summary  Patient Details  Name: Cindy Hoffman MRN: [id number] Date of Birth: July 04, 1975 Referring Provider: Sarina Ill MD   Encounter Date: 11/09/2017  PT End of Session - 11/09/17 1312    Visit Number  5    Number of Visits  13    Date for PT Re-Evaluation  11/23/17    PT Start Time  1109    PT Stop Time  1147    PT Time Calculation (min)  38 min    Activity Tolerance  Patient tolerated treatment well    Behavior During Therapy  [facility] for tasks assessed/performed       Past Medical History:  Diagnosis Date  . Anxiety   . Asthma   . Chronic pain syndrome   . Complication of anesthesia    "STATES WAKES UP AND CANT BREATHE AND NEEDS BREATHING TREATMENT  IN PACU"  . Dysrhythmia    palpitations- "from thyroid"  . Fibromyalgia   . Fracture of right foot   . Gastric ulcer 2018  . GERD (gastroesophageal reflux disease)   . Migraine   . Myofacial muscle pain   . Obesity   . Occipital neuralgia 2016  . OSA (obstructive sleep apnea)    Has CPAP  . Sleep apnea     NO CPAP--could not tolerates mask  . Thyroid disease   . Viral respiratory infection 06/26/13   ? influenza    Past Surgical History:  Procedure Laterality Date  . ENDOMETRIAL ABLATION    . ENDOMETRIAL ABLATION  07/17/2017  . GALLBLADDER SURGERY  06/2016  . 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 NASAL SEPTUM SURGERY    . THYROID LOBECTOMY Right 07/19/2013   Procedure: THYROID LOBECTOMY;  Surgeon: Harl Bowie, MD;  Location: [facility];  Service: General;  Laterality: Right;  . TUBAL LIGATION    . WISDOM TOOTH EXTRACTION      There were no vitals filed for this visit.  Subjective Assessment - 11/09/17 1112    Subjective  6/10  pain pain.  Doing the exercise.  has a HA    Currently in Pain?  Yes    Pain Score  5     Pain  Location  Head    Pain Orientation  Anterior    Pain Descriptors / Indicators  Constant headach,  tinnitis    Pain Type  Acute pain    Pain Frequency  Intermittent    Multiple Pain Sites  -- back pain,  long standing 6/10  mid lower,  has arthritis knee pain both knees longf standing  2-3/10,      Pain Score  2    Pain Location  Jaw    Pain Orientation  Right;Left    Pain Descriptors / Indicators  Aching    Pain Type  Chronic pain    Aggravating Factors   eating                       [facility] Adult PT Treatment/Exercise - 11/09/17 0001      Self-Care   Self-Care  ADL's      Shoulder Exercises: Supine   Horizontal ABduction  10 reps    Theraband Level (Shoulder Horizontal ABduction)  Level 1 (Yellow)    External Rotation  10 reps    Theraband Level (Shoulder External Rotation)  Level 1 (Yellow)    Flexion  10 reps    Theraband Level (Shoulder Flexion)  Level 1 (Yellow)    Other Supine Exercises  sash both 10 x  yellow band  cued             PT Education - 11/09/17 1311    Education provided  Yes    Education Details  HEP,  body mechanics,  how tape works    [employer]) Educated  Patient    Methods  Explanation;Demonstration;Tactile cues;Verbal cues;Handout    Comprehension  Verbalized understanding;Returned demonstration       PT Short Term Goals - 10/24/17 1558      PT SHORT TERM GOAL #1   Title  pt to be I with initial HEP     Baseline  Pt performing exercises at home independently     Time  3    Period  Weeks    Status  Achieved      PT SHORT TERM GOAL #2   Title  pt to verbalize/ demo proper posture to prevent and reduce neck and jaw pain     Baseline  pt can verbalize proper posture    Time  3    Period  Weeks    Status  Achieved      PT SHORT TERM GOAL #3   Title  reduce masseter / upper trap and surrounding muscle spasm to promote pain relief to 2/10 and promote cervical mobility     Baseline  Decreased spasm and pt reports she does have  less than 2/10 pain    Time  3    Period  Weeks    Status  Achieved        PT Long Term Goals - 11/09/17 1316      PT LONG TERM GOAL #2   Title  pt to report decrease HA frequency to </= 1 weekly and migraines 1 every 2 weeks to demo improvement in condition    Baseline  has a HA today    Time  6    Period  Weeks    Status  On-going      PT LONG TERM GOAL #3   Title  pt to exhibit equal opening of the mandibal with no curvature or deflection noted to demonstrate improvement in R TMJ and reduced masseter tightness with </= 1/10 pain for QOL    Time  6    Period  Weeks    Status  Unable to assess      PT LONG TERM GOAL #4   Title  pt to be I with all HEP given as of last visit to maintain and progress current level of function     Baseline  Pt is currently indepedent with all HEP except for new exercises added today  ( modified independent)    Time  6    Period  Weeks    Status  On-going            Plan - 11/09/17 1313    Clinical Impression Statement  Patient continued with same pain she had as she arrived today.  She tolerated New HEP with mild increases of pain which eased with rest.  trial of tape to neck,  Paraspinals and upper traps.  No new objective findings.    PT Next Visit Plan  Review supine scapular stabilization, DN for masseter, and upper traps/ sub-occipitals, posture training, thoracic mobility, modalities PRN, scapular stabilization exercises    PT  Home Exercise Plan  upper cervical rotation, chin tuck, upper trap, tongue resting position. supine scapular stabilization exercise,  yellow band.    Consulted and Agree with Plan of Care  Patient       Patient will benefit from skilled therapeutic intervention in order to improve the following deficits and impairments:     Visit Diagnosis: Cervicalgia  TMJ (temporomandibular joint disorder)  Abnormal posture  Other muscle spasm  Chronic tension-type headache, intractable     Problem  List Patient Active Problem List   Diagnosis Date Noted  . Chronic low back pain 08/30/2017  . Chronic pain of both knees 08/30/2017  . Chronic migraine without aura, with intractable migraine, so stated, with status migrainosus 03/06/2017  . Intractable chronic migraine without aura and without status migrainosus 09/29/2016  . Occipital neuralgia 12/12/2015  . Thyroid nodule 07/19/2013  . Right thyroid nodule 06/18/2013  . Morbid obesity ([place]) 05/01/2013  . Asthma, chronic 02/01/2013  . Smoker 02/01/2013    Starr Urias PTA 11/09/2017, 1:19 PM  [facility] [address] [place], [place], [address] Phone: [phone]   Fax:  [phone]  Name: Cindy Hoffman MRN: [id number] Date of Birth: May 03, 1976      PHYSICAL THERAPY DISCHARGE SUMMARY  Visits from Start of Care: 5  Current functional level related to goals / functional outcomes: See goals   Remaining deficits: unknown   Education / Equipment: Posture,   Plan: Patient agrees to discharge.  Patient goals were not met. Patient is being discharged due to not returning since the last visit.  ?????         Kristoffer Leamon PT, DPT, LAT, ATC  02/20/18  9:32 AM

## 2017-11-14 ENCOUNTER — Ambulatory Visit: Payer: Medicaid Other | Admitting: Physical Therapy

## 2017-11-15 ENCOUNTER — Ambulatory Visit
Admission: RE | Admit: 2017-11-15 | Discharge: 2017-11-15 | Disposition: A | Discharge status: Home / Self Care | Payer: Medicaid Other | Source: Ambulatory Visit | Attending: Orthopaedic Surgery | Admitting: Orthopaedic Surgery

## 2017-11-15 DIAGNOSIS — M549 Dorsalgia, unspecified: Secondary | ICD-10-CM

## 2017-11-16 ENCOUNTER — Ambulatory Visit: Payer: Medicaid Other | Admitting: Physical Therapy

## 2017-11-23 ENCOUNTER — Ambulatory Visit (INDEPENDENT_AMBULATORY_CARE_PROVIDER_SITE_OTHER): Payer: Medicaid Other | Admitting: Orthopaedic Surgery

## 2017-11-23 ENCOUNTER — Encounter (INDEPENDENT_AMBULATORY_CARE_PROVIDER_SITE_OTHER): Payer: Self-pay | Admitting: Orthopaedic Surgery

## 2017-11-23 DIAGNOSIS — M549 Dorsalgia, unspecified: Secondary | ICD-10-CM | POA: Diagnosis not present

## 2017-11-23 NOTE — Progress Notes (Signed)
Office Visit Note   Patient: Cindy Hoffman           Date of Birth: 15-Sep-1975           MRN: [id number] Visit Date: 11/23/2017              Requested by: Berkley Harvey, NP [place], [place] [address] PCP: Berkley Harvey, NP   Assessment & Plan: Visit Diagnoses:  1. 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  Patient has failed conservative treatment.  At this point we will refer her to Dr. Kathyrn Sheriff for surgical evaluation.  MRI reviewed with the patient today.  Patient in agreement.  Follow-Up Instructions: Return if symptoms worsen or fail to improve.   Orders:  Orders Placed This Encounter  Procedures  . Ambulatory referral to Neurosurgery   No orders of the defined types were placed in this encounter.     Procedures: No procedures performed   Clinical Data: No additional findings.   Subjective: Chief Complaint  Patient presents with  . Middle Back - Pain, Follow-up    Patient is here to review her MRI.  She continues to have chronic mid back pain.   Review of Systems  Constitutional: Negative.   HENT: Negative.   Eyes: Negative.   Respiratory: Negative.   Cardiovascular: Negative.   Endocrine: Negative.   Musculoskeletal: Negative.   Neurological: Negative.   Hematological: Negative.   Psychiatric/Behavioral: Negative.   All other systems reviewed and are negative.    Objective: Vital Signs: There were no vitals taken for this visit.  Physical Exam  Constitutional: She is oriented to person, place, and time. She appears well-developed and well-nourished.  Pulmonary/Chest: Effort normal.  Neurological: She is alert and oriented to person, place, and time.  Skin: Skin is warm. Capillary refill takes less than 2 seconds.  Psychiatric: She has a normal mood and affect. Her behavior is normal. Judgment and thought content normal.  Nursing note and vitals  reviewed.   Ortho Exam Thoracic spine is tender to palpation.  She has no spasticity or upper motor neuron signs. Specialty Comments:  No specialty comments available.  Imaging: No results found.   PMFS History: Patient Active Problem List   Diagnosis Date Noted  . Chronic low back pain 08/30/2017  . Chronic pain of both knees 08/30/2017  . Chronic migraine without aura, with intractable migraine, so stated, with status migrainosus 03/06/2017  . Intractable chronic migraine without aura and without status migrainosus 09/29/2016  . Occipital neuralgia 12/12/2015  . Thyroid nodule 07/19/2013  . Right thyroid nodule 06/18/2013  . Morbid obesity ([place]) 05/01/2013  . Asthma, chronic 02/01/2013  . Smoker 02/01/2013   Past Medical History:  Diagnosis Date  . Anxiety   . Asthma   . Chronic pain syndrome   . Complication of anesthesia    "STATES WAKES UP AND CANT BREATHE AND NEEDS BREATHING TREATMENT  IN PACU"  . Dysrhythmia    palpitations- "from thyroid"  . Fibromyalgia   . Fracture of right foot   . Gastric ulcer 2018  . GERD (gastroesophageal reflux disease)   . Migraine   . Myofacial muscle pain   . Obesity   . Occipital neuralgia 2016  . OSA (obstructive sleep apnea)    Has CPAP  . Sleep apnea     NO CPAP--could not tolerates mask  . Thyroid disease   .  Viral respiratory infection 06/26/13   ? influenza    Family History  Problem Relation Age of Onset  . Hypertension Mother   . Hypertension Father   . Prostate cancer Father   . Diabetes Sister   . Cancer Paternal Grandmother        breast    Past Surgical History:  Procedure Laterality Date  . ENDOMETRIAL ABLATION    . ENDOMETRIAL ABLATION  07/17/2017  . GALLBLADDER SURGERY  06/2016  . 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 NASAL SEPTUM SURGERY    . THYROID LOBECTOMY Right 07/19/2013   Procedure: THYROID LOBECTOMY;  Surgeon: Harl Bowie, MD;  Location: [facility];  Service: General;  Laterality: Right;  .  TUBAL LIGATION    . WISDOM TOOTH EXTRACTION     Social History   Occupational History  . Occupation: unemployed  Tobacco Use  . Smoking status: Former Smoker    Packs/day: 0.00    Years: 0.00    Pack years: 0.00    Types: Cigarettes    Last attempt to quit: 11/2015    Years since quitting: 2.0  . Smokeless tobacco: Never Used  Substance and Sexual Activity  . Alcohol use: No  . Drug use: No  . Sexual activity: Not on file

## 2018-02-20 ENCOUNTER — Telehealth: Payer: Self-pay | Admitting: Neurology

## 2018-02-20 ENCOUNTER — Other Ambulatory Visit: Payer: Self-pay | Admitting: Neurology

## 2018-02-20 DIAGNOSIS — M26623 Arthralgia of bilateral temporomandibular joint: Secondary | ICD-10-CM

## 2018-02-20 DIAGNOSIS — S161XXA Strain of muscle, fascia and tendon at neck level, initial encounter: Secondary | ICD-10-CM

## 2018-02-20 NOTE — Telephone Encounter (Signed)
Yes fine thanks

## 2018-02-20 NOTE — Telephone Encounter (Signed)
Patient calling to get a new referral  for PT. She says her order expired because she had a family emergency. She states would like this message sent to Dr. Jaynee Eagles.

## 2018-02-20 NOTE — Telephone Encounter (Signed)
Called pt and informed her that Dr. Jaynee Eagles wrote the referral again for PT. She verbalized appreciation.

## 2018-03-03 ENCOUNTER — Ambulatory Visit: Payer: Medicaid Other | Attending: Neurology | Admitting: Physical Therapy

## 2018-03-14 ENCOUNTER — Ambulatory Visit: Payer: Medicaid Other | Admitting: Adult Health

## 2018-03-14 ENCOUNTER — Telehealth: Payer: Self-pay | Admitting: *Deleted

## 2018-03-14 NOTE — Telephone Encounter (Signed)
Patient was no show for follow up with NP today.  

## 2018-03-15 ENCOUNTER — Encounter: Payer: Self-pay | Admitting: Adult Health

## 2018-04-11 ENCOUNTER — Ambulatory Visit: Payer: Medicaid Other | Admitting: Adult Health

## 2018-04-11 ENCOUNTER — Encounter: Payer: Self-pay | Admitting: Adult Health

## 2018-04-11 VITALS — BP 113/72 | HR 75 | Ht 64.5 in | Wt 228.0 lb

## 2018-04-11 DIAGNOSIS — M549 Dorsalgia, unspecified: Secondary | ICD-10-CM

## 2018-04-11 DIAGNOSIS — G8929 Other chronic pain: Secondary | ICD-10-CM

## 2018-04-11 DIAGNOSIS — G43009 Migraine without aura, not intractable, without status migrainosus: Secondary | ICD-10-CM

## 2018-04-11 MED ORDER — TOPIRAMATE 200 MG PO TABS: 200.0000 mg | ORAL_TABLET | Freq: Every day | ORAL | 11 refills | Status: DC

## 2018-04-11 MED ORDER — AMITRIPTYLINE HCL 25 MG PO TABS: 100.0000 mg | ORAL_TABLET | Freq: Every day | ORAL | 11 refills | Status: DC

## 2018-04-11 NOTE — Progress Notes (Signed)
PATIENT: Cindy Hoffman DOB: 1975-05-22  REASON FOR VISIT: follow up HISTORY FROM: patient  HISTORY OF PRESENT ILLNESS: Today 04/11/18:  Cindy Hoffman is a 42 year old female with a history of chronic migraine headaches.  She returns today for follow-up.  She remains on amitriptyline, Topamax and Emgality.  She reports that her headache frequency is well controlled.  She has approximately 1 headache a week.  It typically occurs behind the eyes or in the right occipital region.  She reports photophobia and phonophobia as well as nausea but denies vomiting.  She states that she typically can take either a muscle relaxer, Tylenol, oxycodone or just use ice and her headache will resolve.  She states that she has not received Botox since May.  She reports that she is also having chronic back pain.  States that she fell before Thanksgiving has had some falls since then.  Reports that if she sits for long period of time her legs will go numb.  She has been seeing Dr.Xu with orthopedics for back pain.  She was also referred to neurosurgery.  She returns today for evaluation.  HISTORY 09/08/17 Cindy Hoffman is a 42 year old female with a history of chronic migraine headache.  She returns today for follow-up.  She is currently on Botox, Emgality.  Amitriptyline, tizanidine and Topamax.  She reports that with the initiation of Botox and Emgality her headaches have improved.  She states that she is having approximately 1 headache a week.  Reports her headaches typically occur in the temporal regions bilaterally.  They also sometimes will occur in the occipital region.  She does have photophobia, phonophobia as well as nausea.  She does report that with Topamax she has noticed numbness and tingling in the fingertips and toes.  She does have chronic back pain for which she sees an orthopedist.  She returns today for evaluation.  HISTORY (Copied from Dr. Cathren Laine notes) Interval history 05/05/2017:Botox denied despite  prior auths and appeals. Chronic intractable migraines, multiple other neurologic symptoms, in pain management, has been to multiple neurologists and failed multiple classes of medications. She is now having side effects to Ajovy, she continues to have severe migraines. She tried Topamax in the past but was only on 25mg  she thinks and stopped taking it. She has chronic back pain and is on opiate medication chronically.   -Medications triedand failed or side effects: Trileptal. Topiramate. Neurontin. Amitriptyline. Trileptal. Zanaflex. Effexor, Robaxin. She cannot take beta blockers or fluoxetine due to asthma.Had side effects to Ajovy (insomnia, muscle aches)  Interval history 03/03/2017: Botox denied despite prior auths and appeals. Patient with chronic intractable migraines, without medication overuse, failed multiple classes of meds (see below), pounding, throbbing daily migraines with light and sound sensitivity, nausea, worse with movement, unilateral, daily for years, can last 12 hours or longer. Very frustrating that we cannot provide botox. Discussed options at length, will try Ajovy and a sample was provided to patient.  Interval history 09/29/2016:Patient has a past medical history of chronic headache and neck pain, morbid obesity, asthma, hypothyroidism, anxiety, status post recent bariatric surgery, chronic low back pain. She describes having migraines for about 20 years. She is working with Dr. Rochele Pages in pain management for chronic back pain. MRI of the brain and cervical spine did not show any etiology for her symptoms. She's had multiple nerve blocks without results. She was sent to [facility] but did not want to pursue the stimulator. Based on her diffuse pain multiple  laboratory studies and rheumatologic markers were ordered. [facility] also suggested fibromyalgia. Given her chronic history of migraine headaches could also try other medications such as Depakote, verapamil,  diltiazem or Botox. Her amitriptyline was increased at [facility] up to 50 mg.  Patient has chronic migraines. No medication overuse. She's failed multiple medications as listed below. She continues to have daily headaches with at least 15 migraines monthly. This frequency ongoing for over a year and longer. Patient's headaches are right-sided, throbbing pounding, she has light sensitivity and sound sensitivity, she has vomited in the past. No aura. Migraines can last up to 24 hours or some days longer. On average it lasts at least 12 hours. Can be severe pain. She is very tired of suffering an she's tried everything. She has recently lost 70 pounds in hopes that this will help with her migraines but has not. Today I recommended Botox therapy. I do feel that patient would be a good candidate.  Patient reports memory changes, she forgets things midsentence, has to write things down. I reassured patient that she does not have a degenerative disease such as Alzheimer's, her memory complaints are likely due to chronic pain, normal aging and polypharmacy.  ANA screen negative, sedimentation rate 38, Sjogren's negative  Medications tried: Trileptal. Topiramate. Neurontin. Amitriptyline. Trileptal. Zanaflex. Effexor. She cannot take beta blockers or fluoxetine due to asthma.  Interval History 01/31/2016: She comes back for occipital neuralgiaand migraines. MRi of the brain and cervical spine unremarkable. She takes regular tylenol and this can be causing rebound however patient is still taking regardless of our instructions not to take daily or more than 2-3x in a week.Repeat MRi of the brain and cervical cord to evaluate for abnormal white matter changes NOT consistent with MS. Needs to follow with primary care for management of vascular risk factors and obesity as white matter changes likely chronic microvascular changes. MRI of the cervical spine normal. She does not take Ambien at night. She is  taking muscle relaxer. She has insomnia. She has anxiety. She in on multiple medications. She is seen at a pain clinic Dr. Rochele Pages at [facility].   - For her occipital neuralgia she did not tolerate: Occipital nerve blocks, Trileptal. Medications not helping: Neurontin. Intolerant of Topamax in the past.  - Start Amitriptyline very low dose which may help with occipital neuralgia and headache, her insomnia and chronic pain  MRI brain 12/26/2015: IMPRESSION: This MRI of the brain with and without contrast shows the following: 1. A few scattered T2/FLAIR hyperintense changes in the subcortical white matter and some subtle periventricular white matter changes. This is a nonspecific finding and could represent early chronic microvascular ischemic change or be the sequela of migraine. Demyelination is less likely to give this pattern. 2. There were no acute findings and there is a normal enhancement pattern. When compared to the MRI dated 03/31/2015, there were no definite interval changes.  MRI cervical spine: normal  IPJ:ASNKNLZ F Horneis a 42 y.o.femalehere as a referral from Dr. Massie Kluver occipital neuralgia. Past medical history of migraine, morbid obesity, anxiety, fibromyalgia, hypothyroidism, asthma, disc degenerative disease. She was diagnosed with Occipital Neuralgia at [facility]. Symptoms started 02/28/2015 awoke with a loud rining in the ears. No inciting event or trauma. Since June 11 it has been daily and excruciating. It is burning, aching, throbbing on the right side (points to the occipital area). Her jaw and ear hurts. She has neck pain. She endorses numbness in the left and weakness in the  arms. Her mouth went numb as well. MRi of the brain in 03/2015 with abnormal white matter periventricular. She also has migraines since 1997. She had abnormal white matter changes in the brain in 03/2015. Severe 10/10 pain continuous. Worse at night. Vision changes. Headaches. Dizziness. Weakness in  legs. Multiple neurologic symptoms for many years started when she was pregnant. No FHx MS or autoimmune disease   Reviewed notes, labs and imaging from outside physicians, which showed:   BMP unremarkable with normal creatinine 0.75, cbc unremarkable  Personally reviewed images and agree with the following: At least 5 small FLAIR hyperintensities in the bilateral cerebral white matter with hazy periventricular hyperintensity around the frontal horn of the left lateral ventricle and the body/atrium of the right lateral ventricle. The hazy areas have no volume loss/mass effect or enhancement. No juxta cortical, corpus callosum, or infratentorial signal abnormality. No anterior temporal involvement. No historical indication of immunocompromised state/HIV.  IMPRESSION: 1. No explanation for hearing loss. 2. 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  See HPI  ALLERGIES: Allergies  Allergen Reactions  . Beta Adrenergic Blockers Anaphylaxis  . Nsaids     Other reaction(s): Other Contraindicated due to bariatric surgery  . Tolmetin Other (See Comments)    Contraindicated due to bariatric surgery Contraindicated due to bariatric surgery    HOME MEDICATIONS: Outpatient Medications Prior to Visit  Medication Sig Dispense Refill  . albuterol (PROVENTIL HFA;VENTOLIN HFA) 108 (90 BASE) MCG/ACT inhaler Inhale 1 puff into the lungs every 6 (six) hours as needed for wheezing or shortness of breath.    Marland Kitchen albuterol (PROVENTIL) (2.5 MG/3ML) 0.083% nebulizer solution Take 2.5 mg by nebulization every 6 (six) hours as needed for wheezing or shortness of breath.    Marland Kitchen amitriptyline (ELAVIL) 25 MG tablet TAKE 4 TABLETS (100 MG TOTAL) BY MOUTH AT  BEDTIME. 120 tablet 5  . dicyclomine (BENTYL) 20 MG tablet Take 20 mg by mouth.    . escitalopram (LEXAPRO) 10 MG tablet Take 10 mg by mouth daily.    Marland Kitchen FERROUS SULFATE PO Take 150 mg by mouth 2 (two) times daily.    . furosemide (LASIX) 20 MG tablet Take 2 tablets (40 mg total) by mouth daily. (Patient taking differently: Take 40 mg by mouth daily as needed for fluid. ) 20 tablet 0  . Gabapentin 300 MG/6ML SOLN Take 900 mg by mouth. 18 mL    . Galcanezumab-gnlm (EMGALITY) 120 MG/ML SOAJ Inject 120 mg into the skin every 30 (thirty) days. 1 pen 11  . LORazepam (ATIVAN) 1 MG tablet Take 1 mg by mouth 2 (two) times daily as needed for anxiety.     . mometasone-formoterol (DULERA) 100-5 MCG/ACT AERO Inhale 2 puffs into the lungs 2 (two) times daily.    . montelukast (SINGULAIR) 10 MG tablet Take 10 mg by mouth daily.  5  . oxyCODONE ER (XTAMPZA ER) 13.5 MG C12A Take 13.5 mg by mouth 2 (two) times daily.    Marland Kitchen oxyCODONE-Acetaminophen (PERCOCET PO) Take 10 mg by mouth every 8 (eight) hours as needed.    . pantoprazole (PROTONIX) 40 MG tablet Take 40 mg by mouth 2 (two) times daily.     . promethazine (PHENERGAN) 25 MG tablet Take 25 mg by mouth every 4 (four)  hours as needed for nausea or vomiting.    Marland Kitchen tiZANidine (ZANAFLEX) 4 MG tablet TAKE 1-2 TABLETS (4-8 MG TOTAL) BY MOUTH EVERY 8 (EIGHT) HOURS AS NEEDED FOR MUSCLE SPASMS. (Patient taking differently: Take 4 mg by mouth at bedtime. ) 90 tablet 11  . topiramate (TOPAMAX) 200 MG tablet Take 1 tablet (200 mg total) by mouth at bedtime. 30 tablet 6  . OxyCODONE ER (XTAMPZA ER) 9 MG C12A Take 1 tablet by mouth every 12 (twelve) hours.    Marland Kitchen oxyCODONE-acetaminophen (PERCOCET/ROXICET) 5-325 MG per tablet Take 1-2 tablets by mouth every 6 (six) hours as needed for severe pain. (Patient taking differently: Take 1 tablet by mouth daily as needed. ) 6 tablet 0   No facility-administered medications prior to visit.     PAST MEDICAL HISTORY: Past Medical  History:  Diagnosis Date  . Anxiety   . Asthma   . Chronic pain syndrome   . Complication of anesthesia    "STATES WAKES UP AND CANT BREATHE AND NEEDS BREATHING TREATMENT  IN PACU"  . Dysrhythmia    palpitations- "from thyroid"  . Fibromyalgia   . Fracture of right foot   . Gastric ulcer 2018  . GERD (gastroesophageal reflux disease)   . Migraine   . Myofacial muscle pain   . Obesity   . Occipital neuralgia 2016  . OSA (obstructive sleep apnea)    Has CPAP  . Sleep apnea     NO CPAP--could not tolerates mask  . Thyroid disease   . Viral respiratory infection 06/26/13   ? influenza    PAST SURGICAL HISTORY: Past Surgical History:  Procedure Laterality Date  . ENDOMETRIAL ABLATION    . ENDOMETRIAL ABLATION  07/17/2017  . GALLBLADDER SURGERY  06/2016  . 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 NASAL SEPTUM SURGERY    . THYROID LOBECTOMY Right 07/19/2013   Procedure: THYROID LOBECTOMY;  Surgeon: Harl Bowie, MD;  Location: [facility];  Service: General;  Laterality: Right;  . TUBAL LIGATION    . WISDOM TOOTH EXTRACTION      FAMILY HISTORY: Family History  Problem Relation Age of Onset  . Hypertension Mother   . Hypertension Father   . Prostate cancer Father   . Diabetes Sister   . Cancer Paternal Grandmother        breast    SOCIAL HISTORY: Social History   Socioeconomic History  . Marital status: Legally Separated    Spouse name: Not on file  . Number of children: 1  . Years of education: GED  . Highest education level: Not on file  Occupational History  . Occupation: unemployed  Social Needs  . Financial resource strain: Not on file  . Food insecurity:    Worry: Not on file    Inability: Not on file  . Transportation needs:    Medical: Not on file    Non-medical: Not on file  Tobacco Use  . Smoking status: Former Smoker    Packs/day: 0.00    Years: 0.00    Pack years: 0.00    Types: Cigarettes    Last attempt to quit: 11/2015    Years since quitting:  2.4  . Smokeless tobacco: Never Used  Substance and Sexual Activity  . Alcohol use: No  . Drug use: No  . Sexual activity: Not on file  Lifestyle  . Physical activity:    Days per week: Not on file    Minutes per session: Not on file  .  Stress: Not on file  Relationships  . Social connections:    Talks on phone: Not on file    Gets together: Not on file    Attends religious service: Not on file    Active member of club or organization: Not on file    Attends meetings of clubs or organizations: Not on file    Relationship status: Not on file  . Intimate partner violence:    Fear of current or ex partner: Not on file    Emotionally abused: Not on file    Physically abused: Not on file    Forced sexual activity: Not on file  Other Topics Concern  . Not on file  Social History Narrative   Lives with father   Caffeine use: rare   Right handed      PHYSICAL EXAM  Vitals:   04/11/18 1354  BP: 113/72  Pulse: 75  Weight: 228 lb (103.4 kg)  Height: 5' 4.5" (1.638 m)   Body mass index is 38.53 kg/m.  Generalized: Well developed, in no acute distress   Neurological examination  Mentation: Alert oriented to time, place, history taking. Follows all commands speech and language fluent Cranial nerve II-XII: Pupils were equal round reactive to light. Extraocular movements were full, visual field were full on confrontational test. Facial sensation and strength were normal. Uvula tongue midline. Head turning and shoulder shrug  were normal and symmetric. Motor: The motor testing reveals 5 over 5 strength of all 4 extremities but giveaway weakness noted in the lower extremities.Kermit Balo symmetric motor tone is noted throughout.  Sensory: Sensory testing is intact to soft touch on all 4 extremities. No evidence of extinction is noted.  Coordination: Cerebellar testing reveals good finger-nose-finger and heel-to-shin bilaterally.  Gait and station: Gait is unsteady.  Patient uses a  cane.  Tandem gait not attempted. Reflexes: Deep tendon reflexes are symmetric and normal bilaterally.   DIAGNOSTIC DATA (LABS, IMAGING, TESTING) - I reviewed patient records, labs, notes, testing and imaging myself where available.  Lab Results  Component Value Date   WBC 7.6 10/12/2015   HGB 12.6 10/12/2015   HCT 40.2 10/12/2015   MCV 84.5 10/12/2015   PLT 352 10/12/2015      Component Value Date/Time   NA 136 10/12/2015 2340   K 4.6 10/12/2015 2340   CL 103 10/12/2015 2340   CO2 23 10/12/2015 2340   GLUCOSE 122 (H) 10/12/2015 2340   BUN 8 10/12/2015 2340   CREATININE 0.75 10/12/2015 2340   CALCIUM 8.7 (L) 10/12/2015 2340   PROT 7.6 01/04/2015 1338   ALBUMIN 3.7 01/04/2015 1338   AST 14 (L) 01/04/2015 1338   ALT 12 (L) 01/04/2015 1338   ALKPHOS 78 01/04/2015 1338   BILITOT 0.3 01/04/2015 1338   GFRNONAA >60 10/12/2015 2340   GFRAA >60 10/12/2015 2340      ASSESSMENT AND PLAN 42 y.o. year old female  has a past medical history of Anxiety, Asthma, Chronic pain syndrome, Complication of anesthesia, Dysrhythmia, Fibromyalgia, Fracture of right foot, Gastric ulcer (2018), GERD (gastroesophageal reflux disease), Migraine, Myofacial muscle pain, Obesity, Occipital neuralgia (2016), OSA (obstructive sleep apnea), Sleep apnea, Thyroid disease, and Viral respiratory infection (06/26/13). here with:  1.  Migraine headaches 2.  Chronic back pain  The patient's headaches seem to be controlled with Emgality, amitriptyline Topamax and tizanidine.  She will continue on these medications.  If her headache frequency increases she should let us know.  In regards to her ongoing  back pain and frequent falls she should follow-up with her orthopedist.  Advised that if her symptoms worsen or she develops new symptoms she should let us know.  She will follow-up in 1 year or sooner if needed.     Ward Givens, MSN, NP-C 04/11/2018, 2:13 PM [facility] [address],  [place], [place] [address] [phone]

## 2018-04-11 NOTE — Patient Instructions (Signed)
Your Plan:  Continue Emgality, Amitriptyline, Topamax and tizanidine Follow-up with Dr. Erlinda Hong for back pain If your symptoms worsen or you develop new symptoms please let us know.   Thank you for coming to see [place] at [facility]. I hope we have been able to provide you high quality care today.  You may receive a patient satisfaction survey over the next few weeks. We would appreciate your feedback and comments so that we may continue to improve ourselves and the health of our patients.

## 2018-04-11 NOTE — Progress Notes (Signed)
Made any corrections needed, and agree with history, physical, neuro exam,assessment and plan as stated above.     Antonia Ahern, MD [facility] 

## 2018-04-14 ENCOUNTER — Ambulatory Visit (INDEPENDENT_AMBULATORY_CARE_PROVIDER_SITE_OTHER): Payer: Medicaid Other | Admitting: Orthopaedic Surgery

## 2018-04-27 ENCOUNTER — Ambulatory Visit: Payer: Medicaid Other | Attending: Neurology

## 2018-04-27 ENCOUNTER — Ambulatory Visit (INDEPENDENT_AMBULATORY_CARE_PROVIDER_SITE_OTHER): Payer: Medicaid Other | Admitting: Orthopaedic Surgery

## 2018-04-27 ENCOUNTER — Other Ambulatory Visit: Payer: Self-pay

## 2018-04-27 DIAGNOSIS — M26609 Unspecified temporomandibular joint disorder, unspecified side: Secondary | ICD-10-CM | POA: Diagnosis present

## 2018-04-27 DIAGNOSIS — M542 Cervicalgia: Secondary | ICD-10-CM

## 2018-04-27 DIAGNOSIS — M62838 Other muscle spasm: Secondary | ICD-10-CM | POA: Diagnosis present

## 2018-04-27 DIAGNOSIS — R293 Abnormal posture: Secondary | ICD-10-CM

## 2018-04-27 DIAGNOSIS — G44221 Chronic tension-type headache, intractable: Secondary | ICD-10-CM

## 2018-04-27 NOTE — Therapy (Signed)
[place], [place], [address] Phone: [phone]   Fax:  [phone]  Physical Therapy Evaluation  Patient Details  Name: Cindy Hoffman MRN: [id number] Date of Birth: 1975-10-24 Referring Provider (PT): Sarina Ill, MD   Encounter Date: 04/27/2018  PT End of Session - 04/27/18 1514    Visit Number  1    Number of Visits  12    Date for PT Re-Evaluation  06/16/18    Authorization Type  MCD    PT Start Time  0300    PT Stop Time  0335    PT Time Calculation (min)  35 min    Activity Tolerance  Patient limited by pain;No increased pain    Behavior During Therapy  WFL for tasks assessed/performed       Past Medical History:  Diagnosis Date  . Anxiety   . Asthma   . Chronic pain syndrome   . Complication of anesthesia    "STATES WAKES UP AND CANT BREATHE AND NEEDS BREATHING TREATMENT  IN PACU"  . Dysrhythmia    palpitations- "from thyroid"  . Fibromyalgia   . Fracture of right foot   . Gastric ulcer 2018  . GERD (gastroesophageal reflux disease)   . Migraine   . Myofacial muscle pain   . Obesity   . Occipital neuralgia 2016  . OSA (obstructive sleep apnea)    Has CPAP  . Sleep apnea     NO CPAP--could not tolerates mask  . Thyroid disease   . Viral respiratory infection 06/26/13   ? influenza    Past Surgical History:  Procedure Laterality Date  . ENDOMETRIAL ABLATION    . ENDOMETRIAL ABLATION  07/17/2017  . GALLBLADDER SURGERY  06/2016  . 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 NASAL SEPTUM SURGERY    . THYROID LOBECTOMY Right 07/19/2013   Procedure: THYROID LOBECTOMY;  Surgeon: Harl Bowie, MD;  Location: [facility];  Service: General;  Laterality: Right;  . TUBAL LIGATION    . WISDOM TOOTH EXTRACTION      There were no vitals filed for this visit.   Subjective Assessment - 04/27/18 1508    Subjective  Neck pain started years ago.      occipital neuralgia started 3 years ago   2016. Migraines in 89 and migraines have been worse over time. .    Jaws click.         Patient is accompained by:  Family member   daughter   Limitations  House hold activities;Lifting   unable to function when bad.     Patient Stated Goals  She wants to have decr migraines, pain    Currently in Pain?  Yes    Pain Score  6     Pain Location  Neck   head   Pain Orientation  Posterior;Right    Pain Descriptors / Indicators  Aching    Pain Type  Chronic pain    Pain Radiating Towards  bilateral temples    Pain Onset  More than a month ago    Pain Frequency  Constant    Aggravating Factors   all activity    Pain Relieving Factors  meds, ice , limit stimulation    Multiple Pain Sites  --   LBP, pain into leg, knees        [facility] PT Assessment - 04/27/18 0001      Assessment   Medical Diagnosis  Cervicalgia  , myofascial ,  TMJ pain    Referring Provider (PT)  Sarina Ill, MD    Onset Date/Surgical Date  --   years ago   Next MD Visit  in a year    Prior Therapy  Yes PT earl;ier this year ,, stopped due to personal reasons.       Precautions   Precautions  None      Restrictions   Weight Bearing Restrictions  No      Balance Screen   Has the patient fallen in the past 6 months  Yes    How many times?  4   She reports going up steps and trips.    Has the patient had a decrease in activity level because of a fear of falling?   Yes    Is the patient reluctant to leave their home because of a fear of falling?   No      Prior Function   Level of Independence  Needs assistance with homemaking;Needs assistance with ADLs    Vocation  Unemployed      Cognition   Overall Cognitive Status  Within Functional Limits for tasks assessed      Posture/Postural Control   Posture Comments  forward head and rounded shoulders      ROM / Strength   AROM / PROM / Strength  AROM;PROM;Strength      AROM   AROM Assessment Site  Cervical    Cervical Flexion  45    Cervical Extension   45    Cervical - Right Side Bend  40    Cervical - Left Side Bend  20    Cervical - Right Rotation  45    Cervical - Left Rotation  40      PROM   Overall PROM Comments  Shoulders limited , decr retraction  cervical and scapula      Strength   Overall Strength Comments  UE appear WNL  but with pain on RT       Palpation   Palpation comment  stiff and tender in soft tissue paraspinals and suvb occipita;l                Objective measurements completed on examination: See above findings.              PT Education - 04/27/18 1532    Education Details  POC,  benefits and need to do her HEP    Person(s) Educated  Patient    Methods  Explanation    Comprehension  Verbalized understanding       PT Short Term Goals - 04/27/18 1543      PT SHORT TERM GOAL #1   Title  pt to be I with initial HEP     Baseline  has not been doing HEP    Time  3    Period  Weeks    Status  New      PT SHORT TERM GOAL #2   Title  pt to verbalize/ demo proper posture to prevent and reduce neck and jaw pain     Baseline  foreward head posture    Time  3    Period  Weeks    Status  New      PT SHORT TERM GOAL #3   Title  reduce masseter / upper trap and surrounding muscle spasm to promote pain relief to 4/10 and promote cervical mobility     Baseline  Dcr neck ROm and pain 7/10  at eval    Time  3    Period  Weeks    Status  New        PT Long Term Goals - 04/27/18 1544      PT LONG TERM GOAL #1   Title  increase all cervical motions by >/= 10 degrees with </= 2/10 pain for functional mobility for safety with driving     Baseline  cervical flexion 45 degrees  , extension 45 degrees , rotation RT 45 Lt 40 degrees  side bending LT 20 degrees    Time  6    Period  Weeks    Status  New      PT LONG TERM GOAL #2   Title  pt to report decrease HA frequency to </= 1 weekly and migraines 1 every 2 weeks to demo improvement in condition    Baseline  at least 1x/week HA     Time  6    Period  Weeks    Status  New      PT LONG TERM GOAL #3   Title  pt to exhibit equal opening of the mandibal with no curvature or deflection noted to demonstrate improvement in R TMJ and reduced masseter tightness with </= 1/10 pain for QOL    Baseline  No curvature or deflection noted; however, pt could not reach end range opening today. Pt's pain 7/10 today     Time  6    Period  Weeks    Status  New      PT LONG TERM GOAL #4   Title  pt to be I with all HEP given as of last visit to maintain and progress current level of function     Baseline  Not doing initial HEp from last episode of care    Time  6    Period  Weeks    Status  New             Plan - 04/27/18 1533    Clinical Impression Statement  Ms Concepcion returns with the same complaints of headaches , neck pain and limiting her tolerance to normal activity. She has poor posture with forward head and rounded shoulders. These postures are not fixed but manually or activity she was not able to come completely out of theses positions. Her ROM is decreased and asymetric.    She  reports some benefit with dry needling but I suggested that without doing her HEP the benefits will be limited.   She should make some progress with skilled PT.     History and Personal Factors relevant to plan of care:  multiple pain areas that are chronic , obesity , anxiety, decr follow through with hEP    Clinical Presentation  Unstable    Clinical Presentation due to:  chronic headaches and neck pain    Clinical Decision Making  Moderate    Rehab Potential  Fair    PT Frequency  1x / week    PT Duration  3 weeks   then 1x/week for 4 weeks and if improving assess contnuation then   PT Treatment/Interventions  Dry needling;Manual techniques;Patient/family education;Therapeutic exercise;Cryotherapy;Electrical Stimulation;Moist Heat;Traction;Ultrasound;Passive range of motion    PT Next Visit Plan  DN and review of HEP from last episode of  care, modalities and manual    Consulted and Agree with Plan of Care  Patient       Patient will benefit from skilled therapeutic intervention in order to  improve the following deficits and impairments:  Obesity, Pain, Postural dysfunction, Decreased range of motion, Decreased activity tolerance, Increased muscle spasms, Decreased cognition  Visit Diagnosis: Cervicalgia  TMJ (temporomandibular joint disorder)  Abnormal posture  Other muscle spasm  Chronic tension-type headache, intractable     Problem List Patient Active Problem List   Diagnosis Date Noted  . Chronic low back pain 08/30/2017  . Chronic pain of both knees 08/30/2017  . Chronic migraine without aura, with intractable migraine, so stated, with status migrainosus 03/06/2017  . Intractable chronic migraine without aura and without status migrainosus 09/29/2016  . Occipital neuralgia 12/12/2015  . Thyroid nodule 07/19/2013  . Right thyroid nodule 06/18/2013  . Morbid obesity ([place]) 05/01/2013  . Asthma, chronic 02/01/2013  . Smoker 02/01/2013    Darrel Hoover  PT 04/27/2018, 3:53 PM  [facility] [address] [place], [place], [address] Phone: [phone]   Fax:  [phone]  Name: Cindy Hoffman MRN: [id number] Date of Birth: 1976-04-21

## 2018-05-18 ENCOUNTER — Ambulatory Visit: Payer: Medicaid Other | Attending: Neurology | Admitting: Physical Therapy

## 2018-05-18 ENCOUNTER — Encounter: Payer: Self-pay | Admitting: Physical Therapy

## 2018-05-18 DIAGNOSIS — M542 Cervicalgia: Secondary | ICD-10-CM | POA: Diagnosis present

## 2018-05-18 DIAGNOSIS — R293 Abnormal posture: Secondary | ICD-10-CM | POA: Insufficient documentation

## 2018-05-18 DIAGNOSIS — M62838 Other muscle spasm: Secondary | ICD-10-CM | POA: Diagnosis present

## 2018-05-18 DIAGNOSIS — M26609 Unspecified temporomandibular joint disorder, unspecified side: Secondary | ICD-10-CM | POA: Diagnosis present

## 2018-05-18 DIAGNOSIS — G44221 Chronic tension-type headache, intractable: Secondary | ICD-10-CM | POA: Diagnosis present

## 2018-05-18 NOTE — Therapy (Signed)
Cindy Hoffman, [place], [address] Phone: [phone]   Fax:  [phone]  Physical Therapy Treatment  Patient Details  Name: Cindy Hoffman MRN: [id number] Date of Birth: Oct 29, 1975 Referring Provider (PT): Sarina Ill, MD   Encounter Date: 05/18/2018  PT End of Session - 05/18/18 1505    Visit Number  2    Number of Visits  12    Date for PT Re-Evaluation  06/16/18    Authorization Type  MCD (resubmit at 4th visit)    Authorization Time Period  initial auth 05/15/2018 - 06/04/2018    Authorization - Visit Number  1    Authorization - Number of Visits  3    PT Start Time  1505    PT Stop Time  1545    PT Time Calculation (min)  40 min    Activity Tolerance  Patient tolerated treatment well    Behavior During Therapy  [facility] for tasks assessed/performed       Past Medical History:  Diagnosis Date  . Anxiety   . Asthma   . Chronic pain syndrome   . Complication of anesthesia    "STATES WAKES UP AND CANT BREATHE AND NEEDS BREATHING TREATMENT  IN PACU"  . Dysrhythmia    palpitations- "from thyroid"  . Fibromyalgia   . Fracture of right foot   . Gastric ulcer 2018  . GERD (gastroesophageal reflux disease)   . Migraine   . Myofacial muscle pain   . Obesity   . Occipital neuralgia 2016  . OSA (obstructive sleep apnea)    Has CPAP  . Sleep apnea     NO CPAP--could not tolerates mask  . Thyroid disease   . Viral respiratory infection 06/26/13   ? influenza    Past Surgical History:  Procedure Laterality Date  . ENDOMETRIAL ABLATION    . ENDOMETRIAL ABLATION  07/17/2017  . GALLBLADDER SURGERY  06/2016  . 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 NASAL SEPTUM SURGERY    . THYROID LOBECTOMY Right 07/19/2013   Procedure: THYROID LOBECTOMY;  Surgeon: Harl Bowie, MD;  Location: [facility];  Service: General;  Laterality: Right;  . TUBAL LIGATION    . WISDOM TOOTH EXTRACTION      There were no vitals filed  for this visit.  Subjective Assessment - 05/18/18 1506    Subjective  "I still have alot of pain the back of the head"     Patient Stated Goals  She wants to have decr migraines, pain    Currently in Pain?  Yes    Pain Score  5     Pain Location  Neck    Pain Orientation  Right;Posterior    Pain Descriptors / Indicators  Aching    Pain Type  Chronic pain    Pain Onset  More than a month ago    Pain Frequency  Constant    Aggravating Factors   sitting still for a long period of time    Pain Relieving Factors  stretching, laying down, medication.                        [facility] Adult PT Treatment/Exercise - 05/18/18 1515      Exercises   Exercises  Neck      Neck Exercises: Supine   Neck Retraction  10 reps;5 secs      Shoulder Exercises: Seated   Retraction  10 reps;Strengthening  x 2 sets holding ea. rep 3 seconds     Manual Therapy   Manual Therapy  Soft tissue mobilization    Manual therapy comments  skilled palpation and monitoring of pt throughout TPDN    Soft tissue mobilization  IATSM along bil sub-occipitals and bil upper trap      Neck Exercises: Stretches   Upper Trapezius Stretch  2 reps;30 seconds       Trigger Point Dry Needling - 05/18/18 1635    Consent Given?  Yes    Education Handout Provided  Yes    Muscles Treated Upper Body  Upper trapezius;Suboccipitals muscle group    Upper Trapezius Response  Twitch reponse elicited;Palpable increased muscle length    SubOccipitals Response  Palpable increased muscle length;Twitch response elicited           PT Education - 05/18/18 1630    Education Details  reviewed DN and benefits. updated HEP and discussed home TENS pad placement and usage.     Person(s) Educated  Patient    Methods  Explanation;Verbal cues;Handout    Comprehension  Verbalized understanding;Verbal cues required       PT Short Term Goals - 04/27/18 1543      PT SHORT TERM GOAL #1   Title  pt to be I with initial HEP      Baseline  has not been doing HEP    Time  3    Period  Weeks    Status  New      PT SHORT TERM GOAL #2   Title  pt to verbalize/ demo proper posture to prevent and reduce neck and jaw pain     Baseline  foreward head posture    Time  3    Period  Weeks    Status  New      PT SHORT TERM GOAL #3   Title  reduce masseter / upper trap and surrounding muscle spasm to promote pain relief to 4/10 and promote cervical mobility     Baseline  Dcr neck ROm and pain 7/10 at eval    Time  3    Period  Weeks    Status  New        PT Long Term Goals - 04/27/18 1544      PT LONG TERM GOAL #1   Title  increase all cervical motions by >/= 10 degrees with </= 2/10 pain for functional mobility for safety with driving     Baseline  cervical flexion 45 degrees  , extension 45 degrees , rotation RT 45 Lt 40 degrees  side bending LT 20 degrees    Time  6    Period  Weeks    Status  New      PT LONG TERM GOAL #2   Title  pt to report decrease HA frequency to </= 1 weekly and migraines 1 every 2 weeks to demo improvement in condition    Baseline  at least 1x/week HA    Time  6    Period  Weeks    Status  New      PT LONG TERM GOAL #3   Title  pt to exhibit equal opening of the mandibal with no curvature or deflection noted to demonstrate improvement in R TMJ and reduced masseter tightness with </= 1/10 pain for QOL    Baseline  No curvature or deflection noted; however, pt could not reach end range opening today. Pt's pain 7/10 today  Time  6    Period  Weeks    Status  New      PT LONG TERM GOAL #4   Title  pt to be I with all HEP given as of last visit to maintain and progress current level of function     Baseline  Not doing initial HEp from last episode of care    Time  6    Period  Weeks    Status  New            Plan - 05/18/18 1631    Clinical Impression Statement  pt reports some improvement in pain since the last session. reviewed DN and consent was provided for  sub-occipitals and uppe r trap followed with IASTM. reviewed HEp and updated to promote good posture and reduce tension in the neck. end of session she reported reduction of stiffness/ pain.     PT Treatment/Interventions  Dry needling;Manual techniques;Patient/family education;Therapeutic exercise;Cryotherapy;Electrical Stimulation;Moist Heat;Traction;Ultrasound;Passive range of motion    PT Next Visit Plan  DN and review of HEP from last episode of care, modalities and manual    PT Home Exercise Plan  chin tuck, scapular retraction upper trap stretch       Patient will benefit from skilled therapeutic intervention in order to improve the following deficits and impairments:  Obesity, Pain, Postural dysfunction, Decreased range of motion, Decreased activity tolerance, Increased muscle spasms, Decreased cognition  Visit Diagnosis: Cervicalgia     Problem List Patient Active Problem List   Diagnosis Date Noted  . Chronic low back pain 08/30/2017  . Chronic pain of both knees 08/30/2017  . Chronic migraine without aura, with intractable migraine, so stated, with status migrainosus 03/06/2017  . Intractable chronic migraine without aura and without status migrainosus 09/29/2016  . Occipital neuralgia 12/12/2015  . Thyroid nodule 07/19/2013  . Right thyroid nodule 06/18/2013  . Morbid obesity ([place]) 05/01/2013  . Asthma, chronic 02/01/2013  . Smoker 02/01/2013   Starr Lake PT, DPT, LAT, ATC  05/18/18  4:36 PM      [facility] [address] [place], [place], [address] Phone: [phone]   Fax:  [phone]  Name: ARASELI SHERRY MRN: [id number] Date of Birth: 08-Jan-1976

## 2018-05-18 NOTE — Patient Instructions (Addendum)
TENS stands for Transcutaneous Electrical Nerve Stimulation. In other words, electrical impulses are allowed to pass through the skin in order to excite a nerve.   Purpose and Use of TENS:  TENS is a method used to manage acute and chronic pain without the use of drugs. It has been effective in managing pain associated with surgery, sprains, strains, trauma, rheumatoid arthritis, and neuralgias. It is a non-addictive, low risk, and non-invasive technique used to control pain. It is not, by any means, a curative form of treatment.   How TENS Works:  Most TENS units are a small pocket-sized unit powered by one 9 volt battery. Attached to the outside of the unit are two lead wires where two pins and/or snaps connect on each wire. All units come with a set of four reusable pads or electrodes. These are placed on the skin surrounding the area involved. By inserting the leads into  the pads, the electricity can pass from the unit making the circuit complete.  As the intensity is turned up slowly, the electrical current enters the body from the electrodes through the skin to the surrounding nerve fibers. This triggers the release of hormones from within the body. These hormones contain pain relievers. By increasing the circulation of these hormones, the person's pain may be lessened. It is also believed that the electrical stimulation itself helps to block the pain messages being sent to the brain, thus also decreasing the body's perception of pain.   Hazards:  TENS units are NOT to be used by patients with PACEMAKERS, DEFIBRILLATORS, DIABETIC PUMPS, PREGNANT WOMEN, and patients with SEIZURE DISORDERS.  TENS units are NOT to be used over the heart, throat, brain, or spinal cord.  One of the major side effects from the TENS unit may be skin irritation. Some people may develop a rash if they are sensitive to the materials used in the electrodes or the connecting wires.     Avoid overuse due the body getting  used to the stem making it not as effective over time.     

## 2018-05-23 ENCOUNTER — Ambulatory Visit (INDEPENDENT_AMBULATORY_CARE_PROVIDER_SITE_OTHER): Payer: Medicaid Other | Admitting: Orthopaedic Surgery

## 2018-05-23 ENCOUNTER — Ambulatory Visit (INDEPENDENT_AMBULATORY_CARE_PROVIDER_SITE_OTHER): Payer: Medicaid Other

## 2018-05-23 DIAGNOSIS — R102 Pelvic and perineal pain: Secondary | ICD-10-CM | POA: Diagnosis not present

## 2018-05-23 DIAGNOSIS — M25551 Pain in right hip: Secondary | ICD-10-CM | POA: Diagnosis not present

## 2018-05-23 MED ORDER — METHYLPREDNISOLONE ACETATE 40 MG/ML IJ SUSP: 40.0000 mg | Freq: Once | INTRAMUSCULAR | Status: DC

## 2018-05-23 NOTE — Progress Notes (Signed)
Office Visit Note   Patient: Cindy Hoffman           Date of Birth: 07/12/1975           MRN: [id number] Visit Date: 05/23/2018              Requested by: Cindy Harvey, NP [place], [place] [address] PCP: Cindy Harvey, NP   Assessment & Plan: Visit Diagnoses:  1. Pain in pelvis     Plan: Impression is right hip labral tear.  X-rays do not show any acute abnormalities or degenerative changes.  We referred her to Dr. Junius Hoffman for a cortisone injection which she tolerated well.  We will see her back as needed.  Follow-Up Instructions: Return if symptoms worsen or fail to improve.   Orders:  Orders Placed This Encounter  Procedures  . XR Pelvis 1-2 Views   Meds ordered this encounter  Medications  . methylPREDNISolone acetate (DEPO-MEDROL) injection 40 mg      Procedures: No procedures performed   Clinical Data: No additional findings.   Subjective: Chief Complaint  Patient presents with  . Right Leg - Pain  . Pelvis - Pain    Cindy Hoffman comes in today for right hip and groin pain status post mechanical fall in November 2019.  She has been ambulating with a cane since then.  She does have chronic back pain that is nonsurgical.  She does not endorse any radiation of pain or radicular symptoms.  She has trouble flexing her hip secondary to the pain.  She states that the pain causes her right hip catch.   Review of Systems  Constitutional: Negative.   HENT: Negative.   Eyes: Negative.   Respiratory: Negative.   Cardiovascular: Negative.   Endocrine: Negative.   Musculoskeletal: Negative.   Neurological: Negative.   Hematological: Negative.   Psychiatric/Behavioral: Negative.   All other systems reviewed and are negative.    Objective: Vital Signs: There were no vitals taken for this visit.  Physical Exam Vitals signs and nursing note reviewed.  Constitutional:      Appearance: She is well-developed.  Pulmonary:     Effort:  Pulmonary effort is normal.  Skin:    General: Skin is warm.     Capillary Refill: Capillary refill takes less than 2 seconds.  Neurological:     Mental Status: She is alert and oriented to person, place, and time.  Psychiatric:        Behavior: Behavior normal.        Thought Content: Thought content normal.        Judgment: Judgment normal.     Ortho Exam Right hip exam shows pain with hip flexion and external rotation.  Positive logroll.  Negative sciatic tension sign. Specialty Comments:  No specialty comments available.  Imaging: Xr Pelvis 1-2 Views  Result Date: 05/23/2018 No acute or structural abnormalities.    PMFS History: Patient Active Problem List   Diagnosis Date Noted  . Chronic low back pain 08/30/2017  . Chronic pain of both knees 08/30/2017  . Chronic migraine without aura, with intractable migraine, so stated, with status migrainosus 03/06/2017  . Intractable chronic migraine without aura and without status migrainosus 09/29/2016  . Occipital neuralgia 12/12/2015  . Thyroid nodule 07/19/2013  . Right thyroid nodule 06/18/2013  . Morbid obesity ([place]) 05/01/2013  . Asthma, chronic 02/01/2013  . Smoker 02/01/2013   Past Medical History:  Diagnosis Date  .  Anxiety   . Asthma   . Chronic pain syndrome   . Complication of anesthesia    "STATES WAKES UP AND CANT BREATHE AND NEEDS BREATHING TREATMENT  IN PACU"  . Dysrhythmia    palpitations- "from thyroid"  . Fibromyalgia   . Fracture of right foot   . Gastric ulcer 2018  . GERD (gastroesophageal reflux disease)   . Migraine   . Myofacial muscle pain   . Obesity   . Occipital neuralgia 2016  . OSA (obstructive sleep apnea)    Has CPAP  . Sleep apnea     NO CPAP--could not tolerates mask  . Thyroid disease   . Viral respiratory infection 06/26/13   ? influenza    Family History  Problem Relation Age of Onset  . Hypertension Mother   . Hypertension Father   . Prostate cancer Father   .  Diabetes Sister   . Cancer Paternal Grandmother        breast    Past Surgical History:  Procedure Laterality Date  . ENDOMETRIAL ABLATION    . ENDOMETRIAL ABLATION  07/17/2017  . GALLBLADDER SURGERY  06/2016  . 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 NASAL SEPTUM SURGERY    . THYROID LOBECTOMY Right 07/19/2013   Procedure: THYROID LOBECTOMY;  Surgeon: Cindy Bowie, MD;  Location: [facility];  Service: General;  Laterality: Right;  . TUBAL LIGATION    . WISDOM TOOTH EXTRACTION     Social History   Occupational History  . Occupation: unemployed  Tobacco Use  . Smoking status: Former Smoker    Packs/day: 0.00    Years: 0.00    Pack years: 0.00    Types: Cigarettes    Last attempt to quit: 11/2015    Years since quitting: 2.5  . Smokeless tobacco: Never Used  Substance and Sexual Activity  . Alcohol use: No  . Drug use: No  . Sexual activity: Not on file

## 2018-05-23 NOTE — Progress Notes (Signed)
Subjective: She is here with persistent right hip pain status post fall.  Current pain, walks with a limp and uses a cane.  She is here for ultrasound-guided injection.  Objective: Tender to palpation over the greater trochanter and pain with passive flexion and internal rotation.  Procedure: Ultrasound-guided right hip injection: After sterile prep with Betadine, injected 8 cc 1% lidocaine without epinephrine and 40 mg methylprednisolone into the femoral head/neck junction, passing the needle through the iliofemoral ligament.  Injectate was seen filling the joint capsule.  She had good relief during the immediate anesthetic phase.  Follow-up as directed.

## 2018-05-25 ENCOUNTER — Encounter: Payer: Medicaid Other | Admitting: Physical Therapy

## 2018-05-30 ENCOUNTER — Telehealth: Payer: Self-pay | Admitting: Neurology

## 2018-05-30 ENCOUNTER — Telehealth (INDEPENDENT_AMBULATORY_CARE_PROVIDER_SITE_OTHER): Payer: Self-pay | Admitting: Orthopaedic Surgery

## 2018-05-30 DIAGNOSIS — R102 Pelvic and perineal pain: Secondary | ICD-10-CM

## 2018-05-30 NOTE — Telephone Encounter (Signed)
Patient called stating her right hip is hurting pretty bad. Patient asked if she can get set up for an MRI. The number to contact patient is [phone]

## 2018-05-30 NOTE — Telephone Encounter (Signed)
Pt states she is needing an appt with Dr. Jaynee Eagles soon for her Migraines  but there is no availability until April. Pt can not wait that long. Pt requests a call from the RN. Please advise.

## 2018-05-30 NOTE — Telephone Encounter (Signed)
I called patient. She is willing to see Amy in a sooner opening. I scheduled her for Fri 06/16/2018 @ 09:00 arrival 15-30 minutes prior. Patient stated that she is having 1-2 headaches per week and they are not well controlled. She also stated that she hasn't had Botox since last May and she doesn't know why. I informed her that I would check with Danielle. Patient verbalized appreciation.

## 2018-05-31 ENCOUNTER — Ambulatory Visit: Payer: Medicaid Other | Admitting: Physical Therapy

## 2018-05-31 ENCOUNTER — Encounter: Payer: Self-pay | Admitting: Physical Therapy

## 2018-05-31 DIAGNOSIS — M542 Cervicalgia: Secondary | ICD-10-CM | POA: Diagnosis not present

## 2018-05-31 DIAGNOSIS — M26609 Unspecified temporomandibular joint disorder, unspecified side: Secondary | ICD-10-CM

## 2018-05-31 DIAGNOSIS — M62838 Other muscle spasm: Secondary | ICD-10-CM

## 2018-05-31 DIAGNOSIS — G44221 Chronic tension-type headache, intractable: Secondary | ICD-10-CM

## 2018-05-31 DIAGNOSIS — R293 Abnormal posture: Secondary | ICD-10-CM

## 2018-05-31 NOTE — Telephone Encounter (Signed)
I spoke with the patient regarding her new appointments scheduled. She decided to cancel the 2/14 appt with Amy NP and get started back on Botox. She is aware that she can always come back sooner for ov if needed. Pt verbalized appreciation. Her botox appt is scheduled for 06/09/18 @ 08:30 AM.

## 2018-05-31 NOTE — Telephone Encounter (Signed)
MRI order entered, need to call patient and advise.

## 2018-05-31 NOTE — Telephone Encounter (Signed)
Please advise 

## 2018-05-31 NOTE — Telephone Encounter (Signed)
Late entry; I called the patient and discussed Botox with her. She stated that she did not know why she didn't have any more apt's made after her May Botox. I advised her that we called her to schedule and left a message. I also told her that her apts should be every three months and she needed to make sure that she made an apt every three months. She stated that she "knew something was wrong" because she had a disability hearing yesterday and the notes they were reading from her last office visit from [facility] were incorrect. She states that the office visit notes stated that she has well managed headaches and that is not the case. She thinks there was some miscommunication. We scheduled an apt for her Botox and I told her that she needed to speak with the SP to schedule delivery.

## 2018-05-31 NOTE — Telephone Encounter (Signed)
Right hip MR arthrogram r/o labral tear

## 2018-05-31 NOTE — Therapy (Addendum)
[address], [place], [address] Phone: [phone]   Fax:  [phone]  Physical Therapy Treatment / Discharge  Patient Details  Name: Cindy Hoffman MRN: [id number] Date of Birth: 20-Mar-1976 Referring Provider (PT): Sarina Ill, MD   Encounter Date: 05/31/2018  PT End of Session - 05/31/18 1506    Visit Number  3    Number of Visits  12    Date for PT Re-Evaluation  06/16/18    Authorization Type  MCD (resubmit at 4th visit)    Authorization Time Period  initial auth 05/15/2018 - 06/04/2018  (resubmitted on 05/31/2018)    Authorization - Visit Number  2    Authorization - Number of Visits  3    PT Start Time  1505   pt arrived 5 min    PT Stop Time  1545    PT Time Calculation (min)  40 min    Activity Tolerance  Patient tolerated treatment well    Behavior During Therapy  [facility] for tasks assessed/performed       Past Medical History:  Diagnosis Date  . Anxiety   . Asthma   . Chronic pain syndrome   . Complication of anesthesia    "STATES WAKES UP AND CANT BREATHE AND NEEDS BREATHING TREATMENT  IN PACU"  . Dysrhythmia    palpitations- "from thyroid"  . Fibromyalgia   . Fracture of right foot   . Gastric ulcer 2018  . GERD (gastroesophageal reflux disease)   . Migraine   . Myofacial muscle pain   . Obesity   . Occipital neuralgia 2016  . OSA (obstructive sleep apnea)    Has CPAP  . Sleep apnea     NO CPAP--could not tolerates mask  . Thyroid disease   . Viral respiratory infection 06/26/13   ? influenza    Past Surgical History:  Procedure Laterality Date  . ENDOMETRIAL ABLATION    . ENDOMETRIAL ABLATION  07/17/2017  . GALLBLADDER SURGERY  06/2016  . 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 NASAL SEPTUM SURGERY    . THYROID LOBECTOMY Right 07/19/2013   Procedure: THYROID LOBECTOMY;  Surgeon: Harl Bowie, MD;  Location: [facility];  Service: General;  Laterality: Right;  . TUBAL LIGATION    .  WISDOM TOOTH EXTRACTION      There were no vitals filed for this visit.  Subjective Assessment - 05/31/18 1507    Subjective  "i am not too bad today in my neck. My had hip pain that is up from alot of walking/ standing going to disability court"     Patient Stated Goals  She wants to have decr migraines, pain    Currently in Pain?  Yes    Pain Score  4     Pain Orientation  Right    Pain Type  Chronic pain    Aggravating Factors   unsure it just does what it wants    Pain Relieving Factors  stretching, laying down, medication.         [facility] PT Assessment - 05/31/18 1511      AROM   Cervical Flexion  40    Cervical Extension  32    Cervical - Right Side Bend  24    Cervical - Left Side Bend  20    Cervical - Right Rotation  44    Cervical - Left Rotation  58      Strength   Overall Strength  Comments  UE appear WNL  but with pain on RT       Palpation   Palpation comment  stiff and tender in soft tissue paraspinals and suvb occipitals, and teres minor                   [facility] Adult PT Treatment/Exercise - 05/31/18 1531      Neck Exercises: Seated   Other Seated Exercise  scapular retraction with ER 2 x 10 with red theraband    Other Seated Exercise  rows 2 x 10 with REdtheraband      Neck Exercises: Supine   Neck Retraction  10 reps;5 secs      Manual Therapy   Manual Therapy  Soft tissue mobilization    Manual therapy comments  skilled palpation and monitoring of pt throughout TPDN    Soft tissue mobilization  IATSM along bil sub-occipitals and bil upper trap      Neck Exercises: Stretches   Upper Trapezius Stretch  2 reps;30 seconds       Trigger Point Dry Needling - 05/31/18 1529    Consent Given?  Yes    Education Handout Provided  No    Muscles Treated Upper Body  Upper trapezius;Levator scapulae   teres minor   Upper Trapezius Response  Twitch reponse elicited;Palpable increased muscle length    SubOccipitals Response  Twitch response  elicited;Palpable increased muscle length    Levator Scapulae Response  Twitch response elicited;Palpable increased muscle length             PT Short Term Goals - 05/31/18 1514      PT SHORT TERM GOAL #1   Title  pt to be I with initial HEP     Time  3    Period  Weeks    Status  Achieved      PT SHORT TERM GOAL #2   Title  pt to verbalize/ demo proper posture to prevent and reduce neck and jaw pain     Baseline  has been working it frequent    Time  3    Period  Weeks    Status  Achieved      PT SHORT TERM GOAL #3   Title  reduce masseter / upper trap and surrounding muscle spasm to promote pain relief to 4/10 and promote cervical mobility     Time  3    Period  Weeks    Status  Achieved        PT Long Term Goals - 05/31/18 1515      PT LONG TERM GOAL #1   Title  increase all cervical motions by >/= 10 degrees with </= 2/10 pain for functional mobility for safety with driving     Baseline  cervical flexion 32 degrees  , extension 40 degrees , rotation RT 44 Lt 58 degrees  side bending LT 20 degrees    Time  6    Period  Weeks    Status  On-going    Target Date  07/12/18      PT LONG TERM GOAL #2   Title  pt to report decrease HA frequency to </= 1 weekly and migraines 1 every 2 weeks to demo improvement in condition    Baseline  at least 1 - 2 x/week HA occasionally more    Time  6    Status  On-going    Target Date  07/12/18      PT LONG  TERM GOAL #3   Title  pt to exhibit equal opening of the mandibal with no curvature or deflection noted to demonstrate improvement in R TMJ and reduced masseter tightness with </= 1/10 pain for QOL    Time  6    Period  Weeks    Status  Achieved    Target Date  07/12/18      PT LONG TERM GOAL #4   Title  pt to be I with all HEP given as of last visit to maintain and progress current level of function     Baseline  independent with current HEP and progress as able.     Time  6    Period  Weeks    Status  On-going     Target Date  07/12/18            Plan - 05/31/18 1658    Clinical Impression Statement  Cindy Hoffman reports feeling better in the neck and demosntrates some improvement in ROM with L rotation but demonstrates limited flexion/ extension and sidebending with pain noted at end ranges. She is met her STG's and is making progress toward her long term goals. continued TPDN for R upper trap, levator, and sub-occipitals followed with IASTM. pt is highly motivated to continued with physical therapy to reduce HA, neck and shoulder pain. she would benefit from continued physical therapy to reduce muscle tension, promote mobility, maximize function and work toward remaining goals and independent exercise.     PT Frequency  1x / week    PT Duration  6 weeks    PT Treatment/Interventions  Dry needling;Manual techniques;Patient/family education;Therapeutic exercise;Cryotherapy;Electrical Stimulation;Moist Heat;Traction;Ultrasound;Passive range of motion    PT Next Visit Plan  DN and review of HEP from last episode of care, modalities and manual    PT Home Exercise Plan  chin tuck, scapular retraction upper trap stretch    Consulted and Agree with Plan of Care  Patient       Patient will benefit from skilled therapeutic intervention in order to improve the following deficits and impairments:  Obesity, Pain, Postural dysfunction, Decreased range of motion, Decreased activity tolerance, Increased muscle spasms, Decreased cognition  Visit Diagnosis: Cervicalgia  TMJ (temporomandibular joint disorder)  Abnormal posture  Other muscle spasm  Chronic tension-type headache, intractable     Problem List Patient Active Problem List   Diagnosis Date Noted  . Chronic low back pain 08/30/2017  . Chronic pain of both knees 08/30/2017  . Chronic migraine without aura, with intractable migraine, so stated, with status migrainosus 03/06/2017  . Intractable chronic migraine without aura and without status  migrainosus 09/29/2016  . Occipital neuralgia 12/12/2015  . Thyroid nodule 07/19/2013  . Right thyroid nodule 06/18/2013  . Morbid obesity ([place]) 05/01/2013  . Asthma, chronic 02/01/2013  . Smoker 02/01/2013   Starr Lake PT, DPT, LAT, ATC  05/31/18  5:08 PM      [facility] [address] [place], [place], [address] Phone: [phone]   Fax:  [phone]  Name: Cindy Hoffman MRN: [id number] Date of Birth: 07/30/1975      PHYSICAL THERAPY DISCHARGE SUMMARY  Visits from Start of Care: 3  Current functional level related to goals / functional outcomes: See goals   Remaining deficits: unknow due to COVID related clinic closure   Education / Equipment: HEP  Plan: Patient agrees to discharge.  Patient goals were not met. Patient is being discharged due to not returning since  the last visit.  ?????         Kratos Ruscitti PT, DPT, LAT, ATC  12/25/18  10:32 AM

## 2018-06-01 NOTE — Telephone Encounter (Signed)
IC patient and advise MRI ordered.

## 2018-06-02 ENCOUNTER — Ambulatory Visit
Admission: RE | Admit: 2018-06-02 | Discharge: 2018-06-02 | Disposition: A | Discharge status: Home / Self Care | Payer: Medicaid Other | Source: Ambulatory Visit | Attending: Orthopaedic Surgery | Admitting: Orthopaedic Surgery

## 2018-06-02 DIAGNOSIS — R102 Pelvic and perineal pain: Secondary | ICD-10-CM

## 2018-06-02 MED ORDER — IOPAMIDOL (ISOVUE-M 200) INJECTION 41%
15.0000 mL | Freq: Once | INTRAMUSCULAR | Status: AC
  Administered 2018-06-02: 15 mL via INTRA_ARTICULAR

## 2018-06-06 ENCOUNTER — Other Ambulatory Visit: Payer: Self-pay | Admitting: *Deleted

## 2018-06-06 ENCOUNTER — Telehealth: Payer: Self-pay | Admitting: Neurology

## 2018-06-06 MED ORDER — ONABOTULINUMTOXINA 100 UNITS IJ SOLR: INTRAMUSCULAR | 3 refills | Status: DC

## 2018-06-06 NOTE — Telephone Encounter (Signed)
Cindy Hoffman could you please send a new script to [facility]?

## 2018-06-06 NOTE — Telephone Encounter (Signed)
error 

## 2018-06-06 NOTE — Telephone Encounter (Signed)
Noted, thank you

## 2018-06-06 NOTE — Telephone Encounter (Signed)
I called to schedule delivery for the patient's medication. They need a new script for the Botox.   [name], would you please send the script to [place] [facility] [place]?

## 2018-06-06 NOTE — Telephone Encounter (Signed)
Cindy Hoffman from [facility] called needing a new RX for the pts BOTOX. Please advise.

## 2018-06-06 NOTE — Telephone Encounter (Signed)
This was already sent this morning

## 2018-06-06 NOTE — Telephone Encounter (Signed)
Order placed FYI

## 2018-06-07 ENCOUNTER — Encounter (INDEPENDENT_AMBULATORY_CARE_PROVIDER_SITE_OTHER): Payer: Self-pay | Admitting: Orthopaedic Surgery

## 2018-06-07 ENCOUNTER — Ambulatory Visit (INDEPENDENT_AMBULATORY_CARE_PROVIDER_SITE_OTHER): Payer: Medicaid Other | Admitting: Orthopaedic Surgery

## 2018-06-07 DIAGNOSIS — M25551 Pain in right hip: Secondary | ICD-10-CM

## 2018-06-07 NOTE — Progress Notes (Signed)
Office Visit Note   Patient: Cindy Hoffman           Date of Birth: 1976-03-13           MRN: [id number] Visit Date: 06/07/2018              Requested by: Cindy Harvey, NP Cindy Hoffman, Cindy Hoffman [id number] PCP: Cindy Harvey, NP   Assessment & Plan: Visit Diagnoses:  1. Pain of right hip joint     Plan: MR arthrogram of the right hip shows no osteoarthritis or degenerative joint disease.  There is a superior labral tear.  Due to this we will refer her to Dr. Victorino Hoffman at [facility] for consideration of hip arthroscopy.  Patient is in agreement.  She will follow-up with [place] as needed.  Follow-Up Instructions: Return if symptoms worsen or fail to improve.   Orders:  No orders of the defined types were placed in this encounter.  No orders of the defined types were placed in this encounter.     Procedures: No procedures performed   Clinical Data: No additional findings.   Subjective: Chief Complaint  Patient presents with  . Right Hip - Pain, Follow-up    Cindy Hoffman follows up today for her right hip MRI.  She states that she recently fell onto her right hip.  She continues to have groin pain and deep-seated right hip pain.   Review of Systems   Objective: Vital Signs: There were no vitals taken for this visit.  Physical Exam  Ortho Exam Right hip exam shows a bruise otherwise unremarkable. Specialty Comments:  No specialty comments available.  Imaging: No results found.   PMFS History: Patient Active Problem List   Diagnosis Date Noted  . Chronic low back pain 08/30/2017  . Chronic pain of both knees 08/30/2017  . Chronic migraine without aura, with intractable migraine, so stated, with status migrainosus 03/06/2017  . Intractable chronic migraine without aura and without status migrainosus 09/29/2016  . Occipital neuralgia 12/12/2015  . Thyroid nodule 07/19/2013  . Right thyroid nodule 06/18/2013  . Morbid obesity ([place])  05/01/2013  . Asthma, chronic 02/01/2013  . Smoker 02/01/2013   Past Medical History:  Diagnosis Date  . Anxiety   . Asthma   . Chronic pain syndrome   . Complication of anesthesia    "STATES WAKES UP AND CANT BREATHE AND NEEDS BREATHING TREATMENT  IN PACU"  . Dysrhythmia    palpitations- "from thyroid"  . Fibromyalgia   . Fracture of right foot   . Gastric ulcer 2018  . GERD (gastroesophageal reflux disease)   . Migraine   . Myofacial muscle pain   . Obesity   . Occipital neuralgia 2016  . OSA (obstructive sleep apnea)    Has CPAP  . Sleep apnea     NO CPAP--could not tolerates mask  . Thyroid disease   . Viral respiratory infection 06/26/13   ? influenza    Family History  Problem Relation Age of Onset  . Hypertension Mother   . Hypertension Father   . Prostate cancer Father   . Diabetes Sister   . Cancer Paternal Grandmother        breast    Past Surgical History:  Procedure Laterality Date  . ENDOMETRIAL ABLATION    . ENDOMETRIAL ABLATION  07/17/2017  . GALLBLADDER SURGERY  06/2016  . 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 NASAL SEPTUM SURGERY    .  THYROID LOBECTOMY Right 07/19/2013   Procedure: THYROID LOBECTOMY;  Surgeon: Harl Bowie, MD;  Location: [facility];  Service: General;  Laterality: Right;  . TUBAL LIGATION    . WISDOM TOOTH EXTRACTION     Social History   Occupational History  . Occupation: unemployed  Tobacco Use  . Smoking status: Former Smoker    Packs/day: 0.00    Years: 0.00    Pack years: 0.00    Types: Cigarettes    Last attempt to quit: 11/2015    Years since quitting: 2.6  . Smokeless tobacco: Never Used  Substance and Sexual Activity  . Alcohol use: No  . Drug use: No  . Sexual activity: Not on file

## 2018-06-09 ENCOUNTER — Ambulatory Visit: Payer: Medicaid Other | Admitting: Neurology

## 2018-06-09 ENCOUNTER — Other Ambulatory Visit (INDEPENDENT_AMBULATORY_CARE_PROVIDER_SITE_OTHER): Payer: Self-pay

## 2018-06-09 DIAGNOSIS — G43709 Chronic migraine without aura, not intractable, without status migrainosus: Secondary | ICD-10-CM

## 2018-06-09 DIAGNOSIS — M25551 Pain in right hip: Secondary | ICD-10-CM

## 2018-06-09 NOTE — Progress Notes (Signed)
Patient is here for follow up of intractable migraines. She was doing extremely well when she had botox but since discontinuation she has worsened (Last botox 08/2017). She has worsened., daily headaches which are mild, but 14 are migrainous and at least moderately severe and can be severe, last up to 24 hours. In the past she saw a >50% decrease with botox and we are restarting that treatment today. Will consider this her first botox since almost a year as lapsed, can take up to 3 injections for maximum efficacy. Is having PT and dry needling cervical muscles  which helps tremendously for occipital neuralgia. She loves the dry needling, it is helping the occipital neuralgia but still having occipital pain flares that can be severe 5 days a month. She has dry needling. + extra right occipital and spread out across the traps 4-5 places each. +all.          Consent Form Botulism Toxin Injection For Chronic Migraine  Botulism toxin has been approved by the Federal drug administration for treatment of chronic migraine. Botulism toxin does not cure chronic migraine and it may not be effective in some patients.  The administration of botulism toxin is accomplished by injecting a small amount of toxin into the muscles of the neck and head. Dosage must be titrated for each individual. Any benefits resulting from botulism toxin tend to wear off after 3 months with a repeat injection required if benefit is to be maintained. Injections are usually done every 3-4 months with maximum effect peak achieved by about 2 or 3 weeks. Botulism toxin is expensive and you should be sure of what costs you will incur resulting from the injection.  The side effects of botulism toxin use for chronic migraine may include:   -Transient, and usually mild, facial weakness with facial injections  -Transient, and usually mild, head or neck weakness with head/neck injections  -Reduction or loss of forehead facial animation  due to forehead muscle              weakness  -Eyelid drooping  -Dry eye  -Pain at the site of injection or bruising at the site of injection  -Double vision  -Potential unknown long term risks  Contraindications: You should not have Botox if you are pregnant, nursing, allergic to albumin, have an infection, skin condition, or muscle weakness at the site of the injection, or have myasthenia gravis, Lambert-Eaton syndrome, or ALS.  It is also possible that as with any injection, there may be an allergic reaction or no effect from the medication. Reduced effectiveness after repeated injections is sometimes seen and rarely infection at the injection site may occur. All care will be taken to prevent these side effects. If therapy is given over a long time, atrophy and wasting in the muscle injected may occur. Occasionally the patient's become refractory to treatment because they develop antibodies to the toxin. In this event, therapy needs to be modified.  I have read the above information and consent to the administration of botulism toxin.    ______________  _____   _________________  Patient signature     Date   Witness signature       BOTOX PROCEDURE NOTE FOR MIGRAINE HEADACHE    Contraindications and precautions discussed with patient(above). Aseptic procedure was observed and patient tolerated procedure. Procedure performed by Dr. Georgia Dom  The condition has existed for more than 6 months, and pt does not have a diagnosis of ALS, Myasthenia Gravis  or Lambert-Eaton Syndrome. Risks and benefits of injections discussed and pt agrees to proceed with the procedure. Written consent obtained  These injections are medically necessary. He receives good benefits from these injections. These injections do not cause sedations or hallucinations which the oral therapies may cause.  Indication/Diagnosis: chronic migraine BOTOX(J0585) injection was performed according to protocol by [employer].  200 units of BOTOX was dissolved into 4 cc NS.  NDC: [address]-44   Description of procedure:  The patient was placed in a sitting position. The standard protocol was used for Botox as follows, with 5 units of Botox injected at each site:   -Procerus muscle, midline injection  -Corrugator muscle, bilateral injection  -Frontalis muscle, bilateral injection, with 2 sites each side, medial injection was performed in the upper one third of the frontalis muscle, in the region vertical from the medial inferior edge of the superior orbital rim. The lateral injection was again in the upper one third of the forehead vertically above the lateral limbus of the cornea, 1.5 cm lateral to the medial injection site.  -Temporalis muscle injection, 4 sites, bilaterally. The first injection was 3 cm above the tragus of the ear, second injection site was 1.5 cm to 3 cm up from the first injection site in line with the tragus of the ear. The third injection site was 1.5-3 cm forward between the first 2 injection sites. The fourth injection site was 1.5 cm posterior to the second injection site.  -Occipitalis muscle injection, 3 sites, bilaterally. The first injection was done one half way between the occipital protuberance and the tip of the mastoid process behind the ear. The second injection site was done lateral and superior to the first, 1 fingerbreadth from the first injection. The third injection site was 1 fingerbreadth superiorly and medially from the first injection site.  -Cervical paraspinal muscle injection, 2 sites, bilateral knee first injection site was 1 cm from the midline of the cervical spine, 3 cm inferior to the lower border of the occipital protuberance. The second injection site was 1.5 cm superiorly and laterally to the first injection site.  -Trapezius muscle injection was performed at 3 sites, bilaterally. The first injection site was in the upper trapezius muscle halfway between the  inflection point of the neck, and the acromion. The second injection site was one half way between the acromion and the first injection site. The third injection was done between the first injection site and the inflection point of the neck.   Will return for repeat injection in 3 months.   A 200 unit sof Botox was used, 155 units were injected, the rest of the Botox was wasted. The patient tolerated the procedure well, there were no complications of the above procedure.

## 2018-06-09 NOTE — Progress Notes (Signed)
Botox- 100 units x 2 vials Lot: [id number] Expiration: 10/2020 NDC: 5271-2929-09  Bacteriostatic 0.9% Sodium Chloride- 38mL total Lot: [id number] Expiration: 02/01/2019 NDC: 9692-4932-41  Dx: [id number] S/P

## 2018-06-12 ENCOUNTER — Encounter: Payer: Medicaid Other | Admitting: Physical Therapy

## 2018-06-16 ENCOUNTER — Ambulatory Visit: Payer: Self-pay | Admitting: Family Medicine

## 2018-06-20 ENCOUNTER — Ambulatory Visit: Payer: Medicaid Other | Admitting: Physical Therapy

## 2018-06-23 ENCOUNTER — Ambulatory Visit: Payer: Self-pay | Admitting: Neurology

## 2018-06-27 ENCOUNTER — Ambulatory Visit: Payer: Medicaid Other | Admitting: Physical Therapy

## 2018-07-04 ENCOUNTER — Ambulatory Visit: Payer: Medicaid Other | Admitting: Physical Therapy

## 2018-08-22 ENCOUNTER — Other Ambulatory Visit: Payer: Self-pay | Admitting: Adult Health

## 2018-09-08 ENCOUNTER — Ambulatory Visit: Payer: Medicaid Other | Admitting: Neurology

## 2018-09-08 ENCOUNTER — Other Ambulatory Visit: Payer: Self-pay | Admitting: Neurology

## 2018-09-12 ENCOUNTER — Telehealth: Payer: Self-pay | Admitting: Neurology

## 2018-09-12 NOTE — Telephone Encounter (Signed)
I called the patient to rs botox apt she did not answer so I left a VM asking her to call back. Medication is here. DW

## 2018-09-12 NOTE — Telephone Encounter (Signed)
Pt returned call. Please call back at your earliest convenience.

## 2018-09-13 NOTE — Telephone Encounter (Signed)
I returned the patients call but she did not answer. I left a VM asking her to call back. DW

## 2018-09-21 ENCOUNTER — Telehealth: Payer: Self-pay

## 2018-09-21 NOTE — Telephone Encounter (Signed)
Patient is calling to follow up on her botox.

## 2018-09-26 NOTE — Telephone Encounter (Signed)
I called the patient back but she did not answer so I left a VM asking her to return my call. If she calls back please make her aware that Dr. Jaynee Eagles is encouraging patients to see our NP for the quickest turn around time for getting back on the botox schedule. I have made her an apt with Amy Lomax for next week, please confirm this apt works. If it does not please let me know and I can schedule her for mid June. DW

## 2018-09-27 NOTE — Telephone Encounter (Signed)
Pt has called back, she was made aware of the message from [facility].  Pt will keep the appointment date and time with NP Amy, she has no questions or concerns

## 2018-09-28 NOTE — Telephone Encounter (Signed)
Noted, thank you. DW  °

## 2018-10-01 ENCOUNTER — Other Ambulatory Visit: Payer: Self-pay | Admitting: Neurology

## 2018-10-03 ENCOUNTER — Ambulatory Visit: Payer: Medicaid Other | Admitting: Family Medicine

## 2018-10-23 ENCOUNTER — Telehealth: Payer: Self-pay | Admitting: Neurology

## 2018-10-23 NOTE — Telephone Encounter (Signed)
Pt called wanting to schedule her next BOTOX appt with Dr. Jaynee Eagles. Please advise.

## 2018-10-23 NOTE — Telephone Encounter (Signed)
I called and scheduled the patient for her next injection.  °

## 2018-11-02 ENCOUNTER — Ambulatory Visit: Payer: Medicaid Other | Admitting: Neurology

## 2018-11-02 ENCOUNTER — Other Ambulatory Visit: Payer: Self-pay

## 2018-11-02 DIAGNOSIS — G43709 Chronic migraine without aura, not intractable, without status migrainosus: Secondary | ICD-10-CM | POA: Diagnosis not present

## 2018-11-02 DIAGNOSIS — M7918 Myalgia, other site: Secondary | ICD-10-CM

## 2018-11-02 NOTE — Progress Notes (Signed)
Botox- 100 units x 2 vials Lot: [id number] Expiration: 01/2021 NDC: 8315-1761-60  Bacteriostatic 0.9% Sodium Chloride- 73mL total Lot: [id number] Expiration: 02/01/2019 NDC: 2694-8546-27  Dx: [id number] S/P

## 2018-11-02 NOTE — Progress Notes (Signed)
Patient is here for follow up of intractable migraines. She was doing extremely well when she had botox but since discontinuation due to covid she has worsened (Last botox 06/2017). Basline daily headaches which are mild, but 14 are migrainous and at least moderately severe and can be severe, last up to 24 hours. In the past she saw a >50% decrease with botox and we are restarting that treatment today. Will consider this her first botox since almost 6 months has lapsed, can take up to 3 injections for maximum efficacy. Was having PT and dry needling cervical muscles  which helps tremendously for occipital neuralgia. She loves the dry needling, it is helping the occipital neuralgia but still having occipital pain flares that can be severe 5 days a month. She needs a new referral for dry needling for worsening neck pain. + extra right occipital and spread out across the traps 4-5 places each. +all.   Orders Placed This Encounter  Procedures  . Ambulatory referral to Physical Therapy     Consent Form Botulism Toxin Injection For Chronic Migraine  Botulism toxin has been approved by the Federal drug administration for treatment of chronic migraine. Botulism toxin does not cure chronic migraine and it may not be effective in some patients.  The administration of botulism toxin is accomplished by injecting a small amount of toxin into the muscles of the neck and head. Dosage must be titrated for each individual. Any benefits resulting from botulism toxin tend to wear off after 3 months with a repeat injection required if benefit is to be maintained. Injections are usually done every 3-4 months with maximum effect peak achieved by about 2 or 3 weeks. Botulism toxin is expensive and you should be sure of what costs you will incur resulting from the injection.  The side effects of botulism toxin use for chronic migraine may include:   -Transient, and usually mild, facial weakness with facial injections   -Transient, and usually mild, head or neck weakness with head/neck injections  -Reduction or loss of forehead facial animation due to forehead muscle              weakness  -Eyelid drooping  -Dry eye  -Pain at the site of injection or bruising at the site of injection  -Double vision  -Potential unknown long term risks  Contraindications: You should not have Botox if you are pregnant, nursing, allergic to albumin, have an infection, skin condition, or muscle weakness at the site of the injection, or have myasthenia gravis, Lambert-Eaton syndrome, or ALS.  It is also possible that as with any injection, there may be an allergic reaction or no effect from the medication. Reduced effectiveness after repeated injections is sometimes seen and rarely infection at the injection site may occur. All care will be taken to prevent these side effects. If therapy is given over a long time, atrophy and wasting in the muscle injected may occur. Occasionally the patient's become refractory to treatment because they develop antibodies to the toxin. In this event, therapy needs to be modified.  I have read the above information and consent to the administration of botulism toxin.    ______________  _____   _________________  Patient signature     Date   Witness signature       BOTOX PROCEDURE NOTE FOR MIGRAINE HEADACHE    Contraindications and precautions discussed with patient(above). Aseptic procedure was observed and patient tolerated procedure. Procedure performed by Dr. Georgia Dom  The  condition has existed for more than 6 months, and pt does not have a diagnosis of ALS, Myasthenia Gravis or Lambert-Eaton Syndrome. Risks and benefits of injections discussed and pt agrees to proceed with the procedure. Written consent obtained  These injections are medically necessary. He receives good benefits from these injections. These injections do not cause sedations or hallucinations which the oral  therapies may cause.  Indication/Diagnosis: chronic migraine BOTOX(J0585) injection was performed according to protocol by [employer]er[employer]. 200 units of BOTOX was dissolved into 4 cc NS.  NDC: [address]-64   Description of procedure:  The patient was placed in a sitting position. The standard protocol was used for Botox as follows, with 5 units of Botox injected at each site:   -Procerus muscle, midline injection  -Corrugator muscle, bilateral injection  -Frontalis muscle, bilateral injection, with 2 sites each side, medial injection was performed in the upper one third of the frontalis muscle, in the region vertical from the medial inferior edge of the superior orbital rim. The lateral injection was again in the upper one third of the forehead vertically above the lateral limbus of the cornea, 1.5 cm lateral to the medial injection site.  -Temporalis muscle injection, 4 sites, bilaterally. The first injection was 3 cm above the tragus of the ear, second injection site was 1.5 cm to 3 cm up from the first injection site in line with the tragus of the ear. The third injection site was 1.5-3 cm forward between the first 2 injection sites. The fourth injection site was 1.5 cm posterior to the second injection site.  -Occipitalis muscle injection, 3 sites, bilaterally. The first injection was done one half way between the occipital protuberance and the tip of the mastoid process behind the ear. The second injection site was done lateral and superior to the first, 1 fingerbreadth from the first injection. The third injection site was 1 fingerbreadth superiorly and medially from the first injection site.  -Cervical paraspinal muscle injection, 2 sites, bilateral knee first injection site was 1 cm from the midline of the cervical spine, 3 cm inferior to the lower border of the occipital protuberance. The second injection site was 1.5 cm superiorly and laterally to the first injection site.  -Trapezius  muscle injection was performed at 3 sites, bilaterally. The first injection site was in the upper trapezius muscle halfway between the inflection point of the neck, and the acromion. The second injection site was one half way between the acromion and the first injection site. The third injection was done between the first injection site and the inflection point of the neck.   Will return for repeat injection in 3 months.   A 200 unit sof Botox was used, 155 units were injected, the rest of the Botox was wasted. The patient tolerated the procedure well, there were no complications of the above procedure.

## 2018-11-23 ENCOUNTER — Ambulatory Visit: Payer: Medicare Other | Attending: Neurology | Admitting: Physical Therapy

## 2019-02-05 NOTE — Progress Notes (Deleted)
Patient is here for follow up of intractable migraines. She was doing extremely well when she had botox but since discontinuation due to covid she has worsened (Last botox 06/2017). Basline daily headaches which are mild, but 14 are migrainous and at least moderately severe and can be severe, last up to 24 hours. In the past she saw a >50% decrease with botox and we are restarting that treatment today. Will consider this her first botox since almost 6 months has lapsed, can take up to 3 injections for maximum efficacy. Was having PT and dry needling cervical muscles  which helps tremendously for occipital neuralgia. She loves the dry needling, it is helping the occipital neuralgia but still having occipital pain flares that can be severe 5 days a month. She needs a new referral for dry needling for worsening neck pain. +a.   No orders of the defined types were placed in this encounter.    Consent Form Botulism Toxin Injection For Chronic Migraine  Botulism toxin has been approved by the Federal drug administration for treatment of chronic migraine. Botulism toxin does not cure chronic migraine and it may not be effective in some patients.  The administration of botulism toxin is accomplished by injecting a small amount of toxin into the muscles of the neck and head. Dosage must be titrated for each individual. Any benefits resulting from botulism toxin tend to wear off after 3 months with a repeat injection required if benefit is to be maintained. Injections are usually done every 3-4 months with maximum effect peak achieved by about 2 or 3 weeks. Botulism toxin is expensive and you should be sure of what costs you will incur resulting from the injection.  The side effects of botulism toxin use for chronic migraine may include:   -Transient, and usually mild, facial weakness with facial injections  -Transient, and usually mild, head or neck weakness with head/neck injections  -Reduction or loss  of forehead facial animation due to forehead muscle              weakness  -Eyelid drooping  -Dry eye  -Pain at the site of injection or bruising at the site of injection  -Double vision  -Potential unknown long term risks  Contraindications: You should not have Botox if you are pregnant, nursing, allergic to albumin, have an infection, skin condition, or muscle weakness at the site of the injection, or have myasthenia gravis, Lambert-Eaton syndrome, or ALS.  It is also possible that as with any injection, there may be an allergic reaction or no effect from the medication. Reduced effectiveness after repeated injections is sometimes seen and rarely infection at the injection site may occur. All care will be taken to prevent these side effects. If therapy is given over a long time, atrophy and wasting in the muscle injected may occur. Occasionally the patient's become refractory to treatment because they develop antibodies to the toxin. In this event, therapy needs to be modified.  I have read the above information and consent to the administration of botulism toxin.    ______________  _____   _________________  Patient signature     Date   Witness signature       BOTOX PROCEDURE NOTE FOR MIGRAINE HEADACHE    Contraindications and precautions discussed with patient(above). Aseptic procedure was observed and patient tolerated procedure. Procedure performed by Dr. Georgia Dom  The condition has existed for more than 6 months, and pt does not have a diagnosis of  ALS, Myasthenia Gravis or Lambert-Eaton Syndrome. Risks and benefits of injections discussed and pt agrees to proceed with the procedure. Written consent obtained  These injections are medically necessary. He receives good benefits from these injections. These injections do not cause sedations or hallucinations which the oral therapies may cause.  Indication/Diagnosis: chronic migraine BOTOX(J0585) injection was performed  according to protocol by [employer]er[employer]. 200 units of BOTOX was dissolved into 4 cc NS.  NDC: [id number]   Description of procedure:  The patient was placed in a sitting position. The standard protocol was used for Botox as follows, with 5 units of Botox injected at each site:   -Procerus muscle, midline injection  -Corrugator muscle, bilateral injection  -Frontalis muscle, bilateral injection, with 2 sites each side, medial injection was performed in the upper one third of the frontalis muscle, in the region vertical from the medial inferior edge of the superior orbital rim. The lateral injection was again in the upper one third of the forehead vertically above the lateral limbus of the cornea, 1.5 cm lateral to the medial injection site.  -Temporalis muscle injection, 4 sites, bilaterally. The first injection was 3 cm above the tragus of the ear, second injection site was 1.5 cm to 3 cm up from the first injection site in line with the tragus of the ear. The third injection site was 1.5-3 cm forward between the first 2 injection sites. The fourth injection site was 1.5 cm posterior to the second injection site.  -Occipitalis muscle injection, 3 sites, bilaterally. The first injection was done one half way between the occipital protuberance and the tip of the mastoid process behind the ear. The second injection site was done lateral and superior to the first, 1 fingerbreadth from the first injection. The third injection site was 1 fingerbreadth superiorly and medially from the first injection site.  -Cervical paraspinal muscle injection, 2 sites, bilateral knee first injection site was 1 cm from the midline of the cervical spine, 3 cm inferior to the lower border of the occipital protuberance. The second injection site was 1.5 cm superiorly and laterally to the first injection site.  -Trapezius muscle injection was performed at 3 sites, bilaterally. The first injection site was in the upper  trapezius muscle halfway between the inflection point of the neck, and the acromion. The second injection site was one half way between the acromion and the first injection site. The third injection was done between the first injection site and the inflection point of the neck.   Will return for repeat injection in 3 months.   A 200 unit sof Botox was used, 155 units were injected, the rest of the Botox was wasted. The patient tolerated the procedure well, there were no complications of the above procedure.

## 2019-02-06 ENCOUNTER — Ambulatory Visit: Payer: Medicaid Other | Admitting: Neurology

## 2019-02-07 ENCOUNTER — Telehealth: Payer: Self-pay | Admitting: Adult Health

## 2019-02-07 ENCOUNTER — Encounter: Payer: Self-pay | Admitting: Neurology

## 2019-02-07 NOTE — Telephone Encounter (Signed)
I called and scheduled the patient. DW  °

## 2019-02-07 NOTE — Telephone Encounter (Signed)
Pt is asking for a call to discuss r/s her Botox appointment

## 2019-02-08 ENCOUNTER — Telehealth: Payer: Self-pay | Admitting: *Deleted

## 2019-02-08 NOTE — Telephone Encounter (Signed)
Faxed emgality approval letter to [facility] on [address]. Received a receipt of confirmation.

## 2019-02-08 NOTE — Telephone Encounter (Signed)
PA Case: [id number], Status: Approved, Coverage Starts on: 05/03/2018 12:00:00 AM, Coverage Ends on: 05/02/2020 12:00:00 AM. Questions? Contact [phone].

## 2019-02-08 NOTE — Telephone Encounter (Signed)
Submitted PA Emgality on CMM. Key: [id number]. Rx #: [id number]. Waiting on determination from [facility].

## 2019-02-20 ENCOUNTER — Ambulatory Visit: Payer: Medicare Other | Admitting: Neurology

## 2019-02-20 ENCOUNTER — Telehealth: Payer: Self-pay

## 2019-02-20 NOTE — Telephone Encounter (Signed)
I called the patient to make her aware Dr. Jaynee Eagles will be out of office this afternoon and ask the patient if shed be ok seeing the NP or would prefer to rs. She did not answer so I left a VM asking her to call me back.

## 2019-02-27 ENCOUNTER — Ambulatory Visit (INDEPENDENT_AMBULATORY_CARE_PROVIDER_SITE_OTHER): Payer: Medicare Other | Admitting: Neurology

## 2019-02-27 ENCOUNTER — Other Ambulatory Visit: Payer: Self-pay

## 2019-02-27 VITALS — Temp 97.8°F

## 2019-02-27 DIAGNOSIS — G43709 Chronic migraine without aura, not intractable, without status migrainosus: Secondary | ICD-10-CM

## 2019-02-27 MED ORDER — KETOROLAC TROMETHAMINE 60 MG/2ML IM SOLN
60.0000 mg | Freq: Once | INTRAMUSCULAR | Status: AC
  Administered 2019-02-27: 60 mg via INTRAMUSCULAR

## 2019-02-27 NOTE — Progress Notes (Signed)
Botox- 100 units x 2 vials Lot: [id number] Expiration: 07/2021 NDC: [id number]  Bacteriostatic 0.9% Sodium Chloride- 6mL total Lot: [id number] Expiration: 11/01/2019 NDC: [id number]  Dx: [id number] S/P  Toradol 60 mg IM injection given x 1 per v.o. Dr. Jaynee Eagles. Pt tolerated well. Administered in RUOQ of R buttock. Slight bleeding resolved quickly. Bandaid applied. See MAR.

## 2019-02-27 NOTE — Progress Notes (Signed)
02/27/2019: Patient is here for follow up of intractable migraines. Basline daily headaches which are mild, but 14 are migrainous and at least moderately severe and can be severe, last up to 24 hours. Today she reports a >50% decrease with botox and we are restarting that treatment today. as having PT and dry needling cervical muscles  which helps tremendously for occipital neuralgia. She loves the dry needling, it is helping the occipital neuralgia but still having occipital pain flares that can be severe 5 days a month. She needs a new referral for dry needling for worsening neck pain, she will email [place] when she would like it . +all.    Consent Form Botulism Toxin Injection For Chronic Migraine    Reviewed orally with patient, additionally signature is on file:  Botulism toxin has been approved by the Federal drug administration for treatment of chronic migraine. Botulism toxin does not cure chronic migraine and it may not be effective in some patients.  The administration of botulism toxin is accomplished by injecting a small amount of toxin into the muscles of the neck and head. Dosage must be titrated for each individual. Any benefits resulting from botulism toxin tend to wear off after 3 months with a repeat injection required if benefit is to be maintained. Injections are usually done every 3-4 months with maximum effect peak achieved by about 2 or 3 weeks. Botulism toxin is expensive and you should be sure of what costs you will incur resulting from the injection.  The side effects of botulism toxin use for chronic migraine may include:   -Transient, and usually mild, facial weakness with facial injections  -Transient, and usually mild, head or neck weakness with head/neck injections  -Reduction or loss of forehead facial animation due to forehead muscle weakness  -Eyelid drooping  -Dry eye  -Pain at the site of injection or bruising at the site of injection  -Double vision  -Potential  unknown long term risks  Contraindications: You should not have Botox if you are pregnant, nursing, allergic to albumin, have an infection, skin condition, or muscle weakness at the site of the injection, or have myasthenia gravis, Lambert-Eaton syndrome, or ALS.  It is also possible that as with any injection, there may be an allergic reaction or no effect from the medication. Reduced effectiveness after repeated injections is sometimes seen and rarely infection at the injection site may occur. All care will be taken to prevent these side effects. If therapy is given over a long time, atrophy and wasting in the muscle injected may occur. Occasionally the patient's become refractory to treatment because they develop antibodies to the toxin. In this event, therapy needs to be modified.  I have read the above information and consent to the administration of botulism toxin.    BOTOX PROCEDURE NOTE FOR MIGRAINE HEADACHE    Contraindications and precautions discussed with patient(above). Aseptic procedure was observed and patient tolerated procedure. Procedure performed by Dr. Georgia Dom  The condition has existed for more than 6 months, and pt does not have a diagnosis of ALS, Myasthenia Gravis or Lambert-Eaton Syndrome.  Risks and benefits of injections discussed and pt agrees to proceed with the procedure.  Written consent obtained  These injections are medically necessary. Pt  receives good benefits from these injections. These injections do not cause sedations or hallucinations which the oral therapies may cause.  Description of procedure:  The patient was placed in a sitting position. The standard protocol was used for Botox  as follows, with 5 units of Botox injected at each site:   -Procerus muscle, midline injection  -Corrugator muscle, bilateral injection  -Frontalis muscle, bilateral injection, with 2 sites each side, medial injection was performed in the upper one third of the  frontalis muscle, in the region vertical from the medial inferior edge of the superior orbital rim. The lateral injection was again in the upper one third of the forehead vertically above the lateral limbus of the cornea, 1.5 cm lateral to the medial injection site.  -Temporalis muscle injection, 4 sites, bilaterally. The first injection was 3 cm above the tragus of the ear, second injection site was 1.5 cm to 3 cm up from the first injection site in line with the tragus of the ear. The third injection site was 1.5-3 cm forward between the first 2 injection sites. The fourth injection site was 1.5 cm posterior to the second injection site.   -Occipitalis muscle injection, 3 sites, bilaterally. The first injection was done one half way between the occipital protuberance and the tip of the mastoid process behind the ear. The second injection site was done lateral and superior to the first, 1 fingerbreadth from the first injection. The third injection site was 1 fingerbreadth superiorly and medially from the first injection site.  -Cervical paraspinal muscle injection, 2 sites, bilateral knee first injection site was 1 cm from the midline of the cervical spine, 3 cm inferior to the lower border of the occipital protuberance. The second injection site was 1.5 cm superiorly and laterally to the first injection site.  -Trapezius muscle injection was performed at 3 sites, bilaterally. The first injection site was in the upper trapezius muscle halfway between the inflection point of the neck, and the acromion. The second injection site was one half way between the acromion and the first injection site. The third injection was done between the first injection site and the inflection point of the neck.   Will return for repeat injection in 3 months.   200 units of Botox was used, any Botox not injected was wasted. The patient tolerated the procedure well, there were no complications of the above  procedure.

## 2019-03-19 ENCOUNTER — Telehealth: Payer: Self-pay | Admitting: Neurology

## 2019-03-19 NOTE — Telephone Encounter (Signed)
Returned pt's call. She could not recall what was mentioned. She said Dr. Jaynee Eagles had told her she could come and try something for a migraine (?infusion?injection- pt couldn't remember). She said her migraine had eased off since she called though. She took Phenergan and Tizanidine. I encouraged pt to call back if needed. She verbalized appreciation.

## 2019-03-19 NOTE — Telephone Encounter (Signed)
Pt called wanting to speak to RN about other Migraine treatment that was offered to her. Please advise.

## 2019-04-16 ENCOUNTER — Other Ambulatory Visit: Payer: Self-pay | Admitting: Neurology

## 2019-04-16 MED ORDER — BOTOX 100 UNITS IJ SOLR: INTRAMUSCULAR | 0 refills | Status: DC

## 2019-04-16 NOTE — Telephone Encounter (Signed)
Alesha from [facility] called wanting to put in a refill request for the pt's botulinum toxin Type A (BOTOX) 100 units SOLR injection Please advise.  [phone]

## 2019-04-16 NOTE — Telephone Encounter (Signed)
[facility]ill sent to [facility].

## 2019-04-16 NOTE — Telephone Encounter (Signed)
Noted, thank you. DW  °

## 2019-04-17 ENCOUNTER — Ambulatory Visit: Payer: Medicaid Other | Admitting: Adult Health

## 2019-05-03 NOTE — Pre-Procedure Instructions (Addendum)
Cindy Hoffman  05/03/2019    Your procedure is scheduled on January 7..  Report to [facility], Main Entrance or Entrance "A" at 5:30 AM  A.M.  Call this number if you have problems the morning of surgery: [phone]  This is the number for the Pre- Surgical Desk.                For any other questions, please call [phone], Monday - Friday 8 AM - 4 PM.   Remember:  Do not eat after midnight Wednesday, January 6.  You may drink clear liquids until  4:30 AM.   Clear liquids allowed are:                     Water, Juice (non-citric and without pulp), Carbonated beverages, Clear Tea, Black Coffee only, Plain Jell-O only, Gatorade and Plain Popsicles only Drink the Pre- SUrgery Ensure between 4:00 AM and 4:30 AM   Take these medicines the morning of surgery with A SIP OF WATER:              escitalopram (LEXAPRO)               esomeprazole (NEXIUM)              gabapentin (NEURONTIN)              mometasone-formoterol (DULERA) inhaler              oxyCODONE ER (XTAMPZA ER)     Take if needed: dicyclomine (BENTYL) hydrOXYzine (ATARAX/VISTARIL)  LORazepam (ATIVAN) ondansetron (ZOFRAN-ODT) or promethazine (PHENERGAN) Oxycodone tiZANidine (ZANAFLEX)  Albuterol inhaler or nebulizer-; bring Albuterol inhaler with you.   STOP taking Aspirin, Aspirin Products (Goody Powder, Excedrin Migraine), Ibuprofen (Advil), Naproxen (Aleve), Vitamins and Herbal Products (ie Fish Oil).  Special instructions:   [place]- Preparing For Surgery  Before surgery, you can play an important role. Because skin is not sterile, your skin needs to be as free of germs as possible. You can reduce the number of germs on your skin by washing with CHG (chlorahexidine gluconate) Soap before surgery.  CHG is an antiseptic cleaner which kills germs and bonds with the skin to continue killing germs even after washing.    Oral Hygiene is also important to reduce your risk of infection.  Remember -  BRUSH YOUR TEETH THE MORNING OF SURGERY WITH YOUR REGULAR TOOTHPASTE  Please do not use if you have an allergy to CHG or antibacterial soaps. If your skin becomes reddened/irritated stop using the CHG.  Do not shave (including legs and underarms) for at least 48 hours prior to first CHG shower. It is OK to shave your face.  Please follow these instructions carefully.   1. Shower the NIGHT BEFORE SURGERY and the MORNING OF SURGERY with CHG.   2. If you chose to wash your hair, wash your hair first as usual with your normal shampoo.  3. After you shampoo, wash your face and private area with the soap you use at home, then rinse your hair and body thoroughly to remove the shampoo and soap.  4. Use CHG as you would any other liquid soap. You can apply CHG directly to the skin and wash gently with a scrungie or a clean washcloth.   5. Apply the CHG Soap to your body ONLY FROM THE NECK DOWN.  Do not use on open wounds or open sores. Avoid contact with your eyes, ears, mouth  and genitals (private parts).   6. Wash thoroughly, paying special attention to the area where your surgery will be performed.  7. Thoroughly rinse your body with warm water from the neck down.  8. DO NOT shower/wash with your normal soap after using and rinsing off the CHG Soap.  9. Pat yourself dry with a CLEAN TOWEL.  10. Wear CLEAN PAJAMAS to bed the night before surgery, wear comfortable clothes the morning of surgery  11. Place CLEAN SHEETS on your bed the night of your first shower and DO NOT SLEEP WITH PETS.  Day of Surgery: Shower as instructed above. Do not wear lotions, powders, or perfumes, or deodorant. Please wear clean clothes to the hospital/surgery center.   Remember to brush your teeth WITH YOUR REGULAR TOOTHPASTE.             Do not wear jewelry, make-up or nail polish.  Do not wear lotions, powders, or perfumes, or deodorant.  Do not shave 48 hours prior to surgery.    Do not bring valuables to  the hospital.  [facility] is not responsible for any belongings or valuables.  Contacts, dentures or bridgework may not be worn into surgery.  Leave your suitcase in the car.  After surgery it may be brought to your room.  For patients admitted to the hospital, discharge time will be determined by your treatment team.  Patients discharged the day of surgery will not be allowed to drive home.   Please read over the following fact sheets that you were given: Pain Booklet,Coughing and Deep Breathing , Surgical Site Infections.

## 2019-05-07 ENCOUNTER — Encounter (HOSPITAL_COMMUNITY)
Admission: RE | Admit: 2019-05-07 | Discharge: 2019-05-07 | Disposition: A | Discharge status: Home / Self Care | Payer: Medicare Other | Source: Ambulatory Visit | Attending: Orthopedic Surgery | Admitting: Orthopedic Surgery

## 2019-05-07 ENCOUNTER — Other Ambulatory Visit (HOSPITAL_COMMUNITY)
Admission: RE | Admit: 2019-05-07 | Discharge: 2019-05-07 | Disposition: A | Discharge status: Home / Self Care | Payer: Medicare Other | Source: Ambulatory Visit | Attending: Orthopedic Surgery | Admitting: Orthopedic Surgery

## 2019-05-07 ENCOUNTER — Encounter (HOSPITAL_COMMUNITY): Payer: Self-pay

## 2019-05-07 ENCOUNTER — Other Ambulatory Visit: Payer: Self-pay

## 2019-05-07 DIAGNOSIS — Z01812 Encounter for preprocedural laboratory examination: Secondary | ICD-10-CM | POA: Insufficient documentation

## 2019-05-07 DIAGNOSIS — Z20822 Contact with and (suspected) exposure to covid-19: Secondary | ICD-10-CM | POA: Diagnosis not present

## 2019-05-07 DIAGNOSIS — Z01818 Encounter for other preprocedural examination: Secondary | ICD-10-CM | POA: Diagnosis present

## 2019-05-07 DIAGNOSIS — Z01810 Encounter for preprocedural cardiovascular examination: Secondary | ICD-10-CM | POA: Insufficient documentation

## 2019-05-07 HISTORY — DX: Adverse effect of other opioids, initial encounter: T40.2X5A

## 2019-05-07 HISTORY — DX: Adverse effect of other opioids, initial encounter: K59.03

## 2019-05-07 HISTORY — DX: Personal history of urinary calculi: Z87.442

## 2019-05-07 HISTORY — DX: Unspecified osteoarthritis, unspecified site: M19.90

## 2019-05-07 LAB — CBC
HCT: 43 % (ref 36.0–46.0)
Hemoglobin: 13.5 g/dL (ref 12.0–15.0)
MCH: 28.9 pg (ref 26.0–34.0)
MCHC: 31.4 g/dL (ref 30.0–36.0)
MCV: 92.1 fL (ref 80.0–100.0)
Platelets: 316 10*3/uL (ref 150–400)
RBC: 4.67 MIL/uL (ref 3.87–5.11)
RDW: 17.4 % — ABNORMAL HIGH (ref 11.5–15.5)
WBC: 8.3 10*3/uL (ref 4.0–10.5)
nRBC: 0 % (ref 0.0–0.2)

## 2019-05-07 LAB — BASIC METABOLIC PANEL
Anion gap: 8 (ref 5–15)
BUN: 8 mg/dL (ref 6–20)
CO2: 26 mmol/L (ref 22–32)
Calcium: 8.5 mg/dL — ABNORMAL LOW (ref 8.9–10.3)
Chloride: 103 mmol/L (ref 98–111)
Creatinine, Ser: 0.71 mg/dL (ref 0.44–1.00)
GFR calc Af Amer: >60 mL/min (ref >60)
GFR calc non Af Amer: >60 mL/min (ref >60)
Glucose, Bld: 95 mg/dL (ref 70–99)
Potassium: 3.7 mmol/L (ref 3.5–5.1)
Sodium: 137 mmol/L (ref 135–145)

## 2019-05-07 LAB — SURGICAL PCR SCREEN
MRSA, PCR: NEGATIVE
Staphylococcus aureus: NEGATIVE

## 2019-05-07 NOTE — Progress Notes (Signed)
PCP - Dossie Arbour, NP  Cardiologist - none  Chest x-ray -   EKG - 05/07/2019  Stress Test - no  ECHO - no  Cardiac Cath - no  Sleep Study - years CPAP - prior to losing 200lbs  LABS-CBC, BMP  ASA-na  ERAS-yes,clears until 0430. PatienT dRinks water only. I gave [name] -  Ensure- Pre- Surgery and instructions  HA1C-na Fasting Blood Sugar - na Checks Blood Sugar __0___ times a day  Anesthesia-  Pt denies having chest pain, sob, or fever at this time. All instructions explained to the pt, with a verbal understanding of the material. Pt agrees to go over the instructions while at home for a better understanding. Pt also instructed to self quarantine after being tested for COVID-19. The opportunity to ask questions was provided.

## 2019-05-08 LAB — NOVEL CORONAVIRUS, NAA (HOSP ORDER, SEND-OUT TO REF LAB; TAT 18-24 HRS): SARS-CoV-2, NAA: NOT DETECTED

## 2019-05-10 ENCOUNTER — Encounter (HOSPITAL_COMMUNITY)
Admission: RE | Disposition: A | Discharge status: Home / Self Care | Payer: Self-pay | Source: Home / Self Care | Attending: Orthopedic Surgery

## 2019-05-10 ENCOUNTER — Ambulatory Visit
Admit: 2019-05-10 | Discharge: 2019-05-10 | Disposition: A | Discharge status: Home / Self Care | Payer: Medicare Other | Source: Ambulatory Visit | Attending: Radiation Oncology | Admitting: Radiation Oncology

## 2019-05-10 ENCOUNTER — Ambulatory Visit (HOSPITAL_COMMUNITY)
Admission: RE | Admit: 2019-05-10 | Discharge: 2019-05-10 | Disposition: A | Discharge status: Home / Self Care | Payer: Medicare Other | Attending: Orthopedic Surgery | Admitting: Orthopedic Surgery

## 2019-05-10 ENCOUNTER — Other Ambulatory Visit: Payer: Self-pay

## 2019-05-10 ENCOUNTER — Encounter (HOSPITAL_COMMUNITY): Payer: Self-pay | Admitting: Orthopedic Surgery

## 2019-05-10 ENCOUNTER — Ambulatory Visit (HOSPITAL_COMMUNITY): Payer: Medicare Other | Admitting: Anesthesiology

## 2019-05-10 ENCOUNTER — Ambulatory Visit (HOSPITAL_COMMUNITY): Payer: Medicare Other

## 2019-05-10 DIAGNOSIS — Z9884 Bariatric surgery status: Secondary | ICD-10-CM | POA: Insufficient documentation

## 2019-05-10 DIAGNOSIS — G894 Chronic pain syndrome: Secondary | ICD-10-CM | POA: Insufficient documentation

## 2019-05-10 DIAGNOSIS — J45909 Unspecified asthma, uncomplicated: Secondary | ICD-10-CM | POA: Diagnosis not present

## 2019-05-10 DIAGNOSIS — F419 Anxiety disorder, unspecified: Secondary | ICD-10-CM | POA: Insufficient documentation

## 2019-05-10 DIAGNOSIS — S73101A Unspecified sprain of right hip, initial encounter: Secondary | ICD-10-CM | POA: Diagnosis not present

## 2019-05-10 DIAGNOSIS — S32491A Other specified fracture of right acetabulum, initial encounter for closed fracture: Secondary | ICD-10-CM | POA: Insufficient documentation

## 2019-05-10 DIAGNOSIS — G4730 Sleep apnea, unspecified: Secondary | ICD-10-CM | POA: Diagnosis not present

## 2019-05-10 DIAGNOSIS — M797 Fibromyalgia: Secondary | ICD-10-CM | POA: Insufficient documentation

## 2019-05-10 DIAGNOSIS — Z6831 Body mass index (BMI) 31.0-31.9, adult: Secondary | ICD-10-CM | POA: Diagnosis not present

## 2019-05-10 DIAGNOSIS — S73191A Other sprain of right hip, initial encounter: Secondary | ICD-10-CM

## 2019-05-10 DIAGNOSIS — Z79899 Other long term (current) drug therapy: Secondary | ICD-10-CM | POA: Diagnosis not present

## 2019-05-10 DIAGNOSIS — G4733 Obstructive sleep apnea (adult) (pediatric): Secondary | ICD-10-CM | POA: Diagnosis not present

## 2019-05-10 DIAGNOSIS — Z419 Encounter for procedure for purposes other than remedying health state, unspecified: Secondary | ICD-10-CM

## 2019-05-10 DIAGNOSIS — E669 Obesity, unspecified: Secondary | ICD-10-CM | POA: Insufficient documentation

## 2019-05-10 DIAGNOSIS — X58XXXA Exposure to other specified factors, initial encounter: Secondary | ICD-10-CM | POA: Insufficient documentation

## 2019-05-10 DIAGNOSIS — K219 Gastro-esophageal reflux disease without esophagitis: Secondary | ICD-10-CM | POA: Diagnosis not present

## 2019-05-10 DIAGNOSIS — S73199A Other sprain of unspecified hip, initial encounter: Secondary | ICD-10-CM | POA: Insufficient documentation

## 2019-05-10 DIAGNOSIS — M25851 Other specified joint disorders, right hip: Secondary | ICD-10-CM | POA: Diagnosis present

## 2019-05-10 DIAGNOSIS — Z510 Encounter for antineoplastic radiation therapy: Secondary | ICD-10-CM | POA: Insufficient documentation

## 2019-05-10 DIAGNOSIS — Z87891 Personal history of nicotine dependence: Secondary | ICD-10-CM | POA: Diagnosis not present

## 2019-05-10 HISTORY — PX: HIP ARTHROSCOPY: SHX668

## 2019-05-10 LAB — POCT PREGNANCY, URINE: Preg Test, Ur: NEGATIVE

## 2019-05-10 SURGERY — ARTHROSCOPY HIP
Anesthesia: General | Site: Hip | Laterality: Right

## 2019-05-10 MED ORDER — MIDAZOLAM HCL 2 MG/2ML IJ SOLN
INTRAMUSCULAR | Status: AC
  Filled 2019-05-10: qty 2

## 2019-05-10 MED ORDER — BUPIVACAINE-EPINEPHRINE 0.25% -1:200000 IJ SOLN
INTRAMUSCULAR | Status: DC | PRN
  Administered 2019-05-10: 20 mL

## 2019-05-10 MED ORDER — METHOCARBAMOL 500 MG PO TABS: 500.0000 mg | ORAL_TABLET | Freq: Four times a day (QID) | ORAL | 0 refills | Status: DC | PRN

## 2019-05-10 MED ORDER — DOXYCYCLINE HYCLATE 20 MG PO TABS: 20.0000 mg | ORAL_TABLET | Freq: Every day | ORAL | 0 refills | Status: AC

## 2019-05-10 MED ORDER — PROPOFOL 10 MG/ML IV BOLUS
INTRAVENOUS | Status: DC | PRN
  Administered 2019-05-10: 150 mg via INTRAVENOUS
  Administered 2019-05-10: 50 mg via INTRAVENOUS

## 2019-05-10 MED ORDER — 0.9 % SODIUM CHLORIDE (POUR BTL) OPTIME
TOPICAL | Status: DC | PRN
  Administered 2019-05-10: 09:00:00 1000 mL

## 2019-05-10 MED ORDER — OXYCODONE HCL 5 MG PO TABS: 5.0000 mg | ORAL_TABLET | Freq: Once | ORAL | Status: DC | PRN

## 2019-05-10 MED ORDER — FENTANYL CITRATE (PF) 100 MCG/2ML IJ SOLN
25.0000 ug | INTRAMUSCULAR | Status: DC | PRN
  Administered 2019-05-10: 50 ug via INTRAVENOUS

## 2019-05-10 MED ORDER — SODIUM CHLORIDE 0.9 % IV SOLN
INTRAVENOUS | Status: AC
  Filled 2019-05-10: qty 2

## 2019-05-10 MED ORDER — LIDOCAINE 2% (20 MG/ML) 5 ML SYRINGE
INTRAMUSCULAR | Status: AC
  Filled 2019-05-10: qty 10

## 2019-05-10 MED ORDER — ONDANSETRON HCL 4 MG/2ML IJ SOLN
INTRAMUSCULAR | Status: AC
  Filled 2019-05-10: qty 2

## 2019-05-10 MED ORDER — ALVIMOPAN 12 MG PO CAPS
ORAL_CAPSULE | ORAL | Status: AC
  Filled 2019-05-10: qty 1

## 2019-05-10 MED ORDER — ONDANSETRON 4 MG PO TBDP: 4.0000 mg | ORAL_TABLET | Freq: Three times a day (TID) | ORAL | 0 refills | Status: DC | PRN

## 2019-05-10 MED ORDER — CHLORHEXIDINE GLUCONATE 4 % EX LIQD: 60.0000 mL | Freq: Once | CUTANEOUS | Status: DC

## 2019-05-10 MED ORDER — GABAPENTIN 300 MG PO CAPS
ORAL_CAPSULE | ORAL | Status: AC
  Filled 2019-05-10: qty 1

## 2019-05-10 MED ORDER — ONDANSETRON HCL 4 MG/2ML IJ SOLN
INTRAMUSCULAR | Status: DC | PRN
  Administered 2019-05-10: 4 mg via INTRAVENOUS

## 2019-05-10 MED ORDER — HYDROMORPHONE HCL 2 MG PO TABS: 2.0000 mg | ORAL_TABLET | Freq: Four times a day (QID) | ORAL | 0 refills | Status: AC | PRN

## 2019-05-10 MED ORDER — ONDANSETRON HCL 4 MG/2ML IJ SOLN: 4.0000 mg | Freq: Once | INTRAMUSCULAR | Status: DC | PRN

## 2019-05-10 MED ORDER — DEXAMETHASONE SODIUM PHOSPHATE 10 MG/ML IJ SOLN
INTRAMUSCULAR | Status: AC
  Filled 2019-05-10: qty 1

## 2019-05-10 MED ORDER — FENTANYL CITRATE (PF) 100 MCG/2ML IJ SOLN
INTRAMUSCULAR | Status: AC
  Filled 2019-05-10: qty 2

## 2019-05-10 MED ORDER — ENOXAPARIN SODIUM 40 MG/0.4ML ~~LOC~~ SOLN
SUBCUTANEOUS | Status: AC
  Filled 2019-05-10: qty 0.4

## 2019-05-10 MED ORDER — KETAMINE HCL 50 MG/5ML IJ SOSY
PREFILLED_SYRINGE | INTRAMUSCULAR | Status: AC
  Filled 2019-05-10: qty 5

## 2019-05-10 MED ORDER — PROPOFOL 10 MG/ML IV BOLUS
INTRAVENOUS | Status: AC
  Filled 2019-05-10: qty 20

## 2019-05-10 MED ORDER — SODIUM CHLORIDE 0.9 % IR SOLN
Status: DC | PRN
  Administered 2019-05-10 (×2): 6000 mL

## 2019-05-10 MED ORDER — FENTANYL CITRATE (PF) 250 MCG/5ML IJ SOLN
INTRAMUSCULAR | Status: AC
  Filled 2019-05-10: qty 5

## 2019-05-10 MED ORDER — PHENYLEPHRINE 40 MCG/ML (10ML) SYRINGE FOR IV PUSH (FOR BLOOD PRESSURE SUPPORT)
PREFILLED_SYRINGE | INTRAVENOUS | Status: DC | PRN
  Administered 2019-05-10: 80 ug via INTRAVENOUS
  Administered 2019-05-10: 40 ug via INTRAVENOUS

## 2019-05-10 MED ORDER — LACTATED RINGERS IV SOLN: INTRAVENOUS | Status: DC | PRN

## 2019-05-10 MED ORDER — ROCURONIUM BROMIDE 10 MG/ML (PF) SYRINGE
PREFILLED_SYRINGE | INTRAVENOUS | Status: DC | PRN
  Administered 2019-05-10 (×2): 20 mg via INTRAVENOUS
  Administered 2019-05-10: 60 mg via INTRAVENOUS

## 2019-05-10 MED ORDER — KETAMINE HCL 10 MG/ML IJ SOLN
INTRAMUSCULAR | Status: DC | PRN
  Administered 2019-05-10: 10 mg via INTRAVENOUS

## 2019-05-10 MED ORDER — ROCURONIUM BROMIDE 10 MG/ML (PF) SYRINGE
PREFILLED_SYRINGE | INTRAVENOUS | Status: AC
  Filled 2019-05-10: qty 10

## 2019-05-10 MED ORDER — FENTANYL CITRATE (PF) 100 MCG/2ML IJ SOLN: INTRAMUSCULAR | Status: DC | PRN

## 2019-05-10 MED ORDER — BUPIVACAINE-EPINEPHRINE (PF) 0.25% -1:200000 IJ SOLN
INTRAMUSCULAR | Status: AC
  Filled 2019-05-10: qty 30

## 2019-05-10 MED ORDER — OXYCODONE HCL 5 MG/5ML PO SOLN: 5.0000 mg | Freq: Once | ORAL | Status: DC | PRN

## 2019-05-10 MED ORDER — LIDOCAINE 2% (20 MG/ML) 5 ML SYRINGE
INTRAMUSCULAR | Status: DC | PRN
  Administered 2019-05-10: 60 mg via INTRAVENOUS

## 2019-05-10 MED ORDER — PHENYLEPHRINE 40 MCG/ML (10ML) SYRINGE FOR IV PUSH (FOR BLOOD PRESSURE SUPPORT)
PREFILLED_SYRINGE | INTRAVENOUS | Status: AC
  Filled 2019-05-10: qty 20

## 2019-05-10 MED ORDER — KETAMINE HCL 100 MG/ML IJ SOLN
INTRAMUSCULAR | Status: AC
  Filled 2019-05-10: qty 1

## 2019-05-10 MED ORDER — CEFAZOLIN SODIUM-DEXTROSE 2-4 GM/100ML-% IV SOLN
INTRAVENOUS | Status: AC
  Filled 2019-05-10: qty 100

## 2019-05-10 MED ORDER — PROPOFOL 1000 MG/100ML IV EMUL
INTRAVENOUS | Status: AC
  Filled 2019-05-10: qty 100

## 2019-05-10 MED ORDER — CEFAZOLIN SODIUM-DEXTROSE 2-4 GM/100ML-% IV SOLN
2.0000 g | INTRAVENOUS | Status: AC
  Administered 2019-05-10: 08:00:00 2 g via INTRAVENOUS

## 2019-05-10 MED ORDER — DEXAMETHASONE SODIUM PHOSPHATE 10 MG/ML IJ SOLN
INTRAMUSCULAR | Status: DC | PRN
  Administered 2019-05-10: 10 mg via INTRAVENOUS

## 2019-05-10 MED ORDER — SUGAMMADEX SODIUM 200 MG/2ML IV SOLN
INTRAVENOUS | Status: DC | PRN
  Administered 2019-05-10: 200 mg via INTRAVENOUS

## 2019-05-10 SURGICAL SUPPLY — 47 items
ANCH SUT 2 FBRTK  KNTLS CORLS (Anchor) ×4 IMPLANT
ANCHOR SUT HIP FBRTK #2 KNTLS (Anchor) ×4 IMPLANT
APL PRP STRL LF DISP 70% ISPRP (MISCELLANEOUS) ×1
BLADE STRT W/HANDLE 4 (BLADE) ×1 IMPLANT
BLADE SURG 11 STRL SS (BLADE) ×2 IMPLANT
BUR ROUND HI FLUTE 8 4X19 (BURR) IMPLANT
BURR ROUND HI FLUTE 8 4X19 (BURR) ×2
CANNULA PT SHLD/KNEE 8.25X110 (CANNULA) ×1 IMPLANT
CHLORAPREP W/TINT 26 (MISCELLANEOUS) ×2 IMPLANT
COVER WAND RF STERILE (DRAPES) IMPLANT
DECANTER SPIKE VIAL GLASS SM (MISCELLANEOUS) ×2 IMPLANT
DISSECTOR 4.2MMX19CM HL (MISCELLANEOUS) ×1 IMPLANT
DRAPE C-ARM 42X72 X-RAY (DRAPES) ×2 IMPLANT
DRAPE STERI IOBAN 125X83 (DRAPES) ×2 IMPLANT
DRAPE U-SHAPE 47X51 STRL (DRAPES) ×4 IMPLANT
DRSG EMULSION OIL 3X3 NADH (GAUZE/BANDAGES/DRESSINGS) ×1 IMPLANT
DRSG TEGADERM 4X4.75 (GAUZE/BANDAGES/DRESSINGS) ×4 IMPLANT
GAUZE SPONGE 2X2 8PLY STRL LF (GAUZE/BANDAGES/DRESSINGS) IMPLANT
GAUZE SPONGE 4X4 12PLY STRL (GAUZE/BANDAGES/DRESSINGS) ×2 IMPLANT
GLOVE BIO SURGEON STRL SZ7.5 (GLOVE) ×2 IMPLANT
GLOVE BIOGEL PI IND STRL 8 (GLOVE) ×1 IMPLANT
GLOVE BIOGEL PI INDICATOR 8 (GLOVE) ×1
GOWN STRL REUS W/ TWL LRG LVL3 (GOWN DISPOSABLE) ×1 IMPLANT
GOWN STRL REUS W/ TWL XL LVL3 (GOWN DISPOSABLE) ×1 IMPLANT
GOWN STRL REUS W/TWL LRG LVL3 (GOWN DISPOSABLE) ×2
GOWN STRL REUS W/TWL XL LVL3 (GOWN DISPOSABLE) ×2
KIT ANCHOR SUT HIP FBRTK KNTLS (ORTHOPEDIC DISPOSABLE SUPPLIES) ×1 IMPLANT
KIT BASIN OR (CUSTOM PROCEDURE TRAY) ×2 IMPLANT
KIT HIP ARTHROSCOPY (SET/KITS/TRAYS/PACK) ×2 IMPLANT
KIT HIP ARTHROSCOPY BANANA BLD (CANNULA) ×1 IMPLANT
KIT TURNOVER KIT B (KITS) ×2 IMPLANT
MANIFOLD NEPTUNE II (INSTRUMENTS) ×4 IMPLANT
MARKER SKIN DUAL TIP RULER LAB (MISCELLANEOUS) ×2 IMPLANT
PACK SURGICAL SETUP 50X90 (CUSTOM PROCEDURE TRAY) ×2 IMPLANT
PAD ARMBOARD 7.5X6 YLW CONV (MISCELLANEOUS) ×4 IMPLANT
SPONGE GAUZE 2X2 STER 10/PKG (GAUZE/BANDAGES/DRESSINGS) ×2
SPONGE LAP 18X18 RF (DISPOSABLE) ×2 IMPLANT
SUT ETHILON 4 0 PS 2 18 (SUTURE) ×2 IMPLANT
SUT FIBERWIRE #2 38 T-5 BLUE (SUTURE)
SUTURE FIBERWR #2 38 T-5 BLUE (SUTURE) IMPLANT
SYR 50ML LL SCALE MARK (SYRINGE) ×2 IMPLANT
TAPE CLOTH 4X10 WHT NS (GAUZE/BANDAGES/DRESSINGS) ×2 IMPLANT
TOWEL GREEN STERILE (TOWEL DISPOSABLE) ×2 IMPLANT
TUBE CONNECTING 12X1/4 (SUCTIONS) ×2 IMPLANT
TUBING ARTHROSCOPY IRRIG 16FT (MISCELLANEOUS) ×2 IMPLANT
WAND APOLLO RF 50D ABLATOR (BUR) ×1 IMPLANT
WATER STERILE IRR 1000ML POUR (IV SOLUTION) ×2 IMPLANT

## 2019-05-10 NOTE — H&P (Signed)
ORTHOPAEDIC H and P  REQUESTING PHYSICIAN: Nicholes Stairs, MD  PCP:  Berkley Harvey, NP  Chief Complaint: right hip pain  HPI: Cindy Hoffman is a 44 y.o. female who complains of right hip pain now for well over a couple of years.  She has failed exhaustive conservative treatment.  She is also lost considerable weight so that we could perform hip arthroscopy.  She has no new complaints or questions at this time.  Past Medical History:  Diagnosis Date  . Anxiety   . Arthritis    knees  . Asthma   . Chronic pain syndrome   . Complication of anesthesia    "STATES WAKES UP AND CANT BREATHE AND NEEDS BREATHING TREATMENT  IN PACU"  . Constipation due to opioid therapy   . Dysrhythmia    palpitations- "from thyroid"  . Fibromyalgia   . Fracture of right foot   . Gastric ulcer 2018  . GERD (gastroesophageal reflux disease)   . History of kidney stones    "inbedded"  . Migraine    Mirgraine-  . Myofacial muscle pain   . Obesity   . Occipital neuralgia 2016  . OSA (obstructive sleep apnea)    Has CPAP  . Sleep apnea     NO CPAP--could not tolerates mask  . Thyroid disease   . Viral respiratory infection 06/26/13   ? influenza   Past Surgical History:  Procedure Laterality Date  . COLONOSCOPY      x2  . ENDOMETRIAL ABLATION    . ENDOMETRIAL ABLATION  07/17/2017  . GALLBLADDER SURGERY  06/2016  . 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 NASAL SEPTUM SURGERY    . THYROID LOBECTOMY Right 07/19/2013   Procedure: THYROID LOBECTOMY;  Surgeon: Harl Bowie, MD;  Location: [facility];  Service: General;  Laterality: Right;  . TUBAL LIGATION    . WISDOM TOOTH EXTRACTION     Social History   Socioeconomic History  . Marital status: Legally Separated    Spouse name: Not on file  . Number of children: 1  . Years of education: GED  . Highest education level: Not on file  Occupational History  . Occupation: unemployed  Tobacco Use  . Smoking status: Former Smoker   Packs/day: 0.00    Years: 0.00    Pack years: 0.00    Types: Cigarettes    Quit date: 11/2015    Years since quitting: 3.5  . Smokeless tobacco: Never Used  Substance and Sexual Activity  . Alcohol use: No  . Drug use: No  . Sexual activity: Not on file  Other Topics Concern  . Not on file  Social History Narrative   Lives with father   Caffeine use: rare   Right handed   Social Determinants of Health   Financial Resource Strain:   . Difficulty of Paying Living Expenses: Not on file  Food Insecurity:   . Worried About Charity fundraiser in the Last Year: Not on file  . Ran Out of Food in the Last Year: Not on file  Transportation Needs:   . Lack of Transportation (Medical): Not on file  . Lack of Transportation (Non-Medical): Not on file  Physical Activity:   . Days of Exercise per Week: Not on file  . Minutes of Exercise per Session: Not on file  Stress:   . Feeling of Stress : Not on file  Social Connections:   . Frequency of Communication with Friends and  Family: Not on file  . Frequency of Social Gatherings with Friends and Family: Not on file  . Attends Religious Services: Not on file  . Active Member of Clubs or Organizations: Not on file  . Attends Archivist Meetings: Not on file  . Marital Status: Not on file   Family History  Problem Relation Age of Onset  . Hypertension Mother   . Hypertension Father   . Prostate cancer Father   . Diabetes Sister   . Cancer Paternal Grandmother        breast   Allergies  Allergen Reactions  . Beta Adrenergic Blockers Anaphylaxis  . Nsaids     Other reaction(s): Other Contraindicated due to bariatric surgery  . Tolmetin Other (See Comments)    Contraindicated due to bariatric surgery Contraindicated due to bariatric surgery   Prior to Admission medications   Medication Sig Start Date End Date Taking? Authorizing Provider  albuterol (PROVENTIL HFA;VENTOLIN HFA) 108 (90 BASE) MCG/ACT inhaler Inhale 2  puffs into the lungs every 6 (six) hours as needed for wheezing or shortness of breath.    Yes [provider]  amitriptyline (ELAVIL) 25 MG tablet TAKE 4 TABLETS (100 MG TOTAL) BY MOUTH AT BEDTIME. 08/22/18  Yes Ward Givens, NP  botulinum toxin Type A (BOTOX) 100 units SOLR injection Inject 155 units into the muscles of the head and neck every 12 weeks. To be injected by the provider. Discard remainder. 04/16/19  Yes Melvenia Beam, MD  cyanocobalamin (,VITAMIN B-12,) 1000 MCG/ML injection Inject 1,000 mcg into the muscle every 28 (twenty-eight) days. 04/09/19  Yes [provider]  DENTAGEL 1.1 % GEL dental gel Place 1 application onto teeth at bedtime. 12/27/18  Yes [provider]  dicyclomine (BENTYL) 20 MG tablet Take 20 mg by mouth 3 (three) times daily as needed (abdominal spasms.).  09/10/16  Yes [provider]  EMGALITY 120 MG/ML SOAJ INJECT 120 MG INTO THE SKIN EVERY 30 DAYS. 10/01/18  Yes Melvenia Beam, MD  escitalopram (LEXAPRO) 20 MG tablet Take 20 mg by mouth daily. 04/28/19  Yes [provider]  esomeprazole (NEXIUM) 40 MG capsule Take 40 mg by mouth 2 (two) times daily.   Yes [provider]  gabapentin (NEURONTIN) 250 MG/5ML solution Take 900 mg by mouth 3 (three) times daily.   Yes [provider]  hydrOXYzine (ATARAX/VISTARIL) 25 MG tablet Take 25 mg by mouth 2 (two) times daily as needed for anxiety. 12/29/18  Yes [provider]  iron polysaccharides (NIFEREX) 150 MG capsule Take 150 mg by mouth 2 (two) times daily.   Yes [provider]  LORazepam (ATIVAN) 0.5 MG tablet Take 0.5 mg by mouth daily as needed for anxiety.   Yes [provider]  mometasone-formoterol (DULERA) 100-5 MCG/ACT AERO Inhale 2 puffs into the lungs 2 (two) times daily. 02/24/17  Yes [provider]  montelukast (SINGULAIR) 10 MG tablet Take 10 mg by mouth daily. 08/25/17  Yes [provider]    MOVANTIK 25 MG TABS tablet Take 25 mg by mouth at bedtime. 04/12/19  Yes [provider]  Oxycodone HCl 10 MG TABS Take 10 mg by mouth 3 (three) times daily. 04/05/19  Yes [provider]  sucralfate (CARAFATE) 1 g tablet Take 1 g by mouth 4 (four) times daily as needed. 02/07/19  Yes [provider]  tiZANidine (ZANAFLEX) 4 MG tablet TAKE 1-2 TABLETS (4-8 MG TOTAL) BY MOUTH EVERY 8 (EIGHT) HOURS AS  NEEDED FOR MUSCLE SPASMS. Patient taking differently: Take 8 mg by mouth See admin instructions. Take 2 tablets (8 mg) by mouth scheduled at bedtime & take twice daily as needed for spasms. 09/10/18  Yes Melvenia Beam, MD  topiramate (TOPAMAX) 200 MG tablet Take 1 tablet (200 mg total) by mouth at bedtime. 04/11/18  Yes Ward Givens, NP  zolpidem (AMBIEN CR) 12.5 MG CR tablet Take 12.5 mg by mouth at bedtime. 04/23/19  Yes [provider]  albuterol (PROVENTIL) (2.5 MG/3ML) 0.083% nebulizer solution Take 2.5 mg by nebulization every 6 (six) hours as needed for wheezing or shortness of breath.    [provider]  furosemide (LASIX) 20 MG tablet Take 2 tablets (40 mg total) by mouth daily. Patient taking differently: Take 20 mg by mouth daily as needed for fluid.  10/03/14   Etta Quill, NP  ondansetron (ZOFRAN-ODT) 8 MG disintegrating tablet Take 8 mg by mouth every 6 (six) hours as needed for nausea/vomiting. 02/07/19   [provider]  oxyCODONE ER (XTAMPZA ER) 13.5 MG C12A Take 13.5 mg by mouth 2 (two) times daily.    [provider]  promethazine (PHENERGAN) 25 MG tablet Take 25 mg by mouth every 4 (four) hours as needed for nausea or vomiting.    [provider]   No results found.  Positive ROS: All other systems have been reviewed and were otherwise negative with the exception of those mentioned in the HPI and as above.  Physical Exam: General: Alert, no acute distress Cardiovascular: No pedal edema Respiratory: No  cyanosis, no use of accessory musculature GI: No organomegaly, abdomen is soft and non-tender Skin: No lesions in the area of chief complaint Neurologic: Sensation intact distally Psychiatric: Patient is competent for consent with normal mood and affect Lymphatic: No axillary or cervical lymphadenopathy  MUSCULOSKELETAL:  Right lower extremity is clean dry and intact with no open wounds.  Neurovascularly intact.  Assessment: 1.  Right hip labrum tear  2.  Right hip femoral acetabular impingement.  Plan: -Our plan is for arthroscopic intervention on the right hip to include possible labral repair with acetabuloplasty and possible femoroplasty.  We again reviewed the risk and benefits of this procedure at length.  All questions were solicited and answered to her satisfaction.  -She will be discharged home postoperatively from PACU.  She will return tomorrow to [facility] long hospital for radiation treatment on the right hip for heterotopic ossification prophylaxis.    Nicholes Stairs, MD Cell [phone]    05/10/2019 7:15 AM

## 2019-05-10 NOTE — Brief Op Note (Signed)
05/10/2019  9:50 AM  PATIENT:  Cindy Hoffman  44 y.o. female  PRE-OPERATIVE DIAGNOSIS:  Right hip labrum tear, femoroacetabular impingement  POST-OPERATIVE DIAGNOSIS:  right hip labrum tear, femoral-acetabular impingement  PROCEDURE:  Procedure(s) with comments: Right hip arthroscopy with labrum repair and  acetabuloplasty (Right) - 2.5 hrs  SURGEON:  Surgeon(s) and Role:    * Nicholes Stairs, MD - Primary    ASSISTANTS: none   ANESTHESIA:   local and general  EBL:  5 mL   BLOOD ADMINISTERED:none  DRAINS: none   LOCAL MEDICATIONS USED:  MARCAINE     SPECIMEN:  No Specimen  DISPOSITION OF SPECIMEN:  N/A  COUNTS:  YES  TOURNIQUET:  * No tourniquets in log *  DICTATION: .Dragon Dictation  PLAN OF CARE: Discharge to home after PACU  PATIENT DISPOSITION:  PACU - hemodynamically stable.   Delay start of Pharmacological VTE agent (>24hrs) due to surgical blood loss or risk of bleeding: not applicable

## 2019-05-10 NOTE — Anesthesia Procedure Notes (Signed)
Procedure Name: Intubation Date/Time: 05/10/2019 7:41 AM Performed by: Imagene Riches, CRNA Pre-anesthesia Checklist: Patient identified, Emergency Drugs available, Suction available and Patient being monitored Patient Re-evaluated:Patient Re-evaluated prior to induction Oxygen Delivery Method: Circle System Utilized Preoxygenation: Pre-oxygenation with 100% oxygen Induction Type: IV induction Ventilation: Mask ventilation without difficulty Laryngoscope Size: Miller and 2 Grade View: Grade I Tube type: Oral Tube size: 7.0 mm Number of attempts: 1 Airway Equipment and Method: Stylet and Oral airway Placement Confirmation: ETT inserted through vocal cords under direct vision,  positive ETCO2 and breath sounds checked- equal and bilateral Secured at: 21 cm Tube secured with: Tape Dental Injury: Teeth and Oropharynx as per pre-operative assessment

## 2019-05-10 NOTE — Anesthesia Postprocedure Evaluation (Signed)
Anesthesia Post Note  Patient: Cindy Hoffman  Procedure(s) Performed: Right hip arthroscopy with labrum repair and  femoroplasty (Right Hip)     Patient location during evaluation: PACU Anesthesia Type: General Level of consciousness: awake and alert Pain management: pain level controlled Vital Signs Assessment: post-procedure vital signs reviewed and stable Respiratory status: spontaneous breathing, nonlabored ventilation and respiratory function stable Cardiovascular status: blood pressure returned to baseline and stable Postop Assessment: no apparent nausea or vomiting Anesthetic complications: no    Last Vitals:  Vitals:   05/10/19 1148 05/10/19 1218  BP: 111/77 116/77  Pulse: 75 83  Resp: 12   Temp: 37 C   SpO2: 100% 98%    Last Pain:  Vitals:   05/10/19 1133  PainSc: Asleep                 Lidia Collum

## 2019-05-10 NOTE — Discharge Instructions (Signed)
Orthopedic surgery discharge instructions:  -She will be touchdown weightbearing to the right lower extremity for 3 weeks.  She is utilizing crutches for mobilization.  You may begin moving your hip in a rotational fashion as soon as possible.  -You should apply ice to the right hip for 30 minutes out of each hour that you are awake.  -Maintain your postoperative bandage until follow-up appointment.  This is a waterproof bandage and you may shower with this on as soon as you feel able.  Do not submerge underwater.  -Over-the-counter medications you should take:   -you should take an 81 mg aspirin once per day x4 weeks to prevent blood clots.    -You should take Colace 100 mg tablet twice per day to prevent constipation.

## 2019-05-10 NOTE — Anesthesia Preprocedure Evaluation (Addendum)
Anesthesia Evaluation  Patient identified by MRN, date of birth, ID band Patient awake    Reviewed: Allergy & Precautions, NPO status , Patient's Chart, lab work & pertinent test results  History of Anesthesia Complications Negative for: history of anesthetic complications  Airway Mallampati: II  TM Distance: >3 FB Neck ROM: Full    Dental   Pulmonary asthma , sleep apnea , former smoker,    Pulmonary exam normal        Cardiovascular negative cardio ROS Normal cardiovascular exam     Neuro/Psych  Headaches, PSYCHIATRIC DISORDERS Anxiety    GI/Hepatic Neg liver ROS, PUD, GERD  ,  Endo/Other  negative endocrine ROS  Renal/GU negative Renal ROS  negative genitourinary   Musculoskeletal  (+) Fibromyalgia -, narcotic dependent  Abdominal   Peds  Hematology negative hematology ROS (+)   Anesthesia Other Findings   Reproductive/Obstetrics                           Anesthesia Physical Anesthesia Plan  ASA: III  Anesthesia Plan: General   Post-op Pain Management:    Induction: Intravenous  PONV Risk Score and Plan: 3 and Ondansetron, Dexamethasone, Treatment may vary due to age or medical condition and Midazolam  Airway Management Planned: Oral ETT  Additional Equipment: None  Intra-op Plan:   Post-operative Plan: Extubation in OR  Informed Consent: I have reviewed the patients History and Physical, chart, labs and discussed the procedure including the risks, benefits and alternatives for the proposed anesthesia with the patient or authorized representative who has indicated his/her understanding and acceptance.     Dental advisory given  Plan Discussed with:   Anesthesia Plan Comments:        Anesthesia Quick Evaluation
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
## 2019-05-10 NOTE — Progress Notes (Signed)
Spoke with Kim @ [facility] [phone]) and made arrangements for this patient to be at [facility] radiation oncology for treatment at 1100 tomorrow. [facility] is aware the patient presently is in PACU and will have a bed assignment later today. Also, [facility] understands after radiation treatment tomorrow the patient will need to return to [facility]. Receiving nurse will need to phone [facility] with report tomorrow morning.

## 2019-05-10 NOTE — Progress Notes (Signed)
Dr. Christella Hartigan aware patient unable to stay awake.  She will come to bedside.

## 2019-05-10 NOTE — Transfer of Care (Signed)
Immediate Anesthesia Transfer of Care Note  Patient: Cindy Hoffman  Procedure(s) Performed: Right hip arthroscopy with labrum repair and  femoroplasty (Right Hip)  Patient Location: PACU  Anesthesia Type:General  Level of Consciousness: drowsy  Airway & Oxygen Therapy: Patient Spontanous Breathing and Patient connected to nasal cannula oxygen  Post-op Assessment: Report given to RN and Post -op Vital signs reviewed and stable  Post vital signs: Reviewed and stable  Last Vitals:  Vitals Value Taken Time  BP    Temp    Pulse 81 05/10/19 1007  Resp 10 05/10/19 1007  SpO2 100 % 05/10/19 1007  Vitals shown include unvalidated device data.  Last Pain:  Vitals:   05/10/19 0654  PainSc: 0-No pain         Complications: No apparent anesthesia complications

## 2019-05-10 NOTE — Op Note (Signed)
Date: 05/10/2019   INDICATIONS:[name] is a 44 yo female who has right hip femoral acetabular impingement with labral tear.  She has had exhaustive conservative management to include steroid injection as well as physical therapy and oral medications.  She is here today for arthroscopic surgery.  We have previously discussed the risk, benefits, and indications of this procedure including but not limited to bleeding, infection, damage to surrounding neurovascular structures, heterotropic ossification, the risk of bleeding, the risk of stiffness, and need for further surgery as well as the risk of anesthesia. presents today for hip arthroscopy with possible loose body resection as well as femoroplasty to address his underlying hip pathology.; The patientdid consent to the procedure after discussion of the risks and benefits.  PREOPERATIVE DIAGNOSIS: 1. Righthip femoral acetabular impingement 2.Right hip labrum tear  POSTOPERATIVE DIAGNOSIS: 1.Right hip femoral acetabular impingement 2. Right hip anterior superior labral tearing  PROCEDURE: 1.Right hip arthroscopic labrum repair 2.Right hip arthroscopic acetabuloplasty 3. Right hip arthroscopic chondroplasty of posterior acetabulum   SURGEON: Geralynn Rile, M.D.  ASSIST:None.  ANESTHESIA:general  IV FLUIDS AND URINE: See anesthesia.  ESTIMATED BLOOD LOSS:5mL.  IMPLANTS: Arthrex 1.8 mm Fibertak all suture anchor x 3  DRAINS:None  COMPLICATIONS: None.  DESCRIPTION OF PROCEDURE: The patient was on a hand fracture table. A well-padded oversized peroneal post was placed with well-padded traction boots applied to both right and left leg. SCD was on the contralateral limb. Timeout was performed by myself, the surgeon, to confirm correct site of surgery and confirm the procedure to be performed, and confirm IV antibiotics had been given.  Provisional traction was applied to the hip placing adequate  distraction to into the joint. The right hip was then prepped and draped in the usual standard sterile fashion. Femoral traction was reapplied to the hip placing it in the position of slight flexion with neutral rotation and neutral abduction.   Next, standard standard anterior peritrochanteric portal was established just superior and anterior to the superior tip of the greater trochanter. Needle localization with the aid of image intensification and air contrast arthrogram confirmed intra-articular placement. A Nitinol wire was then placed, 11 blade used to make a stab incision and then a 4.5 mm  cannula was inserted atraumatically. Next a mid anterior portal was established via direct visualization in the anterior triangle. It was established approximately 7 cm distant at about a 45 angle off of the axial plane from the previously established anterior peritrochanteric portal. Needle was inserted under direct visualization. A Nitinol wire was then placed an 11 blade used to make a stab incision and a 5.0 cannula was inserted atraumatically.   Operative findings: On the diagnostic arthroscopy of this right hip there was An area of chondral labral dissociation consistent with labrum tear.  This was noted to be at approximately from 12 to 2 oclock anteriorly and then posteriorly around 9-10 oclock a degenerative tear with large chondral flap.  There was no cartilage delamination anteriorly but some noted posteriorly.  There were no loose bodies.Peripherally there was moderate synovitis. Femoral head Cartlige was normal. The acetabular Cartledge was normal. Colloid fossa, ligament teres, and fovea capitis were normal.   At this time, we did an intraportal capsulotomy with a Beaver blade. This was utilized to enlarge the capsular incision to allow for larger cannula insertion into work lateral and medial. Motorized shaver was used to perform a gentle synovectomy. The labrum was then  debrided with the motorized shaver and ArthroCare wand.  Next we then prepared the torn labrum for its repair. First we performed a acetabuloplasty.  Utilizing the motorized burr the acetabular rim was gently decorticated to allow for good bleeding bone.  We resected 4 mm of rim.  Once this was accomplished we then maneuvered the drill guide into place at the 2 o'clock position.  We drilled the pilot hole for the 1.8 mm fiber tach.  The anchor was then placed into the pilot hole.  Care was taken not to penetrate the articular cartilage.  We then placed the anchor which had good bony fixation.  We then passed a simple repair stitch around the labrum with the soft tissue penetrator.  This was then passed through the self locking mechanism of the anchor and showed excellent tension and a nice rolled repair of the labrum.  This was then repeated at the 1 and 12 o'clock position.  A total of 3 anchors were used.    Next while working from the anterior lateral portal and viewing from the mid anterior portal we performed a chondroplasty of the posterior acetabular rim.  There was some delamination near an area of degenerative labral tear.  There was also a large labral flap at the chondral labral junction.  This was likewise debrided with motorized shaver.  Debridement was continued until smooth edges were noted with no unstable border.  At this juncture we were satisfied that the intra-articular portion of the hip arthroscopy was complete. We then removed gross traction placed a traction stitch in the inferior leaflet of the capsulotomy. This allowed improved peripheral inspection of the junction of the anterior lateral and medial head neck junction. On dynamic examination there was no obvious cam impingement.  This was consistent with her preoperative MRI which showed predominant pincer lesion.   Next, a scopic cannulas were removed. Skin incision was closed with 3-0 Monocryl, and Steri-Strips. Wounds  were injected with quarter percent plain Marcaine with epinephrine. The wounds were then dressed with Steri-Strips, Adaptic, 4 x 4's, and a Tegaderm.  Postop plan:  Touchdown weightbearing 3 weeks. She will begin physical therapy next week. She will be on the standard hip arthroscopy postoperative medication protocol to prevent heterotropic ossification and gastric ulcer.  Given her intolerance for NSAIDs we have her tentatively scheduled for radiation treatment tomorrow on the right hip for prevention of heterotropic ossification.  I will see her in the office in 2 weeks.

## 2019-05-10 NOTE — Progress Notes (Signed)
Orthopedic Tech Progress Note Patient Details:  Cindy Hoffman [birth year] [id number] PACU RN called requesting crutches for this patient Ortho Devices Type of Ortho Device: Crutches Ortho Device/Splint Interventions: Adjustment, Application, Ordered   Post Interventions Patient Tolerated: Ambulated well, Well Instructions Provided: Poper ambulation with device, Care of device, Adjustment of device   Janit Pagan 05/10/2019, 12:27 PM

## 2019-05-10 NOTE — Progress Notes (Signed)
Per Shona Simpson, PA-C patient is being discharged home and understands to present tomorrow at 1130 for radiation treatment. This RN phoned Cassie at [facility] a cancel transportation arrangements made with Maudie Mercury at 1155.

## 2019-05-11 ENCOUNTER — Encounter: Payer: Self-pay | Admitting: Radiation Oncology

## 2019-05-11 ENCOUNTER — Ambulatory Visit
Admission: RE | Admit: 2019-05-11 | Discharge: 2019-05-11 | Disposition: A | Discharge status: Home / Self Care | Payer: Medicare Other | Source: Ambulatory Visit | Attending: Radiation Oncology | Admitting: Radiation Oncology

## 2019-05-11 ENCOUNTER — Encounter: Payer: Self-pay | Admitting: *Deleted

## 2019-05-11 ENCOUNTER — Institutional Professional Consult (permissible substitution): Payer: Medicare Other | Admitting: Radiation Oncology

## 2019-05-11 DIAGNOSIS — Z510 Encounter for antineoplastic radiation therapy: Secondary | ICD-10-CM | POA: Diagnosis present

## 2019-05-11 DIAGNOSIS — S32491A Other specified fracture of right acetabulum, initial encounter for closed fracture: Secondary | ICD-10-CM | POA: Diagnosis present

## 2019-05-24 ENCOUNTER — Other Ambulatory Visit: Payer: Self-pay | Admitting: Neurology

## 2019-05-24 NOTE — Telephone Encounter (Signed)
Holding on refill. Pt has been admitted in hospital since 05/10/19.

## 2019-05-30 ENCOUNTER — Ambulatory Visit: Payer: Medicare Other | Admitting: Neurology

## 2019-06-07 ENCOUNTER — Telehealth: Payer: Self-pay | Admitting: Neurology

## 2019-06-07 NOTE — Telephone Encounter (Signed)
I called and rescheduled the patient for her apt. DWD

## 2019-06-07 NOTE — Telephone Encounter (Signed)
I spoke with patient. She states she is fine with rescheduling. Her main concern is her botox. She states that she was supposed to come in last week and has been getting notifications that we have not yet received her botox. I advised her that I would have our coordinator look in to it and give her a call. She will reschedule her appointment at her next botox visit.

## 2019-06-07 NOTE — Telephone Encounter (Signed)
Cindy Hoffman, looks like Cindy Hoffman has a "1 year follow up" with me on Monday but I just saw her a few months ago. Would you call and see if she really needs to come in? If not, maybe we can schedule a new patient there instead? Also, if Cindy Hoffman likes we can get her scheduled on video instead with Amy, she was just in the hospital so sh emay not be that mobile.  thanks

## 2019-06-11 ENCOUNTER — Ambulatory Visit: Payer: Medicare Other | Admitting: Neurology

## 2019-06-20 ENCOUNTER — Other Ambulatory Visit: Payer: Self-pay | Admitting: Neurology

## 2019-06-20 NOTE — Progress Notes (Signed)
  Radiation Oncology         ([phone] ________________________________  Name: Cindy Hoffman MRN: [id number]  Date: 05/11/2019  DOB: [birth year]  End of Treatment Note  Diagnosis:   Postoperative heterotopic ossification     Indication for treatment:  curative       Radiation treatment dates:   05/11/19  Site/dose:   The patient was treated to a dose of 7 Gy to the right hip. This was accomplished with AP and PA fields.  Narrative: The patient tolerated radiation treatment relatively well.   No difficulties.  Plan: The patient has completed radiation treatment. The patient will return to radiation oncology clinic on an as needed basis. ________________________________  Jodelle Gross, M.D., Ph.D.

## 2019-06-21 ENCOUNTER — Other Ambulatory Visit: Payer: Self-pay | Admitting: Neurology

## 2019-06-21 MED ORDER — TIZANIDINE HCL 4 MG PO TABS: ORAL_TABLET | ORAL | 3 refills | Status: DC

## 2019-06-26 ENCOUNTER — Other Ambulatory Visit: Payer: Self-pay | Admitting: Neurology

## 2019-06-26 MED ORDER — BACLOFEN 10 MG PO TABS: 10.0000 mg | ORAL_TABLET | Freq: Three times a day (TID) | ORAL | 6 refills | Status: DC | PRN

## 2019-06-27 ENCOUNTER — Ambulatory Visit: Payer: Medicare Other | Admitting: Neurology

## 2019-07-17 ENCOUNTER — Telehealth: Payer: Self-pay | Admitting: Neurology

## 2019-07-17 NOTE — Telephone Encounter (Signed)
Pt lvm requesting CB to reschedule botox apt

## 2019-07-18 ENCOUNTER — Other Ambulatory Visit: Payer: Self-pay | Admitting: Neurology

## 2019-07-18 MED ORDER — TIZANIDINE HCL 4 MG PO TABS: 4.0000 mg | ORAL_TABLET | Freq: Three times a day (TID) | ORAL | 3 refills | Status: DC | PRN

## 2019-07-27 ENCOUNTER — Other Ambulatory Visit: Payer: Self-pay | Admitting: Adult Health

## 2019-08-15 ENCOUNTER — Telehealth: Payer: Self-pay | Admitting: Neurology

## 2019-08-15 NOTE — Telephone Encounter (Signed)
08/09/2019 Per medicaid Patient will be B/B because Patient has Medicare part D Reff# [id number]. Jayme Cloud

## 2019-08-21 ENCOUNTER — Ambulatory Visit: Payer: Medicare Other | Admitting: Neurology

## 2019-09-24 ENCOUNTER — Telehealth: Payer: Self-pay | Admitting: Neurology

## 2019-09-24 NOTE — Telephone Encounter (Signed)
Lovena Le - Please reschedule this Pt for Botox Injection she was in the hospital last night. Thank you

## 2019-09-24 NOTE — Telephone Encounter (Signed)
I called patient and LVM to reschedule. I advised patient that we do not have anything before July. I requested that she call me back.

## 2019-09-25 ENCOUNTER — Ambulatory Visit: Payer: Medicare Other | Admitting: Neurology

## 2019-09-25 NOTE — Telephone Encounter (Signed)
Patient was in the 3pm spot today and cancelled yesterday because she wasn't feeling well. I don't see a spot on the schedule where I can put patient. Do you have anything in mind?

## 2019-09-25 NOTE — Telephone Encounter (Signed)
Pt called back in regards to botox apt

## 2019-10-02 NOTE — Telephone Encounter (Signed)
See below message from 5/25

## 2019-10-02 NOTE — Telephone Encounter (Signed)
Pt called back to schedule apt

## 2019-10-03 NOTE — Telephone Encounter (Signed)
I called patient back this morning to offer her an appointment with NP. Amy has a 10:30 spot open tomorrow, which I have placed on hold for patient. I LVM asking her to call me back to let me know what she would like to do. Ok per insurance for patient to see NP.

## 2019-10-03 NOTE — Telephone Encounter (Signed)
can we put her in any spot with amy or megan? or do I have to do this?

## 2019-10-03 NOTE — Telephone Encounter (Signed)
I called patient again this afternoon to see if she would like to come in and see Amy tomorrow at 10:30 for Botox. I LVM requesting she call back and advised that we are open until 5.

## 2019-10-08 ENCOUNTER — Other Ambulatory Visit: Payer: Self-pay | Admitting: Neurology

## 2019-10-27 ENCOUNTER — Other Ambulatory Visit: Payer: Self-pay | Admitting: Neurology

## 2019-10-27 ENCOUNTER — Other Ambulatory Visit: Payer: Self-pay | Admitting: Adult Health

## 2019-11-22 ENCOUNTER — Ambulatory Visit: Payer: Medicare Other | Admitting: Neurology

## 2019-11-24 ENCOUNTER — Other Ambulatory Visit: Payer: Self-pay | Admitting: Neurology

## 2019-12-05 ENCOUNTER — Telehealth: Payer: Self-pay | Admitting: Family Medicine

## 2019-12-05 ENCOUNTER — Ambulatory Visit (INDEPENDENT_AMBULATORY_CARE_PROVIDER_SITE_OTHER): Payer: Medicare Other | Admitting: Family Medicine

## 2019-12-05 ENCOUNTER — Encounter: Payer: Self-pay | Admitting: Family Medicine

## 2019-12-05 DIAGNOSIS — G43711 Chronic migraine without aura, intractable, with status migrainosus: Secondary | ICD-10-CM

## 2019-12-05 NOTE — Telephone Encounter (Signed)
Pt is requesting her upcoming Botox appointment on 11/18 to be changed from Amy NP to Dr. Jaynee Eagles. Please advise at [phone].

## 2019-12-05 NOTE — Progress Notes (Addendum)
She returns for Botox today. She was last seen in 02/2019. She reports that she has not felt well over the past few months and has had to reschedule Botox. She is having daily headaches. At least 14-15 are migrainous every month. She is on multiple chronic pain medications that help some. She uses Tylenol for abortive therapy. Last office visit 2019.    Consent Form Botulism Toxin Injection For Chronic Migraine    Reviewed orally with patient, additionally signature is on file:  Botulism toxin has been approved by the Federal drug administration for treatment of chronic migraine. Botulism toxin does not cure chronic migraine and it may not be effective in some patients.  The administration of botulism toxin is accomplished by injecting a small amount of toxin into the muscles of the neck and head. Dosage must be titrated for each individual. Any benefits resulting from botulism toxin tend to wear off after 3 months with a repeat injection required if benefit is to be maintained. Injections are usually done every 3-4 months with maximum effect peak achieved by about 2 or 3 weeks. Botulism toxin is expensive and you should be sure of what costs you will incur resulting from the injection.  The side effects of botulism toxin use for chronic migraine may include:   -Transient, and usually mild, facial weakness with facial injections  -Transient, and usually mild, head or neck weakness with head/neck injections  -Reduction or loss of forehead facial animation due to forehead muscle weakness  -Eyelid drooping  -Dry eye  -Pain at the site of injection or bruising at the site of injection  -Double vision  -Potential unknown long term risks   Contraindications: You should not have Botox if you are pregnant, nursing, allergic to albumin, have an infection, skin condition, or muscle weakness at the site of the injection, or have myasthenia gravis, Lambert-Eaton syndrome, or ALS.  It is also  possible that as with any injection, there may be an allergic reaction or no effect from the medication. Reduced effectiveness after repeated injections is sometimes seen and rarely infection at the injection site may occur. All care will be taken to prevent these side effects. If therapy is given over a long time, atrophy and wasting in the muscle injected may occur. Occasionally the patient's become refractory to treatment because they develop antibodies to the toxin. In this event, therapy needs to be modified.  I have read the above information and consent to the administration of botulism toxin.    BOTOX PROCEDURE NOTE FOR MIGRAINE HEADACHE  Contraindications and precautions discussed with patient(above). Aseptic procedure was observed and patient tolerated procedure. Procedure performed by Debbora Presto, FNP-C.   The condition has existed for more than 6 months, and pt does not have a diagnosis of ALS, Myasthenia Gravis or Lambert-Eaton Syndrome.  Risks and benefits of injections discussed and pt agrees to proceed with the procedure.  Written consent obtained  These injections are medically necessary. Pt  receives good benefits from these injections. These injections do not cause sedations or hallucinations which the oral therapies may cause.   Description of procedure:  The patient was placed in a sitting position. The standard protocol was used for Botox as follows, with 5 units of Botox injected at each site:  -Procerus muscle, midline injection  -Corrugator muscle, bilateral injection  -Frontalis muscle, bilateral injection, with 2 sites each side, medial injection was performed in the upper one third of the frontalis muscle, in the region vertical  from the medial inferior edge of the superior orbital rim. The lateral injection was again in the upper one third of the forehead vertically above the lateral limbus of the cornea, 1.5 cm lateral to the medial injection site.  -Temporalis  muscle injection, 4 sites, bilaterally. The first injection was 3 cm above the tragus of the ear, second injection site was 1.5 cm to 3 cm up from the first injection site in line with the tragus of the ear. The third injection site was 1.5-3 cm forward between the first 2 injection sites. The fourth injection site was 1.5 cm posterior to the second injection site. 5th site laterally in the temporalis  muscleat the level of the outer canthus.  -Occipitalis muscle injection, 3 sites, bilaterally. The first injection was done one half way between the occipital protuberance and the tip of the mastoid process behind the ear. The second injection site was done lateral and superior to the first, 1 fingerbreadth from the first injection. The third injection site was 1 fingerbreadth superiorly and medially from the first injection site.  -Cervical paraspinal muscle injection, 2 sites, bilaterally. The first injection site was 1 cm from the midline of the cervical spine, 3 cm inferior to the lower border of the occipital protuberance. The second injection site was 1.5 cm superiorly and laterally to the first injection site.  -Trapezius muscle injection was performed at 3 sites, bilaterally. The first injection site was in the upper trapezius muscle halfway between the inflection point of the neck, and the acromion. The second injection site was one half way between the acromion and the first injection site. The third injection was done between the first injection site and the inflection point of the neck.   Will return for repeat injection in 3 months.   A total of 200 units of Botox was prepared, 155 units of Botox was injected as documented above, any Botox not injected was wasted. The patient tolerated the procedure well, there were no complications of the above procedure.  Made any corrections needed, and agree with procedure note  Sarina Ill, MD [facility]

## 2019-12-05 NOTE — Progress Notes (Signed)
Botox-200unitsx1 vials Lot: [id number] Expiration: 08/2022 NDC: 9022-8406-98   0.9% Sodium Chloride- 2mL total Lot: [id number] Expiration: 01/2022 NDC: 35430-148-40  Dx: Migraines B/B   Consent signed

## 2019-12-11 NOTE — Telephone Encounter (Signed)
Should I advise this patient that she must stay with Cindy Hoffman for her Botox injections, or is it okay to switch back to Cindy Hoffman?

## 2019-12-11 NOTE — Telephone Encounter (Signed)
Can you switch her to [facility]? See if she likes megan better as I am transitioning all my botox injections to NPs

## 2019-12-12 NOTE — Telephone Encounter (Signed)
Noted thanks °

## 2019-12-12 NOTE — Telephone Encounter (Signed)
FYI - see MyChart message.

## 2020-01-01 ENCOUNTER — Other Ambulatory Visit: Payer: Self-pay | Admitting: Adult Health

## 2020-01-15 ENCOUNTER — Other Ambulatory Visit: Payer: Self-pay | Admitting: Neurology

## 2020-01-20 ENCOUNTER — Other Ambulatory Visit: Payer: Self-pay | Admitting: Adult Health

## 2020-02-04 ENCOUNTER — Encounter (HOSPITAL_COMMUNITY): Payer: Self-pay

## 2020-02-04 ENCOUNTER — Emergency Department (HOSPITAL_COMMUNITY)
Admission: EM | Admit: 2020-02-04 | Discharge: 2020-02-05 | Disposition: A | Discharge status: Home / Self Care | Payer: Medicare Other | Attending: Emergency Medicine | Admitting: Emergency Medicine

## 2020-02-04 ENCOUNTER — Other Ambulatory Visit: Payer: Self-pay

## 2020-02-04 DIAGNOSIS — J45909 Unspecified asthma, uncomplicated: Secondary | ICD-10-CM | POA: Diagnosis not present

## 2020-02-04 DIAGNOSIS — R569 Unspecified convulsions: Secondary | ICD-10-CM | POA: Insufficient documentation

## 2020-02-04 DIAGNOSIS — Z87891 Personal history of nicotine dependence: Secondary | ICD-10-CM | POA: Insufficient documentation

## 2020-02-04 DIAGNOSIS — K529 Noninfective gastroenteritis and colitis, unspecified: Secondary | ICD-10-CM | POA: Diagnosis not present

## 2020-02-04 DIAGNOSIS — K219 Gastro-esophageal reflux disease without esophagitis: Secondary | ICD-10-CM | POA: Insufficient documentation

## 2020-02-04 LAB — COMPREHENSIVE METABOLIC PANEL
ALT: 34 U/L (ref 0–44)
AST: 49 U/L — ABNORMAL HIGH (ref 15–41)
Albumin: 3.1 g/dL — ABNORMAL LOW (ref 3.5–5.0)
Alkaline Phosphatase: 102 U/L (ref 38–126)
Anion gap: 15 (ref 5–15)
BUN: 7 mg/dL (ref 6–20)
CO2: 25 mmol/L (ref 22–32)
Calcium: 8 mg/dL — ABNORMAL LOW (ref 8.9–10.3)
Chloride: 101 mmol/L (ref 98–111)
Creatinine, Ser: 0.65 mg/dL (ref 0.44–1.00)
GFR calc Af Amer: >60 mL/min (ref >60)
GFR calc non Af Amer: >60 mL/min (ref >60)
Glucose, Bld: 120 mg/dL — ABNORMAL HIGH (ref 70–99)
Potassium: 3.1 mmol/L — ABNORMAL LOW (ref 3.5–5.1)
Sodium: 141 mmol/L (ref 135–145)
Total Bilirubin: 1.3 mg/dL — ABNORMAL HIGH (ref 0.3–1.2)
Total Protein: 6.8 g/dL (ref 6.5–8.1)

## 2020-02-04 LAB — CBC
HCT: 41.9 % (ref 36.0–46.0)
Hemoglobin: 14 g/dL (ref 12.0–15.0)
MCH: 28.9 pg (ref 26.0–34.0)
MCHC: 33.4 g/dL (ref 30.0–36.0)
MCV: 86.6 fL (ref 80.0–100.0)
Platelets: 329 10*3/uL (ref 150–400)
RBC: 4.84 MIL/uL (ref 3.87–5.11)
RDW: 18.1 % — ABNORMAL HIGH (ref 11.5–15.5)
WBC: 7.3 10*3/uL (ref 4.0–10.5)
nRBC: 0 % (ref 0.0–0.2)

## 2020-02-04 LAB — LIPASE, BLOOD: Lipase: 29 U/L (ref 11–51)

## 2020-02-04 LAB — I-STAT BETA HCG BLOOD, ED (MC, WL, AP ONLY): I-stat hCG, quantitative: 5.5 m[IU]/mL — ABNORMAL HIGH (ref <5)

## 2020-02-04 MED ORDER — ONDANSETRON 4 MG PO TBDP
4.0000 mg | ORAL_TABLET | Freq: Once | ORAL | Status: AC | PRN
  Administered 2020-02-04: 4 mg via ORAL
  Filled 2020-02-04: qty 1

## 2020-02-04 NOTE — ED Triage Notes (Signed)
Pt BIB [facility] from home. Dad called 39 for a seizure, pt laying on the floor upon EMS arrival, no official dx for seizures, pt called her daughter to come into bathroom and assist her onto the floor. Upon arrival to ED pt dry heaving, nothing coming out, EMS reports pt was not dry heaving with them, pt reports two days N/V/D and generalized abd pain.   BP 122/82 HR 90 RR 16 98% RA CBG 109 98.0  Pt unvaccinated

## 2020-02-05 ENCOUNTER — Emergency Department (HOSPITAL_COMMUNITY): Payer: Medicare Other

## 2020-02-05 DIAGNOSIS — R569 Unspecified convulsions: Secondary | ICD-10-CM | POA: Diagnosis not present

## 2020-02-05 LAB — HCG, QUANTITATIVE, PREGNANCY: hCG, Beta Chain, Quant, S: 6 m[IU]/mL — ABNORMAL HIGH (ref <5)

## 2020-02-05 MED ORDER — ALBUTEROL SULFATE HFA 108 (90 BASE) MCG/ACT IN AERS
2.0000 | INHALATION_SPRAY | Freq: Once | RESPIRATORY_TRACT | Status: AC
  Administered 2020-02-05: 2 via RESPIRATORY_TRACT
  Filled 2020-02-05: qty 6.7

## 2020-02-05 MED ORDER — AMOXICILLIN-POT CLAVULANATE 875-125 MG PO TABS: 1.0000 | ORAL_TABLET | Freq: Two times a day (BID) | ORAL | 0 refills | Status: AC

## 2020-02-05 MED ORDER — PROCHLORPERAZINE EDISYLATE 10 MG/2ML IJ SOLN
10.0000 mg | Freq: Once | INTRAMUSCULAR | Status: AC
  Administered 2020-02-05: 10 mg via INTRAVENOUS
  Filled 2020-02-05: qty 2

## 2020-02-05 MED ORDER — IOHEXOL 300 MG/ML  SOLN
100.0000 mL | Freq: Once | INTRAMUSCULAR | Status: AC | PRN
  Administered 2020-02-05: 100 mL via INTRAVENOUS

## 2020-02-05 MED ORDER — ESCITALOPRAM OXALATE 10 MG PO TABS
20.0000 mg | ORAL_TABLET | Freq: Once | ORAL | Status: AC
  Administered 2020-02-05: 20 mg via ORAL
  Filled 2020-02-05: qty 2

## 2020-02-05 NOTE — ED Notes (Signed)
Patient verbalizes understanding of discharge instructions. Opportunity for questioning and answers were provided. Pt discharged from ED. 

## 2020-02-05 NOTE — Discharge Instructions (Addendum)
You were evaluated in the Emergency Department and after careful evaluation, we did not find any emergent condition requiring admission or further testing in the hospital.  Your exam/testing today was overall reassuring.  Your CT scan showed some inflammation of your colon that we will treat with antibiotics.  Please take as directed.  We also recommend following up with your neurologist regarding your seizure activity.  As discussed, you should not drive a car until you are cleared by neurology.  Please return to the Emergency Department if you experience any worsening of your condition.  Thank you for allowing [place] to be a part of your care.

## 2020-02-05 NOTE — ED Provider Notes (Signed)
[facility] Emergency Department Provider Note MRN:  [id number]  Arrival date & time: 02/05/20     Chief Complaint   Abdominal Pain, Emesis, and Nausea   History of Present Illness   Cindy Hoffman is a 44 y.o. year-old female with a history of chronic pain syndrome, migraines presenting to the ED with chief complaint of seizure.  Patient thinks that she had a seizure yesterday afternoon.  Was sitting on the toilet and felt both of her arms constricted and then she lost consciousness.  She explains that she thinks this is her second seizure, with the first 1 occurring a few months ago.  First seizure was witnessed by family and she had full shaking and rigid behavior at that time.  She is not seen or talked to her doctor about her seizures before.  She thinks she hit her head yesterday during the seizure, she woke up on the ground.  Denies urinary incontinence, no tongue biting.  Denies drugs or alcohol.  She is also endorsing right upper quadrant pain for the past 2 days.  Mild to moderate, constant.  Review of Systems  A complete 10 system review of systems was obtained and all systems are negative except as noted in the HPI and PMH.   Patient's Health History    Past Medical History:  Diagnosis Date  . Anxiety   . Arthritis    knees  . Asthma   . Chronic pain syndrome   . Complication of anesthesia    "STATES WAKES UP AND CANT BREATHE AND NEEDS BREATHING TREATMENT  IN PACU"  . Constipation due to opioid therapy   . Dysrhythmia    palpitations- "from thyroid"  . Fibromyalgia   . Fracture of right foot   . Gastric ulcer 2018  . GERD (gastroesophageal reflux disease)   . History of kidney stones    "inbedded"  . Migraine    Mirgraine-  . Myofacial muscle pain   . Obesity   . Occipital neuralgia 2016  . OSA (obstructive sleep apnea)    Has CPAP  . Sleep apnea     NO CPAP--could not tolerates mask  . Thyroid disease   . Viral respiratory  infection 06/26/13   ? influenza    Past Surgical History:  Procedure Laterality Date  . COLONOSCOPY      x2  . ENDOMETRIAL ABLATION    . ENDOMETRIAL ABLATION  07/17/2017  . GALLBLADDER SURGERY  06/2016  . 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 HIP ARTHROSCOPY Right 05/10/2019   Procedure: Right hip arthroscopy with labrum repair and  femoroplasty;  Surgeon: Nicholes Stairs, MD;  Location: [facility];  Service: Orthopedics;  Laterality: Right;  2.5 hrs  . NASAL SEPTUM SURGERY    . THYROID LOBECTOMY Right 07/19/2013   Procedure: THYROID LOBECTOMY;  Surgeon: Harl Bowie, MD;  Location: [facility];  Service: General;  Laterality: Right;  . TUBAL LIGATION    . WISDOM TOOTH EXTRACTION      Family History  Problem Relation Age of Onset  . Hypertension Mother   . Hypertension Father   . Prostate cancer Father   . Diabetes Sister   . Cancer Paternal Grandmother        breast    Social History   Socioeconomic History  . Marital status: Legally Separated    Spouse name: Not on file  . Number of children: 1  . Years of education: GED  . Highest education level: Not  on file  Occupational History  . Occupation: unemployed  Tobacco Use  . Smoking status: Former Smoker    Packs/day: 0.00    Years: 0.00    Pack years: 0.00    Types: Cigarettes    Quit date: 11/2015    Years since quitting: 4.2  . Smokeless tobacco: Never Used  Vaping Use  . Vaping Use: Never used  Substance and Sexual Activity  . Alcohol use: No  . Drug use: No  . Sexual activity: Not on file  Other Topics Concern  . Not on file  Social History Narrative   Lives with father   Caffeine use: rare   Right handed   Social Determinants of Health   Financial Resource Strain:   . Difficulty of Paying Living Expenses: Not on file  Food Insecurity:   . Worried About Charity fundraiser in the Last Year: Not on file  . Ran Out of Food in the Last Year: Not on file  Transportation Needs:   . Lack of Transportation  (Medical): Not on file  . Lack of Transportation (Non-Medical): Not on file  Physical Activity:   . Days of Exercise per Week: Not on file  . Minutes of Exercise per Session: Not on file  Stress:   . Feeling of Stress : Not on file  Social Connections:   . Frequency of Communication with Friends and Family: Not on file  . Frequency of Social Gatherings with Friends and Family: Not on file  . Attends Religious Services: Not on file  . Active Member of Clubs or Organizations: Not on file  . Attends Archivist Meetings: Not on file  . Marital Status: Not on file  Intimate Partner Violence:   . Fear of Current or Ex-Partner: Not on file  . Emotionally Abused: Not on file  . Physically Abused: Not on file  . Sexually Abused: Not on file     Physical Exam   Vitals:   02/05/20 1115 02/05/20 1215  BP: 113/70 130/76  Pulse: 67 78  Resp: 19 20  Temp:    SpO2: 99% 100%    CONSTITUTIONAL: Well-appearing, NAD NEURO:  Alert and oriented x 3, no focal deficits EYES:  eyes equal and reactive ENT/NECK:  no LAD, no JVD CARDIO: Regular rate, well-perfused, normal S1 and S2 PULM:  CTAB no wheezing or rhonchi GI/GU:  normal bowel sounds, non-distended, non-tender MSK/SPINE:  No gross deformities, no edema SKIN:  no rash, atraumatic PSYCH:  Appropriate speech and behavior  *Additional and/or pertinent findings included in MDM below  Diagnostic and Interventional Summary    EKG Interpretation  Date/Time:  Tuesday February 05 2020 00:53:17 EDT Ventricular Rate:  70 PR Interval:  128 QRS Duration: 88 QT Interval:  502 QTC Calculation: 542 R Axis:   78 Text Interpretation: Normal sinus rhythm Prolonged QT Abnormal ECG Confirmed by Gerlene Fee [phone]) on 02/05/2020 9:42:01 AM      Labs Reviewed  COMPREHENSIVE METABOLIC PANEL - Abnormal; Notable for the following components:      Result Value   Potassium 3.1 (*)    Glucose, Bld 120 (*)    Calcium 8.0 (*)    Albumin 3.1  (*)    AST 49 (*)    Total Bilirubin 1.3 (*)    All other components within normal limits  CBC - Abnormal; Notable for the following components:   RDW 18.1 (*)    All other components within normal limits  HCG, QUANTITATIVE,  PREGNANCY - Abnormal; Notable for the following components:   hCG, Beta Chain, Quant, S 6 (*)    All other components within normal limits  I-STAT BETA HCG BLOOD, ED ([facility], [facility], [facility] ONLY) - Abnormal; Notable for the following components:   I-stat hCG, quantitative 5.5 (*)    All other components within normal limits  LIPASE, BLOOD  URINALYSIS, ROUTINE W REFLEX MICROSCOPIC    CT HEAD WO CONTRAST  Final Result    CT ABDOMEN PELVIS W CONTRAST  Final Result      Medications  ondansetron (ZOFRAN-ODT) disintegrating tablet 4 mg (4 mg Oral Given 02/04/20 1702)  albuterol (VENTOLIN HFA) 108 (90 Base) MCG/ACT inhaler 2 puff (2 puffs Inhalation Given 02/05/20 0925)  prochlorperazine (COMPAZINE) injection 10 mg (10 mg Intravenous Given 02/05/20 0933)  escitalopram (LEXAPRO) tablet 20 mg (20 mg Oral Given 02/05/20 0943)  iohexol (OMNIPAQUE) 300 MG/ML solution 100 mL (100 mLs Intravenous Contrast Given 02/05/20 1055)     Procedures  /  Critical Care Procedures  ED Course and Medical Decision Making  I have reviewed the triage vital signs, the nursing notes, and pertinent available records from the EMR.  Listed above are laboratory and imaging tests that I personally ordered, reviewed, and interpreted and then considered in my medical decision making (see below for details).  Unclear if having syncopal episodes or seizure activity, currently with normal neurological exam, screening with CT head.  Suspect will be able to safely follow-up with neurology.  EKG is without concerning features, slightly prolonged QT.  Patient's abdominal pain seems mild but she has a history of gastric bypass and she is having right upper quadrant tenderness, she is status post cholecystectomy years  ago.  Will CT to exclude complications of bypass.     Work-up is overall reassuring, CT head is normal, CT abdomen demonstrating some mild colitis.  Patient is also experiencing diarrhea.  Will treat for possible infectious colitis with Augmentin.  No dysuria.  Patient has a personal neurologist that she will follow up with regarding her possible seizure activity.  Appropriate for discharge.  Barth Kirks. Sedonia Small, [place] [email or web address]  Final Clinical Impressions(s) / ED Diagnoses     ICD-10-CM   1. Colitis  K52.9   2. Seizure ([place])  R56.9     ED Discharge Orders         Ordered    amoxicillin-clavulanate (AUGMENTIN) 875-125 MG tablet  Every 12 hours        02/05/20 1218           Discharge Instructions Discussed with and Provided to Patient:     Discharge Instructions     You were evaluated in the Emergency Department and after careful evaluation, we did not find any emergent condition requiring admission or further testing in the hospital.  Your exam/testing today was overall reassuring.  Your CT scan showed some inflammation of your colon that we will treat with antibiotics.  Please take as directed.  We also recommend following up with your neurologist regarding your seizure activity.  As discussed, you should not drive a car until you are cleared by neurology.  Please return to the Emergency Department if you experience any worsening of your condition.  Thank you for allowing [place] to be a part of your care.        Maudie Flakes, MD 02/05/20 [phone]

## 2020-02-05 NOTE — ED Notes (Signed)
Pt transported to CT ?

## 2020-03-19 NOTE — Progress Notes (Deleted)
She returns for second Botox, today, following a break in therapy. She also continues amitriptyline, Emgality and topiramate for prevention. Tizanidine helps with abortive therapy. Followed by pain management on gabapentin, robaxin, oxycodone and Xtampza.   Consent Form Botulism Toxin Injection For Chronic Migraine    Reviewed orally with patient, additionally signature is on file:  Botulism toxin has been approved by the Federal drug administration for treatment of chronic migraine. Botulism toxin does not cure chronic migraine and it may not be effective in some patients.  The administration of botulism toxin is accomplished by injecting a small amount of toxin into the muscles of the neck and head. Dosage must be titrated for each individual. Any benefits resulting from botulism toxin tend to wear off after 3 months with a repeat injection required if benefit is to be maintained. Injections are usually done every 3-4 months with maximum effect peak achieved by about 2 or 3 weeks. Botulism toxin is expensive and you should be sure of what costs you will incur resulting from the injection.  The side effects of botulism toxin use for chronic migraine may include:   -Transient, and usually mild, facial weakness with facial injections  -Transient, and usually mild, head or neck weakness with head/neck injections  -Reduction or loss of forehead facial animation due to forehead muscle weakness  -Eyelid drooping  -Dry eye  -Pain at the site of injection or bruising at the site of injection  -Double vision  -Potential unknown long term risks   Contraindications: You should not have Botox if you are pregnant, nursing, allergic to albumin, have an infection, skin condition, or muscle weakness at the site of the injection, or have myasthenia gravis, Lambert-Eaton syndrome, or ALS.  It is also possible that as with any injection, there may be an allergic reaction or no effect from the  medication. Reduced effectiveness after repeated injections is sometimes seen and rarely infection at the injection site may occur. All care will be taken to prevent these side effects. If therapy is given over a long time, atrophy and wasting in the muscle injected may occur. Occasionally the patient's become refractory to treatment because they develop antibodies to the toxin. In this event, therapy needs to be modified.  I have read the above information and consent to the administration of botulism toxin.    BOTOX PROCEDURE NOTE FOR MIGRAINE HEADACHE  Contraindications and precautions discussed with patient(above). Aseptic procedure was observed and patient tolerated procedure. Procedure performed by Debbora Presto, FNP-C.   The condition has existed for more than 6 months, and pt does not have a diagnosis of ALS, Myasthenia Gravis or Lambert-Eaton Syndrome.  Risks and benefits of injections discussed and pt agrees to proceed with the procedure.  Written consent obtained  These injections are medically necessary. Pt  receives good benefits from these injections. These injections do not cause sedations or hallucinations which the oral therapies may cause.   Description of procedure:  The patient was placed in a sitting position. The standard protocol was used for Botox as follows, with 5 units of Botox injected at each site:  -Procerus muscle, midline injection  -Corrugator muscle, bilateral injection  -Frontalis muscle, bilateral injection, with 2 sites each side, medial injection was performed in the upper one third of the frontalis muscle, in the region vertical from the medial inferior edge of the superior orbital rim. The lateral injection was again in the upper one third of the forehead vertically above the lateral limbus  of the cornea, 1.5 cm lateral to the medial injection site.  -Temporalis muscle injection, 4 sites, bilaterally. The first injection was 3 cm above the tragus of the  ear, second injection site was 1.5 cm to 3 cm up from the first injection site in line with the tragus of the ear. The third injection site was 1.5-3 cm forward between the first 2 injection sites. The fourth injection site was 1.5 cm posterior to the second injection site. 5th site laterally in the temporalis  muscleat the level of the outer canthus.  -Occipitalis muscle injection, 3 sites, bilaterally. The first injection was done one half way between the occipital protuberance and the tip of the mastoid process behind the ear. The second injection site was done lateral and superior to the first, 1 fingerbreadth from the first injection. The third injection site was 1 fingerbreadth superiorly and medially from the first injection site.  -Cervical paraspinal muscle injection, 2 sites, bilaterally. The first injection site was 1 cm from the midline of the cervical spine, 3 cm inferior to the lower border of the occipital protuberance. The second injection site was 1.5 cm superiorly and laterally to the first injection site.  -Trapezius muscle injection was performed at 3 sites, bilaterally. The first injection site was in the upper trapezius muscle halfway between the inflection point of the neck, and the acromion. The second injection site was one half way between the acromion and the first injection site. The third injection was done between the first injection site and the inflection point of the neck.   Will return for repeat injection in 3 months.   A total of 200 units of Botox was prepared, 155 units of Botox was injected as documented above, any Botox not injected was wasted. The patient tolerated the procedure well, there were no complications of the above procedure.

## 2020-03-20 ENCOUNTER — Ambulatory Visit: Payer: Self-pay | Admitting: Family Medicine

## 2020-03-20 DIAGNOSIS — G43009 Migraine without aura, not intractable, without status migrainosus: Secondary | ICD-10-CM

## 2020-04-08 NOTE — Telephone Encounter (Signed)
Patient no-showed her 11/18 appointment with Amy. I received a VM from patient this morning stating that she would like to reschedule with Dr. Jaynee Eagles only. Please advise.

## 2020-04-08 NOTE — Telephone Encounter (Signed)
I called the patient and got her scheduled with Dr. Jaynee Eagles for 12/21.

## 2020-04-08 NOTE — Telephone Encounter (Signed)
That's fine but she wilkl have to wait for first available unfortunately

## 2020-04-22 ENCOUNTER — Ambulatory Visit (INDEPENDENT_AMBULATORY_CARE_PROVIDER_SITE_OTHER): Payer: Medicare Other | Admitting: Neurology

## 2020-04-22 DIAGNOSIS — G43711 Chronic migraine without aura, intractable, with status migrainosus: Secondary | ICD-10-CM

## 2020-04-22 NOTE — Progress Notes (Signed)
04/22/2020: 50% improvement since last botox. +5U masseters, +5 units orb oculi, +10 units emergence of occipital nerve bilaterally and the remainder right trap is the worst.   12/05/2019 She returns for Botox today. She was last seen in 02/2019. She reports that she has not felt well over the past few months and has had to reschedule Botox. She is having daily headaches. At least 14-15 are migrainous every month. She is on multiple chronic pain medications that help some. She uses Tylenol for abortive therapy. Last office visit 2019.    Consent Form Botulism Toxin Injection For Chronic Migraine    Reviewed orally with patient, additionally signature is on file:  Botulism toxin has been approved by the Federal drug administration for treatment of chronic migraine. Botulism toxin does not cure chronic migraine and it may not be effective in some patients.  The administration of botulism toxin is accomplished by injecting a small amount of toxin into the muscles of the neck and head. Dosage must be titrated for each individual. Any benefits resulting from botulism toxin tend to wear off after 3 months with a repeat injection required if benefit is to be maintained. Injections are usually done every 3-4 months with maximum effect peak achieved by about 2 or 3 weeks. Botulism toxin is expensive and you should be sure of what costs you will incur resulting from the injection.  The side effects of botulism toxin use for chronic migraine may include:   -Transient, and usually mild, facial weakness with facial injections  -Transient, and usually mild, head or neck weakness with head/neck injections  -Reduction or loss of forehead facial animation due to forehead muscle weakness  -Eyelid drooping  -Dry eye  -Pain at the site of injection or bruising at the site of injection  -Double vision  -Potential unknown long term risks   Contraindications: You should not have Botox if you are pregnant,  nursing, allergic to albumin, have an infection, skin condition, or muscle weakness at the site of the injection, or have myasthenia gravis, Lambert-Eaton syndrome, or ALS.  It is also possible that as with any injection, there may be an allergic reaction or no effect from the medication. Reduced effectiveness after repeated injections is sometimes seen and rarely infection at the injection site may occur. All care will be taken to prevent these side effects. If therapy is given over a long time, atrophy and wasting in the muscle injected may occur. Occasionally the patient's become refractory to treatment because they develop antibodies to the toxin. In this event, therapy needs to be modified.  I have read the above information and consent to the administration of botulism toxin.    BOTOX PROCEDURE NOTE FOR MIGRAINE HEADACHE  Contraindications and precautions discussed with patient(above). Aseptic procedure was observed and patient tolerated procedure. Procedure performed by Debbora Presto, FNP-C.   The condition has existed for more than 6 months, and pt does not have a diagnosis of ALS, Myasthenia Gravis or Lambert-Eaton Syndrome.  Risks and benefits of injections discussed and pt agrees to proceed with the procedure.  Written consent obtained  These injections are medically necessary. Pt  receives good benefits from these injections. These injections do not cause sedations or hallucinations which the oral therapies may cause.   Description of procedure:  The patient was placed in a sitting position. The standard protocol was used for Botox as follows, with 5 units of Botox injected at each site:  -Procerus muscle, midline injection  -  Corrugator muscle, bilateral injection  -Frontalis muscle, bilateral injection, with 2 sites each side, medial injection was performed in the upper one third of the frontalis muscle, in the region vertical from the medial inferior edge of the superior orbital  rim. The lateral injection was again in the upper one third of the forehead vertically above the lateral limbus of the cornea, 1.5 cm lateral to the medial injection site.  -Temporalis muscle injection, 4 sites, bilaterally. The first injection was 3 cm above the tragus of the ear, second injection site was 1.5 cm to 3 cm up from the first injection site in line with the tragus of the ear. The third injection site was 1.5-3 cm forward between the first 2 injection sites. The fourth injection site was 1.5 cm posterior to the second injection site. 5th site laterally in the temporalis  muscleat the level of the outer canthus.  -Occipitalis muscle injection, 3 sites, bilaterally. The first injection was done one half way between the occipital protuberance and the tip of the mastoid process behind the ear. The second injection site was done lateral and superior to the first, 1 fingerbreadth from the first injection. The third injection site was 1 fingerbreadth superiorly and medially from the first injection site.  -Cervical paraspinal muscle injection, 2 sites, bilaterally. The first injection site was 1 cm from the midline of the cervical spine, 3 cm inferior to the lower border of the occipital protuberance. The second injection site was 1.5 cm superiorly and laterally to the first injection site.  -Trapezius muscle injection was performed at 3 sites, bilaterally. The first injection site was in the upper trapezius muscle halfway between the inflection point of the neck, and the acromion. The second injection site was one half way between the acromion and the first injection site. The third injection was done between the first injection site and the inflection point of the neck.   Will return for repeat injection in 3 months.   A total of 200 units of Botox was prepared, 155 units of Botox was injected as documented above, any Botox not injected was wasted. The patient tolerated the procedure well,  there were no complications of the above procedure.  Made any corrections needed, and agree with procedure note  Sarina Ill, MD [facility]

## 2020-04-22 NOTE — Progress Notes (Signed)
Botox consent on file Botox- 200 units x 1 vial Lot: [id number] Expiration: 01/2023 NDC: 7227-7375-05  Bacteriostatic 0.9% Sodium Chloride- 71mL total Lot: [id number] Expiration: 06/03/2021 NDC: 2479-9800-12  Dx: [id number] B/B

## 2020-06-14 ENCOUNTER — Emergency Department (HOSPITAL_COMMUNITY)
Admission: EM | Admit: 2020-06-14 | Discharge: 2020-06-14 | Disposition: A | Discharge status: Home / Self Care | Payer: Medicare Other | Attending: Emergency Medicine | Admitting: Emergency Medicine

## 2020-06-14 ENCOUNTER — Other Ambulatory Visit: Payer: Self-pay

## 2020-06-14 DIAGNOSIS — Z5321 Procedure and treatment not carried out due to patient leaving prior to being seen by health care provider: Secondary | ICD-10-CM | POA: Diagnosis not present

## 2020-06-14 DIAGNOSIS — F329 Major depressive disorder, single episode, unspecified: Secondary | ICD-10-CM | POA: Insufficient documentation

## 2020-06-14 DIAGNOSIS — R440 Auditory hallucinations: Secondary | ICD-10-CM | POA: Diagnosis not present

## 2020-06-14 DIAGNOSIS — R441 Visual hallucinations: Secondary | ICD-10-CM | POA: Insufficient documentation

## 2020-06-14 NOTE — ED Notes (Addendum)
After nurse completed triage, pt asked if she could leave the hospital. Nurse informed pt that she was in the hospital voluntarily. Pt then said she wanted to leave. Nurse informed pt of risks of leaving. Pt said she understood and still wanted to leave. Denied SI/HI. Did not want to be evaluated for auditory hallucinations. Was escorted off premises.

## 2020-06-14 NOTE — ED Notes (Signed)
Pt left AMA without being seen by provider

## 2020-06-14 NOTE — ED Provider Notes (Signed)
  Notified by RN that patient eloped from the department just after triage.  I did not get to see or talk with patient.  She LWBS after triage.   Larene Pickett, PA-C 06/14/20 0202    Margette Fast, MD 06/14/20 [phone]

## 2020-06-14 NOTE — ED Triage Notes (Signed)
From home via EMS, pt family is reporting pt is suffering visual and auditory hallucinations X1 day. Diagnosed history of depression. Negative for SI/HI. Seen at [facility] recently for fall involving head injury. History of alcohol abuse, last alcohol intake unknown.

## 2020-06-27 ENCOUNTER — Other Ambulatory Visit: Payer: Self-pay | Admitting: Neurology

## 2020-07-22 ENCOUNTER — Ambulatory Visit (INDEPENDENT_AMBULATORY_CARE_PROVIDER_SITE_OTHER): Payer: Medicare Other | Admitting: Neurology

## 2020-07-22 DIAGNOSIS — R4020 Unspecified coma: Secondary | ICD-10-CM

## 2020-07-22 DIAGNOSIS — R569 Unspecified convulsions: Secondary | ICD-10-CM | POA: Diagnosis not present

## 2020-07-22 DIAGNOSIS — R443 Hallucinations, unspecified: Secondary | ICD-10-CM

## 2020-07-22 DIAGNOSIS — G43711 Chronic migraine without aura, intractable, with status migrainosus: Secondary | ICD-10-CM | POA: Diagnosis not present

## 2020-07-22 DIAGNOSIS — R404 Transient alteration of awareness: Secondary | ICD-10-CM | POA: Diagnosis not present

## 2020-07-22 NOTE — Progress Notes (Signed)
Botox- 200 units x 1 vial Lot: [id number] Expiration: 04/2023 NDC: 1848-5927-63  Bacteriostatic 0.9% Sodium Chloride- 63mL total Lot: [id number] Expiration: 06/03/2021 NDC: 3794-4461-90  Dx: V22.241 B/B

## 2020-07-22 NOTE — Progress Notes (Signed)
07/22/2020: stable, still doing exceptionally well  04/22/2020: 50% improvement since last botox. +5U masseters, +5 units orb oculi, +10 units emergence of occipital nerve bilaterally and the remainder right trap is the worst.   12/05/2019 She returns for Botox today. She was last seen in 02/2019. She reports that she has not felt well over the past few months and has had to reschedule Botox. She is having daily headaches. At least 14-15 are migrainous every month. She is on multiple chronic pain medications that help some. She uses Tylenol for abortive therapy. Last office visit 2019.    Consent Form Botulism Toxin Injection For Chronic Migraine    Reviewed orally with patient, additionally signature is on file:  Botulism toxin has been approved by the Federal drug administration for treatment of chronic migraine. Botulism toxin does not cure chronic migraine and it may not be effective in some patients.  The administration of botulism toxin is accomplished by injecting a small amount of toxin into the muscles of the neck and head. Dosage must be titrated for each individual. Any benefits resulting from botulism toxin tend to wear off after 3 months with a repeat injection required if benefit is to be maintained. Injections are usually done every 3-4 months with maximum effect peak achieved by about 2 or 3 weeks. Botulism toxin is expensive and you should be sure of what costs you will incur resulting from the injection.  The side effects of botulism toxin use for chronic migraine may include:   -Transient, and usually mild, facial weakness with facial injections  -Transient, and usually mild, head or neck weakness with head/neck injections  -Reduction or loss of forehead facial animation due to forehead muscle weakness  -Eyelid drooping  -Dry eye  -Pain at the site of injection or bruising at the site of injection  -Double vision  -Potential unknown long term  risks   Contraindications: You should not have Botox if you are pregnant, nursing, allergic to albumin, have an infection, skin condition, or muscle weakness at the site of the injection, or have myasthenia gravis, Lambert-Eaton syndrome, or ALS.  It is also possible that as with any injection, there may be an allergic reaction or no effect from the medication. Reduced effectiveness after repeated injections is sometimes seen and rarely infection at the injection site may occur. All care will be taken to prevent these side effects. If therapy is given over a long time, atrophy and wasting in the muscle injected may occur. Occasionally the patient's become refractory to treatment because they develop antibodies to the toxin. In this event, therapy needs to be modified.  I have read the above information and consent to the administration of botulism toxin.    BOTOX PROCEDURE NOTE FOR MIGRAINE HEADACHE  Contraindications and precautions discussed with patient(above). Aseptic procedure was observed and patient tolerated procedure. Procedure performed by Debbora Presto, FNP-C.   The condition has existed for more than 6 months, and pt does not have a diagnosis of ALS, Myasthenia Gravis or Lambert-Eaton Syndrome.  Risks and benefits of injections discussed and pt agrees to proceed with the procedure.  Written consent obtained  These injections are medically necessary. Pt  receives good benefits from these injections. These injections do not cause sedations or hallucinations which the oral therapies may cause.   Description of procedure:  The patient was placed in a sitting position. The standard protocol was used for Botox as follows, with 5 units of Botox injected at each site:  -  Procerus muscle, midline injection  -Corrugator muscle, bilateral injection  -Frontalis muscle, bilateral injection, with 2 sites each side, medial injection was performed in the upper one third of the frontalis muscle, in  the region vertical from the medial inferior edge of the superior orbital rim. The lateral injection was again in the upper one third of the forehead vertically above the lateral limbus of the cornea, 1.5 cm lateral to the medial injection site.  -Temporalis muscle injection, 4 sites, bilaterally. The first injection was 3 cm above the tragus of the ear, second injection site was 1.5 cm to 3 cm up from the first injection site in line with the tragus of the ear. The third injection site was 1.5-3 cm forward between the first 2 injection sites. The fourth injection site was 1.5 cm posterior to the second injection site. 5th site laterally in the temporalis  muscleat the level of the outer canthus.  -Occipitalis muscle injection, 3 sites, bilaterally. The first injection was done one half way between the occipital protuberance and the tip of the mastoid process behind the ear. The second injection site was done lateral and superior to the first, 1 fingerbreadth from the first injection. The third injection site was 1 fingerbreadth superiorly and medially from the first injection site.  -Cervical paraspinal muscle injection, 2 sites, bilaterally. The first injection site was 1 cm from the midline of the cervical spine, 3 cm inferior to the lower border of the occipital protuberance. The second injection site was 1.5 cm superiorly and laterally to the first injection site.  -Trapezius muscle injection was performed at 3 sites, bilaterally. The first injection site was in the upper trapezius muscle halfway between the inflection point of the neck, and the acromion. The second injection site was one half way between the acromion and the first injection site. The third injection was done between the first injection site and the inflection point of the neck.   Will return for repeat injection in 3 months.   A total of 200 units of Botox was prepared, 200 units of Botox was injected as documented above, any  Botox not injected was wasted. The patient tolerated the procedure well, there were no complications of the above procedure.  Made any corrections needed, and agree with procedure note  Sarina Ill, MD [facility]

## 2020-07-22 NOTE — Progress Notes (Signed)
07/22/2020: She fell and hit her head and afterwards was hallucinating, she saw her dead sister, she hallucinated for 3 days, she went to [facility], there is a demon trying to kill her, at [facility] did not find any alcohol in urine, +oxy prescribed, she was also hearing people, reviewed notes from 06/12/20 where she was at the ED, so evidence of acute psychosis on exam, CT of the head and c-spine showed nothing acute, still having ongoing headaches, will order MRI of the brain and still having pain in the right eye, vision changes, patient reports she also had seizure-like activity back in October, she went to [facility], she had altered mentation for 15 minutes, was sitting on the toilet and her limbs locked up and she felt her limbs, shaking, no urinary incontinence, no tongue biting, no drugs or alcohol, reviewed notes from [facility]: in the ED had normal exam, CT head was completed, neuro exam normal, workup was reassuring, no similar events  Since then, discussed 6 months no driving after seizure-like event. This past event may be due to head trauma but auditory and visual hallucinations are unusual.   MRI brain w/wo contrast seizure protocol EEG (if negative, 48 hour ambulatory)  IMPRESSION:   CT HEAD  1. No acute intracranial abnormality identified.  2. No calvarial fracture identified.   CT CERVICAL SPINE  1. No acute fracture or traumatic malalignment identified.  2. No paraspinal hematoma identified.   Exam: NAD, pleasant                  Speech:    Speech is normal; fluent and spontaneous with normal comprehension.  Cognition:    The patient is oriented to person, place, and time;     recent and remote memory intact;     language fluent;    Cranial Nerves:    The pupils are equal, round, and reactive to light.Trigeminal sensation is intact and the muscles of mastication are normal. The face is symmetric. The palate elevates in the midline. Hearing intact. Voice is normal. Shoulder shrug is  normal. The tongue has normal motion without fasciculations.   Coordination:  No dysmetria  Motor Observation:    No asymmetry, no atrophy, and no involuntary movements noted. Tone:    Normal muscle tone.     Strength:    Strength is V/V in the upper and lower limbs.      Sensation: intact to LT    I spent 20 minutes of face-to-face and non-face-to-face time with patient on the seizure-like activity, transient loss of consciousness diagnosis.  This included previsit chart review, lab review, study review, order entry, electronic health record documentation, patient education on the different diagnostic and therapeutic options, counseling and coordination of care, risks and benefits of management, compliance, or risk factor reduction. This does not include time spent on botox injections.  Orders Placed This Encounter  Procedures  . MR BRAIN W WO CONTRAST  . EEG adult

## 2020-07-23 ENCOUNTER — Telehealth: Payer: Self-pay | Admitting: Neurology

## 2020-07-23 NOTE — Telephone Encounter (Signed)
Medicare/medicaid order sent to GI. No auth they will reach out to the patient to schedule.  °

## 2020-07-25 ENCOUNTER — Other Ambulatory Visit: Payer: Self-pay | Admitting: Neurology

## 2020-08-06 ENCOUNTER — Inpatient Hospital Stay: Admission: RE | Admit: 2020-08-06 | Payer: Medicare Other | Source: Ambulatory Visit

## 2020-08-18 ENCOUNTER — Telehealth: Payer: Self-pay

## 2020-08-18 ENCOUNTER — Other Ambulatory Visit: Payer: Medicare Other

## 2020-08-18 NOTE — Telephone Encounter (Signed)
Pt called in to c/a EEG scheduled for today due to family emergency and needing to go out of town.  Pt was offered to reschedule but sts she will call back and reschedule.

## 2020-08-28 ENCOUNTER — Emergency Department (HOSPITAL_COMMUNITY)
Admission: EM | Admit: 2020-08-28 | Discharge: 2020-08-28 | Disposition: A | Discharge status: Home / Self Care | Payer: Medicare Other | Attending: Emergency Medicine | Admitting: Emergency Medicine

## 2020-08-28 ENCOUNTER — Other Ambulatory Visit: Payer: Self-pay

## 2020-08-28 ENCOUNTER — Encounter (HOSPITAL_COMMUNITY): Payer: Self-pay

## 2020-08-28 ENCOUNTER — Emergency Department (HOSPITAL_COMMUNITY): Payer: Medicare Other

## 2020-08-28 DIAGNOSIS — Z7951 Long term (current) use of inhaled steroids: Secondary | ICD-10-CM | POA: Insufficient documentation

## 2020-08-28 DIAGNOSIS — R109 Unspecified abdominal pain: Secondary | ICD-10-CM | POA: Diagnosis not present

## 2020-08-28 DIAGNOSIS — M79602 Pain in left arm: Secondary | ICD-10-CM | POA: Diagnosis not present

## 2020-08-28 DIAGNOSIS — E876 Hypokalemia: Secondary | ICD-10-CM | POA: Insufficient documentation

## 2020-08-28 DIAGNOSIS — F17210 Nicotine dependence, cigarettes, uncomplicated: Secondary | ICD-10-CM | POA: Diagnosis not present

## 2020-08-28 DIAGNOSIS — J45909 Unspecified asthma, uncomplicated: Secondary | ICD-10-CM | POA: Insufficient documentation

## 2020-08-28 DIAGNOSIS — I4581 Long QT syndrome: Secondary | ICD-10-CM | POA: Diagnosis not present

## 2020-08-28 DIAGNOSIS — R079 Chest pain, unspecified: Secondary | ICD-10-CM | POA: Diagnosis not present

## 2020-08-28 DIAGNOSIS — R55 Syncope and collapse: Secondary | ICD-10-CM | POA: Insufficient documentation

## 2020-08-28 DIAGNOSIS — R42 Dizziness and giddiness: Secondary | ICD-10-CM | POA: Diagnosis present

## 2020-08-28 DIAGNOSIS — R9431 Abnormal electrocardiogram [ECG] [EKG]: Secondary | ICD-10-CM

## 2020-08-28 LAB — CBC
HCT: 37.7 % (ref 36.0–46.0)
Hemoglobin: 12.5 g/dL (ref 12.0–15.0)
MCH: 28.1 pg (ref 26.0–34.0)
MCHC: 33.2 g/dL (ref 30.0–36.0)
MCV: 84.7 fL (ref 80.0–100.0)
Platelets: 180 10*3/uL (ref 150–400)
RBC: 4.45 MIL/uL (ref 3.87–5.11)
RDW: 17.3 % — ABNORMAL HIGH (ref 11.5–15.5)
WBC: 6 10*3/uL (ref 4.0–10.5)
nRBC: 0 % (ref 0.0–0.2)

## 2020-08-28 LAB — COMPREHENSIVE METABOLIC PANEL
ALT: 50 U/L — ABNORMAL HIGH (ref 0–44)
AST: 72 U/L — ABNORMAL HIGH (ref 15–41)
Albumin: 2.8 g/dL — ABNORMAL LOW (ref 3.5–5.0)
Alkaline Phosphatase: 112 U/L (ref 38–126)
Anion gap: 9 (ref 5–15)
BUN: 8 mg/dL (ref 6–20)
CO2: 23 mmol/L (ref 22–32)
Calcium: 8 mg/dL — ABNORMAL LOW (ref 8.9–10.3)
Chloride: 103 mmol/L (ref 98–111)
Creatinine, Ser: 0.66 mg/dL (ref 0.44–1.00)
GFR, Estimated: >60 mL/min (ref >60)
Glucose, Bld: 116 mg/dL — ABNORMAL HIGH (ref 70–99)
Potassium: 3 mmol/L — ABNORMAL LOW (ref 3.5–5.1)
Sodium: 135 mmol/L (ref 135–145)
Total Bilirubin: 0.9 mg/dL (ref 0.3–1.2)
Total Protein: 6.2 g/dL — ABNORMAL LOW (ref 6.5–8.1)

## 2020-08-28 LAB — ETHANOL: Alcohol, Ethyl (B): <10 mg/dL (ref <10)

## 2020-08-28 LAB — MAGNESIUM: Magnesium: 1.6 mg/dL — ABNORMAL LOW (ref 1.7–2.4)

## 2020-08-28 LAB — TSH: TSH: 3.471 u[IU]/mL (ref 0.350–4.500)

## 2020-08-28 LAB — POC OCCULT BLOOD, ED: Fecal Occult Bld: NEGATIVE

## 2020-08-28 LAB — TROPONIN I (HIGH SENSITIVITY)
Troponin I (High Sensitivity): 29 ng/L — ABNORMAL HIGH (ref <18)
Troponin I (High Sensitivity): 25 ng/L — ABNORMAL HIGH (ref <18)

## 2020-08-28 MED ORDER — POTASSIUM CHLORIDE 10 MEQ/100ML IV SOLN
10.0000 meq | Freq: Once | INTRAVENOUS | Status: AC
  Administered 2020-08-28: 10 meq via INTRAVENOUS
  Filled 2020-08-28: qty 100

## 2020-08-28 MED ORDER — POTASSIUM CHLORIDE CRYS ER 20 MEQ PO TBCR: 20.0000 meq | EXTENDED_RELEASE_TABLET | Freq: Every day | ORAL | 0 refills | Status: DC

## 2020-08-28 MED ORDER — MAGNESIUM OXIDE -MG SUPPLEMENT 400 (240 MG) MG PO TABS
400.0000 mg | ORAL_TABLET | Freq: Two times a day (BID) | ORAL | Status: DC
  Administered 2020-08-28: 400 mg via ORAL
  Filled 2020-08-28: qty 1

## 2020-08-28 MED ORDER — ACETAMINOPHEN 500 MG PO TABS
1000.0000 mg | ORAL_TABLET | Freq: Once | ORAL | Status: AC
  Administered 2020-08-28: 1000 mg via ORAL
  Filled 2020-08-28: qty 2

## 2020-08-28 MED ORDER — POTASSIUM CHLORIDE CRYS ER 20 MEQ PO TBCR
40.0000 meq | EXTENDED_RELEASE_TABLET | Freq: Once | ORAL | Status: AC
  Administered 2020-08-28: 40 meq via ORAL
  Filled 2020-08-28: qty 2

## 2020-08-28 MED ORDER — MAGNESIUM SULFATE 2 GM/50ML IV SOLN
2.0000 g | Freq: Once | INTRAVENOUS | Status: AC
  Administered 2020-08-28: 2 g via INTRAVENOUS
  Filled 2020-08-28: qty 50

## 2020-08-28 NOTE — Discharge Instructions (Addendum)
Call PCP tomorrow about stopping elavil given prolonged QT Follow up with electrophysiology Take magnesium 400 mg twice daily Take Potassium 20 mg once daily for one week

## 2020-08-28 NOTE — ED Triage Notes (Signed)
Per [facility] pt coming from home with abd pain, pt reports hx of stomach ulcer and bariatric surgery in 2018, pt states having syncopal episodes with dizziness, generalized weakness, fatigue, decreased appetite, and dark stool x3 days. When pt stood up to place in stretcher  +for orthostatic changes systolic 90, now  bp 119/41 hr 60, 100% room, 97.32f oral. Pt family reports pt to have started drinking alcohol excessively again.

## 2020-08-28 NOTE — ED Notes (Signed)
Pt unable to stand for last set of orthostatic vital signs.

## 2020-08-28 NOTE — ED Provider Notes (Signed)
45 y/o female - hx of Bariatric surgery - and stomach ulcers - has had 3 days of black diarrhea - is here with syncopal episod e- she reports dizziness for 6 months - lost balance and felt tired - no LOC - had some CP and left arm numnbes s- numbness has now resolved and CP persists - but has hx of seizures as well - also drinks alcohol.  Also takes chronic opiates for pain management.  Today she was weaker than normal - was "really dizzy", felt the Cp and then collapsed - no mechanical nature of the fall.    Reports to me that she has difficulty with "room spinning" but also reports that she has some light headedness / palpitations - these episdoe come and go and are not always positional  EKG shows QT Prolongation Mild LLQ on palpation - normal heart rate Neuro unremarkable  Needs w/u including ECG, labs, and orthostatics.  Magnesium level.  I have discussed the care of this patient specifically regarding the QT interval with the cardiology on-call physician who recommends that this patient should be seen by electrophysiology in the office, recommends magnesium supplementation twice a day, potassium to be replaced and to potentially remove the offending medications which in her case involves likely amitriptyline.  Again looking at old EKGs I do not see any chronic QT prolongation but the last couple have showed it.  It is long enough tonight that I think she does need referral to cardiology, the patient is agreeable.  She has been given magnesium and potassium here, her troponin is downtrending, I do not think she is having acute ischemia.  She likely has multifactorial reasons that she is feeling dizzy or lightheaded or vertiginous, polypharmacy may be 1 of those, no arrhythmias have been seen here.  She is stable for discharge and understands indications for return  Medical screening examination/treatment/procedure(s) were conducted as a shared visit with non-physician practitioner(s) and myself.   I personally evaluated the patient during the encounter.  Clinical Impression:   Final diagnoses:  QT prolongation  Near syncope  Hypomagnesemia  Hypokalemia         Cindy Chapel, MD 09/06/20 1441

## 2020-08-28 NOTE — ED Notes (Signed)
X-ray at bedside

## 2020-08-28 NOTE — ED Provider Notes (Signed)
[facility] Provider Note   CSN: [id number] Arrival date & time: 08/28/20  1824     History Chief Complaint  Patient presents with  . Dizziness  . Near Syncope  . Abdominal Pain    Cindy Hoffman is a 45 y.o. female.    Patient is a 45 year old female with history of stomach ulcers, bariatric surgery, occipital neurology, seizures, anxiety, chronic pain syndrome (on opioids) presenting for dizziness and presyncopal episode.  Presyncopal event: she felt dizzy, experienced acute, sharp chest pain that radiated to the left arm. Also reports left arm numbness. Denies any LOC. States the chest pain has been constant but denies any current numbness to arm. She also reports vague abdominal pain, nausea, and 1 episode of dry heaving. Also endorses three days of dark/black diarrhea. She denies any frank blood in the stool.   She states she has had three presyncopal events this year. States that all the episodes start with seeing spots, headache, feeling weak, and then collapsing on the floor. She states that in February, she also saw hallucinations and heard her dead sister's voice speaking to her.   Dizziness is intermittent with increasing frequency. It started 6 months ago. She is being followed by neurology.  Of note, father voiced concerned about her alcohol use to EMS. Patient denies consuming alcohol today. Reports her last drink was 4 days ago. She had 4 shots of liquor. States she only drinks once or twice monthly. She denies any recreational drug use.   Current medications are as follow: albuterol Proventil Montelukast movantik Elavil  Gabapentin Oxycodone Emgality topamax    Past Medical History:  Diagnosis Date  . Anxiety   . Arthritis    knees  . Asthma   . Chronic pain syndrome   . Complication of anesthesia    "STATES WAKES UP AND CANT BREATHE AND NEEDS BREATHING TREATMENT  IN PACU"  . Constipation due to opioid therapy    . Dysrhythmia    palpitations- "from thyroid"  . Fibromyalgia   . Fracture of right foot   . Gastric ulcer 2018  . GERD (gastroesophageal reflux disease)   . History of kidney stones    "inbedded"  . Migraine    Mirgraine-  . Myofacial muscle pain   . Obesity   . Occipital neuralgia 2016  . OSA (obstructive sleep apnea)    Has CPAP  . Sleep apnea     NO CPAP--could not tolerates mask  . Thyroid disease   . Viral respiratory infection 06/26/13   ? influenza    Patient Active Problem List   Diagnosis Date Noted  . Labral tear of hip joint 05/10/2019  . Chronic low back pain 08/30/2017  . Chronic pain of both knees 08/30/2017  . Chronic migraine without aura, with intractable migraine, so stated, with status migrainosus 03/06/2017  . Intractable chronic migraine without aura and without status migrainosus 09/29/2016  . Occipital neuralgia 12/12/2015  . Thyroid nodule 07/19/2013  . Right thyroid nodule 06/18/2013  . Morbid obesity ([place]) 05/01/2013  . Asthma, chronic 02/01/2013  . Smoker 02/01/2013    Past Surgical History:  Procedure Laterality Date  . COLONOSCOPY      x2  . ENDOMETRIAL ABLATION    . ENDOMETRIAL ABLATION  07/17/2017  . GALLBLADDER SURGERY  06/2016  . 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 HIP ARTHROSCOPY Right 05/10/2019   Procedure: Right hip arthroscopy with labrum repair and  femoroplasty;  Surgeon: Stann Mainland,  Elly Modena, MD;  Location: [facility];  Service: Orthopedics;  Laterality: Right;  2.5 hrs  . NASAL SEPTUM SURGERY    . THYROID LOBECTOMY Right 07/19/2013   Procedure: THYROID LOBECTOMY;  Surgeon: Harl Bowie, MD;  Location: [facility];  Service: General;  Laterality: Right;  . TUBAL LIGATION    . WISDOM TOOTH EXTRACTION       OB History   No obstetric history on file.     Family History  Problem Relation Age of Onset  . Hypertension Mother   . Hypertension Father   . Prostate cancer Father   . Diabetes Sister   . Cancer Paternal Grandmother         breast    Social History   Tobacco Use  . Smoking status: Current Every Day Smoker    Packs/day: 0.50    Years: 0.00    Pack years: 0.00    Types: Cigarettes    Last attempt to quit: 11/2015    Years since quitting: 4.8  . Smokeless tobacco: Never Used  Vaping Use  . Vaping Use: Never used  Substance Use Topics  . Alcohol use: Yes    Alcohol/week: 2.0 standard drinks    Types: 2 Glasses of wine per week  . Drug use: No    Home Medications Prior to Admission medications   Medication Sig Start Date End Date Taking? Authorizing Provider  potassium chloride SA (KLOR-CON) 20 MEQ tablet Take 1 tablet (20 mEq total) by mouth daily for 7 days. 08/28/20 09/04/20 Yes Sherrill Raring, PA-C  albuterol (PROVENTIL HFA;VENTOLIN HFA) 108 (90 BASE) MCG/ACT inhaler Inhale 2 puffs into the lungs every 6 (six) hours as needed for wheezing or shortness of breath.     [provider]  albuterol (PROVENTIL) (2.5 MG/3ML) 0.083% nebulizer solution Take 2.5 mg by nebulization every 6 (six) hours as needed for wheezing or shortness of breath.    [provider]  amitriptyline (ELAVIL) 25 MG tablet TAKE 4 TABLETS (100 MG TOTAL) BY MOUTH AT BEDTIME. 01/01/20   Lomax, Amy, NP  baclofen (LIORESAL) 10 MG tablet TAKE 1 TABS BY MOUTH 3 TIMES DAILY AS NEEDED FOR MUSCLE SPASMS. PLEASE SPECIFY DIRECTIONS. 06/28/20   Melvenia Beam, MD  botulinum toxin Type A (BOTOX) 100 units SOLR injection Inject 155 units into the muscles of the head and neck every 12 weeks. To be injected by the provider. Discard remainder. 04/16/19   Melvenia Beam, MD  cyanocobalamin (,VITAMIN B-12,) 1000 MCG/ML injection Inject 1,000 mcg into the muscle every 28 (twenty-eight) days. 04/09/19   [provider]  DENTAGEL 1.1 % GEL dental gel Place 1 application onto teeth at bedtime. 12/27/18   [provider]  dicyclomine (BENTYL) 20 MG tablet Take 20 mg by mouth 3 (three) times daily as needed (abdominal  spasms.).  09/10/16   [provider]  EMGALITY 120 MG/ML SOAJ INJECT 120 MG INTO THE SKIN EVERY 30 DAYS. 11/25/19   Melvenia Beam, MD  escitalopram (LEXAPRO) 20 MG tablet Take 20 mg by mouth daily. 04/28/19   [provider]  esomeprazole (NEXIUM) 40 MG capsule Take 40 mg by mouth 2 (two) times daily.    [provider]  furosemide (LASIX) 20 MG tablet Take 2 tablets (40 mg total) by mouth daily. Patient taking differently: Take 20 mg by mouth daily as needed for fluid.  10/03/14   Etta Quill, NP  gabapentin (NEURONTIN) 250 MG/5ML solution Take 900 mg by mouth  3 (three) times daily.    [provider]  hydrOXYzine (ATARAX/VISTARIL) 25 MG tablet Take 25 mg by mouth 2 (two) times daily as needed for anxiety. 12/29/18   [provider]  iron polysaccharides (NIFEREX) 150 MG capsule Take 150 mg by mouth 2 (two) times daily.    [provider]  LORazepam (ATIVAN) 0.5 MG tablet Take 0.5 mg by mouth daily as needed for anxiety.    [provider]  methocarbamol (ROBAXIN) 500 MG tablet Take 1 tablet (500 mg total) by mouth every 6 (six) hours as needed for muscle spasms. 05/10/19   Nicholes Stairs, MD  mometasone-formoterol (DULERA) 100-5 MCG/ACT AERO Inhale 2 puffs into the lungs 2 (two) times daily. 02/24/17   [provider]  montelukast (SINGULAIR) 10 MG tablet Take 10 mg by mouth daily. 08/25/17   [provider]  MOVANTIK 25 MG TABS tablet Take 25 mg by mouth at bedtime. 04/12/19   [provider]  ondansetron (ZOFRAN ODT) 4 MG disintegrating tablet Take 1 tablet (4 mg total) by mouth every 8 (eight) hours as needed. 05/10/19   Nicholes Stairs, MD  ondansetron (ZOFRAN-ODT) 8 MG disintegrating tablet Take 8 mg by mouth every 6 (six) hours as needed for nausea/vomiting. 02/07/19   [provider]  oxyCODONE ER (XTAMPZA ER) 13.5 MG C12A Take 13.5 mg by mouth 2 (two) times daily.    [provider]  Oxycodone HCl 10 MG TABS Take 10 mg by mouth 3 (three) times daily. 04/05/19   [provider]  promethazine (PHENERGAN) 25 MG tablet Take 25 mg by mouth every 4 (four) hours as needed for nausea or vomiting.    [provider]  sucralfate (CARAFATE) 1 g tablet Take 1 g by mouth 4 (four) times daily as needed. 02/07/19   [provider]  tiZANidine (ZANAFLEX) 4 MG tablet TAKE 1-2 TABLETS (4-8 MG TOTAL) BY MOUTH EVERY 8 (EIGHT) HOURS AS NEEDED FOR MUSCLE SPASMS. 07/27/20   Melvenia Beam, MD  topiramate (TOPAMAX) 200 MG tablet TAKE 1 TABLET (200 MG TOTAL) BY MOUTH AT BEDTIME. 01/21/20   Ward Givens, NP  zolpidem (AMBIEN CR) 12.5 MG CR tablet Take 12.5 mg by mouth at bedtime. 04/23/19   [provider]    Allergies    Beta adrenergic blockers, Nsaids, and Tolmetin  Review of Systems   All systems reviewed and are negative except as documented in history of present illness above.  Physical Exam Updated Vital Signs BP 117/79   Pulse 83   Temp (!) 97.4 F (36.3 C) (Oral)   Resp 20   Ht 5\' 4"  (1.626 m)   Wt 81.6 kg   SpO2 100%   BMI 30.90 kg/m   Physical Exam Vitals and nursing note reviewed. Exam conducted with a chaperone present.  Constitutional:      Appearance: Normal appearance.  HENT:     Head: Normocephalic and atraumatic.  Eyes:     General: No scleral icterus.       Right eye: No discharge.        Left eye: No discharge.     Extraocular Movements: Extraocular movements intact.     Pupils: Pupils are equal, round, and reactive to light.  Cardiovascular:     Rate and Rhythm: Normal rate and regular rhythm.     Pulses: Normal pulses.     Heart sounds: Normal heart sounds. No murmur heard. No friction rub. No gallop.   Pulmonary:  Effort: Pulmonary effort is normal. No respiratory distress.     Breath sounds: Normal breath sounds.  Abdominal:     General: Abdomen is flat. Bowel sounds are normal. There is no  distension.     Palpations: Abdomen is soft.  Skin:    General: Skin is warm and dry.     Coloration: Skin is not jaundiced.  Neurological:     Mental Status: She is alert. Mental status is at baseline.     Coordination: Coordination normal.     ED Results / Procedures / Treatments   Labs (all labs ordered are listed, but only abnormal results are displayed) Labs Reviewed  CBC - Abnormal; Notable for the following components:      Result Value   RDW 17.3 (*)    All other components within normal limits  MAGNESIUM - Abnormal; Notable for the following components:   Magnesium 1.6 (*)    All other components within normal limits  COMPREHENSIVE METABOLIC PANEL - Abnormal; Notable for the following components:   Potassium 3.0 (*)    Glucose, Bld 116 (*)    Calcium 8.0 (*)    Total Protein 6.2 (*)    Albumin 2.8 (*)    AST 72 (*)    ALT 50 (*)    All other components within normal limits  TROPONIN I (HIGH SENSITIVITY) - Abnormal; Notable for the following components:   Troponin I (High Sensitivity) 29 (*)    All other components within normal limits  TROPONIN I (HIGH SENSITIVITY) - Abnormal; Notable for the following components:   Troponin I (High Sensitivity) 25 (*)    All other components within normal limits  ETHANOL  TSH  PREGNANCY, URINE  POC OCCULT BLOOD, ED    EKG EKG Interpretation  Date/Time:  Thursday August 28 2020 19:04:01 EDT Ventricular Rate:  69 PR Interval:  160 QRS Duration: 109 QT Interval:  518 QTC Calculation: 555 R Axis:   69 Text Interpretation: Sinus rhythm Prolonged QT interval since last tracing no significant change Confirmed by Noemi Chapel [phone]) on 08/28/2020 8:26:48 PM   Radiology DG Chest Port 1 View  Result Date: 08/28/2020 CLINICAL DATA:  Syncope, fatigue, and weakness. EXAM: PORTABLE CHEST 1 VIEW COMPARISON:  Chest x-ray dated October 13, 2015. FINDINGS: The heart size and mediastinal contours are within normal limits. Both lungs are  clear. The visualized skeletal structures are unremarkable. IMPRESSION: No active disease. Electronically Signed   By: Titus Dubin M.D.   On: 08/28/2020 20:08    Procedures Procedures   Medications Ordered in ED Medications  magnesium oxide TABS 400 mg (400 mg Oral Given 08/28/20 2307)  magnesium sulfate IVPB 2 g 50 mL (0 g Intravenous Stopped 08/28/20 2207)  potassium chloride 10 mEq in 100 mL IVPB (0 mEq Intravenous Stopped 08/28/20 2207)  potassium chloride SA (KLOR-CON) CR tablet 40 mEq (40 mEq Oral Given 08/28/20 2043)  acetaminophen (TYLENOL) tablet 1,000 mg (1,000 mg Oral Given 08/28/20 2140)    ED Course  I have reviewed the triage vital signs and the nursing notes.  Pertinent labs & imaging results that were available during my care of the patient were reviewed by me and considered in my medical decision making (see chart for details).    MDM Rules/Calculators/A&P                          Patient EKG concerning for prolonged QT to 550s. Magnesium 1.6. Potassium 3.0.  Given magnesium and potassium. First troponon mildly elevated to 29. Second troponon 25. FOBT negative. CXR reassuring.  QT prolongation: old EKGs show normal QT intervals. Starting two years ago QT has been prolonged. Only current medication that seems likely to contribute to this is elavil. Her dizziness does not have a clear etiology. It appears vertiginous but could be related to prolonged QT.   Plan: -Call PCP to discuss stopping elavil -Follow up with Electrophysiogist -Take magnesium 400 mg twice daily until EP appointment. -Take potasium 20 mg once daily for one week  Discussed HPI, physical exam and plan of care for this patient with attending Noemi Chapel. The attending physician evaluated this patient as part of a shared visit and agrees with plan of care.   Final Clinical Impression(s) / ED Diagnoses Final diagnoses:  QT prolongation  Near syncope  Hypomagnesemia  Hypokalemia    Rx / DC  Orders ED Discharge Orders         Ordered    potassium chloride SA (KLOR-CON) 20 MEQ tablet  Daily        08/28/20 2300           Sherrill Raring, PA-C 08/28/20 2314    Noemi Chapel, MD 09/06/20 1441

## 2020-08-28 NOTE — ED Notes (Signed)
md at bedside

## 2020-08-28 NOTE — ED Notes (Signed)
md miller at bedside

## 2020-09-01 ENCOUNTER — Other Ambulatory Visit: Payer: Self-pay

## 2020-09-01 ENCOUNTER — Ambulatory Visit (INDEPENDENT_AMBULATORY_CARE_PROVIDER_SITE_OTHER)
Admission: EM | Admit: 2020-09-01 | Discharge: 2020-09-02 | Disposition: A | Discharge status: Other Acute Inpatient Hospital | Payer: Medicare Other | Source: Home / Self Care

## 2020-09-01 DIAGNOSIS — Z560 Unemployment, unspecified: Secondary | ICD-10-CM | POA: Insufficient documentation

## 2020-09-01 DIAGNOSIS — F23 Brief psychotic disorder: Secondary | ICD-10-CM | POA: Insufficient documentation

## 2020-09-01 DIAGNOSIS — F17210 Nicotine dependence, cigarettes, uncomplicated: Secondary | ICD-10-CM | POA: Insufficient documentation

## 2020-09-01 DIAGNOSIS — Z20822 Contact with and (suspected) exposure to covid-19: Secondary | ICD-10-CM | POA: Insufficient documentation

## 2020-09-01 DIAGNOSIS — Z79899 Other long term (current) drug therapy: Secondary | ICD-10-CM | POA: Insufficient documentation

## 2020-09-02 ENCOUNTER — Inpatient Hospital Stay (HOSPITAL_COMMUNITY): Payer: Medicare Other

## 2020-09-02 ENCOUNTER — Emergency Department (HOSPITAL_COMMUNITY): Payer: Medicare Other

## 2020-09-02 ENCOUNTER — Encounter (HOSPITAL_COMMUNITY): Payer: Self-pay

## 2020-09-02 ENCOUNTER — Inpatient Hospital Stay (HOSPITAL_COMMUNITY)
Admission: EM | Admit: 2020-09-02 | Discharge: 2020-09-08 | DRG: 059 | Disposition: A | Discharge status: Home / Self Care | Payer: Medicare Other | Attending: Internal Medicine | Admitting: Internal Medicine

## 2020-09-02 DIAGNOSIS — Z9884 Bariatric surgery status: Secondary | ICD-10-CM

## 2020-09-02 DIAGNOSIS — Z20822 Contact with and (suspected) exposure to covid-19: Secondary | ICD-10-CM | POA: Diagnosis present

## 2020-09-02 DIAGNOSIS — J45909 Unspecified asthma, uncomplicated: Secondary | ICD-10-CM | POA: Diagnosis present

## 2020-09-02 DIAGNOSIS — F23 Brief psychotic disorder: Secondary | ICD-10-CM

## 2020-09-02 DIAGNOSIS — G35 Multiple sclerosis: Secondary | ICD-10-CM | POA: Diagnosis present

## 2020-09-02 DIAGNOSIS — G894 Chronic pain syndrome: Secondary | ICD-10-CM | POA: Diagnosis present

## 2020-09-02 DIAGNOSIS — Z8042 Family history of malignant neoplasm of prostate: Secondary | ICD-10-CM

## 2020-09-02 DIAGNOSIS — M170 Bilateral primary osteoarthritis of knee: Secondary | ICD-10-CM | POA: Diagnosis present

## 2020-09-02 DIAGNOSIS — Z9114 Patient's other noncompliance with medication regimen: Secondary | ICD-10-CM

## 2020-09-02 DIAGNOSIS — G4733 Obstructive sleep apnea (adult) (pediatric): Secondary | ICD-10-CM | POA: Diagnosis present

## 2020-09-02 DIAGNOSIS — Z886 Allergy status to analgesic agent status: Secondary | ICD-10-CM

## 2020-09-02 DIAGNOSIS — G9340 Encephalopathy, unspecified: Secondary | ICD-10-CM | POA: Diagnosis not present

## 2020-09-02 DIAGNOSIS — Z888 Allergy status to other drugs, medicaments and biological substances status: Secondary | ICD-10-CM | POA: Diagnosis not present

## 2020-09-02 DIAGNOSIS — F17210 Nicotine dependence, cigarettes, uncomplicated: Secondary | ICD-10-CM | POA: Diagnosis present

## 2020-09-02 DIAGNOSIS — Z833 Family history of diabetes mellitus: Secondary | ICD-10-CM

## 2020-09-02 DIAGNOSIS — Z8249 Family history of ischemic heart disease and other diseases of the circulatory system: Secondary | ICD-10-CM

## 2020-09-02 DIAGNOSIS — R9389 Abnormal findings on diagnostic imaging of other specified body structures: Secondary | ICD-10-CM

## 2020-09-02 DIAGNOSIS — R9089 Other abnormal findings on diagnostic imaging of central nervous system: Secondary | ICD-10-CM | POA: Diagnosis not present

## 2020-09-02 DIAGNOSIS — F419 Anxiety disorder, unspecified: Secondary | ICD-10-CM | POA: Diagnosis present

## 2020-09-02 DIAGNOSIS — G40909 Epilepsy, unspecified, not intractable, without status epilepticus: Secondary | ICD-10-CM | POA: Diagnosis present

## 2020-09-02 DIAGNOSIS — G43909 Migraine, unspecified, not intractable, without status migrainosus: Secondary | ICD-10-CM | POA: Diagnosis present

## 2020-09-02 DIAGNOSIS — R443 Hallucinations, unspecified: Principal | ICD-10-CM

## 2020-09-02 DIAGNOSIS — E876 Hypokalemia: Secondary | ICD-10-CM | POA: Diagnosis present

## 2020-09-02 DIAGNOSIS — M797 Fibromyalgia: Secondary | ICD-10-CM | POA: Diagnosis present

## 2020-09-02 DIAGNOSIS — K219 Gastro-esophageal reflux disease without esophagitis: Secondary | ICD-10-CM | POA: Diagnosis present

## 2020-09-02 DIAGNOSIS — R4182 Altered mental status, unspecified: Secondary | ICD-10-CM | POA: Diagnosis not present

## 2020-09-02 LAB — LIPID PANEL
Cholesterol: 161 mg/dL (ref 0–200)
HDL: 67 mg/dL (ref >40)
LDL Cholesterol: 71 mg/dL (ref 0–99)
Total CHOL/HDL Ratio: 2.4 RATIO
Triglycerides: 114 mg/dL (ref <150)
VLDL: 23 mg/dL (ref 0–40)

## 2020-09-02 LAB — TSH: TSH: 2.182 u[IU]/mL (ref 0.350–4.500)

## 2020-09-02 LAB — CBC WITH DIFFERENTIAL/PLATELET
Abs Immature Granulocytes: 0.09 10*3/uL — ABNORMAL HIGH (ref 0.00–0.07)
Basophils Absolute: 0 10*3/uL (ref 0.0–0.1)
Basophils Relative: 1 %
Eosinophils Absolute: 0.1 10*3/uL (ref 0.0–0.5)
Eosinophils Relative: 1 %
HCT: 34.9 % — ABNORMAL LOW (ref 36.0–46.0)
Hemoglobin: 11.7 g/dL — ABNORMAL LOW (ref 12.0–15.0)
Immature Granulocytes: 1 %
Lymphocytes Relative: 15 %
Lymphs Abs: 1.2 10*3/uL (ref 0.7–4.0)
MCH: 28.7 pg (ref 26.0–34.0)
MCHC: 33.5 g/dL (ref 30.0–36.0)
MCV: 85.5 fL (ref 80.0–100.0)
Monocytes Absolute: 0.7 10*3/uL (ref 0.1–1.0)
Monocytes Relative: 9 %
Neutro Abs: 5.9 10*3/uL (ref 1.7–7.7)
Neutrophils Relative %: 73 %
Platelets: 227 10*3/uL (ref 150–400)
RBC: 4.08 MIL/uL (ref 3.87–5.11)
RDW: 19.8 % — ABNORMAL HIGH (ref 11.5–15.5)
WBC: 8 10*3/uL (ref 4.0–10.5)
nRBC: 0 % (ref 0.0–0.2)

## 2020-09-02 LAB — RESP PANEL BY RT-PCR (FLU A&B, COVID) ARPGX2
Influenza A by PCR: NEGATIVE
Influenza B by PCR: NEGATIVE
SARS Coronavirus 2 by RT PCR: NEGATIVE

## 2020-09-02 LAB — HEMOGLOBIN A1C
Hgb A1c MFr Bld: 5.4 % (ref 4.8–5.6)
Mean Plasma Glucose: 108.28 mg/dL

## 2020-09-02 LAB — CSF CELL COUNT WITH DIFFERENTIAL
RBC Count, CSF: 0 /mm3
RBC Count, CSF: 240 /mm3 — ABNORMAL HIGH
Tube #: 1
Tube #: 4
WBC, CSF: 1 /mm3 (ref 0–5)
WBC, CSF: 2 /mm3 (ref 0–5)

## 2020-09-02 LAB — COMPREHENSIVE METABOLIC PANEL
ALT: 29 U/L (ref 0–44)
AST: 25 U/L (ref 15–41)
Albumin: 3.5 g/dL (ref 3.5–5.0)
Alkaline Phosphatase: 113 U/L (ref 38–126)
Anion gap: 11 (ref 5–15)
BUN: 14 mg/dL (ref 6–20)
CO2: 24 mmol/L (ref 22–32)
Calcium: 8.7 mg/dL — ABNORMAL LOW (ref 8.9–10.3)
Chloride: 98 mmol/L (ref 98–111)
Creatinine, Ser: 0.56 mg/dL (ref 0.44–1.00)
GFR, Estimated: >60 mL/min (ref >60)
Glucose, Bld: 111 mg/dL — ABNORMAL HIGH (ref 70–99)
Potassium: 2.9 mmol/L — ABNORMAL LOW (ref 3.5–5.1)
Sodium: 133 mmol/L — ABNORMAL LOW (ref 135–145)
Total Bilirubin: 0.8 mg/dL (ref 0.3–1.2)
Total Protein: 7 g/dL (ref 6.5–8.1)

## 2020-09-02 LAB — POCT PREGNANCY, URINE: Preg Test, Ur: NEGATIVE

## 2020-09-02 LAB — POCT URINE DRUG SCREEN - MANUAL ENTRY (I-SCREEN)
POC Amphetamine UR: NOT DETECTED
POC Buprenorphine (BUP): NOT DETECTED
POC Cocaine UR: NOT DETECTED
POC Marijuana UR: NOT DETECTED
POC Methadone UR: NOT DETECTED
POC Methamphetamine UR: NOT DETECTED
POC Morphine: NOT DETECTED
POC Oxazepam (BZO): NOT DETECTED
POC Oxycodone UR: POSITIVE — AB
POC Secobarbital (BAR): NOT DETECTED

## 2020-09-02 LAB — POC SARS CORONAVIRUS 2 AG: SARSCOV2ONAVIRUS 2 AG: NEGATIVE

## 2020-09-02 LAB — PROTEIN AND GLUCOSE, CSF
Glucose, CSF: 76 mg/dL — ABNORMAL HIGH (ref 40–70)
Total  Protein, CSF: 24 mg/dL (ref 15–45)

## 2020-09-02 LAB — MAGNESIUM: Magnesium: 2 mg/dL (ref 1.7–2.4)

## 2020-09-02 LAB — ETHANOL: Alcohol, Ethyl (B): <10 mg/dL (ref <10)

## 2020-09-02 MED ORDER — THIAMINE HCL 100 MG/ML IJ SOLN
500.0000 mg | Freq: Once | INTRAVENOUS | Status: AC
  Administered 2020-09-02: 500 mg via INTRAVENOUS
  Filled 2020-09-02 (×2): qty 5

## 2020-09-02 MED ORDER — TOPIRAMATE 100 MG PO TABS
200.0000 mg | ORAL_TABLET | Freq: Every day | ORAL | Status: DC
  Administered 2020-09-02 – 2020-09-07 (×6): 200 mg via ORAL
  Filled 2020-09-02 (×5): qty 2
  Filled 2020-09-02: qty 8

## 2020-09-02 MED ORDER — ESCITALOPRAM OXALATE 10 MG PO TABS
20.0000 mg | ORAL_TABLET | Freq: Every day | ORAL | Status: DC
  Administered 2020-09-02: 20 mg via ORAL
  Filled 2020-09-02: qty 2

## 2020-09-02 MED ORDER — LORAZEPAM 0.5 MG PO TABS: 0.5000 mg | ORAL_TABLET | Freq: Every day | ORAL | Status: DC | PRN

## 2020-09-02 MED ORDER — POTASSIUM CHLORIDE CRYS ER 20 MEQ PO TBCR
40.0000 meq | EXTENDED_RELEASE_TABLET | Freq: Once | ORAL | Status: AC
  Administered 2020-09-02: 40 meq via ORAL
  Filled 2020-09-02: qty 2

## 2020-09-02 MED ORDER — HYDROXYZINE HCL 25 MG PO TABS
25.0000 mg | ORAL_TABLET | Freq: Two times a day (BID) | ORAL | Status: DC | PRN
  Administered 2020-09-03 – 2020-09-08 (×10): 25 mg via ORAL
  Filled 2020-09-02 (×11): qty 1

## 2020-09-02 MED ORDER — NALOXEGOL OXALATE 25 MG PO TABS
25.0000 mg | ORAL_TABLET | Freq: Every day | ORAL | Status: DC
  Administered 2020-09-02 – 2020-09-07 (×5): 25 mg via ORAL
  Filled 2020-09-02 (×8): qty 1

## 2020-09-02 MED ORDER — GABAPENTIN 300 MG PO CAPS
900.0000 mg | ORAL_CAPSULE | Freq: Three times a day (TID) | ORAL | Status: DC
  Administered 2020-09-02 – 2020-09-08 (×18): 900 mg via ORAL
  Filled 2020-09-02 (×18): qty 3

## 2020-09-02 MED ORDER — AMITRIPTYLINE HCL 50 MG PO TABS
100.0000 mg | ORAL_TABLET | Freq: Every day | ORAL | Status: DC
  Administered 2020-09-02 – 2020-09-07 (×6): 100 mg via ORAL
  Filled 2020-09-02: qty 2
  Filled 2020-09-02: qty 4
  Filled 2020-09-02 (×3): qty 2
  Filled 2020-09-02: qty 4
  Filled 2020-09-02 (×2): qty 2
  Filled 2020-09-02: qty 4
  Filled 2020-09-02: qty 1
  Filled 2020-09-02: qty 4
  Filled 2020-09-02: qty 2
  Filled 2020-09-02: qty 4

## 2020-09-02 MED ORDER — MAGNESIUM SULFATE 2 GM/50ML IV SOLN
2.0000 g | Freq: Once | INTRAVENOUS | Status: AC
  Administered 2020-09-02: 2 g via INTRAVENOUS
  Filled 2020-09-02: qty 50

## 2020-09-02 MED ORDER — ESCITALOPRAM OXALATE 10 MG PO TABS
20.0000 mg | ORAL_TABLET | Freq: Every day | ORAL | Status: DC
  Administered 2020-09-03 – 2020-09-08 (×6): 20 mg via ORAL
  Filled 2020-09-02 (×6): qty 2

## 2020-09-02 MED ORDER — OXYCODONE HCL 5 MG PO TABS
10.0000 mg | ORAL_TABLET | Freq: Four times a day (QID) | ORAL | Status: DC | PRN
  Administered 2020-09-02 – 2020-09-08 (×15): 10 mg via ORAL
  Filled 2020-09-02 (×16): qty 2

## 2020-09-02 MED ORDER — ACETAMINOPHEN 325 MG PO TABS
650.0000 mg | ORAL_TABLET | Freq: Four times a day (QID) | ORAL | Status: DC | PRN
  Administered 2020-09-05 – 2020-09-06 (×3): 650 mg via ORAL
  Filled 2020-09-02 (×3): qty 2

## 2020-09-02 MED ORDER — OXYCODONE HCL ER 10 MG PO T12A
10.0000 mg | EXTENDED_RELEASE_TABLET | Freq: Two times a day (BID) | ORAL | Status: DC
  Administered 2020-09-03 – 2020-09-08 (×11): 10 mg via ORAL
  Filled 2020-09-02 (×11): qty 1

## 2020-09-02 MED ORDER — PANTOPRAZOLE SODIUM 40 MG PO TBEC
40.0000 mg | DELAYED_RELEASE_TABLET | Freq: Every day | ORAL | Status: DC
  Filled 2020-09-02: qty 1

## 2020-09-02 MED ORDER — ACETAMINOPHEN 325 MG PO TABS
650.0000 mg | ORAL_TABLET | Freq: Once | ORAL | Status: AC
  Administered 2020-09-02: 650 mg via ORAL
  Filled 2020-09-02: qty 2

## 2020-09-02 MED ORDER — ONDANSETRON HCL 4 MG PO TABS: 4.0000 mg | ORAL_TABLET | Freq: Four times a day (QID) | ORAL | Status: DC | PRN

## 2020-09-02 MED ORDER — ACETAMINOPHEN 650 MG RE SUPP: 650.0000 mg | Freq: Four times a day (QID) | RECTAL | Status: DC | PRN

## 2020-09-02 MED ORDER — MOMETASONE FURO-FORMOTEROL FUM 100-5 MCG/ACT IN AERO
2.0000 | INHALATION_SPRAY | Freq: Two times a day (BID) | RESPIRATORY_TRACT | Status: DC
  Administered 2020-09-02 – 2020-09-08 (×11): 2 via RESPIRATORY_TRACT
  Filled 2020-09-02 (×2): qty 8.8

## 2020-09-02 MED ORDER — BACLOFEN 10 MG PO TABS
10.0000 mg | ORAL_TABLET | Freq: Three times a day (TID) | ORAL | Status: DC | PRN
  Filled 2020-09-02: qty 1

## 2020-09-02 MED ORDER — DICYCLOMINE HCL 20 MG PO TABS
20.0000 mg | ORAL_TABLET | Freq: Three times a day (TID) | ORAL | Status: DC | PRN
  Administered 2020-09-06: 20 mg via ORAL
  Filled 2020-09-02 (×2): qty 1

## 2020-09-02 MED ORDER — ALBUTEROL SULFATE HFA 108 (90 BASE) MCG/ACT IN AERS
2.0000 | INHALATION_SPRAY | Freq: Four times a day (QID) | RESPIRATORY_TRACT | Status: DC | PRN
  Filled 2020-09-02: qty 6.7

## 2020-09-02 MED ORDER — ENOXAPARIN SODIUM 40 MG/0.4ML IJ SOSY
40.0000 mg | PREFILLED_SYRINGE | INTRAMUSCULAR | Status: DC
  Administered 2020-09-02 – 2020-09-07 (×6): 40 mg via SUBCUTANEOUS
  Filled 2020-09-02 (×6): qty 0.4

## 2020-09-02 MED ORDER — GADOBUTROL 1 MMOL/ML IV SOLN
8.0000 mL | Freq: Once | INTRAVENOUS | Status: AC | PRN
  Administered 2020-09-02: 8 mL via INTRAVENOUS

## 2020-09-02 MED ORDER — LIDOCAINE HCL 2 % IJ SOLN
10.0000 mL | Freq: Once | INTRAMUSCULAR | Status: AC
  Administered 2020-09-02: 200 mg via INTRADERMAL
  Filled 2020-09-02: qty 20

## 2020-09-02 MED ORDER — ONDANSETRON HCL 4 MG/2ML IJ SOLN
4.0000 mg | Freq: Four times a day (QID) | INTRAMUSCULAR | Status: DC | PRN
Start: 2020-09-02 — End: 2020-09-09
  Filled 2020-09-02: qty 2

## 2020-09-02 NOTE — ED Provider Notes (Signed)
Behavioral Health Admission H&P [facility] & OBS)  Date: 09/02/20 Patient Name: Cindy Hoffman MRN: [id number] Chief Complaint: No chief complaint on file.  Chief Complaint/Presenting Problem: Patient said she was brought to [facility] by police.  She doe snot know why she is here.  Patient said that she "was feeling hot and sitting in a chair."  Pt was observed by NP Syra Sirmons to be talking to herself.  Patient denies any SI, HI or A/V hallucinations.  Pt IVC'ed by family member.  Diagnoses:  Final diagnoses:  Acute psychosis ([place])    HPI: Cindy Hoffman is a 45 y/o female. Patient presented to [facility] under IVC paper petitioned by her father. Patient report that she is unsure why she is at [facility] or why her father IVC'ed her family member. Patient report a history of depression and anxiety. She report that she takes her medication as prescribed.   She denies suicidal ideation, homicidal ideation, paranoia, and hallucination however she asked this Probation officer several times if Probation officer can hear "voices of people arguing on internet chat." Patient also denies illicit drug use and alcohol use. She report that she lives with at home with her brother and father.   Patient was assessed upon arrived to [facility]. Patient noted to be very guarded during assessment and denied any psychiatric concerns. However patient was noted in the hallway talking with self, at times patient will speak in a loud voice and talks about "internet people" she was observed talking into her hands and appeared to be responding to internal stimuli.   Patient is alert and oriented x3, patient is clear and tangential. Thought process is disorganized, her mood is euthymic and congruent with affect. Patient denies SOB, chest pain, dizziness, headache, Gi/GU symptoms.  Collateral information obtained by Ane [employer], LCAS: Father said that patient had to go to [facility] on Friday or Saturday due to her blood pressure being low.  He said that she has been drinking  vodka "and a lot of it."    He said that patient was for the last three days has been not sleeping.  Talking to herself and talking to her deceased sister.  Patient talked about "God is watching" and saying she is seeing things not there.  He says that she, yesterday, had put a bunch of knives around the house.  He said she has not threatened anyone with them.  She told him earlier today not to go to the bathroom "because they are having sex in it."  He said that the bathroom was empty.    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  She knows that she has to stay away from ETOH but she is bringing it home and "drinking it straight out."  He thinks that she has been drinking without him knowing for the past year.  Father said that there were about 15 bottles of vodka in her room.    He said that she has been walking up and down the street pointing at people not there.  He said she has not been sleeping well over the last few days.    PHQ 2-9:   [place] [facility] from 09/01/2020 in [facility] from 08/28/2020 in [place] [facility] from 06/14/2020 in [facility]  C-SSRS RISK CATEGORY No Risk No Risk No Risk       Total Time spent with patient: 30 minutes  Musculoskeletal  Strength &  Muscle Tone: within normal limits Gait & Station: normal Patient leans: Right  Psychiatric Specialty Exam  Presentation General Appearance: Appropriate for Environment  Eye Contact:Good  Speech:Normal Rate  Speech Volume:Normal  Handedness:Right   Mood and Affect  Mood:Euthymic  Affect:Congruent   Thought Process  Thought Processes:Disorganized  Descriptions of Associations:Tangential  Orientation:Partial  Thought Content:Tangential  Diagnosis of Schizophrenia or Schizoaffective disorder in past: No   Hallucinations:Hallucinations: Auditory; Visual Description of Auditory  Hallucinations: "hearig voices of people arguing on internet chat" Description of Visual Hallucinations: seing step sister giggling  Ideas of Reference:Paranoia  Suicidal Thoughts:Suicidal Thoughts: No  Homicidal Thoughts:Homicidal Thoughts: No   Sensorium  Memory:Immediate Good; Recent Good; Remote Good  Judgment:Poor  Insight:Lacking   Executive Functions  Concentration:Fair  Attention Span:Fair  Recall:Poor  Fund of Knowledge:No data recorded Language:No data recorded  Psychomotor Activity  Psychomotor Activity:Psychomotor Activity: Normal   Assets  Assets:Housing; Social Support; Physical Health   Sleep  Sleep:Sleep: -- (unable to answer)   Nutritional Assessment (For OBS and FBC admissions only) Has the patient had a weight loss or gain of 10 pounds or more in the last 3 months?: No Has the patient had a decrease in food intake/or appetite?: No Does the patient have dental problems?: No Does the patient have eating habits or behaviors that may be indicators of an eating disorder including binging or inducing vomiting?: No Has the patient recently lost weight without trying?: No Has the patient been eating poorly because of a decreased appetite?: No Malnutrition Screening Tool Score: 0    Physical Exam Vitals and nursing note reviewed.  Constitutional:      General: She is not in acute distress.    Appearance: She is well-developed.  HENT:     Head: Normocephalic and atraumatic.  Eyes:     General:        Right eye: No discharge.        Left eye: No discharge.     Conjunctiva/sclera: Conjunctivae normal.  Cardiovascular:     Rate and Rhythm: Normal rate and regular rhythm.     Heart sounds: No murmur heard.   Pulmonary:     Effort: Pulmonary effort is normal. No respiratory distress.     Breath sounds: Normal breath sounds.  Abdominal:     Palpations: Abdomen is soft.     Tenderness: There is no abdominal tenderness.  Musculoskeletal:      Cervical back: Neck supple.  Skin:    General: Skin is warm and dry.  Neurological:     Mental Status: She is alert and oriented to person, place, and time.  Psychiatric:        Attention and Perception: She is attentive. She perceives auditory hallucinations.        Mood and Affect: Mood is anxious.        Speech: Speech normal.        Behavior: Behavior normal. Behavior is cooperative.        Thought Content: Thought content is not paranoid or delusional. Thought content does not include homicidal or suicidal ideation. Thought content does not include homicidal or suicidal plan.        Cognition and Memory: Cognition normal.        Judgment: Judgment normal.    Review of Systems  Constitutional: Negative for chills, fever and weight loss.  HENT: Negative.   Eyes: Negative for discharge and redness.  Respiratory: Negative for cough, hemoptysis, sputum production and shortness of breath.  Cardiovascular: Negative for chest pain and palpitations.  Gastrointestinal: Negative.   Genitourinary: Negative.   Musculoskeletal: Negative.   Skin: Negative.   Neurological: Negative.  Negative for tingling and headaches.  Endo/Heme/Allergies: Negative.   Psychiatric/Behavioral: Positive for depression, hallucinations and substance abuse. Negative for suicidal ideas. The patient is nervous/anxious.     Blood pressure (!) 148/90, pulse (!) 104, temperature 99 F (37.2 C), temperature source Oral, resp. rate 18, SpO2 99 %. There is no height or weight on file to calculate BMI.  Past Psychiatric History: Depression and anxiety  Is the patient at risk to self? No  Has the patient been a risk to self in the past 6 months? No .    Has the patient been a risk to self within the distant past? No   Is the patient a risk to others? No   Has the patient been a risk to others in the past 6 months? No   Has the patient been a risk to others within the distant past? No   Past Medical History:  Past  Medical History:  Diagnosis Date  . Anxiety   . Arthritis    knees  . Asthma   . Chronic pain syndrome   . Complication of anesthesia    "STATES WAKES UP AND CANT BREATHE AND NEEDS BREATHING TREATMENT  IN PACU"  . Constipation due to opioid therapy   . Dysrhythmia    palpitations- "from thyroid"  . Fibromyalgia   . Fracture of right foot   . Gastric ulcer 2018  . GERD (gastroesophageal reflux disease)   . History of kidney stones    "inbedded"  . Migraine    Mirgraine-  . Myofacial muscle pain   . Obesity   . Occipital neuralgia 2016  . OSA (obstructive sleep apnea)    Has CPAP  . Sleep apnea     NO CPAP--could not tolerates mask  . Thyroid disease   . Viral respiratory infection 06/26/13   ? influenza    Past Surgical History:  Procedure Laterality Date  . COLONOSCOPY      x2  . ENDOMETRIAL ABLATION    . ENDOMETRIAL ABLATION  07/17/2017  . GALLBLADDER SURGERY  06/2016  . 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 HIP ARTHROSCOPY Right 05/10/2019   Procedure: Right hip arthroscopy with labrum repair and  femoroplasty;  Surgeon: Nicholes Stairs, MD;  Location: [facility];  Service: Orthopedics;  Laterality: Right;  2.5 hrs  . NASAL SEPTUM SURGERY    . THYROID LOBECTOMY Right 07/19/2013   Procedure: THYROID LOBECTOMY;  Surgeon: Harl Bowie, MD;  Location: [facility];  Service: General;  Laterality: Right;  . TUBAL LIGATION    . WISDOM TOOTH EXTRACTION      Family History:  Family History  Problem Relation Age of Onset  . Hypertension Mother   . Hypertension Father   . Prostate cancer Father   . Diabetes Sister   . Cancer Paternal Grandmother        breast    Social History:  Social History   Socioeconomic History  . Marital status: Legally Separated    Spouse name: Not on file  . Number of children: 1  . Years of education: GED  . Highest education level: Not on file  Occupational History  . Occupation: unemployed  Tobacco Use  . Smoking status: Current Every  Day Smoker    Packs/day: 0.50    Years: 0.00    Pack years: 0.00  Types: Cigarettes    Last attempt to quit: 11/2015    Years since quitting: 4.8  . Smokeless tobacco: Never Used  Vaping Use  . Vaping Use: Never used  Substance and Sexual Activity  . Alcohol use: Yes    Alcohol/week: 2.0 standard drinks    Types: 2 Glasses of wine per week  . Drug use: No  . Sexual activity: Not on file  Other Topics Concern  . Not on file  Social History Narrative   Lives with father   Caffeine use: rare   Right handed   Social Determinants of Health   Financial Resource Strain: Not on file  Food Insecurity: Not on file  Transportation Needs: Not on file  Physical Activity: Not on file  Stress: Not on file  Social Connections: Not on file  Intimate Partner Violence: Not on file    SDOH:  SDOH Screenings   Alcohol Screen: Not on file  Depression (HGD9-2): Not on file  Financial Resource Strain: Not on file  Food Insecurity: Not on file  Housing: Not on file  Physical Activity: Not on file  Social Connections: Not on file  Stress: Not on file  Tobacco Use: High Risk  . Smoking Tobacco Use: Current Every Day Smoker  . Smokeless Tobacco Use: Never Used  Transportation Needs: Not on file    Last Labs:  Admission on 09/01/2020  Component Date Value Ref Range Status  . POC Amphetamine UR 09/02/2020 None Detected  NONE DETECTED (Cut Off Level 1000 ng/mL) Final  . POC Secobarbital (BAR) 09/02/2020 None Detected  NONE DETECTED (Cut Off Level 300 ng/mL) Final  . POC Buprenorphine (BUP) 09/02/2020 None Detected  NONE DETECTED (Cut Off Level 10 ng/mL) Final  . POC Oxazepam (BZO) 09/02/2020 None Detected  NONE DETECTED (Cut Off Level 300 ng/mL) Final  . POC Cocaine UR 09/02/2020 None Detected  NONE DETECTED (Cut Off Level 300 ng/mL) Final  . POC Methamphetamine UR 09/02/2020 None Detected  NONE DETECTED (Cut Off Level 1000 ng/mL) Final  . POC Morphine 09/02/2020 None Detected  NONE  DETECTED (Cut Off Level 300 ng/mL) Final  . POC Oxycodone UR 09/02/2020 Positive* NONE DETECTED (Cut Off Level 100 ng/mL) Final  . POC Methadone UR 09/02/2020 None Detected  NONE DETECTED (Cut Off Level 300 ng/mL) Final  . POC Marijuana UR 09/02/2020 None Detected  NONE DETECTED (Cut Off Level 50 ng/mL) Final  . SARSCOV2ONAVIRUS 2 AG 09/02/2020 NEGATIVE  NEGATIVE Final   Comment: (NOTE) SARS-CoV-2 antigen NOT DETECTED.   Negative results are presumptive.  Negative results do not preclude SARS-CoV-2 infection and should not be used as the sole basis for treatment or other patient management decisions, including infection  control decisions, particularly in the presence of clinical signs and  symptoms consistent with COVID-19, or in those who have been in contact with the virus.  Negative results must be combined with clinical observations, patient history, and epidemiological information. The expected result is Negative.  Fact Sheet for Patients: HandmadeRecipes.com.cy  Fact Sheet for Healthcare Providers: FuneralLife.at  This test is not yet approved or cleared by the [place] FDA and  has been authorized for detection and/or diagnosis of SARS-CoV-2 by FDA under an Emergency Use Authorization (EUA).  This EUA will remain in effect (meaning this test can be used) for the duration of  the COV  ID-19 declaration under Section 564(b)(1) of the Act, 21 U.S.C. section 360bbb-3(b)(1), unless the authorization is terminated or revoked sooner.    . Preg Test, Ur 09/02/2020 NEGATIVE  NEGATIVE Final   Comment:        THE SENSITIVITY OF THIS METHODOLOGY IS >24 mIU/mL   Admission on 08/28/2020, Discharged on 08/28/2020  Component Date Value Ref Range Status  . WBC 08/28/2020 6.0  4.0 - 10.5 K/uL Final  . RBC 08/28/2020 4.45  3.87 - 5.11 MIL/uL Final  . Hemoglobin 08/28/2020 12.5  12.0 - 15.0 g/dL Final  . HCT  08/28/2020 37.7  36.0 - 46.0 % Final  . MCV 08/28/2020 84.7  80.0 - 100.0 fL Final  . MCH 08/28/2020 28.1  26.0 - 34.0 pg Final  . MCHC 08/28/2020 33.2  30.0 - 36.0 g/dL Final  . RDW 08/28/2020 17.3* 11.5 - 15.5 % Final  . Platelets 08/28/2020 180  150 - 400 K/uL Final  . nRBC 08/28/2020 0.0  0.0 - 0.2 % Final   Performed at [facility], [place] [address] [place], [place] [address]  . Troponin I (High Sensitivity) 08/28/2020 29* <18 ng/L Final   Comment: (NOTE) Elevated high sensitivity troponin I (hsTnI) values and significant  changes across serial measurements may suggest ACS but many other  chronic and acute conditions are known to elevate hsTnI results.  Refer to the "Links" section for chest pain algorithms and additional  guidance. Performed at [facility], [place] [address], [place], [place] [address]   . Magnesium 08/28/2020 1.6* 1.7 - 2.4 mg/dL Final   Performed at [facility] [address], [place], [place] [address]  . Alcohol, Ethyl (B) 08/28/2020 <10  <10 mg/dL Final   Comment: (NOTE) Lowest detectable limit for serum alcohol is 10 mg/dL.  For medical purposes only. Performed at [facility], [place] [address], [place], [place] [address]   . TSH 08/28/2020 3.471  0.350 - 4.500 uIU/mL Final   Comment: Performed by a 3rd Generation assay with a functional sensitivity of <=0.01 uIU/mL. Performed at [facility], [place] [address] [place], [place] [address]   . Fecal Occult Bld 08/28/2020 NEGATIVE  NEGATIVE Final  . Sodium 08/28/2020 135  135 - 145 mmol/L Final  . Potassium 08/28/2020 3.0* 3.5 - 5.1 mmol/L Final  . Chloride 08/28/2020 103  98 - 111 mmol/L Final  . CO2 08/28/2020 23  22 - 32 mmol/L Final  . Glucose, Bld 08/28/2020 116* 70 - 99 mg/dL Final   Glucose reference range applies only to samples taken after fasting for at least 8 hours.  . BUN 08/28/2020 8  6 - 20 mg/dL Final  . Creatinine, Ser 08/28/2020 0.66  0.44 - 1.00 mg/dL Final   . Calcium 08/28/2020 8.0* 8.9 - 10.3 mg/dL Final  . Total Protein 08/28/2020 6.2* 6.5 - 8.1 g/dL Final  . Albumin 08/28/2020 2.8* 3.5 - 5.0 g/dL Final  . AST 08/28/2020 72* 15 - 41 U/L Final  . ALT 08/28/2020 50* 0 - 44 U/L Final  . Alkaline Phosphatase 08/28/2020 112  38 - 126 U/L Final  . Total Bilirubin 08/28/2020 0.9  0.3 - 1.2 mg/dL Final  . GFR, Estimated 08/28/2020 >60  >60 mL/min Final   Comment: (NOTE) Calculated using the CKD-EPI Creatinine Equation (2021)   . Anion gap 08/28/2020 9  5 - 15 Final   Performed at [facility] [address], [place], [place]  [id number]  . Troponin I (High Sensitivity) 08/28/2020 25* <18 ng/L Final   Comment: (NOTE) Elevated high sensitivity troponin I (hsTnI) values and significant  changes across serial measurements may suggest ACS but many other  chronic and acute conditions are known to elevate hsTnI results.  Refer to the "Links" section for chest pain algorithms and additional  guidance. Performed at [facility], [place] [address], [place], [place] [address]     Allergies: Beta adrenergic blockers, Nsaids, and Tolmetin  PTA Medications: (Not in a hospital admission)   Medical Decision Making  Admit patient to [facility] for overnight observation with continuous assessment. Patient will be reassessed by psychiatry on 09/02/2020 to determine appropriate disposition.  -Recent EKG with prolong QT, hold off on anti-psychotic medication. recheck EKG. -patient unable to verify current  home medication, day shift provider to follow up      Recommendations  Based on my evaluation the patient does not appear to have an emergency medical condition.  Ophelia Shoulder, NP 09/02/20  4:53 AM

## 2020-09-02 NOTE — ED Notes (Signed)
Returned form [facility].  

## 2020-09-02 NOTE — ED Notes (Signed)
Pt is confused and keep saying  family had came and brought hygiene item and this is not true. . Pt was offer the hygiene items her and pt declined.

## 2020-09-02 NOTE — ED Notes (Signed)
Pt requesting pain meds and something to calm her down advised would nee to speak with provider reference same. Message sent to provider

## 2020-09-02 NOTE — ED Provider Notes (Signed)
[facility] EMERGENCY DEPARTMENT Provider Note   CSN: [id number] Arrival date & time: 09/02/20  1330     History Chief Complaint  Patient presents with  . IVC    Cindy Hoffman is a 45 y.o. female.  HPI   Patient presents to the ED for further evaluation after being referred from behavioral health.  Patient has a history of seizure disorder as well as anxiety.  She was involuntary committed by her father yesterday due to hallucinations.  According to the behavioral health note patient has not been compliant with her medications the past week.  According to the notes patient was talking to her dead sister.  Patient is dismissive of the hallucinations.  She feels it was because she was just dehydrated.  She denies having any hallucinations recently.  Patient denies any suicidal or homicidal ideation.  Patient was evaluated by the mental health team and her IVC was rescinded.  We recommended an MRI and EEG.  I also recommended replacement of her hypokalemia.  Patient did have a full set of laboratory test performed earlier this morning.  Past Medical History:  Diagnosis Date  . Anxiety   . Arthritis    knees  . Asthma   . Chronic pain syndrome   . Complication of anesthesia    "STATES WAKES UP AND CANT BREATHE AND NEEDS BREATHING TREATMENT  IN PACU"  . Constipation due to opioid therapy   . Dysrhythmia    palpitations- "from thyroid"  . Fibromyalgia   . Fracture of right foot   . Gastric ulcer 2018  . GERD (gastroesophageal reflux disease)   . History of kidney stones    "inbedded"  . Migraine    Mirgraine-  . Myofacial muscle pain   . Obesity   . Occipital neuralgia 2016  . OSA (obstructive sleep apnea)    Has CPAP  . Sleep apnea     NO CPAP--could not tolerates mask  . Thyroid disease   . Viral respiratory infection 06/26/13   ? influenza    Patient Active Problem List   Diagnosis Date Noted  . Labral tear of hip joint 05/10/2019  . Chronic low  back pain 08/30/2017  . Chronic pain of both knees 08/30/2017  . Chronic migraine without aura, with intractable migraine, so stated, with status migrainosus 03/06/2017  . Intractable chronic migraine without aura and without status migrainosus 09/29/2016  . Occipital neuralgia 12/12/2015  . Thyroid nodule 07/19/2013  . Right thyroid nodule 06/18/2013  . Morbid obesity ([place]) 05/01/2013  . Asthma, chronic 02/01/2013  . Smoker 02/01/2013    Past Surgical History:  Procedure Laterality Date  . COLONOSCOPY      x2  . ENDOMETRIAL ABLATION    . ENDOMETRIAL ABLATION  07/17/2017  . GALLBLADDER SURGERY  06/2016  . 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 HIP ARTHROSCOPY Right 05/10/2019   Procedure: Right hip arthroscopy with labrum repair and  femoroplasty;  Surgeon: Nicholes Stairs, MD;  Location: [facility];  Service: Orthopedics;  Laterality: Right;  2.5 hrs  . NASAL SEPTUM SURGERY    . THYROID LOBECTOMY Right 07/19/2013   Procedure: THYROID LOBECTOMY;  Surgeon: Harl Bowie, MD;  Location: [facility];  Service: General;  Laterality: Right;  . TUBAL LIGATION    . WISDOM TOOTH EXTRACTION       OB History   No obstetric history on file.     Family History  Problem Relation Age of Onset  . Hypertension  Mother   . Hypertension Father   . Prostate cancer Father   . Diabetes Sister   . Cancer Paternal Grandmother        breast    Social History   Tobacco Use  . Smoking status: Current Every Day Smoker    Packs/day: 0.50    Years: 0.00    Pack years: 0.00    Types: Cigarettes    Last attempt to quit: 11/2015    Years since quitting: 4.8  . Smokeless tobacco: Never Used  Vaping Use  . Vaping Use: Never used  Substance Use Topics  . Alcohol use: Yes    Alcohol/week: 2.0 standard drinks    Types: 2 Glasses of wine per week  . Drug use: No    Home Medications Prior to Admission medications   Medication Sig Start Date End Date Taking? Authorizing Provider  albuterol (PROVENTIL  HFA;VENTOLIN HFA) 108 (90 BASE) MCG/ACT inhaler Inhale 2 puffs into the lungs every 6 (six) hours as needed for wheezing or shortness of breath.     [provider]  albuterol (PROVENTIL) (2.5 MG/3ML) 0.083% nebulizer solution Take 2.5 mg by nebulization every 6 (six) hours as needed for wheezing or shortness of breath.    [provider]  amitriptyline (ELAVIL) 25 MG tablet TAKE 4 TABLETS (100 MG TOTAL) BY MOUTH AT BEDTIME. Patient not taking: Reported on 09/02/2020 01/01/20   Lomax, Amy, NP  baclofen (LIORESAL) 10 MG tablet TAKE 1 TABS BY MOUTH 3 TIMES DAILY AS NEEDED FOR MUSCLE SPASMS. PLEASE SPECIFY DIRECTIONS. Patient not taking: Reported on 09/02/2020 06/28/20   Melvenia Beam, MD  dicyclomine (BENTYL) 20 MG tablet Take 20 mg by mouth 3 (three) times daily as needed (abdominal spasms.).  Patient not taking: Reported on 09/02/2020 09/10/16   [provider]  escitalopram (LEXAPRO) 20 MG tablet Take 20 mg by mouth daily. Patient not taking: Reported on 09/02/2020 04/28/19   [provider]  esomeprazole (NEXIUM) 40 MG capsule Take 40 mg by mouth 2 (two) times daily. Patient not taking: Reported on 09/02/2020    [provider]  furosemide (LASIX) 20 MG tablet Take 2 tablets (40 mg total) by mouth daily. Patient not taking: Reported on 09/02/2020 10/03/14   Etta Quill, NP  gabapentin (NEURONTIN) 250 MG/5ML solution Take 900 mg by mouth 3 (three) times daily. Patient not taking: Reported on 09/02/2020    [provider]  hydrOXYzine (ATARAX/VISTARIL) 25 MG tablet Take 25 mg by mouth 2 (two) times daily as needed for anxiety. Patient not taking: Reported on 09/02/2020 12/29/18   [provider]  iron polysaccharides (NIFEREX) 150 MG capsule Take 150 mg by mouth 2 (two) times daily. Patient not taking: Reported on 09/02/2020    [provider]  LORazepam (ATIVAN) 0.5 MG tablet Take 0.5 mg by mouth daily as needed for anxiety. Patient not  taking: Reported on 09/02/2020    [provider]  methocarbamol (ROBAXIN) 500 MG tablet Take 1 tablet (500 mg total) by mouth every 6 (six) hours as needed for muscle spasms. Patient not taking: Reported on 09/02/2020 05/10/19   Nicholes Stairs, MD  mometasone-formoterol [facility]) 100-5 MCG/ACT AERO Inhale 2 puffs into the lungs 2 (two) times daily. Patient not taking: Reported on 09/02/2020 02/24/17   [provider]  MOVANTIK 25 MG TABS tablet Take 25 mg by mouth at bedtime. Patient not taking: Reported on 09/02/2020 04/12/19   [provider]  oxyCODONE ER 13.5 MG C12A  Take 13.5 mg by mouth 2 (two) times daily. Patient not taking: Reported on 09/02/2020    [provider]  Oxycodone HCl 10 MG TABS Take 10 mg by mouth 3 (three) times daily. Patient not taking: Reported on 09/02/2020 04/05/19   [provider]  potassium chloride SA (KLOR-CON) 20 MEQ tablet Take 1 tablet (20 mEq total) by mouth daily for 7 days. 08/28/20 09/04/20  Sherrill Raring, PA-C  tiZANidine (ZANAFLEX) 4 MG tablet TAKE 1-2 TABLETS (4-8 MG TOTAL) BY MOUTH EVERY 8 (EIGHT) HOURS AS NEEDED FOR MUSCLE SPASMS. 07/27/20   Melvenia Beam, MD  topiramate (TOPAMAX) 200 MG tablet TAKE 1 TABLET (200 MG TOTAL) BY MOUTH AT BEDTIME. Patient not taking: Reported on 09/02/2020 01/21/20   Ward Givens, NP    Allergies    Beta adrenergic blockers, Nsaids, and Tolmetin  Review of Systems   Review of Systems  All other systems reviewed and are negative.   Physical Exam Updated Vital Signs BP 120/85 (BP Location: Right Arm)   Pulse 80   Temp 98.5 F (36.9 C) (Oral)   Resp 18   SpO2 100%   Physical Exam Vitals and nursing note reviewed.  Constitutional:      General: She is not in acute distress.    Appearance: She is well-developed.  HENT:     Head: Normocephalic and atraumatic.     Right Ear: External ear normal.     Left Ear: External ear normal.  Eyes:     General: No scleral icterus.        Right eye: No discharge.        Left eye: No discharge.     Conjunctiva/sclera: Conjunctivae normal.  Neck:     Trachea: No tracheal deviation.  Cardiovascular:     Rate and Rhythm: Normal rate and regular rhythm.  Pulmonary:     Effort: Pulmonary effort is normal. No respiratory distress.     Breath sounds: Normal breath sounds. No stridor. No wheezing or rales.  Abdominal:     General: Bowel sounds are normal. There is no distension.     Palpations: Abdomen is soft.     Tenderness: There is no abdominal tenderness. There is no guarding or rebound.  Musculoskeletal:        General: No tenderness.     Cervical back: Neck supple.  Skin:    General: Skin is warm and dry.     Findings: No rash.  Neurological:     Mental Status: She is alert.     Cranial Nerves: No cranial nerve deficit (no facial droop, extraocular movements intact, no slurred speech).     Sensory: No sensory deficit.     Motor: No abnormal muscle tone or seizure activity.     Coordination: Coordination normal.     ED Results / Procedures / Treatments   Labs (all labs ordered are listed, but only abnormal results are displayed) Labs Reviewed  CSF CULTURE W GRAM STAIN  MAGNESIUM  IGG CSF INDEX  DRAW EXTRA CLOT TUBE  OLIGOCLONAL BANDS, CSF + SERM  CSF CELL COUNT WITH DIFFERENTIAL  CSF CELL COUNT WITH DIFFERENTIAL  PROTEIN AND GLUCOSE, CSF  Labs reviewed from early this morning at the mental [place].  She had normal CBC except for mild anemia at 11.7.  Metabolic panel was notable for potassium decreased to 2.9.  Patient's ethanol and TSH were normal.  B12 is pending.  Magnesium was drawn today and is normal range. EKG None  Radiology MR Brain W and Wo Contrast  Result Date: 09/02/2020 CLINICAL DATA:  Acute mental status change EXAM: MRI HEAD WITHOUT AND WITH CONTRAST TECHNIQUE: Multiplanar, multiecho pulse sequences of the brain and surrounding structures were obtained without and with intravenous  contrast. CONTRAST:  23mL GADAVIST GADOBUTROL 1 MMOL/ML IV SOLN COMPARISON:  CT head 02/05/2020.  MRI head 03/31/2015 FINDINGS: Brain: Multiple patchy ill-defined areas of white matter hyperintensity bilaterally most prominent in the left frontal lobe and left occipital parietal lobe. This has progressed since 2016. Small focal areas of white matter hyperintensity bilaterally are mild and progressive from the prior study. Ventricle size normal. Cerebral volume normal. No areas of restricted diffusion. Negative for hemorrhage or mass. Normal brainstem. Normal enhancement. Vascular: Normal arterial flow voids. Skull and upper cervical spine: No focal skeletal abnormality. Sinuses/Orbits: Normal orbit.  Paranasal sinuses clear. Other: None IMPRESSION: Ill-defined areas of white matter hyperintensity bilaterally left greater than right, with progression since 2016. Differential diagnosis includes demyelinating disease, acute or chronic encephalomyelitis, PML. Negative for acute infarct.  No enhancing lesion identified. Electronically Signed   By: Franchot Gallo M.D.   On: 09/02/2020 17:11    Procedures Procedures   Medications Ordered in ED Medications  potassium chloride SA (KLOR-CON) CR tablet 40 mEq (40 mEq Oral Given 09/02/20 1713)  gadobutrol (GADAVIST) 1 MMOL/ML injection 8 mL (8 mLs Intravenous Contrast Given 09/02/20 1626)  acetaminophen (TYLENOL) tablet 650 mg (650 mg Oral Given 09/02/20 1729)  lidocaine (XYLOCAINE) 2 % (with pres) injection 200 mg (200 mg Intradermal Given 09/02/20 1850)    ED Course  I have reviewed the triage vital signs and the nursing notes.  Pertinent labs & imaging results that were available during my care of the patient were reviewed by me and considered in my medical decision making (see chart for details).  Clinical Course as of 09/02/20 1906  Tue Sep 02, 2020  1556 Pt is not having any active seizure symptoms here or recently.  Discussed with Dr Cheral Marker.  Does not feel  emergent EEG is necessary [JK]  1747 Discussed with Dr. Cheral Marker.  Patient would benefit from LP and admission for further work-up [JK]    Clinical Course User Index [JK] Dorie Rank, MD   MDM Rules/Calculators/A&P                          Patient's MRI shows progressive abnormalities compared to previous imaging noted in 2016.  Differential for this includes MS, encephalitis, PML.  Patient is not showing any acute infectious symptoms.  I discussed case with neurology.  Pt had an LP performed in the ED by PA Rona Ravens.  Clear fluid sent for analysis.  Will consult with medical service for admission, further workup. Final Clinical Impression(s) / ED Diagnoses Final diagnoses:  Hallucination  Abnormal MRI      Dorie Rank, MD 09/02/20 1906

## 2020-09-02 NOTE — Progress Notes (Signed)
Report called to CN, [place] at [facility] ED, Non emergency EMS called and responded. The approppriate  paperwork and her personal belongings was sent with her to [facility] ED. [employer] was called to escort her to [facility] since she is IVC.

## 2020-09-02 NOTE — ED Notes (Signed)
Patient transported to MRI 

## 2020-09-02 NOTE — ED Notes (Signed)
Eating per MD ok.

## 2020-09-02 NOTE — Progress Notes (Signed)
EEG complete - results pending 

## 2020-09-02 NOTE — BH Assessment (Addendum)
Comprehensive Clinical Assessment (CCA) Note  09/02/2020 Cindy Hoffman [id number] Disposition: Clinician saw patient.  Pt was also seen by Leandro Reasoner, NP who performed her MSE.  Patient was placed on IVC by a family member.  Pt will be continually assessed overnight at [facility].  Pt says she does not know why she is at [facility]. Pt denies any SI, no HI, Pt denies any A/V hallucinations. Pt says she has had some depression in the past. she says she does not take any medicine anymore. She has been off mediation for 2 weeks. Was taking lexapro and tazanadine.  Patient denies using any ETOH or other substances.  Patient was observed by Ene to be responding to internal stimuli.  She is guarded in her demeanor.  Pt has good eye contact but is restless, getting up at times saying that the seat is too warm.  Pt does say she has her prescriptions refilled by her PCP.  Pt reports normal appetite and sleep.  She does admit to not sleeping in the last day.  Pt gives permission to talk to father.  Pt and her 44 year old child live with her father.    Father said that patient had to go to [facility] on Friday or Saturday due to her blood pressure being low.  He said that she has been drinking vodka "and a lot of it."    He said that patient was for the last three days has been not sleeping.  Talking to herself and talking to her deceased sister.  Patient talked about "God is watching" and saying she is seeing things not there.  He says that she, yesterday, had put a bunch of knives around the house.  He said she has not threatened anyone with them.  She told him earlier today not to go to the bathroom "because they are having sex in it."  He said that the bathroom was empty.    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  She knows that she has to stay away from ETOH but she is bringing it home and "drinking it straight out."  He thinks that she has been drinking without him knowing for the past  year.  Father said that there were about 15 bottles of vodka in her room.    He said that she has been walking up and down the street pointing at people not there.  He said she has not been sleeping well over the last few days.    Pt has no outpatient psychiatric care and has no previous inpatient care.    Chief Complaint: No chief complaint on file.  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 Legal System (Pt says the police brought her in.)  Referral name: No data recorded Referral phone number: No data recorded  Whom do you see for routine medical problems? Primary Care  Practice/Facility Name: [facility].  Practice/Facility Phone Number: No data recorded Name of Contact: Dr. Collier Flowers Number: No data recorded Contact Fax Number: No data recorded Prescriber Name: No data recorded Prescriber Address (if known): No data recorded  What Is the Reason for Your Visit/Call Today? Pt says she does not know why she is at [facility].  Pt denies any SI,  no HI, Pt denies any A/V hallucinations.  Pt says she has had some depression  in the past.  she says she does not take any medicine anymore.  She has been off mediation for 2 weeks.  Was taking lexapro and tazanadine.  How Long Has This Been Causing You Problems? 1 wk - 1 month  What Do You Feel Would Help You the Most Today? Treatment for Depression or other mood problem   Have You Recently Been in Any Inpatient Treatment (Hospital/Detox/Crisis Center/28-Day Program)? No  Name/Location of Program/Hospital:No data recorded How Long Were You There? No data recorded When Were You Discharged? No data recorded  Have You Ever Received Services From [facility] Before? Yes  Who Do You See at [facility]? Ed visits   Have You Recently Had Any Thoughts About Hurting Yourself? No  Are You Planning to Commit  Suicide/Harm Yourself At This time? No   Have you Recently Had Thoughts About Hurting Someone [name]? No  Explanation: No data recorded  Have You Used Any Alcohol or Drugs in the Past 24 Hours? No  How Long Ago Did You Use Drugs or Alcohol? No data recorded What Did You Use and How Much? No data recorded  Do You Currently Have a Therapist/Psychiatrist? No  Name of Therapist/Psychiatrist: No data recorded  Have You Been Recently Discharged From Any Office Practice or Programs? No  Explanation of Discharge From Practice/Program: No data recorded    CCA Screening Triage Referral Assessment Type of Contact: Face-to-Face  Is this Initial or Reassessment? No data recorded Date Telepsych consult ordered in [facility]:  No data recorded Time Telepsych consult ordered in [facility]:  No data recorded  Patient Reported Information Reviewed? Yes  Patient Left Without Being Seen? No data recorded Reason for Not Completing Assessment: No data recorded  Collateral Involvement: No data recorded  Does Patient Have a Court Appointed Legal Guardian? No data recorded Name and Contact of Legal Guardian: No data recorded If Minor and Not Living with Parent(s), Who has Custody? No data recorded Is CPS involved or ever been involved? No data recorded Is APS involved or ever been involved? Never   Patient Determined To Be At Risk for Harm To Self or Others Based on Review of Patient Reported Information or Presenting Complaint? No  Method: No data recorded Availability of Means: No data recorded Intent: No data recorded Notification Required: No data recorded Additional Information for Danger to Others Potential: No data recorded Additional Comments for Danger to Others Potential: No data recorded Are There Guns or Other Weapons in Your Home? No data recorded Types of Guns/Weapons: No data recorded Are These Weapons Safely Secured?                            No data recorded Who Could Verify You Are  Able To Have These Secured: No data recorded Do You Have any Outstanding Charges, Pending Court Dates, Parole/Probation? No data recorded Contacted To Inform of Risk of Harm To Self or Others: Other: Comment   Location of Assessment: [facility]   Does Patient Present under Involuntary Commitment? Yes  IVC Papers Initial File Date: 09/01/2020   [place] of Residence: [place]   Patient Currently Receiving the Following Services: Not Receiving Services   Determination of Need: Emergent (2 hours)   Options For Referral: Other: Comment (Patient to be continually assessed at [facility] tonight.  IVC to be reviewd on 05/03.)     CCA Biopsychosocial Intake/Chief Complaint:  Patient said she was brought to [facility]  by police.  She doe snot know why she is here.  Patient said that she "was feeling hot and sitting in a chair."  Pt was observed by NP Ene to be talking to herself.  Patient denies any SI, HI or A/V hallucinations.  Pt IVC'ed by family member.  Current Symptoms/Problems: Pt presented on IVC.   Patient Reported Schizophrenia/Schizoaffective Diagnosis in Past: No   Strengths: No data recorded Preferences: No data recorded Abilities: No data recorded  Type of Services Patient Feels are Needed: No data recorded  Initial Clinical Notes/Concerns: No data recorded  Mental Health Symptoms Depression:  None   Duration of Depressive symptoms: No data recorded  Mania:  None   Anxiety:   Irritability; Tension; Worrying   Psychosis:  -- (Pt denies any A/V hallucinations.)   Duration of Psychotic symptoms: No data recorded  Trauma:  None   Obsessions:  None   Compulsions:  None   Inattention:  None   Hyperactivity/Impulsivity:  N/A   Oppositional/Defiant Behaviors:  None   Emotional Irregularity:  None   Other Mood/Personality Symptoms:  No data recorded   Mental Status Exam Appearance and self-care  Stature:  Average   Weight:  Average weight    Clothing:  Casual   Grooming:  Normal   Cosmetic use:  None   Posture/gait:  Stooped   Motor activity:  Restless   Sensorium  Attention:  Normal   Concentration:  Scattered   Orientation:  No data recorded  Recall/memory:  Defective in Short-term   Affect and Mood  Affect:  Anxious   Mood:  Anxious   Relating  Eye contact:  Normal   Facial expression:  Anxious   Attitude toward examiner:  Guarded   Thought and Language  Speech flow: Garbled; Soft   Thought content:  Appropriate to Mood and Circumstances   Preoccupation:  Ruminations   Hallucinations:  Auditory (Pt denying but she is responding to internal stimuli.)   Organization:  No data recorded  [employer] of Knowledge:  Average   Intelligence:  Average   Abstraction:  No data recorded  Judgement:  Poor   Reality Testing:  Distorted   Insight:  Poor   Decision Making:  Confused   Social Functioning  Social Maturity:  Isolates   Social Judgement:  Normal   Stress  Stressors:  Illness (Migraines)   Coping Ability:  Overwhelmed   Skill Deficits:  Communication; Interpersonal   Supports:  Family     Religion:    Leisure/Recreation:    Exercise/Diet: Exercise/Diet Have You Gained or Lost A Significant Amount of Weight in the Past Six Months?: No Do You Have Any Trouble Sleeping?: No   CCA Employment/Education Employment/Work Situation: Employment / Work Situation Employment situation: On disability Why is patient on disability: [employer] How long has patient been on disability: 2 years Has patient ever been in the [employer]?: No  Education: Education Did Teacher, adult education From [employer]?: Yes   CCA Family/Childhood History Family and Relationship History: Family history Marital status: Single Does patient have children?: Yes How many children?: 1  Childhood History:  Childhood History By whom was/is the patient raised?: Father Does patient have  siblings?: Yes Number of Siblings: 2 Did patient suffer any verbal/emotional/physical/sexual abuse as a child?: No Did patient suffer from severe childhood neglect?: No Has patient ever been sexually abused/assaulted/raped as an adolescent or adult?: No Was the patient ever a victim of a crime or a disaster?: No Witnessed  domestic violence?: No Has patient been affected by domestic violence as an adult?: No  Child/Adolescent Assessment:     CCA Substance Use Alcohol/Drug Use: Alcohol / Drug Use Pain Medications: None Prescriptions: Lexapro tazaniane?  Has been off for 2 weeks. Over the Counter: None History of alcohol / drug use?: No history of alcohol / drug abuse                         ASAM's:  Six Dimensions of Multidimensional Assessment  Dimension 1:  Acute Intoxication and/or Withdrawal Potential:      Dimension 2:  Biomedical Conditions and Complications:      Dimension 3:  Emotional, Behavioral, or Cognitive Conditions and Complications:     Dimension 4:  Readiness to Change:     Dimension 5:  Relapse, Continued use, or Continued Problem Potential:     Dimension 6:  Recovery/Living Environment:     ASAM Severity Score:    ASAM Recommended Level of Treatment:     Substance use Disorder (SUD)    Recommendations for Services/Supports/Treatments:    DSM5 Diagnoses: Patient Active Problem List   Diagnosis Date Noted  . Labral tear of hip joint 05/10/2019  . Chronic low back pain 08/30/2017  . Chronic pain of both knees 08/30/2017  . Chronic migraine without aura, with intractable migraine, so stated, with status migrainosus 03/06/2017  . Intractable chronic migraine without aura and without status migrainosus 09/29/2016  . Occipital neuralgia 12/12/2015  . Thyroid nodule 07/19/2013  . Right thyroid nodule 06/18/2013  . Morbid obesity ([place]) 05/01/2013  . Asthma, chronic 02/01/2013  . Smoker 02/01/2013    Patient Centered Plan: Patient is on the  following Treatment Plan(s):  Anxiety, Depression and Substance Abuse   Referrals to Alternative Service(s): Referred to Alternative Service(s):   Place:   Date:   Time:    Referred to Alternative Service(s):   Place:   Date:   Time:    Referred to Alternative Service(s):   Place:   Date:   Time:    Referred to Alternative Service(s):   Place:   Date:   Time:     [place] Session

## 2020-09-02 NOTE — ED Notes (Signed)
ED Provider at bedside. 

## 2020-09-02 NOTE — H&P (Addendum)
History and Physical    MAURICA BELZ [id number] DOB: [birth year] DOA: 09/02/2020  PCP: Berkley Harvey, NP  Patient coming from: Home  I have personally briefly reviewed patient's old medical records in [place] [facility]  Chief Complaint: AMS  HPI: ANALIS Hoffman is a 45 y.o. female with medical history significant of CPS, obesity, asthma.  Pt with hallucinations and seizures since Feb 7th.  H/o Seizures in past but not for a long time prior to fall on Feb 7th.  Pt also having increased blurred vision since Feb.  Behavioral changes noted including suddenly stopping all of her home meds this past week.  Pt father concerned about hallucinations and pt talking to dead sister and so had patient IVCd to [facility].  [facility] concerned pt may have something medical going on and so sent pt to ED.  No fevers, chills, SI, HI, current hallucinations.  Pt initially dismissive of the hallucinations saying she was just dehydrated.   ED Course: MRI brain shows bilateral abnormalities in white matter: DDx includes demyelinating dz, acute or chronic encephalomyelitis, PML.  Neuro consulted.  LP performed.   Review of Systems: As per HPI, otherwise all review of systems negative.  Past Medical History:  Diagnosis Date  . Anxiety   . Arthritis    knees  . Asthma   . Chronic pain syndrome   . Complication of anesthesia    "STATES WAKES UP AND CANT BREATHE AND NEEDS BREATHING TREATMENT  IN PACU"  . Constipation due to opioid therapy   . Dysrhythmia    palpitations- "from thyroid"  . Fibromyalgia   . Fracture of right foot   . Gastric ulcer 2018  . GERD (gastroesophageal reflux disease)   . History of kidney stones    "inbedded"  . Migraine    Mirgraine-  . Myofacial muscle pain   . Obesity   . Occipital neuralgia 2016  . OSA (obstructive sleep apnea)    Has CPAP  . Sleep apnea     NO CPAP--could not tolerates mask  . Thyroid disease   . Viral respiratory infection 06/26/13   ?  influenza    Past Surgical History:  Procedure Laterality Date  . COLONOSCOPY      x2  . ENDOMETRIAL ABLATION    . ENDOMETRIAL ABLATION  07/17/2017  . GALLBLADDER SURGERY  06/2016  . 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 HIP ARTHROSCOPY Right 05/10/2019   Procedure: Right hip arthroscopy with labrum repair and  femoroplasty;  Surgeon: Nicholes Stairs, MD;  Location: [facility];  Service: Orthopedics;  Laterality: Right;  2.5 hrs  . NASAL SEPTUM SURGERY    . THYROID LOBECTOMY Right 07/19/2013   Procedure: THYROID LOBECTOMY;  Surgeon: Harl Bowie, MD;  Location: [facility];  Service: General;  Laterality: Right;  . TUBAL LIGATION    . WISDOM TOOTH EXTRACTION       reports that she has been smoking cigarettes. She has been smoking about 0.50 packs per day for the past 0.00 years. She has never used smokeless tobacco. She reports current alcohol use of about 2.0 standard drinks of alcohol per week. She reports that she does not use drugs.  Allergies  Allergen Reactions  . Beta Adrenergic Blockers Anaphylaxis  . Nsaids     Other reaction(s): Other Contraindicated due to bariatric surgery  . Tolmetin Other (See Comments)    Contraindicated due to bariatric surgery Contraindicated due to bariatric surgery    Family History  Problem  Relation Age of Onset  . Hypertension Mother   . Hypertension Father   . Prostate cancer Father   . Diabetes Sister   . Cancer Paternal Grandmother        breast     Prior to Admission medications   Medication Sig Start Date End Date Taking? Authorizing Provider  albuterol (PROVENTIL HFA;VENTOLIN HFA) 108 (90 BASE) MCG/ACT inhaler Inhale 2 puffs into the lungs every 6 (six) hours as needed for wheezing or shortness of breath.     [provider]  albuterol (PROVENTIL) (2.5 MG/3ML) 0.083% nebulizer solution Take 2.5 mg by nebulization every 6 (six) hours as needed for wheezing or shortness of breath.    [provider]  amitriptyline  (ELAVIL) 25 MG tablet TAKE 4 TABLETS (100 MG TOTAL) BY MOUTH AT BEDTIME. Patient not taking: Reported on 09/02/2020 01/01/20   Lomax, Amy, NP  baclofen (LIORESAL) 10 MG tablet TAKE 1 TABS BY MOUTH 3 TIMES DAILY AS NEEDED FOR MUSCLE SPASMS. PLEASE SPECIFY DIRECTIONS. Patient not taking: Reported on 09/02/2020 06/28/20   Melvenia Beam, MD  dicyclomine (BENTYL) 20 MG tablet Take 20 mg by mouth 3 (three) times daily as needed (abdominal spasms.).  Patient not taking: Reported on 09/02/2020 09/10/16   [provider]  escitalopram (LEXAPRO) 20 MG tablet Take 20 mg by mouth daily. Patient not taking: Reported on 09/02/2020 04/28/19   [provider]  esomeprazole (NEXIUM) 40 MG capsule Take 40 mg by mouth 2 (two) times daily. Patient not taking: Reported on 09/02/2020    [provider]  furosemide (LASIX) 20 MG tablet Take 2 tablets (40 mg total) by mouth daily. Patient not taking: Reported on 09/02/2020 10/03/14   Etta Quill, NP  gabapentin (NEURONTIN) 250 MG/5ML solution Take 900 mg by mouth 3 (three) times daily. Patient not taking: Reported on 09/02/2020    [provider]  hydrOXYzine (ATARAX/VISTARIL) 25 MG tablet Take 25 mg by mouth 2 (two) times daily as needed for anxiety. Patient not taking: Reported on 09/02/2020 12/29/18   [provider]  iron polysaccharides (NIFEREX) 150 MG capsule Take 150 mg by mouth 2 (two) times daily. Patient not taking: Reported on 09/02/2020    [provider]  LORazepam (ATIVAN) 0.5 MG tablet Take 0.5 mg by mouth daily as needed for anxiety. Patient not taking: Reported on 09/02/2020    [provider]  methocarbamol (ROBAXIN) 500 MG tablet Take 1 tablet (500 mg total) by mouth every 6 (six) hours as needed for muscle spasms. Patient not taking: Reported on 09/02/2020 05/10/19   Nicholes Stairs, MD  mometasone-formoterol [facility]) 100-5 MCG/ACT AERO Inhale 2 puffs into the lungs 2 (two) times daily. Patient not  taking: Reported on 09/02/2020 02/24/17   [provider]  MOVANTIK 25 MG TABS tablet Take 25 mg by mouth at bedtime. Patient not taking: Reported on 09/02/2020 04/12/19   [provider]  oxyCODONE ER 13.5 MG C12A Take 13.5 mg by mouth 2 (two) times daily. Patient not taking: Reported on 09/02/2020    [provider]  Oxycodone HCl 10 MG TABS Take 10 mg by mouth 3 (three) times daily. Patient not taking: Reported on 09/02/2020 04/05/19   [provider]  potassium chloride SA (KLOR-CON) 20 MEQ tablet Take 1 tablet (20 mEq total) by mouth daily for 7 days. 08/28/20 09/04/20  Sherrill Raring, PA-C  tiZANidine (ZANAFLEX) 4 MG tablet TAKE 1-2 TABLETS (4-8 MG TOTAL) BY MOUTH EVERY 8 (EIGHT) HOURS AS  NEEDED FOR MUSCLE SPASMS. 07/27/20   Melvenia Beam, MD  topiramate (TOPAMAX) 200 MG tablet TAKE 1 TABLET (200 MG TOTAL) BY MOUTH AT BEDTIME. Patient not taking: Reported on 09/02/2020 01/21/20   Ward Givens, NP    Physical Exam: Vitals:   09/02/20 1333 09/02/20 1530 09/02/20 1600 09/02/20 2002  BP: 126/83  120/85 130/79  Pulse: 95 83 80 100  Resp: 18 20 18 17   Temp: 98.6 F (37 C) 98.6 F (37 C) 98.5 F (36.9 C)   TempSrc: Oral Oral Oral   SpO2: 96% 99% 100% 99%    Constitutional: NAD, calm, comfortable Eyes: PERRL, lids and conjunctivae normal ENMT: Mucous membranes are moist. Posterior pharynx clear of any exudate or lesions.Normal dentition.  Neck: normal, supple, no masses, no thyromegaly Respiratory: clear to auscultation bilaterally, no wheezing, no crackles. Normal respiratory effort. No accessory muscle use.  Cardiovascular: Regular rate and rhythm, no murmurs / rubs / gallops. No extremity edema. 2+ pedal pulses. No carotid bruits.  Abdomen: no tenderness, no masses palpated. No hepatosplenomegaly. Bowel sounds positive.  Musculoskeletal: no clubbing / cyanosis. No joint deformity upper and lower extremities. Good ROM, no contractures. Normal muscle tone.   Skin: no rashes, lesions, ulcers. No induration Neurologic: CN 2-12 grossly intact. Sensation intact, DTR normal. Strength 5/5 in all 4.  Psychiatric: Normal judgment and insight. Alert and oriented x 3. Normal mood.    Labs on Admission: I have personally reviewed following labs and imaging studies  CBC: Recent Labs  Lab 08/28/20 1918 09/02/20 0122  WBC 6.0 8.0  NEUTROABS  --  5.9  HGB 12.5 11.7*  HCT 37.7 34.9*  MCV 84.7 85.5  PLT 180 481   Basic Metabolic Panel: Recent Labs  Lab 08/28/20 1918 09/02/20 0122 09/02/20 1351  NA 135 133*  --   K 3.0* 2.9*  --   CL 103 98  --   CO2 23 24  --   GLUCOSE 116* 111*  --   BUN 8 14  --   CREATININE 0.66 0.56  --   CALCIUM 8.0* 8.7*  --   MG 1.6*  --  2.0   GFR: Estimated Creatinine Clearance: 92.8 mL/min (by C-G formula based on SCr of 0.56 mg/dL). Liver Function Tests: Recent Labs  Lab 08/28/20 1918 09/02/20 0122  AST 72* 25  ALT 50* 29  ALKPHOS 112 113  BILITOT 0.9 0.8  PROT 6.2* 7.0  ALBUMIN 2.8* 3.5   No results for input(s): LIPASE, AMYLASE in the last 168 hours. No results for input(s): AMMONIA in the last 168 hours. Coagulation Profile: No results for input(s): INR, PROTIME in the last 168 hours. Cardiac Enzymes: No results for input(s): CKTOTAL, CKMB, CKMBINDEX, TROPONINI in the last 168 hours. BNP (last 3 results) No results for input(s): PROBNP in the last 8760 hours. HbA1C: Recent Labs    09/02/20 0122  HGBA1C 5.4   CBG: No results for input(s): GLUCAP in the last 168 hours. Lipid Profile: Recent Labs    09/02/20 0122  CHOL 161  HDL 67  LDLCALC 71  TRIG 114  CHOLHDL 2.4   Thyroid Function Tests: Recent Labs    09/02/20 0122  TSH 2.182   Anemia Panel: No results for input(s): VITAMINB12, FOLATE, FERRITIN, TIBC, IRON, RETICCTPCT in the last 72 hours. Urine analysis:    Component Value Date/Time   COLORURINE YELLOW 01/04/2015 1415   APPEARANCEUR CLEAR 01/04/2015 1415   LABSPEC  1.013 01/04/2015 1415   PHURINE 7.5 01/04/2015  [place] 01/04/2015 1415   HGBUR TRACE (A) 01/04/2015 1415   BILIRUBINUR NEGATIVE 01/04/2015 1415   KETONESUR NEGATIVE 01/04/2015 1415   PROTEINUR NEGATIVE 01/04/2015 1415   UROBILINOGEN 0.2 01/04/2015 1415   NITRITE NEGATIVE 01/04/2015 1415   LEUKOCYTESUR SMALL (A) 01/04/2015 1415    Radiological Exams on Admission: MR Brain W and Wo Contrast  Result Date: 09/02/2020 CLINICAL DATA:  Acute mental status change EXAM: MRI HEAD WITHOUT AND WITH CONTRAST TECHNIQUE: Multiplanar, multiecho pulse sequences of the brain and surrounding structures were obtained without and with intravenous contrast. CONTRAST:  49mL GADAVIST GADOBUTROL 1 MMOL/ML IV SOLN COMPARISON:  CT head 02/05/2020.  MRI head 03/31/2015 FINDINGS: Brain: Multiple patchy ill-defined areas of white matter hyperintensity bilaterally most prominent in the left frontal lobe and left occipital parietal lobe. This has progressed since 2016. Small focal areas of white matter hyperintensity bilaterally are mild and progressive from the prior study. Ventricle size normal. Cerebral volume normal. No areas of restricted diffusion. Negative for hemorrhage or mass. Normal brainstem. Normal enhancement. Vascular: Normal arterial flow voids. Skull and upper cervical spine: No focal skeletal abnormality. Sinuses/Orbits: Normal orbit.  Paranasal sinuses clear. Other: None IMPRESSION: Ill-defined areas of white matter hyperintensity bilaterally left greater than right, with progression since 2016. Differential diagnosis includes demyelinating disease, acute or chronic encephalomyelitis, PML. Negative for acute infarct.  No enhancing lesion identified. Electronically Signed   By: Franchot Gallo M.D.   On: 09/02/2020 17:11    EKG: Independently reviewed.  Assessment/Plan Principal Problem:   MRI of brain abnormal Active Problems:   Hallucinations   Encephalopathy   Seizure disorder ([place])     1. Abnormal MRI of brain, encephalopathy, hallucinations - 1. DDx includes demyelinating dz, acute or chronic encephalitis, PML. 2. Neuro consult 3. LP results pending 4. No SIRS, holding off on ABx / anti virals for the moment pending neurologist recs. 5. Check thiamine 1. Then Give empiric thiamine 2. Seizure disorder - 1. Seizure precautions 2. Tele monitor 3. EEG 4. Defer med treatment start to neurology 3. Hypokalemia, hypomagnesemia - 1. Replace  DVT prophylaxis: Lovenox Code Status: Full Family Communication: No family in room Disposition Plan: TBD Consults called: Neurology Admission status: Admit to inpatient  Severity of Illness: The appropriate patient status for this patient is INPATIENT. Inpatient status is judged to be reasonable and necessary in order to provide the required intensity of service to ensure the patient's safety. The patient's presenting symptoms, physical exam findings, and initial radiographic and laboratory data in the context of their chronic comorbidities is felt to place them at high risk for further clinical deterioration. Furthermore, it is not anticipated that the patient will be medically stable for discharge from the hospital within 2 midnights of admission. The following factors support the patient status of inpatient.   IP status for workup of abnormal brain MRI, seizures, hallucinations, and encephalopathy presumably related to abnormalities seen on MRI brain.   * I certify that at the point of admission it is my clinical judgment that the patient will require inpatient hospital care spanning beyond 2 midnights from the point of admission due to high intensity of service, high risk for further deterioration and high frequency of surveillance required.*    Cyanne Delmar M. DO [facility]  How to contact the [facility] Attending or Consulting provider [place] or covering provider during after hours [place], for this patient?  1. Check  the care team in [facility] and look  for a) attending/consulting [place] provider listed and b) the [facility] team listed 2. Log into [email or web address]  Amion Physician Scheduling and messaging for groups and whole hospitals  On call and physician scheduling software for group practices, residents, hospitalists and other medical providers for call, clinic, rotation and shift schedules. OnCall [employer] is a hospital-wide system for scheduling doctors and paging doctors on call. EasyPlot is for scientific plotting and data analysis.  [email or web address]  and use [place]'s universal password to access. If you do not have the password, please contact the hospital operator.  3. Locate the [facility] provider you are looking for under [facility] and page to a number that you can be directly reached. 4. If you still have difficulty reaching the provider, please page the [facility] (Director on Call) for the Hospitalists listed on amion for assistance.  09/02/2020, 8:48 PM

## 2020-09-02 NOTE — ED Provider Notes (Signed)
Emergency Medicine Provider Triage Evaluation Note  Cindy Hoffman , a 44 y.o. female  was evaluated in triage.  Pt complains of nothing.  She is a transfer from [facility].  Per note from [facility] .  " Assessment: Plan  patient will be transferred to [facility] for medical clearance. Patient hx is concerning that EMR notes that patient started having hallucinations and seizures after her fall Feb 7th. Patient reports a hx of epilepsy but notes she had not had one for a long time prior to her fall Feb 7th. Patient does have Neurologist, but has not been able to get her EEG or MRI Brain. Patient is also endorsing increased blurred vision since Feb. Patient behavior also appears to have changed as she has suddenly abruptly stopped her medications. Based on EMR and messages from patient's MyChart patient appears to have been very communicative with her physicians in the past and it seems somewhat out of character for the patient to make this decision.  Patient father was concerned that the hallucinations were 2/2 EtOH use but patient EtOH was negative and was also negative 4/28 when patient was found to be hypokalemic, hypomagnesia, with melena. Patient EKG also noted prolonged QTc 4/28, increasing concern that the use of antipsychotics will worsening this. Patient did endorse that she was not taking her K prescribed after discharge. At this time with no other psychiatric hx of hallucinations and current metabolic derangement and recent hx of neurological changes patient needs medical workup to rule out medical causes for change in cognitive function.  Altered Mental Status - Recommend MRI Brain - Recommend EEG - Rescind IVC patient is willing to go to [facility] for further medical workup  Hypokalemia, K 2.9 - Recommend replenish K - Recommend assessing  Mg  Hx of Gastric bypass - Recommend Vit B12   PGY-1 Freida Busman, MD 09/02/2020 10:21 AM"  Patient arrives by EMS with police presence.   Review of  Systems  Positive: hallucinations Negative: fever  Physical Exam  BP 126/83 (BP Location: Left Arm)   Pulse 95   Temp 98.6 F (37 C) (Oral)   Resp 18   SpO2 96%  Gen:   Awake, no distress   Resp:  Normal effort  MSK:   Moves extremities without difficulty  Other:  Calm, cooperative  Medical Decision Making  Medically screening exam initiated at 1:35 PM.  Appropriate orders placed.  Cindy Hoffman was informed that the remainder of the evaluation will be completed by another provider, this initial triage assessment does not replace that evaluation, and the importance of remaining in the ED until their evaluation is complete. Ordered magnesium, ekg.    Ollen Gross 09/02/20 1339    Little, Wenda Overland, MD 09/05/20 [phone]

## 2020-09-02 NOTE — ED Notes (Signed)
Patient eating

## 2020-09-02 NOTE — ED Notes (Signed)
Received verbal report from [place] at this time

## 2020-09-02 NOTE — ED Notes (Signed)
Patient ambulated to unit without difficulty.  Patient denied SI, HI, AVH.  Stated she was drunk sleeping and someone called it in.

## 2020-09-02 NOTE — ED Provider Notes (Signed)
I was requested to perform LP by Dr. Tomi Bamberger  .Lumbar Puncture  Date/Time: 09/02/2020 6:51 PM Performed by: Domenic Moras, PA-C Authorized by: Domenic Moras, PA-C   Consent:    Consent obtained:  Verbal   Consent given by:  Patient   Risks, benefits, and alternatives were discussed: yes     Risks discussed:  Infection, pain, headache, repeat procedure and bleeding   Alternatives discussed:  Alternative treatment Universal protocol:    Procedure explained and questions answered to patient or proxy's satisfaction: yes     Relevant documents present and verified: yes     Test results available: yes     Patient identity confirmed:  Verbally with patient and arm band Pre-procedure details:    Procedure purpose:  Diagnostic   Preparation: Patient was prepped and draped in usual sterile fashion   Anesthesia:    Anesthesia method:  Local infiltration   Local anesthetic:  Lidocaine 2% w/o epi Procedure details:    Lumbar space:  L3-L4 interspace   Patient position:  Sitting   Needle gauge:  22   Needle type:  Spinal needle - Quincke tip   Needle length (in):  3.5   Ultrasound guidance: no     Number of attempts:  2   Fluid appearance:  Clear   Tubes of fluid:  4   Total volume (ml):  6 Post-procedure details:    Puncture site:  Adhesive bandage applied and direct pressure applied   Procedure completion:  Tolerated well, no immediate complications      Domenic Moras, PA-C 09/02/20 Yevette Edwards    Dorie Rank, MD 09/04/20 [phone]

## 2020-09-02 NOTE — ED Provider Notes (Addendum)
Behavioral Health Progress Note  Date and Time: 09/02/2020 10:21 AM Name: Cindy Hoffman MRN:  [id number]  Subjective:   Cindy Hoffman is a 45 y/o female who presented under IVC by father 2/2 hallucinations and PMH of Anixety treated with Lexapro. Patient reports she stopped taking her Lexapro last week and also stopped taking all of her other medications abruptly wit the exception of her Dulera and Tizanidine. Patient presented to [facility] under IVC paper petitioned by her father. Patient reports that she is aware of where she is this AM and that her father brought her in. Patient denies recent hallucinations, but reports she has hallucinated in the past month. 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 Patient rolls her eyes when asked about specific hallucinations her father mentioned. Patient does not deny that she may have been talking to her dead sister, but overall is dismissive of the hallucinations dad mentioned. Patient reports that she has had both AH and VH over the past month. Patient also denies recent EtOH consumption and reports that the Vodka bottles her father mentioned are all from a boyfriend. Patient reports that she was only feeling unwell yesterday because " I was dehydrated." Patient reports she is feeling better after getting Apple Juice last night and sleeping. Patient denies SI, HI, and AVH this AM.  Returned later to reassess patient. Patient was reported that she had "heard other people talking outside the door last night, and we were joking back and forth." Patient is unable to describe the people or what they were talking about, but she says she heard them.  Patient reports that she believes that her "family told" her that they would bring clothes and food for her and she reports hearing the nurses last night talking about "wow that's a lot of food." Patient is told multiple times during exam that her family has not brought anything and patient  eventually believes this and decides to take food offered by staff.  Of note EMR reports that patient was in the [facility] 4/28 after near syncope. Patient was positive for orthostatic hypotension, hypomagnesemia and hypokalemia. Patient has not been taking her prescribed K since discharge. Diagnosis:  Final diagnoses:  Acute psychosis ([place])    Total Time spent with patient: 30 minutes  Past Psychiatric History: Anxiety treated with Lexapro Past Medical History:  Past Medical History:  Diagnosis Date  . Anxiety   . Arthritis    knees  . Asthma   . Chronic pain syndrome   . Complication of anesthesia    "STATES WAKES UP AND CANT BREATHE AND NEEDS BREATHING TREATMENT  IN PACU"  . Constipation due to opioid therapy   . Dysrhythmia    palpitations- "from thyroid"  . Fibromyalgia   . Fracture of right foot   . Gastric ulcer 2018  . GERD (gastroesophageal reflux disease)   . History of kidney stones    "inbedded"  . Migraine    Mirgraine-  . Myofacial muscle pain   . Obesity   . Occipital neuralgia 2016  . OSA (obstructive sleep apnea)    Has CPAP  . Sleep apnea     NO CPAP--could not tolerates mask  . Thyroid disease   . Viral respiratory infection 06/26/13   ? influenza    Past Surgical History:  Procedure Laterality Date  . COLONOSCOPY      x2  . ENDOMETRIAL ABLATION    . ENDOMETRIAL ABLATION  07/17/2017  .  GALLBLADDER SURGERY  06/2016  . 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 HIP ARTHROSCOPY Right 05/10/2019   Procedure: Right hip arthroscopy with labrum repair and  femoroplasty;  Surgeon: Nicholes Stairs, MD;  Location: [facility];  Service: Orthopedics;  Laterality: Right;  2.5 hrs  . NASAL SEPTUM SURGERY    . THYROID LOBECTOMY Right 07/19/2013   Procedure: THYROID LOBECTOMY;  Surgeon: Harl Bowie, MD;  Location: [facility];  Service: General;  Laterality: Right;  . TUBAL LIGATION    . WISDOM TOOTH EXTRACTION     Family History:  Family History  Problem Relation Age of  Onset  . Hypertension Mother   . Hypertension Father   . Prostate cancer Father   . Diabetes Sister   . Cancer Paternal Grandmother        breast   Family Psychiatric  History: Unknown at this time. Social History:  Social History   Substance and Sexual Activity  Alcohol Use Yes  . Alcohol/week: 2.0 standard drinks  . Types: 2 Glasses of wine per week     Social History   Substance and Sexual Activity  Drug Use No    Social History   Socioeconomic History  . Marital status: Legally Separated    Spouse name: Not on file  . Number of children: 1  . Years of education: GED  . Highest education level: Not on file  Occupational History  . Occupation: unemployed  Tobacco Use  . Smoking status: Current Every Day Smoker    Packs/day: 0.50    Years: 0.00    Pack years: 0.00    Types: Cigarettes    Last attempt to quit: 11/2015    Years since quitting: 4.8  . Smokeless tobacco: Never Used  Vaping Use  . Vaping Use: Never used  Substance and Sexual Activity  . Alcohol use: Yes    Alcohol/week: 2.0 standard drinks    Types: 2 Glasses of wine per week  . Drug use: No  . Sexual activity: Not on file  Other Topics Concern  . Not on file  Social History Narrative   Lives with father   Caffeine use: rare   Right handed   Social Determinants of Health   Financial Resource Strain: Not on file  Food Insecurity: Not on file  Transportation Needs: Not on file  Physical Activity: Not on file  Stress: Not on file  Social Connections: Not on file   SDOH:  SDOH Screenings   Alcohol Screen: Not on file  Depression [id number]): Not on file  Financial Resource Strain: Not on file  Food Insecurity: Not on file  Housing: Not on file  Physical Activity: Not on file  Social Connections: Not on file  Stress: Not on file  Tobacco Use: High Risk  . Smoking Tobacco Use: Current Every Day Smoker  . Smokeless Tobacco Use: Never Used  Transportation Needs: Not on file    Additional Social History:    Pain Medications: None Prescriptions: Lexapro tazaniane?  Has been off for 2 weeks. Over the Counter: None History of alcohol / drug use?: No history of alcohol / drug abuse                    Sleep: Fair  Appetite:  Improving  Current Medications:  Current Facility-Administered Medications  Medication Dose Route Frequency Provider Last Rate Last Admin  . escitalopram (LEXAPRO) tablet 20 mg  20 mg Oral Daily Ajibola, Ene A, NP      .  pantoprazole (PROTONIX) EC tablet 40 mg  40 mg Oral Daily Ajibola, Ene A, NP       Current Outpatient Medications  Medication Sig Dispense Refill  . tiZANidine (ZANAFLEX) 4 MG tablet TAKE 1-2 TABLETS (4-8 MG TOTAL) BY MOUTH EVERY 8 (EIGHT) HOURS AS NEEDED FOR MUSCLE SPASMS. 270 tablet 1  . albuterol (PROVENTIL HFA;VENTOLIN HFA) 108 (90 BASE) MCG/ACT inhaler Inhale 2 puffs into the lungs every 6 (six) hours as needed for wheezing or shortness of breath.     Marland Kitchen albuterol (PROVENTIL) (2.5 MG/3ML) 0.083% nebulizer solution Take 2.5 mg by nebulization every 6 (six) hours as needed for wheezing or shortness of breath.    Marland Kitchen amitriptyline (ELAVIL) 25 MG tablet TAKE 4 TABLETS (100 MG TOTAL) BY MOUTH AT BEDTIME. (Patient not taking: Reported on 09/02/2020) 360 tablet 4  . baclofen (LIORESAL) 10 MG tablet TAKE 1 TABS BY MOUTH 3 TIMES DAILY AS NEEDED FOR MUSCLE SPASMS. PLEASE SPECIFY DIRECTIONS. (Patient not taking: Reported on 09/02/2020) 30 tablet 6  . dicyclomine (BENTYL) 20 MG tablet Take 20 mg by mouth 3 (three) times daily as needed (abdominal spasms.).  (Patient not taking: Reported on 09/02/2020)    . escitalopram (LEXAPRO) 20 MG tablet Take 20 mg by mouth daily. (Patient not taking: Reported on 09/02/2020)    . esomeprazole (NEXIUM) 40 MG capsule Take 40 mg by mouth 2 (two) times daily. (Patient not taking: Reported on 09/02/2020)    . furosemide (LASIX) 20 MG tablet Take 2 tablets (40 mg total) by mouth daily. (Patient not taking:  Reported on 09/02/2020) 20 tablet 0  . gabapentin (NEURONTIN) 250 MG/5ML solution Take 900 mg by mouth 3 (three) times daily. (Patient not taking: Reported on 09/02/2020)    . hydrOXYzine (ATARAX/VISTARIL) 25 MG tablet Take 25 mg by mouth 2 (two) times daily as needed for anxiety. (Patient not taking: Reported on 09/02/2020)    . iron polysaccharides (NIFEREX) 150 MG capsule Take 150 mg by mouth 2 (two) times daily. (Patient not taking: Reported on 09/02/2020)    . LORazepam (ATIVAN) 0.5 MG tablet Take 0.5 mg by mouth daily as needed for anxiety. (Patient not taking: Reported on 09/02/2020)    . methocarbamol (ROBAXIN) 500 MG tablet Take 1 tablet (500 mg total) by mouth every 6 (six) hours as needed for muscle spasms. (Patient not taking: Reported on 09/02/2020) 60 tablet 0  . mometasone-formoterol (DULERA) 100-5 MCG/ACT AERO Inhale 2 puffs into the lungs 2 (two) times daily. (Patient not taking: Reported on 09/02/2020)    . MOVANTIK 25 MG TABS tablet Take 25 mg by mouth at bedtime. (Patient not taking: Reported on 09/02/2020)    . oxyCODONE ER 13.5 MG C12A Take 13.5 mg by mouth 2 (two) times daily. (Patient not taking: Reported on 09/02/2020)    . Oxycodone HCl 10 MG TABS Take 10 mg by mouth 3 (three) times daily. (Patient not taking: Reported on 09/02/2020)    . potassium chloride SA (KLOR-CON) 20 MEQ tablet Take 1 tablet (20 mEq total) by mouth daily for 7 days. 7 tablet 0  . topiramate (TOPAMAX) 200 MG tablet TAKE 1 TABLET (200 MG TOTAL) BY MOUTH AT BEDTIME. (Patient not taking: Reported on 09/02/2020) 90 tablet 1    Labs  Lab Results:  Admission on 09/01/2020  Component Date Value Ref Range Status  . SARS Coronavirus 2 by RT PCR 09/02/2020 NEGATIVE  NEGATIVE Final   Comment: (NOTE) SARS-CoV-2 target nucleic acids are NOT DETECTED.  The SARS-CoV-2 RNA  is generally detectable in upper respiratory specimens during the acute phase of infection. The lowest concentration of SARS-CoV-2 viral copies this assay can  detect is 138 copies/mL. A negative result does not preclude SARS-Cov-2 infection and should not be used as the sole basis for treatment or other patient management decisions. A negative result may occur with  improper specimen collection/handling, submission of specimen other than nasopharyngeal swab, presence of viral mutation(s) within the areas targeted by this assay, and inadequate number of viral copies(<138 copies/mL). A negative result must be combined with clinical observations, patient history, and epidemiological information. The expected result is Negative.  Fact Sheet for Patients:  EntrepreneurPulse.com.au  Fact Sheet for Healthcare Providers:  IncredibleEmployment.be  This test is no                          t yet approved or cleared by the [place] FDA and  has been authorized for detection and/or diagnosis of SARS-CoV-2 by FDA under an Emergency Use Authorization (EUA). This EUA will remain  in effect (meaning this test can be used) for the duration of the COVID-19 declaration under Section 564(b)(1) of the Act, 21 U.S.C.section 360bbb-3(b)(1), unless the authorization is terminated  or revoked sooner.      . Influenza A by PCR 09/02/2020 NEGATIVE  NEGATIVE Final  . Influenza B by PCR 09/02/2020 NEGATIVE  NEGATIVE Final   Comment: (NOTE) The Xpert Xpress SARS-CoV-2/FLU/RSV plus assay is intended as an aid in the diagnosis of influenza from Nasopharyngeal swab specimens and should not be used as a sole basis for treatment. Nasal washings and aspirates are unacceptable for Xpert Xpress SARS-CoV-2/FLU/RSV testing.  Fact Sheet for Patients: EntrepreneurPulse.com.au  Fact Sheet for Healthcare Providers: IncredibleEmployment.be  This test is not yet approved or cleared by the [place] FDA and has been authorized for detection and/or diagnosis of SARS-CoV-2 by FDA under an Emergency  Use Authorization (EUA). This EUA will remain in effect (meaning this test can be used) for the duration of the COVID-19 declaration under Section 564(b)(1) of the Act, 21 U.S.C. section 360bbb-3(b)(1), unless the authorization is terminated or revoked.  Performed at [facility], [place] [address], [place], [place] [address]   . WBC 09/02/2020 8.0  4.0 - 10.5 K/uL Final  . RBC 09/02/2020 4.08  3.87 - 5.11 MIL/uL Final  . Hemoglobin 09/02/2020 11.7* 12.0 - 15.0 g/dL Final  . HCT 09/02/2020 34.9* 36.0 - 46.0 % Final  . MCV 09/02/2020 85.5  80.0 - 100.0 fL Final  . MCH 09/02/2020 28.7  26.0 - 34.0 pg Final  . MCHC 09/02/2020 33.5  30.0 - 36.0 g/dL Final  . RDW 09/02/2020 19.8* 11.5 - 15.5 % Final  . Platelets 09/02/2020 227  150 - 400 K/uL Final  . nRBC 09/02/2020 0.0  0.0 - 0.2 % Final  . Neutrophils Relative % 09/02/2020 73  % Final  . Neutro Abs 09/02/2020 5.9  1.7 - 7.7 K/uL Final  . Lymphocytes Relative 09/02/2020 15  % Final  . Lymphs Abs 09/02/2020 1.2  0.7 - 4.0 K/uL Final  . Monocytes Relative 09/02/2020 9  % Final  . Monocytes Absolute 09/02/2020 0.7  0.1 - 1.0 K/uL Final  . Eosinophils Relative 09/02/2020 1  % Final  . Eosinophils Absolute 09/02/2020 0.1  0.0 - 0.5 K/uL Final  . Basophils Relative 09/02/2020 1  % Final  . Basophils Absolute 09/02/2020 0.0  0.0 - 0.1 K/uL Final  .  Immature Granulocytes 09/02/2020 1  % Final  . Abs Immature Granulocytes 09/02/2020 0.09* 0.00 - 0.07 K/uL Final   Performed at [facility] [address] [place], [place] [address]  . Sodium 09/02/2020 133* 135 - 145 mmol/L Final  . Potassium 09/02/2020 2.9* 3.5 - 5.1 mmol/L Final  . Chloride 09/02/2020 98  98 - 111 mmol/L Final  . CO2 09/02/2020 24  22 - 32 mmol/L Final  . Glucose, Bld 09/02/2020 111* 70 - 99 mg/dL Final   Glucose reference range applies only to samples taken after fasting for at least 8 hours.  . BUN 09/02/2020 14  6 - 20 mg/dL Final  . Creatinine, Ser  09/02/2020 0.56  0.44 - 1.00 mg/dL Final  . Calcium 09/02/2020 8.7* 8.9 - 10.3 mg/dL Final  . Total Protein 09/02/2020 7.0  6.5 - 8.1 g/dL Final  . Albumin 09/02/2020 3.5  3.5 - 5.0 g/dL Final  . AST 09/02/2020 25  15 - 41 U/L Final  . ALT 09/02/2020 29  0 - 44 U/L Final  . Alkaline Phosphatase 09/02/2020 113  38 - 126 U/L Final  . Total Bilirubin 09/02/2020 0.8  0.3 - 1.2 mg/dL Final  . GFR, Estimated 09/02/2020 >60  >60 mL/min Final   Comment: (NOTE) Calculated using the CKD-EPI Creatinine Equation (2021)   . Anion gap 09/02/2020 11  5 - 15 Final   Performed at [facility] [address], [place], [place] [address]  . Hgb A1c MFr Bld 09/02/2020 5.4  4.8 - 5.6 % Final   Comment: (NOTE) Pre diabetes:          5.7%-6.4%  Diabetes:              >6.4%  Glycemic control for   <7.0% adults with diabetes   . Mean Plasma Glucose 09/02/2020 108.28  mg/dL Final   Performed at [facility] [address] [place], [place] [address]  . Alcohol, Ethyl (B) 09/02/2020 <10  <10 mg/dL Final   Comment: (NOTE) Lowest detectable limit for serum alcohol is 10 mg/dL.  For medical purposes only. Performed at [facility], [place] [address] [place], [place] [address]   . TSH 09/02/2020 2.182  0.350 - 4.500 uIU/mL Final   Comment: Performed by a 3rd Generation assay with a functional sensitivity of <=0.01 uIU/mL. Performed at [facility], [place] [address], [place], [place] [address]   . POC Amphetamine UR 09/02/2020 None Detected  NONE DETECTED (Cut Off Level 1000 ng/mL) Final  . POC Secobarbital (BAR) 09/02/2020 None Detected  NONE DETECTED (Cut Off Level 300 ng/mL) Final  . POC Buprenorphine (BUP) 09/02/2020 None Detected  NONE DETECTED (Cut Off Level 10 ng/mL) Final  . POC Oxazepam (BZO) 09/02/2020 None Detected  NONE DETECTED (Cut Off Level 300 ng/mL) Final  . POC Cocaine UR 09/02/2020 None Detected  NONE DETECTED (Cut Off Level 300 ng/mL) Final  . POC Methamphetamine UR  09/02/2020 None Detected  NONE DETECTED (Cut Off Level 1000 ng/mL) Final  . POC Morphine 09/02/2020 None Detected  NONE DETECTED (Cut Off Level 300 ng/mL) Final  . POC Oxycodone UR 09/02/2020 Positive* NONE DETECTED (Cut Off Level 100 ng/mL) Final  . POC Methadone UR 09/02/2020 None Detected  NONE DETECTED (Cut Off Level 300 ng/mL) Final  . POC Marijuana UR 09/02/2020 None Detected  NONE DETECTED (Cut Off Level 50 ng/mL) Final  . SARSCOV2ONAVIRUS 2 AG 09/02/2020 NEGATIVE  NEGATIVE Final  Comment: (NOTE) SARS-CoV-2 antigen NOT DETECTED.   Negative results are presumptive.  Negative results do not preclude SARS-CoV-2 infection and should not be used as the sole basis for treatment or other patient management decisions, including infection  control decisions, particularly in the presence of clinical signs and  symptoms consistent with COVID-19, or in those who have been in contact with the virus.  Negative results must be combined with clinical observations, patient history, and epidemiological information. The expected result is Negative.  Fact Sheet for Patients: HandmadeRecipes.com.cy  Fact Sheet for Healthcare Providers: FuneralLife.at  This test is not yet approved or cleared by the [place] FDA and  has been authorized for detection and/or diagnosis of SARS-CoV-2 by FDA under an Emergency Use Authorization (EUA).  This EUA will remain in effect (meaning this test can be used) for the duration of  the COV                          ID-19 declaration under Section 564(b)(1) of the Act, 21 U.S.C. section 360bbb-3(b)(1), unless the authorization is terminated or revoked sooner.    . Preg Test, Ur 09/02/2020 NEGATIVE  NEGATIVE Final   Comment:        THE SENSITIVITY OF THIS METHODOLOGY IS >24 mIU/mL   . Cholesterol 09/02/2020 161  0 - 200 mg/dL Final  . Triglycerides 09/02/2020 114  <150 mg/dL Final  . HDL 09/02/2020 67  >40  mg/dL Final  . Total CHOL/HDL Ratio 09/02/2020 2.4  RATIO Final  . VLDL 09/02/2020 23  0 - 40 mg/dL Final  . LDL Cholesterol 09/02/2020 71  0 - 99 mg/dL Final   Comment:        Total Cholesterol/HDL:CHD Risk Coronary Heart Disease Risk Table                     Men   Women  1/2 Average Risk   3.4   3.3  Average Risk       5.0   4.4  2 X Average Risk   9.6   7.1  3 X Average Risk  23.4   11.0        Use the calculated Patient Ratio above and the CHD Risk Table to determine the patient's CHD Risk.        ATP III CLASSIFICATION (LDL):  <100     mg/dL   Optimal  100-129  mg/dL   Near or Above                    Optimal  130-159  mg/dL   Borderline  160-189  mg/dL   High  >190     mg/dL   Very High Performed at [facility] [address], [place], [place] [address]   Admission on 08/28/2020, Discharged on 08/28/2020  Component Date Value Ref Range Status  . WBC 08/28/2020 6.0  4.0 - 10.5 K/uL Final  . RBC 08/28/2020 4.45  3.87 - 5.11 MIL/uL Final  . Hemoglobin 08/28/2020 12.5  12.0 - 15.0 g/dL Final  . HCT 08/28/2020 37.7  36.0 - 46.0 % Final  . MCV 08/28/2020 84.7  80.0 - 100.0 fL Final  . MCH 08/28/2020 28.1  26.0 - 34.0 pg Final  . MCHC 08/28/2020 33.2  30.0 - 36.0 g/dL Final  . RDW 08/28/2020 17.3* 11.5 - 15.5 % Final  . Platelets 08/28/2020 180  150 - 400 K/uL Final  .  nRBC 08/28/2020 0.0  0.0 - 0.2 % Final   Performed at [facility], [address], [place], [place] [address]  . Troponin I (High Sensitivity) 08/28/2020 29* <18 ng/L Final   Comment: (NOTE) Elevated high sensitivity troponin I (hsTnI) values and significant  changes across serial measurements may suggest ACS but many other  chronic and acute conditions are known to elevate hsTnI results.  Refer to the "Links" section for chest pain algorithms and additional  guidance. Performed at [facility], [address] [place], [place] [address]   . Magnesium 08/28/2020 1.6* 1.7 - 2.4 mg/dL  Final   Performed at [facility], [address], [place], [place] [address]  . Alcohol, Ethyl (B) 08/28/2020 <10  <10 mg/dL Final   Comment: (NOTE) Lowest detectable limit for serum alcohol is 10 mg/dL.  For medical purposes only. Performed at [facility], [address], [place], [place] [address]   . TSH 08/28/2020 3.471  0.350 - 4.500 uIU/mL Final   Comment: Performed by a 3rd Generation assay with a functional sensitivity of <=0.01 uIU/mL. Performed at [facility], [address] [place], [place] [address]   . Fecal Occult Bld 08/28/2020 NEGATIVE  NEGATIVE Final  . Sodium 08/28/2020 135  135 - 145 mmol/L Final  . Potassium 08/28/2020 3.0* 3.5 - 5.1 mmol/L Final  . Chloride 08/28/2020 103  98 - 111 mmol/L Final  . CO2 08/28/2020 23  22 - 32 mmol/L Final  . Glucose, Bld 08/28/2020 116* 70 - 99 mg/dL Final   Glucose reference range applies only to samples taken after fasting for at least 8 hours.  . BUN 08/28/2020 8  6 - 20 mg/dL Final  . Creatinine, Ser 08/28/2020 0.66  0.44 - 1.00 mg/dL Final  . Calcium 13/12/6576 8.0* 8.9 - 10.3 mg/dL Final  . Total Protein 08/28/2020 6.2* 6.5 - 8.1 g/dL Final  . Albumin 46/96/2952 2.8* 3.5 - 5.0 g/dL Final  . AST 84/13/2440 72* 15 - 41 U/L Final  . ALT 08/28/2020 50* 0 - 44 U/L Final  . Alkaline Phosphatase 08/28/2020 112  38 - 126 U/L Final  . Total Bilirubin 08/28/2020 0.9  0.3 - 1.2 mg/dL Final  . GFR, Estimated 08/28/2020 >60  >60 mL/min Final   Comment: (NOTE) Calculated using the CKD-EPI Creatinine Equation (2021)   . Anion gap 08/28/2020 9  5 - 15 Final   Performed at [facility], [address] [place], [place] [address]  . Troponin I (High Sensitivity) 08/28/2020 25* <18 ng/L Final   Comment: (NOTE) Elevated high sensitivity troponin I (hsTnI) values and significant  changes across serial measurements may suggest ACS but many other  chronic and acute conditions are known to elevate hsTnI results.   Refer to the "Links" section for chest pain algorithms and additional  guidance. Performed at [facility], [address], [place], [place] [address]     Blood Alcohol level:  Lab Results  Component Value Date   [facility] <10 09/02/2020   ETH <10 08/28/2020    Metabolic Disorder Labs: Lab Results  Component Value Date   HGBA1C 5.4 09/02/2020   MPG 108.28 09/02/2020   No results found for: PROLACTIN Lab Results  Component Value Date   CHOL 161 09/02/2020   TRIG 114 09/02/2020   HDL 67 09/02/2020   CHOLHDL 2.4 09/02/2020   VLDL 23 09/02/2020   LDLCALC 71 09/02/2020  Therapeutic Lab Levels: No results found for: LITHIUM No results found for: VALPROATE No components found for:  CBMZ  Physical Findings   Flowsheet Row ED from 09/01/2020 in [facility] ED from 08/28/2020 in [facility] ED from 06/14/2020 in [facility] DEPT  C-SSRS RISK CATEGORY No Risk No Risk No Risk       Musculoskeletal  Strength & Muscle Tone: within normal limits Gait & Station: normal Patient leans: N/A  Psychiatric Specialty Exam  Presentation  General Appearance: Appropriate for Environment  Eye Contact:Fair  Speech:Clear and Coherent  Speech Volume:Decreased  Handedness:Right   Mood and Affect  Mood:Euthymic  Affect:Appropriate   Thought Process  Thought Processes:Coherent  Descriptions of Associations:Tangential  Orientation:Full (Time, Place and Person)  Thought Content:Logical; Perseveration (perseverating on clothing being brought by family, but redirectable)  Diagnosis of Schizophrenia or Schizoaffective disorder in past: No    Hallucinations:Hallucinations: Other (comment) (talks about what appear to be AVH last night, responds to internal stimuli at one point this AM) Description of Auditory Hallucinations: "hearig voices of people arguing on internet  chat" Description of Visual Hallucinations: seing step sister giggling  Ideas of Reference:Paranoia  Suicidal Thoughts:Suicidal Thoughts: No  Homicidal Thoughts:Homicidal Thoughts: No   Sensorium  Memory:Immediate Good; Remote Good  Judgment:Fair  Insight:Fair   Executive Functions  Concentration:Fair  Attention Span:Good  [place] of Knowledge:Good  Language:Good   Psychomotor Activity  Psychomotor Activity:Psychomotor Activity: Normal   Assets  Assets:Housing; Data processing manager; Communication Skills   Sleep  Sleep:Sleep: Good   Nutritional Assessment (For OBS and FBC admissions only) Has the patient had a weight loss or gain of 10 pounds or more in the last 3 months?: No Has the patient had a decrease in food intake/or appetite?: No Does the patient have dental problems?: No Does the patient have eating habits or behaviors that may be indicators of an eating disorder including binging or inducing vomiting?: No Has the patient recently lost weight without trying?: No Has the patient been eating poorly because of a decreased appetite?: No Malnutrition Screening Tool Score: 0    Physical Exam  Physical Exam Constitutional:      Appearance: Normal appearance.  HENT:     Head: Normocephalic and atraumatic.     Nose: Nose normal.  Eyes:     Extraocular Movements: Extraocular movements intact.     Conjunctiva/sclera: Conjunctivae normal.  Cardiovascular:     Rate and Rhythm: Normal rate.     Pulses: Normal pulses.  Pulmonary:     Effort: Pulmonary effort is normal.  Musculoskeletal:        General: Normal range of motion.  Skin:    General: Skin is warm and dry.  Neurological:     Mental Status: She is alert and oriented to person, place, and time.    Review of Systems  Constitutional: Negative for chills and fever.  HENT: Negative for hearing loss.   Eyes: Positive for blurred vision.  Respiratory: Negative for cough and wheezing.    Cardiovascular: Negative for chest pain.  Gastrointestinal: Negative for abdominal pain.  Psychiatric/Behavioral: Negative for suicidal ideas.   Blood pressure (!) 133/92, pulse 100, temperature 98.6 F (37 C), temperature source Oral, resp. rate 18, SpO2 98 %. There is no height or weight on file to calculate BMI.  Treatment Plan Summary:  Assessment: Plan  patient will be transferred to [facility] for medical clearance. Patient hx is concerning that EMR  notes that patient started having hallucinations and seizures after her fall Feb 7th. Patient reports a hx of epilepsy but notes she had not had one for a long time prior to her fall Feb 7th. Patient does have Neurologist, but has not been able to get her EEG or MRI Brain. Patient is also endorsing increased blurred vision since Feb. Patient behavior also appears to have changed as she has suddenly abruptly stopped her medications. Based on EMR and messages from patient's MyChart patient appears to have been very communicative with her physicians in the past and it seems somewhat out of character for the patient to make this decision.  Patient father was concerned that the hallucinations were 2/2 EtOH use but patient EtOH was negative and was also negative 4/28 when patient was found to be hypokalemic, hypomagnesia, with melena. Patient EKG also noted prolonged QTc 4/28, increasing concern that the use of antipsychotics will worsening this. Patient did endorse that she was not taking her K prescribed after discharge. At this time with no other psychiatric hx of hallucinations and current metabolic derangement and recent hx of neurological changes patient needs medical workup to rule out medical causes for change in cognitive function.  Altered Mental Status - Recommend MRI Brain - Recommend EEG - Rescind IVC patient is willing to go to [facility] for further medical workup  Hypokalemia, K 2.9 - Recommend replenish K - Recommend assessing  Mg  Hx of  Gastric bypass - Recommend Vit B12   PGY-1 Freida Busman, MD 09/02/2020 10:21 AM

## 2020-09-02 NOTE — ED Notes (Signed)
Pt seem to be little manic.

## 2020-09-02 NOTE — ED Notes (Signed)
Flat x 1 hour post LP until 1955.

## 2020-09-02 NOTE — Discharge Instructions (Addendum)
Patient is instructed to take all prescribed medications as recommended. Report any side effects or adverse reactions to your outpatient psychiatrist. Patient is instructed to abstain from alcohol and illegal drugs while on prescription medications. In the event of worsening symptoms, patient is instructed to call the crisis hotline, 911, or go to the nearest emergency department for evaluation and treatment.   

## 2020-09-02 NOTE — ED Triage Notes (Signed)
Pt BIB EMS from [facility] due to abd normal labs & needing MRI. Per facility pt started having hallucinations.Pt has h/o traumatic brain injury

## 2020-09-02 NOTE — Consult Note (Addendum)
Neurology Consultation  Reason for Consult: AMS, seizures, hallucinations, abnormal brain imaging Referring Physician: Dr Tomi Bamberger, EDP  CC: Altered mental status, hallucinations, seizures  History is obtained from: Chart, patient  HPI: Cindy Hoffman is a 45 y.o. female was brought into the hospital and involuntarily committed for having hallucinations and headaches.  Neurology was consulted because the patient had also complained of having seizures. She is a 45 year old woman who has a past medical history significant for bariatric surgery, anxiety, depression, fibromyalgia, palpitations, thyroid disease, migraine headaches for which she sees Dr. Lavell Anchors at [facility] neurology, receives Botox injections and currently is on Muscle relaxants and topiramate for headaches along with antianxiety medications which she had stopped taking a few days or week ago. She was brought into the emergency room because her father noticed that patient has been talking to herself, complaining about "God is watching", complaining about bunch of knives around the house and reported that people were in the bathroom having sex in the bathroom.  Patient denies any such episodes and does not know why she was brought in for a psychiatric evaluation. She reports that her headaches are under decent control since her last Botox injection. Due to the reports of her having seizures at home-which she is unable to clearly describe and history of headaches, along with altered mental status and hallucinations, an MRI of the brain was done which was abnormal Patient denies any current headaches. She denies any focal tingling numbness or weakness. She denies any visual symptoms at this time She denies seeing things that are not present at this time. Denies any suicidal or homicidal ideation. Some question of excessive use of alcohol-father reported 15 bottles of vodka in her room. She has not been on any immunosuppressive medications  except for steroids for her asthma for many many years.  Chart review of neurology notes reveals that 07/22/2020 she had a fall and hit her head, after which she started hallucinating.  She saw her dead sister and had hallucinations for 3 days, was admitted or seen at [facility].  She reported there is a demon trying to kill her at that time.  There was no alcohol detected in her system, she was positive for oxycodone which was prescribed.  She was also hearing people.  In February 2022, she was seen in the ED for evidence of acute psychosis on exam with normal CT head, and ongoing headaches.  Plan was to evaluate her with an MRI.  Also back in October of last year, she had been seen in the emergency room for altered mentation for 15 to 20 minutes where she was sitting on the toilet and had limbs locked up and felt that her limbs were shaking, no urinary incontinence, no tongue biting, no drugs or alcohol were noted in her system.  Neurological exam at the time was reported to be normal.  She was recommended to maintain seizure precautions and no driving for 6 months.  EEG was recommended but I do not see one completed then.  ROS: Full ROS was performed and is negative except as noted in the HPI.   Past Medical History:  Diagnosis Date  . Anxiety   . Arthritis    knees  . Asthma   . Chronic pain syndrome   . Complication of anesthesia    "STATES WAKES UP AND CANT BREATHE AND NEEDS BREATHING TREATMENT  IN PACU"  . Constipation due to opioid therapy   . Dysrhythmia    palpitations- "from thyroid"  .  Fibromyalgia   . Fracture of right foot   . Gastric ulcer 2018  . GERD (gastroesophageal reflux disease)   . History of kidney stones    "inbedded"  . Migraine    Mirgraine-  . Myofacial muscle pain   . Obesity   . Occipital neuralgia 2016  . OSA (obstructive sleep apnea)    Has CPAP  . Sleep apnea     NO CPAP--could not tolerates mask  . Thyroid disease   . Viral respiratory infection 06/26/13    ? influenza   Family History  Problem Relation Age of Onset  . Hypertension Mother   . Hypertension Father   . Prostate cancer Father   . Diabetes Sister   . Cancer Paternal Grandmother        breast   Social History:   reports that she has been smoking cigarettes. She has been smoking about 0.50 packs per day for the past 0.00 years. She has never used smokeless tobacco. She reports current alcohol use of about 2.0 standard drinks of alcohol per week. She reports that she does not use drugs.  Medications  Current Facility-Administered Medications:  .  acetaminophen (TYLENOL) tablet 650 mg, 650 mg, Oral, Q6H PRN **OR** acetaminophen (TYLENOL) suppository 650 mg, 650 mg, Rectal, Q6H PRN, Alcario Drought, Jared M, DO .  albuterol (VENTOLIN HFA) 108 (90 Base) MCG/ACT inhaler 2 puff, 2 puff, Inhalation, Q6H PRN, Alcario Drought, Jared M, DO .  enoxaparin (LOVENOX) injection 40 mg, 40 mg, Subcutaneous, Q24H, Gardner, Jared M, DO .  magnesium sulfate IVPB 2 g 50 mL, 2 g, Intravenous, Once, Gardner, Jared M, DO .  ondansetron (ZOFRAN) tablet 4 mg, 4 mg, Oral, Q6H PRN **OR** ondansetron (ZOFRAN) injection 4 mg, 4 mg, Intravenous, Q6H PRN, Etta Quill, DO  Current Outpatient Medications:  .  albuterol (PROVENTIL HFA;VENTOLIN HFA) 108 (90 BASE) MCG/ACT inhaler, Inhale 2 puffs into the lungs every 6 (six) hours as needed for wheezing or shortness of breath. , Disp: , Rfl:  .  albuterol (PROVENTIL) (2.5 MG/3ML) 0.083% nebulizer solution, Take 2.5 mg by nebulization every 6 (six) hours as needed for wheezing or shortness of breath., Disp: , Rfl:  .  amitriptyline (ELAVIL) 25 MG tablet, TAKE 4 TABLETS (100 MG TOTAL) BY MOUTH AT BEDTIME. (Patient not taking: Reported on 09/02/2020), Disp: 360 tablet, Rfl: 4 .  baclofen (LIORESAL) 10 MG tablet, TAKE 1 TABS BY MOUTH 3 TIMES DAILY AS NEEDED FOR MUSCLE SPASMS. PLEASE SPECIFY DIRECTIONS. (Patient not taking: Reported on 09/02/2020), Disp: 30 tablet, Rfl: 6 .   dicyclomine (BENTYL) 20 MG tablet, Take 20 mg by mouth 3 (three) times daily as needed (abdominal spasms.).  (Patient not taking: Reported on 09/02/2020), Disp: , Rfl:  .  escitalopram (LEXAPRO) 20 MG tablet, Take 20 mg by mouth daily. (Patient not taking: Reported on 09/02/2020), Disp: , Rfl:  .  esomeprazole (NEXIUM) 40 MG capsule, Take 40 mg by mouth 2 (two) times daily. (Patient not taking: Reported on 09/02/2020), Disp: , Rfl:  .  furosemide (LASIX) 20 MG tablet, Take 2 tablets (40 mg total) by mouth daily. (Patient not taking: Reported on 09/02/2020), Disp: 20 tablet, Rfl: 0 .  gabapentin (NEURONTIN) 250 MG/5ML solution, Take 900 mg by mouth 3 (three) times daily. (Patient not taking: Reported on 09/02/2020), Disp: , Rfl:  .  hydrOXYzine (ATARAX/VISTARIL) 25 MG tablet, Take 25 mg by mouth 2 (two) times daily as needed for anxiety. (Patient not taking: Reported on 09/02/2020), Disp: , Rfl:  .  iron polysaccharides (NIFEREX) 150 MG capsule, Take 150 mg by mouth 2 (two) times daily. (Patient not taking: Reported on 09/02/2020), Disp: , Rfl:  .  LORazepam (ATIVAN) 0.5 MG tablet, Take 0.5 mg by mouth daily as needed for anxiety. (Patient not taking: Reported on 09/02/2020), Disp: , Rfl:  .  methocarbamol (ROBAXIN) 500 MG tablet, Take 1 tablet (500 mg total) by mouth every 6 (six) hours as needed for muscle spasms. (Patient not taking: Reported on 09/02/2020), Disp: 60 tablet, Rfl: 0 .  mometasone-formoterol (DULERA) 100-5 MCG/ACT AERO, Inhale 2 puffs into the lungs 2 (two) times daily. (Patient not taking: Reported on 09/02/2020), Disp: , Rfl:  .  MOVANTIK 25 MG TABS tablet, Take 25 mg by mouth at bedtime. (Patient not taking: Reported on 09/02/2020), Disp: , Rfl:  .  oxyCODONE ER 13.5 MG C12A, Take 13.5 mg by mouth 2 (two) times daily. (Patient not taking: Reported on 09/02/2020), Disp: , Rfl:  .  Oxycodone HCl 10 MG TABS, Take 10 mg by mouth 3 (three) times daily. (Patient not taking: Reported on 09/02/2020), Disp: , Rfl:  .   potassium chloride SA (KLOR-CON) 20 MEQ tablet, Take 1 tablet (20 mEq total) by mouth daily for 7 days., Disp: 7 tablet, Rfl: 0 .  tiZANidine (ZANAFLEX) 4 MG tablet, TAKE 1-2 TABLETS (4-8 MG TOTAL) BY MOUTH EVERY 8 (EIGHT) HOURS AS NEEDED FOR MUSCLE SPASMS., Disp: 270 tablet, Rfl: 1 .  topiramate (TOPAMAX) 200 MG tablet, TAKE 1 TABLET (200 MG TOTAL) BY MOUTH AT BEDTIME. (Patient not taking: Reported on 09/02/2020), Disp: 90 tablet, Rfl: 1   Exam: Current vital signs: BP 130/79   Pulse 100   Temp 98.5 F (36.9 C) (Oral)   Resp 17   SpO2 99%  Vital signs in last 24 hours: Temp:  [98.5 F (36.9 C)-99 F (37.2 C)] 98.5 F (36.9 C) (05/03 1600) Pulse Rate:  [80-104] 100 (05/03 2002) Resp:  [17-20] 17 (05/03 2002) BP: (120-148)/(79-92) 130/79 (05/03 2002) SpO2:  [96 %-100 %] 99 % (05/03 2002) General: Awake alert in no distress HEENT: Normocephalic atraumatic dry oral mucous membranes Chest:Clear  cardiovascular: Regular rhythm Abdomen nondistended nontender Neurological exam Awake alert oriented x3 Speech is clear with no dysarthria No evidence of aphasia Cranial nerves II to XII with no evidence of ophthalmoplegia, pupils equal round reactive to light, face symmetric, facial sensation intact, tongue and palate midline. Motor examination with symmetric 5/5 strength in all 4 extremities without drift Sensation intact to light touch without extinction Coordination with no dysmetria Gait testing deferred at this time  Labs I have reviewed labs in epic and the results pertinent to this consultation are: CBC    Component Value Date/Time   WBC 8.0 09/02/2020 0122   RBC 4.08 09/02/2020 0122   HGB 11.7 (L) 09/02/2020 0122   HCT 34.9 (L) 09/02/2020 0122   PLT 227 09/02/2020 0122   MCV 85.5 09/02/2020 0122   MCH 28.7 09/02/2020 0122   MCHC 33.5 09/02/2020 0122   RDW 19.8 (H) 09/02/2020 0122   LYMPHSABS 1.2 09/02/2020 0122   MONOABS 0.7 09/02/2020 0122   EOSABS 0.1 09/02/2020 0122    BASOSABS 0.0 09/02/2020 0122    CMP     Component Value Date/Time   NA 133 (L) 09/02/2020 0122   K 2.9 (L) 09/02/2020 0122   CL 98 09/02/2020 0122   CO2 24 09/02/2020 0122   GLUCOSE 111 (H) 09/02/2020 0122   BUN 14 09/02/2020 0122   CREATININE  0.56 09/02/2020 0122   CALCIUM 8.7 (L) 09/02/2020 0122   PROT 7.0 09/02/2020 0122   ALBUMIN 3.5 09/02/2020 0122   AST 25 09/02/2020 0122   ALT 29 09/02/2020 0122   ALKPHOS 113 09/02/2020 0122   BILITOT 0.8 09/02/2020 0122   GFRNONAA >60 09/02/2020 0122   GFRAA >60 02/04/2020 1659   Imaging I have reviewed the images obtained: MRI of the brain with and without contrast shows multiple patchy ill-defined areas of white matter hyperintensity bilaterally most prominent in the left frontal lobe and left occipital parietal lobe.  This has progressed since 2016.  There is small focal areas of white matter hyperintensity bilaterally mildly progressive from the prior study. Radiology differentials include demyelination, acute on chronic encephalitis, and PML. There is no abnormal enhancement.  Assessment:  45 year old woman with past medical history of migraine headaches, anxiety, depression, fibromyalgia, status post bariatric surgery, presenting for evaluation of altered mental status and hallucinations, initially was committed involuntarily but then released for further medical evaluation prior to any psychiatric admission, noted to have abnormal MRI as listed above. There are ill-defined areas of white matter hyperintensity bilaterally which do not necessarily fit any typical pattern.  Differentials do include demyelination versus encephalitis and according to radiology also PML but it is very uncharacteristic for all of those.  Not on any immunosuppressants except for steroids for asthma control. Other differentials to consider are autoimmune encephalitis such as anti-MOG encephalitis, ADEM-which can present with headaches and psychiatric  features. Imaging not consistent with HSV encephalitis.  MRI brain is also not consistent with Wernicke's. Spinal tap was been done in the ER and results are pending at this time.  Impression:  Abnormal brain MRI with a broad differential as above  Headaches-in good control  Hallucinations  Possible acute psychosis   Recommendations:  Await CSF results  Observation overnight is reasonable till all the results are put together in a reasonable treatment plan is made.  For most of the autoimmune conditions, high-dose steroids of the treatment.  I will hold off on steroid treatment at least overnight since she actively was hallucinating and steroids can exacerbate the symptoms.  Once the preliminary CSF results are obtained, further decision can be made to send out more testing including anti-MOG in serum and IgG index and oligoclonal bands in the CSF.  The CSF obtained by the ER was only 6 cc, might need a repeat tap under IR guidance as she reported that she is very uncomfortable with the bedside tap.  Routine EEG  Although MRI is not consistent with Wernicke's, can check thiamine levels prior to repeating given history of bariatric surgery.  Neurology will continue to follow with you Plan discussed with Dr. Tomi Bamberger in the ER next  -- Amie Portland, MD Neurologist  [facility] Pager: [phone]

## 2020-09-02 NOTE — ED Notes (Signed)
EEG in the department

## 2020-09-02 NOTE — ED Notes (Signed)
Pt has received her meal.

## 2020-09-03 DIAGNOSIS — R4182 Altered mental status, unspecified: Secondary | ICD-10-CM

## 2020-09-03 LAB — BASIC METABOLIC PANEL
Anion gap: 10 (ref 5–15)
Anion gap: 4 — ABNORMAL LOW (ref 5–15)
BUN: 7 mg/dL (ref 6–20)
BUN: 5 mg/dL — ABNORMAL LOW (ref 6–20)
CO2: 23 mmol/L (ref 22–32)
CO2: 24 mmol/L (ref 22–32)
Calcium: 8.3 mg/dL — ABNORMAL LOW (ref 8.9–10.3)
Calcium: 7.9 mg/dL — ABNORMAL LOW (ref 8.9–10.3)
Chloride: 106 mmol/L (ref 98–111)
Chloride: 109 mmol/L (ref 98–111)
Creatinine, Ser: 0.72 mg/dL (ref 0.44–1.00)
Creatinine, Ser: 0.52 mg/dL (ref 0.44–1.00)
GFR, Estimated: >60 mL/min (ref >60)
GFR, Estimated: >60 mL/min (ref >60)
Glucose, Bld: 127 mg/dL — ABNORMAL HIGH (ref 70–99)
Glucose, Bld: 87 mg/dL (ref 70–99)
Potassium: 2.4 mmol/L — CL (ref 3.5–5.1)
Potassium: 3.8 mmol/L (ref 3.5–5.1)
Sodium: 139 mmol/L (ref 135–145)
Sodium: 137 mmol/L (ref 135–145)

## 2020-09-03 LAB — CBC
HCT: 34.1 % — ABNORMAL LOW (ref 36.0–46.0)
Hemoglobin: 10.9 g/dL — ABNORMAL LOW (ref 12.0–15.0)
MCH: 28.5 pg (ref 26.0–34.0)
MCHC: 32 g/dL (ref 30.0–36.0)
MCV: 89.3 fL (ref 80.0–100.0)
Platelets: 201 10*3/uL (ref 150–400)
RBC: 3.82 MIL/uL — ABNORMAL LOW (ref 3.87–5.11)
RDW: 20.1 % — ABNORMAL HIGH (ref 11.5–15.5)
WBC: 5 10*3/uL (ref 4.0–10.5)
nRBC: 0 % (ref 0.0–0.2)

## 2020-09-03 LAB — PROLACTIN: Prolactin: 4 ng/mL — ABNORMAL LOW (ref 4.8–23.3)

## 2020-09-03 LAB — HIV ANTIBODY (ROUTINE TESTING W REFLEX): HIV Screen 4th Generation wRfx: NONREACTIVE

## 2020-09-03 MED ORDER — SODIUM CHLORIDE 0.9 % IV SOLN: INTRAVENOUS | Status: DC | PRN

## 2020-09-03 MED ORDER — TIZANIDINE HCL 4 MG PO TABS
4.0000 mg | ORAL_TABLET | Freq: Four times a day (QID) | ORAL | Status: DC | PRN
  Administered 2020-09-04 – 2020-09-05 (×4): 4 mg via ORAL
  Filled 2020-09-03 (×4): qty 1

## 2020-09-03 MED ORDER — POTASSIUM CHLORIDE 10 MEQ/100ML IV SOLN
10.0000 meq | INTRAVENOUS | Status: AC
  Administered 2020-09-03 (×6): 10 meq via INTRAVENOUS
  Filled 2020-09-03 (×6): qty 100

## 2020-09-03 NOTE — ED Notes (Signed)
Charge nurse made aware of medication issue

## 2020-09-03 NOTE — Progress Notes (Signed)
CSF results     Await EEG Send Anti-MOG antibodies serum Add IgG index, O-bands. Consider high dose steroids Neurology will follow.  -- Amie Portland, MD Neurologist [facility] Pager: [phone]

## 2020-09-03 NOTE — Progress Notes (Signed)
PROGRESS NOTE  Cindy Hoffman VVO:[id number] DOB: 1975/08/05 DOA: 09/02/2020 PCP: Berkley Harvey, NP   LOS: 1 day   Brief narrative:  Cindy Hoffman is a 45 y.o. female with medical history significant of  chronic pain  syndrome, obesity, asthma presented to hospital with hallucination and seizures.  She also complained of having some falls and blurred vision.  She had stopped some of her medications and continued to have hallucinations..  Patient was then IVCD and was brought into the behavioral health unit.  Patient was thought to have  some medical issues so she was referred to the emergency department.  MRI of the brain done in the ED showed bilateral white matter abnormalities with differentials including demyelinating disease with acute on chronic encephalomyelitis,PML.  Neurology was consulted and underwent lumbar puncture.  Patient was then admitted to hospital for further evaluation and treatment.  Assessment/Plan:  Principal Problem:   MRI of brain abnormal Active Problems:   Hallucinations   Encephalopathy   Seizure disorder (HCC)  Abnormal MRI of brain, encephalopathy, hallucinations - Differential diagnosis included  demyelinating disease, acute on chronic enterocolitis, PML.  Neuro was consulted and lumbar puncture was done without significant findings.  Neurology to follow-up with further labs including oligoclonal bands, IgG CSF index..  CSF culture with no white cells or organisms, no growth in less than 24 hours.  Seizure disorder - EEG was performed.  EEG was negative for seizure-like activity.    Significant hypokalemia.  Potassium 2.4 today.  Received IV and oral potassium.  Check levels in a.m.  hypomagnesemia -was replenished.  Check levels in a.m.  DVT prophylaxis: enoxaparin (LOVENOX) injection 40 mg Start: 09/02/20 2100   Code Status: Full code  Family Communication:  None  Status is: Inpatient  Remains inpatient appropriate because:Ongoing  diagnostic testing needed not appropriate for outpatient work up, IV treatments appropriate due to intensity of illness or inability to take PO and Inpatient level of care appropriate due to severity of illness   Dispo: The patient is from: Home              Anticipated d/c is to: Home              Patient currently is not medically stable to d/c.   Difficult to place patient No   Consultants:  Neurology  Procedures:  EEG  Lumbar puncture  Anti-infectives:  . None  Anti-infectives (From admission, onward)   None     Subjective: Today, patient was seen and examined at bedside.  Patient complains of  headache, has history of migraines as outpatient.  Communicated without any confusion.  Objective: Vitals:   09/03/20 1245 09/03/20 1309  BP: 130/81 118/78  Pulse: 93 86  Resp: 18 20  Temp: 98.1 F (36.7 C) (!) 97.5 F (36.4 C)  SpO2: 100% 100%    Intake/Output Summary (Last 24 hours) at 09/03/2020 1353 Last data filed at 09/03/2020 1322 Gross per 24 hour  Intake 831.84 ml  Output --  Net 831.84 ml   Filed Weights   09/03/20 0117  Weight: 81.6 kg   Body mass index is 30.9 kg/m.   Physical Exam: GENERAL: Patient is alert awake and oriented. Not in obvious distress. HENT: No scleral pallor or icterus. Pupils equally reactive to light. Oral mucosa is moist NECK: is supple, no gross swelling noted. CHEST: Clear to auscultation. No crackles or wheezes.  Diminished breath sounds bilaterally. CVS: S1 and S2 heard, no murmur. Regular rate  and rhythm.  ABDOMEN: Soft, non-tender, bowel sounds are present. EXTREMITIES: No edema. CNS: Cranial nerves are intact. No focal motor deficits. SKIN: warm and dry without rashes.  Data Review: I have personally reviewed the following laboratory data and studies,  CBC: Recent Labs  Lab 08/28/20 1918 09/02/20 0122 09/03/20 0233  WBC 6.0 8.0 5.0  NEUTROABS  --  5.9  --   HGB 12.5 11.7* 10.9*  HCT 37.7 34.9* 34.1*  MCV 84.7  85.5 89.3  PLT 180 227 673   Basic Metabolic Panel: Recent Labs  Lab 08/28/20 1918 09/02/20 0122 09/02/20 1351 09/03/20 0233  NA 135 133*  --  139  K 3.0* 2.9*  --  2.4*  CL 103 98  --  106  CO2 23 24  --  23  GLUCOSE 116* 111*  --  127*  BUN 8 14  --  7  CREATININE 0.66 0.56  --  0.72  CALCIUM 8.0* 8.7*  --  8.3*  MG 1.6*  --  2.0  --    Liver Function Tests: Recent Labs  Lab 08/28/20 1918 09/02/20 0122  AST 72* 25  ALT 50* 29  ALKPHOS 112 113  BILITOT 0.9 0.8  PROT 6.2* 7.0  ALBUMIN 2.8* 3.5   No results for input(s): LIPASE, AMYLASE in the last 168 hours. No results for input(s): AMMONIA in the last 168 hours. Cardiac Enzymes: No results for input(s): CKTOTAL, CKMB, CKMBINDEX, TROPONINI in the last 168 hours. BNP (last 3 results) No results for input(s): BNP in the last 8760 hours.  ProBNP (last 3 results) No results for input(s): PROBNP in the last 8760 hours.  CBG: No results for input(s): GLUCAP in the last 168 hours. Recent Results (from the past 240 hour(s))  Resp Panel by RT-PCR (Flu A&B, Covid) Nasopharyngeal Swab     Status: None   Collection Time: 09/02/20  1:06 AM   Specimen: Nasopharyngeal Swab; Nasopharyngeal(NP) swabs in vial transport medium  Result Value Ref Range Status   SARS Coronavirus 2 by RT PCR NEGATIVE NEGATIVE Final    Comment: (NOTE) SARS-CoV-2 target nucleic acids are NOT DETECTED.  The SARS-CoV-2 RNA is generally detectable in upper respiratory specimens during the acute phase of infection. The lowest concentration of SARS-CoV-2 viral copies this assay can detect is 138 copies/mL. A negative result does not preclude SARS-Cov-2 infection and should not be used as the sole basis for treatment or other patient management decisions. A negative result may occur with  improper specimen collection/handling, submission of specimen other than nasopharyngeal swab, presence of viral mutation(s) within the areas targeted by this assay,  and inadequate number of viral copies(<138 copies/mL). A negative result must be combined with clinical observations, patient history, and epidemiological information. The expected result is Negative.  Fact Sheet for Patients:  EntrepreneurPulse.com.au  Fact Sheet for Healthcare Providers:  IncredibleEmployment.be  This test is no t yet approved or cleared by the [place] FDA and  has been authorized for detection and/or diagnosis of SARS-CoV-2 by FDA under an Emergency Use Authorization (EUA). This EUA will remain  in effect (meaning this test can be used) for the duration of the COVID-19 declaration under Section 564(b)(1) of the Act, 21 U.S.C.section 360bbb-3(b)(1), unless the authorization is terminated  or revoked sooner.       Influenza A by PCR NEGATIVE NEGATIVE Final   Influenza B by PCR NEGATIVE NEGATIVE Final    Comment: (NOTE) The Xpert Xpress SARS-CoV-2/FLU/RSV plus assay is intended as an  aid in the diagnosis of influenza from Nasopharyngeal swab specimens and should not be used as a sole basis for treatment. Nasal washings and aspirates are unacceptable for Xpert Xpress SARS-CoV-2/FLU/RSV testing.  Fact Sheet for Patients: EntrepreneurPulse.com.au  Fact Sheet for Healthcare Providers: IncredibleEmployment.be  This test is not yet approved or cleared by the [place] FDA and has been authorized for detection and/or diagnosis of SARS-CoV-2 by FDA under an Emergency Use Authorization (EUA). This EUA will remain in effect (meaning this test can be used) for the duration of the COVID-19 declaration under Section 564(b)(1) of the Act, 21 U.S.C. section 360bbb-3(b)(1), unless the authorization is terminated or revoked.  Performed at [facility], [place] [address], [place], [place] [address]   CSF culture     Status: None (Preliminary result)   Collection Time: 09/02/20  6:55 PM    Specimen: CSF; Cerebrospinal Fluid  Result Value Ref Range Status   Specimen Description CSF  Final   Special Requests NONE  Final   Gram Stain NO WBC SEEN NO ORGANISMS SEEN CYTOSPIN SMEAR   Final   Culture   Final    NO GROWTH < 24 HOURS Performed at [facility], [place] [address], [place], [place] [address]    Report Status PENDING  Incomplete     Studies: MR Brain W and Wo Contrast  Result Date: 09/02/2020 CLINICAL DATA:  Acute mental status change EXAM: MRI HEAD WITHOUT AND WITH CONTRAST TECHNIQUE: Multiplanar, multiecho pulse sequences of the brain and surrounding structures were obtained without and with intravenous contrast. CONTRAST:  30mL GADAVIST GADOBUTROL 1 MMOL/ML IV SOLN COMPARISON:  CT head 02/05/2020.  MRI head 03/31/2015 FINDINGS: Brain: Multiple patchy ill-defined areas of white matter hyperintensity bilaterally most prominent in the left frontal lobe and left occipital parietal lobe. This has progressed since 2016. Small focal areas of white matter hyperintensity bilaterally are mild and progressive from the prior study. Ventricle size normal. Cerebral volume normal. No areas of restricted diffusion. Negative for hemorrhage or mass. Normal brainstem. Normal enhancement. Vascular: Normal arterial flow voids. Skull and upper cervical spine: No focal skeletal abnormality. Sinuses/Orbits: Normal orbit.  Paranasal sinuses clear. Other: None IMPRESSION: Ill-defined areas of white matter hyperintensity bilaterally left greater than right, with progression since 2016. Differential diagnosis includes demyelinating disease, acute or chronic encephalomyelitis, PML. Negative for acute infarct.  No enhancing lesion identified. Electronically Signed   By: Franchot Gallo M.D.   On: 09/02/2020 17:11   EEG adult  Result Date: 09/03/2020 Lora Havens, MD     09/03/2020  8:44 AM Patient Name: MARGRETTA ZAMORANO MRN: [id number] Epilepsy Attending: Lora Havens Referring Physician/Provider:  Dr Kerney Elbe Date: 09/02/2020 Duration: 22.08 mins Patient history: 45 year old woman presenting for evaluation of altered mental status and hallucinations.  EEG to evaluate for seizures. Level of alertness: Awake AEDs during EEG study: Topiramate, gabapentin Technical aspects: This EEG study was done with scalp electrodes positioned according to the 10-20 International system of electrode placement. Electrical activity was acquired at a sampling rate of 500Hz  and reviewed with a high frequency filter of 70Hz  and a low frequency filter of 1Hz . EEG data were recorded continuously and digitally stored. Description: The posterior dominant rhythm consists of 9 Hz activity of moderate voltage (25-35 uV) seen predominantly in posterior head regions, symmetric and reactive to eye opening and eye closing. Hyperventilation and photic stimulation were not performed.   IMPRESSION: This study is within normal limits. No seizures or  epileptiform discharges were seen throughout the recording. Priyanka Hubert Azure, MD  [facility] 09/03/2020  If 7PM-7AM, please contact night-coverage

## 2020-09-03 NOTE — ED Notes (Signed)
Pt ambulated to restroomj

## 2020-09-03 NOTE — ED Notes (Signed)
Provider made aware of issue of medication

## 2020-09-03 NOTE — ED Notes (Signed)
Pt resting in bed, states medications have helped back pain but her headache has not improved. Pt given prn hydralazine

## 2020-09-03 NOTE — ED Notes (Signed)
Pt made aware of medication issue

## 2020-09-03 NOTE — ED Notes (Signed)
Realized medication error occurred. Charge nurse aware, pharmacy aware, and provider aware.

## 2020-09-03 NOTE — Procedures (Signed)
Patient Name: Cindy Hoffman  MRN: [id number]  Epilepsy Attending: Lora Havens  Referring Physician/Provider: Dr Kerney Elbe Date: 09/02/2020 Duration: 22.08 mins  Patient history: 45 year old woman presenting for evaluation of altered mental status and hallucinations.  EEG to evaluate for seizures.  Level of alertness: Awake  AEDs during EEG study: Topiramate, gabapentin  Technical aspects: This EEG study was done with scalp electrodes positioned according to the 10-20 International system of electrode placement. Electrical activity was acquired at a sampling rate of 500Hz  and reviewed with a high frequency filter of 70Hz  and a low frequency filter of 1Hz . EEG data were recorded continuously and digitally stored.   Description: The posterior dominant rhythm consists of 9 Hz activity of moderate voltage (25-35 uV) seen predominantly in posterior head regions, symmetric and reactive to eye opening and eye closing.  Hyperventilation and photic stimulation were not performed.     IMPRESSION: This study is within normal limits. No seizures or epileptiform discharges were seen throughout the recording.  Azaya Goedde Barbra Sarks

## 2020-09-03 NOTE — Progress Notes (Signed)
Subjective: Patient is lying on stretcher, awake alert and oriented. She states she had a rough night. She tells me she had sinus like pressure around her eyes and nose last pm and she developed a small migraine. She endorses having bilateral leg weakness for over a year and has been falling a lot. She also tells me she follows with Dr. Jaynee Eagles for migraines.   Objective: Current vital signs: BP 110/68   Pulse 69   Temp 98.6 F (37 C) (Oral)   Resp 17   Ht 5\' 4"  (1.626 m)   Wt 81.6 kg   SpO2 99%   BMI 30.90 kg/m  Vital signs in last 24 hours: Temp:  [98 F (36.7 C)-98.6 F (37 C)] 98.6 F (37 C) (05/04 0117) Pulse Rate:  [69-105] 69 (05/04 0600) Resp:  [10-20] 17 (05/04 0600) BP: (98-131)/(53-95) 110/68 (05/04 0600) SpO2:  [96 %-100 %] 99 % (05/04 0600) Weight:  [81.6 kg] 81.6 kg (05/04 0117)  Intake/Output from previous day: 05/03 0701 - 05/04 0700 In: 101.2 [IV Piggyback:101.2] Out: -  Intake/Output this shift: No intake/output data recorded. Nutritional status:  Diet Order            Diet Heart Room service appropriate? Yes; Fluid consistency: Thin  Diet effective now                 Neurologic Exam:  Physical Exam  Constitutional: Appears well-developed and well-nourished.  Psych: Affect appropriate to situation Eyes: No scleral injection HENT: No OP obstrucion MSK: no joint deformities.  Cardiovascular: Normal rate and regular rhythm.  Respiratory: Effort normal, non-labored breathing GI: Soft.  No distension. There is no tenderness.  Skin: WDI, multiple bruises all over body, different stages  Neuro: Mental Status: Patient is awake, alert, oriented to person, place, month, year, and situation Patient is able to give a clear and coherent history. No signs of aphasia or neglect Cranial Nerves: II: Visual Fields are full. Pupils are equal, round, and reactive to light.   III,IV, VI: EOMI without ptosis or diplopia.  V: Facial sensation is symmetric to  temperature VII: Facial movement is symmetric.  VIII: hearing is intact to voice X: Palate elevates symmetrically XI: Shoulder shrug is symmetric. XII: tongue is midline without atrophy or fasciculations.  Motor: Tone is normal. Bulk is normal. 5/5 strength was present in all four extremities. Sensory: Sensation is symmetric to light touch and temperature in the arms and legs. Deep Tendon Reflexes: 2+ and symmetric in the biceps and patellae. Plantars: Toes are downgoing bilaterally.  Cerebellar: FNF and HKS are intact bilaterally    Lab Results: Results for orders placed or performed during the hospital encounter of 09/02/20 (from the past 48 hour(s))  Magnesium     Status: None   Collection Time: 09/02/20  1:51 PM  Result Value Ref Range   Magnesium 2.0 1.7 - 2.4 mg/dL    Comment: Performed at [facility], [place] [address], [place], [place] [address]  CSF cell count with differential collection tube #: 1     Status: Abnormal   Collection Time: 09/02/20  5:43 PM  Result Value Ref Range   Tube # 1    Color, CSF COLORLESS COLORLESS   Appearance, CSF CLEAR CLEAR   Supernatant NOT INDICATED    RBC Count, CSF 240 (H) 0 /cu mm   WBC, CSF 2 0 - 5 /cu mm   Other Cells, CSF TOO FEW TO COUNT, SMEAR AVAILABLE FOR REVIEW  Comment: FEW NEUTROPHILS,FEW LYMPHOCTES AND RARE MONOCYTE NOTED Performed at [facility], [place] [address], [place], [place] [address]   Protein and glucose, CSF     Status: Abnormal   Collection Time: 09/02/20  5:43 PM  Result Value Ref Range   Glucose, CSF 76 (H) 40 - 70 mg/dL   Total  Protein, CSF 24 15 - 45 mg/dL    Comment: Performed at [facility] [address] [place], [place] [address]  CSF cell count with differential collection tube #: 4     Status: None   Collection Time: 09/02/20  5:44 PM  Result Value Ref Range   Tube # 4    Color, CSF COLORLESS COLORLESS   Appearance, CSF CLEAR CLEAR   Supernatant NOT INDICATED    RBC  Count, CSF 0 0 /cu mm   WBC, CSF 1 0 - 5 /cu mm   Other Cells, CSF RARE LYMPOCYTE AND RARE MONOCYTE NOTED     Comment: TOO FEW TO COUNT, SMEAR AVAILABLE FOR REVIEW Performed at [facility] [address], [place], [place] [address]   CSF culture     Status: None (Preliminary result)   Collection Time: 09/02/20  6:55 PM   Specimen: CSF; Cerebrospinal Fluid  Result Value Ref Range   Specimen Description CSF    Special Requests NONE    Gram Stain      NO WBC SEEN NO ORGANISMS SEEN CYTOSPIN SMEAR Performed at [facility], [place] [address] [place], [place] [address]    Culture PENDING    Report Status PENDING   CBC     Status: Abnormal   Collection Time: 09/03/20  2:33 AM  Result Value Ref Range   WBC 5.0 4.0 - 10.5 K/uL   RBC 3.82 (L) 3.87 - 5.11 MIL/uL   Hemoglobin 10.9 (L) 12.0 - 15.0 g/dL   HCT 34.1 (L) 36.0 - 46.0 %   MCV 89.3 80.0 - 100.0 fL   MCH 28.5 26.0 - 34.0 pg   MCHC 32.0 30.0 - 36.0 g/dL   RDW 20.1 (H) 11.5 - 15.5 %   Platelets 201 150 - 400 K/uL   nRBC 0.0 0.0 - 0.2 %    Comment: Performed at [facility], [place] [address], [place], [place] [address]  Basic metabolic panel     Status: Abnormal   Collection Time: 09/03/20  2:33 AM  Result Value Ref Range   Sodium 139 135 - 145 mmol/L   Potassium 2.4 (LL) 3.5 - 5.1 mmol/L    Comment: CRITICAL RESULT CALLED TO, READ BACK BY AND VERIFIED WITH:  Rosana Fret RN @0439  09/03/20 K. SANDERS    Chloride 106 98 - 111 mmol/L   CO2 23 22 - 32 mmol/L   Glucose, Bld 127 (H) 70 - 99 mg/dL    Comment: Glucose reference range applies only to samples taken after fasting for at least 8 hours.   BUN 7 6 - 20 mg/dL   Creatinine, Ser 0.72 0.44 - 1.00 mg/dL   Calcium 8.3 (L) 8.9 - 10.3 mg/dL   GFR, Estimated >60 >60 mL/min    Comment: (NOTE) Calculated using the CKD-EPI Creatinine Equation (2021)    Anion gap 10 5 - 15    Comment: Performed at [facility] [address] [place], [place] [address]     Recent Results (from the past 240 hour(s))  Resp Panel by RT-PCR (Flu A&B, Covid) Nasopharyngeal  Swab     Status: None   Collection Time: 09/02/20  1:06 AM   Specimen: Nasopharyngeal Swab; Nasopharyngeal(NP) swabs in vial transport medium  Result Value Ref Range Status   SARS Coronavirus 2 by RT PCR NEGATIVE NEGATIVE Final    Comment: (NOTE) SARS-CoV-2 target nucleic acids are NOT DETECTED.  The SARS-CoV-2 RNA is generally detectable in upper respiratory specimens during the acute phase of infection. The lowest concentration of SARS-CoV-2 viral copies this assay can detect is 138 copies/mL. A negative result does not preclude SARS-Cov-2 infection and should not be used as the sole basis for treatment or other patient management decisions. A negative result may occur with  improper specimen collection/handling, submission of specimen other than nasopharyngeal swab, presence of viral mutation(s) within the areas targeted by this assay, and inadequate number of viral copies(<138 copies/mL). A negative result must be combined with clinical observations, patient history, and epidemiological information. The expected result is Negative.  Fact Sheet for Patients:  EntrepreneurPulse.com.au  Fact Sheet for Healthcare Providers:  IncredibleEmployment.be  This test is no t yet approved or cleared by the [place] FDA and  has been authorized for detection and/or diagnosis of SARS-CoV-2 by FDA under an Emergency Use Authorization (EUA). This EUA will remain  in effect (meaning this test can be used) for the duration of the COVID-19 declaration under Section 564(b)(1) of the Act, 21 U.S.C.section 360bbb-3(b)(1), unless the authorization is terminated  or revoked sooner.       Influenza A by PCR NEGATIVE NEGATIVE Final   Influenza B by PCR NEGATIVE NEGATIVE Final    Comment: (NOTE) The Xpert Xpress SARS-CoV-2/FLU/RSV plus assay is intended as an  aid in the diagnosis of influenza from Nasopharyngeal swab specimens and should not be used as a sole basis for treatment. Nasal washings and aspirates are unacceptable for Xpert Xpress SARS-CoV-2/FLU/RSV testing.  Fact Sheet for Patients: EntrepreneurPulse.com.au  Fact Sheet for Healthcare Providers: IncredibleEmployment.be  This test is not yet approved or cleared by the [place] FDA and has been authorized for detection and/or diagnosis of SARS-CoV-2 by FDA under an Emergency Use Authorization (EUA). This EUA will remain in effect (meaning this test can be used) for the duration of the COVID-19 declaration under Section 564(b)(1) of the Act, 21 U.S.C. section 360bbb-3(b)(1), unless the authorization is terminated or revoked.  Performed at [facility], [place] [address], [place], [place] [address]   CSF culture     Status: None (Preliminary result)   Collection Time: 09/02/20  6:55 PM   Specimen: CSF; Cerebrospinal Fluid  Result Value Ref Range Status   Specimen Description CSF  Final   Special Requests NONE  Final   Gram Stain   Final    NO WBC SEEN NO ORGANISMS SEEN CYTOSPIN SMEAR Performed at [facility], [place] [address], [place], [place] [address]    Culture PENDING  Incomplete   Report Status PENDING  Incomplete    Lipid Panel Recent Labs    09/02/20 0122  CHOL 161  TRIG 114  HDL 67  CHOLHDL 2.4  VLDL 23  LDLCALC 71    Studies/Results: MR Brain W and Wo Contrast  Result Date: 09/02/2020 CLINICAL DATA:  Acute mental status change EXAM: MRI HEAD WITHOUT AND WITH CONTRAST TECHNIQUE: Multiplanar, multiecho pulse sequences of the brain and surrounding structures were obtained without and with intravenous contrast. CONTRAST:  81mL GADAVIST GADOBUTROL 1 MMOL/ML IV SOLN COMPARISON:  CT head 02/05/2020.  MRI head  03/31/2015 FINDINGS: Brain: Multiple patchy ill-defined areas of white matter hyperintensity bilaterally  most prominent in the left frontal lobe and left occipital parietal lobe. This has progressed since 2016. Small focal areas of white matter hyperintensity bilaterally are mild and progressive from the prior study. Ventricle size normal. Cerebral volume normal. No areas of restricted diffusion. Negative for hemorrhage or mass. Normal brainstem. Normal enhancement. Vascular: Normal arterial flow voids. Skull and upper cervical spine: No focal skeletal abnormality. Sinuses/Orbits: Normal orbit.  Paranasal sinuses clear. Other: None IMPRESSION: Ill-defined areas of white matter hyperintensity bilaterally left greater than right, with progression since 2016. Differential diagnosis includes demyelinating disease, acute or chronic encephalomyelitis, PML. Negative for acute infarct.  No enhancing lesion identified. Electronically Signed   By: Franchot Gallo M.D.   On: 09/02/2020 17:11    Medications:  Scheduled: . amitriptyline  100 mg Oral QHS  . enoxaparin (LOVENOX) injection  40 mg Subcutaneous Q24H  . escitalopram  20 mg Oral Daily  . gabapentin  900 mg Oral TID  . mometasone-formoterol  2 puff Inhalation BID  . naloxegol oxalate  25 mg Oral QHS  . oxyCODONE  10 mg Oral BID  . topiramate  200 mg Oral QHS   Continuous: . sodium chloride 100 mL/hr at 09/03/20 0504  . potassium chloride 10 mEq (09/03/20 7989)     Assessment: 45 year old female with past medical history of migraine headaches, anxiety, depression, fibromyalgia, status post bariatric surgery, presenting for evaluation of altered mental status and hallucinations, initially was committed involuntarily but then released for further medical evaluation prior to any psychiatric admission, noted to have abnormal MRI brain. - Routine EEG with no seizures identified - MRI Brain W/WO showed ill-defined areas of white matter hyperintensity bilaterally more prominent in left frontal, occipital and parietal lobes which do not necessarily fit any  typical pattern.  Differentials do include demyelination versus encephalitis and according to radiology also PML but it is very uncharacteristic for all of those.  Not on any immunosuppressants except for steroids for asthma control. - Other differentials to consider are autoimmune encephalitides such as anti-MOG encephalitis and ADEM - which can present with headaches and psychiatric features. - Imaging not consistent with HSV encephalitis.  MRI brain is also not consistent with Wernicke's. - Thiamine level is pending - Serum anti-MOG antibodies as well as CSF/serum IgG index and CSF oligoclonal bands are pending  - LP results below     Impression:  Abnormal brain MRI with a broad differential as above  Headaches-in good control  Hallucinations, which have improved since yesterday   Possible acute psychosis   Recommendations:  For most of the autoimmune conditions, high-dose steroids would be the best treatment.  Steroid treatment was held overnight since she actively was hallucinating and steroids can exacerbate the symptoms. Not hallucinating currently.   Currently her hallucinations are not occurring and steroids can be administered  Although MRI is not consistent with Wernicke's, can check thiamine levels prior to repeating given history of bariatric surgery.    LOS: 1 day   @Electronically  signed: Dr. Kerney Elbe 09/03/2020  7:17 AM

## 2020-09-04 LAB — CBC
HCT: 32.2 % — ABNORMAL LOW (ref 36.0–46.0)
Hemoglobin: 9.8 g/dL — ABNORMAL LOW (ref 12.0–15.0)
MCH: 27.8 pg (ref 26.0–34.0)
MCHC: 30.4 g/dL (ref 30.0–36.0)
MCV: 91.5 fL (ref 80.0–100.0)
Platelets: 221 10*3/uL (ref 150–400)
RBC: 3.52 MIL/uL — ABNORMAL LOW (ref 3.87–5.11)
RDW: 20.8 % — ABNORMAL HIGH (ref 11.5–15.5)
WBC: 4 10*3/uL (ref 4.0–10.5)
nRBC: 0 % (ref 0.0–0.2)

## 2020-09-04 LAB — IGG CSF INDEX
Albumin CSF-mCnc: 12 mg/dL (ref 8–37)
Albumin: 3.3 g/dL — ABNORMAL LOW (ref 3.8–4.8)
CSF IgG Index: 0.8 — ABNORMAL HIGH (ref 0.0–0.7)
IgG (Immunoglobin G), Serum: 756 mg/dL (ref 586–1602)
IgG, CSF: 2.3 mg/dL (ref 0.0–6.7)
IgG/Alb Ratio, CSF: 0.19 (ref 0.00–0.25)

## 2020-09-04 LAB — BASIC METABOLIC PANEL
Anion gap: 6 (ref 5–15)
BUN: 8 mg/dL (ref 6–20)
CO2: 25 mmol/L (ref 22–32)
Calcium: 8.2 mg/dL — ABNORMAL LOW (ref 8.9–10.3)
Chloride: 111 mmol/L (ref 98–111)
Creatinine, Ser: 0.65 mg/dL (ref 0.44–1.00)
GFR, Estimated: >60 mL/min (ref >60)
Glucose, Bld: 85 mg/dL (ref 70–99)
Potassium: 3.1 mmol/L — ABNORMAL LOW (ref 3.5–5.1)
Sodium: 142 mmol/L (ref 135–145)

## 2020-09-04 LAB — MAGNESIUM: Magnesium: 2.1 mg/dL (ref 1.7–2.4)

## 2020-09-04 LAB — GLUCOSE, CAPILLARY: Glucose-Capillary: 129 mg/dL — ABNORMAL HIGH (ref 70–99)

## 2020-09-04 MED ORDER — SODIUM CHLORIDE 0.9 % IV SOLN
1000.0000 mg | Freq: Every day | INTRAVENOUS | Status: AC
  Administered 2020-09-04 – 2020-09-08 (×5): 1000 mg via INTRAVENOUS
  Filled 2020-09-04 (×5): qty 8

## 2020-09-04 MED ORDER — THIAMINE HCL 100 MG/ML IJ SOLN: 500.0000 mg | Freq: Three times a day (TID) | INTRAVENOUS | Status: DC

## 2020-09-04 MED ORDER — MAGNESIUM OXIDE -MG SUPPLEMENT 400 (240 MG) MG PO TABS
400.0000 mg | ORAL_TABLET | Freq: Two times a day (BID) | ORAL | Status: DC
  Administered 2020-09-04 – 2020-09-08 (×9): 400 mg via ORAL
  Filled 2020-09-04 (×9): qty 1

## 2020-09-04 MED ORDER — SODIUM CHLORIDE 0.9 % IV SOLN
12.5000 mg | Freq: Once | INTRAVENOUS | Status: AC
  Administered 2020-09-04: 12.5 mg via INTRAVENOUS
  Filled 2020-09-04: qty 0.5

## 2020-09-04 MED ORDER — POTASSIUM CHLORIDE CRYS ER 20 MEQ PO TBCR
40.0000 meq | EXTENDED_RELEASE_TABLET | Freq: Two times a day (BID) | ORAL | Status: DC
  Administered 2020-09-04 (×2): 40 meq via ORAL
  Filled 2020-09-04 (×2): qty 2

## 2020-09-04 MED ORDER — THIAMINE HCL 100 MG/ML IJ SOLN
500.0000 mg | Freq: Three times a day (TID) | INTRAVENOUS | Status: AC
  Administered 2020-09-04 – 2020-09-06 (×9): 500 mg via INTRAVENOUS
  Filled 2020-09-04 (×10): qty 5

## 2020-09-04 MED ORDER — THIAMINE HCL 100 MG/ML IJ SOLN
250.0000 mg | Freq: Three times a day (TID) | INTRAVENOUS | Status: DC
  Administered 2020-09-07 – 2020-09-08 (×5): 250 mg via INTRAVENOUS
  Filled 2020-09-04 (×8): qty 2.5

## 2020-09-04 NOTE — Progress Notes (Signed)
PROGRESS NOTE  Cindy Hoffman PJA:[id number] DOB: 12-23-1975 DOA: 09/02/2020 PCP: Berkley Harvey, NP   LOS: 2 days   Brief narrative:  Cindy Hoffman is a 45 y.o. female with medical history significant of  chronic pain  syndrome, obesity, asthma presented to hospital with hallucination and seizures.  She also complained of having some falls and blurred vision.  She had stopped some of her medications and continued to have hallucinations..  Patient was then IVCD and was brought into the behavioral health unit.  Patient was thought to have  some medical issues, so she was referred to the emergency department.  MRI of the brain done in the ED showed bilateral white matter abnormalities with differentials including demyelinating disease with acute on chronic encephalomyelitis,PML.  Neurology was consulted and underwent lumbar puncture.  Patient was then admitted to hospital for further evaluation and treatment.  Assessment/Plan:  Principal Problem:   MRI of brain abnormal Active Problems:   Hallucinations   Encephalopathy   Seizure disorder (HCC)  Abnormal MRI of brain, encephalopathy, hallucinations - Differential diagnosis included  demyelinating disease, acute on chronic enterocolitis, PML.  Hallucinations have improved.  Spoke with neurology today.  Lumbar puncture reviewed..  Neurology to follow-up with further labs including oligoclonal bands, IgG CSF index..  CSF culture with no white cells or organisms, no growth so far.  Neurology has plans to start the patient on IV Solu-Medrol and thiamine today.  We will continue to monitor blood glucose levels high-dose steroids.  Seizure disorder - EEG was performed.  EEG was negative for seizure-like activity.    Significant hypokalemia.  Improved with replacement.  Potassium today at 3.1.  We will continue p.o. potassium chloride twice daily for now.  Anxiety, fibromyalgia, migraine headache, depression.  Patient is on multiple medications.   We will continue with that from home.  Continue amitriptyline, Lexapro, gabapentin, Atarax, Ativan and tizanidine including Topamax.  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  Possibility of thiamine deficiency.  On IV high-dose thiamine as per neurology.  Follow thiamine levels.  hypomagnesemia -was replenished.  Latest magnesium of 2.1.  DVT prophylaxis: enoxaparin (LOVENOX) injection 40 mg Start: 09/02/20 2100   Code Status: Full code  Family Communication: Spoke with the patient's family member at bedside.  Status is: Inpatient  Remains inpatient appropriate because:Ongoing diagnostic testing needed not appropriate for outpatient work up, IV treatments appropriate due to intensity of illness or inability to take PO and Inpatient level of care appropriate due to severity of illness   Dispo: The patient is from: Home              Anticipated d/c is to: Home              Patient currently is not medically stable to d/c.   Difficult to place patient No   Consultants:  Neurology  Procedures:  EEG  Lumbar puncture  Anti-infectives:  . None  Anti-infectives (From admission, onward)   None     Subjective: Today, patient was seen and examined at bedside.  Complains of headache but feels little better.  No seizures confusion or hallucinations   Objective: Vitals:   09/04/20 0410 09/04/20 0828  BP: 121/80 131/90  Pulse: 86 94  Resp: 18 16  Temp: 97.6 F (36.4 C) 98 F (36.7 C)  SpO2: 99% 100%    Intake/Output Summary (Last 24 hours) at 09/04/2020 1113 Last data filed at 09/03/2020 1322 Gross per 24 hour  Intake 730.68  ml  Output --  Net 730.68 ml   Filed Weights   09/03/20 0117  Weight: 81.6 kg   Body mass index is 30.9 kg/m.   Physical Exam: GENERAL: Patient is alert awake and oriented. Not in obvious distress.  Obese HENT: No scleral pallor or icterus. Pupils equally reactive to light. Oral mucosa is moist NECK: is supple, no gross swelling  noted. CHEST: Clear to auscultation. No crackles or wheezes.  Diminished breath sounds bilaterally. CVS: S1 and S2 heard, no murmur. Regular rate and rhythm.  ABDOMEN: Soft, non-tender, bowel sounds are present. EXTREMITIES: No edema. CNS: Cranial nerves are intact. No focal motor deficits. SKIN: warm and dry without rashes.  Data Review: I have personally reviewed the following laboratory data and studies,  CBC: Recent Labs  Lab 08/28/20 1918 09/02/20 0122 09/03/20 0233 09/04/20 0512  WBC 6.0 8.0 5.0 4.0  NEUTROABS  --  5.9  --   --   HGB 12.5 11.7* 10.9* 9.8*  HCT 37.7 34.9* 34.1* 32.2*  MCV 84.7 85.5 89.3 91.5  PLT 180 227 201 440   Basic Metabolic Panel: Recent Labs  Lab 08/28/20 1918 09/02/20 0122 09/02/20 1351 09/03/20 0233 09/03/20 1621 09/04/20 0512  NA 135 133*  --  139 137 142  K 3.0* 2.9*  --  2.4* 3.8 3.1*  CL 103 98  --  106 109 111  CO2 23 24  --  23 24 25   GLUCOSE 116* 111*  --  127* 87 85  BUN 8 14  --  7 5* 8  CREATININE 0.66 0.56  --  0.72 0.52 0.65  CALCIUM 8.0* 8.7*  --  8.3* 7.9* 8.2*  MG 1.6*  --  2.0  --   --  2.1   Liver Function Tests: Recent Labs  Lab 08/28/20 1918 09/02/20 0122  AST 72* 25  ALT 50* 29  ALKPHOS 112 113  BILITOT 0.9 0.8  PROT 6.2* 7.0  ALBUMIN 2.8* 3.5   No results for input(s): LIPASE, AMYLASE in the last 168 hours. No results for input(s): AMMONIA in the last 168 hours. Cardiac Enzymes: No results for input(s): CKTOTAL, CKMB, CKMBINDEX, TROPONINI in the last 168 hours. BNP (last 3 results) No results for input(s): BNP in the last 8760 hours.  ProBNP (last 3 results) No results for input(s): PROBNP in the last 8760 hours.  CBG: Recent Labs  Lab 09/04/20 0825  GLUCAP 129*   Recent Results (from the past 240 hour(s))  Resp Panel by RT-PCR (Flu A&B, Covid) Nasopharyngeal Swab     Status: None   Collection Time: 09/02/20  1:06 AM   Specimen: Nasopharyngeal Swab; Nasopharyngeal(NP) swabs in vial transport  medium  Result Value Ref Range Status   SARS Coronavirus 2 by RT PCR NEGATIVE NEGATIVE Final    Comment: (NOTE) SARS-CoV-2 target nucleic acids are NOT DETECTED.  The SARS-CoV-2 RNA is generally detectable in upper respiratory specimens during the acute phase of infection. The lowest concentration of SARS-CoV-2 viral copies this assay can detect is 138 copies/mL. A negative result does not preclude SARS-Cov-2 infection and should not be used as the sole basis for treatment or other patient management decisions. A negative result may occur with  improper specimen collection/handling, submission of specimen other than nasopharyngeal swab, presence of viral mutation(s) within the areas targeted by this assay, and inadequate number of viral copies(<138 copies/mL). A negative result must be combined with clinical observations, patient history, and epidemiological information. The expected result is Negative.  Fact Sheet for Patients:  EntrepreneurPulse.com.au  Fact Sheet for Healthcare Providers:  IncredibleEmployment.be  This test is no t yet approved or cleared by the [place] FDA and  has been authorized for detection and/or diagnosis of SARS-CoV-2 by FDA under an Emergency Use Authorization (EUA). This EUA will remain  in effect (meaning this test can be used) for the duration of the COVID-19 declaration under Section 564(b)(1) of the Act, 21 U.S.C.section 360bbb-3(b)(1), unless the authorization is terminated  or revoked sooner.       Influenza A by PCR NEGATIVE NEGATIVE Final   Influenza B by PCR NEGATIVE NEGATIVE Final    Comment: (NOTE) The Xpert Xpress SARS-CoV-2/FLU/RSV plus assay is intended as an aid in the diagnosis of influenza from Nasopharyngeal swab specimens and should not be used as a sole basis for treatment. Nasal washings and aspirates are unacceptable for Xpert Xpress SARS-CoV-2/FLU/RSV testing.  Fact Sheet for  Patients: EntrepreneurPulse.com.au  Fact Sheet for Healthcare Providers: IncredibleEmployment.be  This test is not yet approved or cleared by the [place] FDA and has been authorized for detection and/or diagnosis of SARS-CoV-2 by FDA under an Emergency Use Authorization (EUA). This EUA will remain in effect (meaning this test can be used) for the duration of the COVID-19 declaration under Section 564(b)(1) of the Act, 21 U.S.C. section 360bbb-3(b)(1), unless the authorization is terminated or revoked.  Performed at [facility], [place] [address], [place], [place] [address]   CSF culture     Status: None (Preliminary result)   Collection Time: 09/02/20  6:55 PM   Specimen: CSF; Cerebrospinal Fluid  Result Value Ref Range Status   Specimen Description CSF  Final   Special Requests NONE  Final   Gram Stain NO WBC SEEN NO ORGANISMS SEEN CYTOSPIN SMEAR   Final   Culture   Final    NO GROWTH 2 DAYS Performed at [facility], [place] [address] [place], [place] [address]    Report Status PENDING  Incomplete     Studies: MR Brain W and Wo Contrast  Result Date: 09/02/2020 CLINICAL DATA:  Acute mental status change EXAM: MRI HEAD WITHOUT AND WITH CONTRAST TECHNIQUE: Multiplanar, multiecho pulse sequences of the brain and surrounding structures were obtained without and with intravenous contrast. CONTRAST:  92mL GADAVIST GADOBUTROL 1 MMOL/ML IV SOLN COMPARISON:  CT head 02/05/2020.  MRI head 03/31/2015 FINDINGS: Brain: Multiple patchy ill-defined areas of white matter hyperintensity bilaterally most prominent in the left frontal lobe and left occipital parietal lobe. This has progressed since 2016. Small focal areas of white matter hyperintensity bilaterally are mild and progressive from the prior study. Ventricle size normal. Cerebral volume normal. No areas of restricted diffusion. Negative for hemorrhage or mass. Normal brainstem. Normal  enhancement. Vascular: Normal arterial flow voids. Skull and upper cervical spine: No focal skeletal abnormality. Sinuses/Orbits: Normal orbit.  Paranasal sinuses clear. Other: None IMPRESSION: Ill-defined areas of white matter hyperintensity bilaterally left greater than right, with progression since 2016. Differential diagnosis includes demyelinating disease, acute or chronic encephalomyelitis, PML. Negative for acute infarct.  No enhancing lesion identified. Electronically Signed   By: Franchot Gallo M.D.   On: 09/02/2020 17:11   EEG adult  Result Date: 09/03/2020 Lora Havens, MD     09/03/2020  8:44 AM Patient Name: Cindy Hoffman MRN: [id number] Epilepsy Attending: Lora Havens Referring Physician/Provider: Dr Kerney Elbe Date: 09/02/2020 Duration: 22.08 mins Patient history: 45 year old woman presenting for evaluation of altered mental  status and hallucinations.  EEG to evaluate for seizures. Level of alertness: Awake AEDs during EEG study: Topiramate, gabapentin Technical aspects: This EEG study was done with scalp electrodes positioned according to the 10-20 International system of electrode placement. Electrical activity was acquired at a sampling rate of 500Hz  and reviewed with a high frequency filter of 70Hz  and a low frequency filter of 1Hz . EEG data were recorded continuously and digitally stored. Description: The posterior dominant rhythm consists of 9 Hz activity of moderate voltage (25-35 uV) seen predominantly in posterior head regions, symmetric and reactive to eye opening and eye closing. Hyperventilation and photic stimulation were not performed.   IMPRESSION: This study is within normal limits. No seizures or epileptiform discharges were seen throughout the recording. Priyanka Hubert Azure, MD  [facility] 09/04/2020  If 7PM-7AM, please contact night-coverage

## 2020-09-04 NOTE — Progress Notes (Signed)
Subjective: States that she has a severe bi-occipital headache radiating to her bifrontal region that is throbbing and migrainous. She has a history of migraine headaches. Expresses worry regarding the hallucinations that she has been having. Significant other expresses concerns about the brain lesions which were seen on MRI.   Objective: Current vital signs: BP 131/90 (BP Location: Right Arm)   Pulse 94   Temp 98 F (36.7 C) (Oral)   Resp 16   Ht 5\' 4"  (1.626 m)   Wt 81.6 kg   SpO2 100%   BMI 30.90 kg/m  Vital signs in last 24 hours: Temp:  [97.5 F (36.4 C)-98.2 F (36.8 C)] 98 F (36.7 C) (05/05 0828) Pulse Rate:  [75-97] 94 (05/05 0828) Resp:  [16-20] 16 (05/05 0828) BP: (106-131)/(63-90) 131/90 (05/05 0828) SpO2:  [99 %-100 %] 100 % (05/05 0828)  Intake/Output from previous day: 05/04 0701 - 05/05 0700 In: 730.7 [I.V.:730.7] Out: -  Intake/Output this shift: No intake/output data recorded. Nutritional status:  Diet Order            Diet Heart Room service appropriate? Yes; Fluid consistency: Thin  Diet effective now                HEENT: [place]/AT Lungs: Respirations unlabored  Neuro: Mental Status: Patient is awake, alert, fully oriented. Speech is fluent with intact comprehension. Pleasant and cooperative.  Cranial Nerves: II: Temporal visual fields are full with no extinction to DSS.  III,IV, VI: EOMI without ptosis or diplopia.  VII: Facial movement is symmetric.  VIII: hearing is intact to voice X: No pharyngeal dysarthria XI: Shoulder shrug is symmetric. XII: No lingual dysarthria.   Motor: Tone is normal. Bulk is normal. 5/5 strength was present in all four extremities. Sensory: Sensation is intact to light touch  Cerebellar: FNF intact bilaterally Gait: Stands with own power. Negative Romberg. Ambulates with normal stride length and turns.   Lab Results: Results for orders placed or performed during the hospital encounter of 09/02/20 (from the  past 48 hour(s))  Magnesium     Status: None   Collection Time: 09/02/20  1:51 PM  Result Value Ref Range   Magnesium 2.0 1.7 - 2.4 mg/dL    Comment: Performed at [facility], [place] [address] [place], [place] [address]  CSF cell count with differential collection tube #: 1     Status: Abnormal   Collection Time: 09/02/20  5:43 PM  Result Value Ref Range   Tube # 1    Color, CSF COLORLESS COLORLESS   Appearance, CSF CLEAR CLEAR   Supernatant NOT INDICATED    RBC Count, CSF 240 (H) 0 /cu mm   WBC, CSF 2 0 - 5 /cu mm   Other Cells, CSF TOO FEW TO COUNT, SMEAR AVAILABLE FOR REVIEW     Comment: FEW NEUTROPHILS,FEW LYMPHOCTES AND RARE MONOCYTE NOTED Performed at [facility], [place] [address], [place], [place] [address]   Protein and glucose, CSF     Status: Abnormal   Collection Time: 09/02/20  5:43 PM  Result Value Ref Range   Glucose, CSF 76 (H) 40 - 70 mg/dL   Total  Protein, CSF 24 15 - 45 mg/dL    Comment: Performed at [facility] [address], [place],  [address]  CSF cell count with differential collection tube #: 4     Status: None   Collection Time: 09/02/20  5:44 PM  Result Value Ref Range  Tube # 4    Color, CSF COLORLESS COLORLESS   Appearance, CSF CLEAR CLEAR   Supernatant NOT INDICATED    RBC Count, CSF 0 0 /cu mm   WBC, CSF 1 0 - 5 /cu mm   Other Cells, CSF RARE LYMPOCYTE AND RARE MONOCYTE NOTED     Comment: TOO FEW TO COUNT, SMEAR AVAILABLE FOR REVIEW Performed at [facility] [address] [place], [place] [address]   CSF culture     Status: None (Preliminary result)   Collection Time: 09/02/20  6:55 PM   Specimen: CSF; Cerebrospinal Fluid  Result Value Ref Range   Specimen Description CSF    Special Requests NONE    Gram Stain NO WBC SEEN NO ORGANISMS SEEN CYTOSPIN SMEAR     Culture      NO GROWTH 2 DAYS Performed at [facility], [place] [address], [place], [place] [address]    Report Status PENDING   HIV  Antibody (routine testing w rflx)     Status: None   Collection Time: 09/03/20  2:33 AM  Result Value Ref Range   HIV Screen 4th Generation wRfx Non Reactive Non Reactive    Comment: Performed at [facility], [place] [address] [place], [place] [address]  CBC     Status: Abnormal   Collection Time: 09/03/20  2:33 AM  Result Value Ref Range   WBC 5.0 4.0 - 10.5 K/uL   RBC 3.82 (L) 3.87 - 5.11 MIL/uL   Hemoglobin 10.9 (L) 12.0 - 15.0 g/dL   HCT 34.1 (L) 36.0 - 46.0 %   MCV 89.3 80.0 - 100.0 fL   MCH 28.5 26.0 - 34.0 pg   MCHC 32.0 30.0 - 36.0 g/dL   RDW 20.1 (H) 11.5 - 15.5 %   Platelets 201 150 - 400 K/uL   nRBC 0.0 0.0 - 0.2 %    Comment: Performed at [facility], [place] [address], [place], [place] [address]  Basic metabolic panel     Status: Abnormal   Collection Time: 09/03/20  2:33 AM  Result Value Ref Range   Sodium 139 135 - 145 mmol/L   Potassium 2.4 (LL) 3.5 - 5.1 mmol/L    Comment: CRITICAL RESULT CALLED TO, READ BACK BY AND VERIFIED WITH:  Rosana Fret RN @0439  09/03/20 K. SANDERS    Chloride 106 98 - 111 mmol/L   CO2 23 22 - 32 mmol/L   Glucose, Bld 127 (H) 70 - 99 mg/dL    Comment: Glucose reference range applies only to samples taken after fasting for at least 8 hours.   BUN 7 6 - 20 mg/dL   Creatinine, Ser 0.72 0.44 - 1.00 mg/dL   Calcium 8.3 (L) 8.9 - 10.3 mg/dL   GFR, Estimated >60 >60 mL/min    Comment: (NOTE) Calculated using the CKD-EPI Creatinine Equation (2021)    Anion gap 10 5 - 15    Comment: Performed at [facility] [address], [place], [place] [address]  Basic metabolic panel     Status: Abnormal   Collection Time: 09/03/20  4:21 PM  Result Value Ref Range   Sodium 137 135 - 145 mmol/L   Potassium 3.8 3.5 - 5.1 mmol/L    Comment: SLIGHT HEMOLYSIS   Chloride 109 98 - 111 mmol/L   CO2 24 22 - 32 mmol/L   Glucose, Bld 87 70 - 99 mg/dL    Comment: Glucose reference range  applies only to samples taken after fasting for at least 8  hours.   BUN 5 (L) 6 - 20 mg/dL   Creatinine, Ser 0.52 0.44 - 1.00 mg/dL   Calcium 7.9 (L) 8.9 - 10.3 mg/dL   GFR, Estimated >60 >60 mL/min    Comment: (NOTE) Calculated using the CKD-EPI Creatinine Equation (2021)    Anion gap 4 (L) 5 - 15    Comment: Performed at [facility] [address] [place], [place] [address]  CBC     Status: Abnormal   Collection Time: 09/04/20  5:12 AM  Result Value Ref Range   WBC 4.0 4.0 - 10.5 K/uL   RBC 3.52 (L) 3.87 - 5.11 MIL/uL   Hemoglobin 9.8 (L) 12.0 - 15.0 g/dL   HCT 32.2 (L) 36.0 - 46.0 %   MCV 91.5 80.0 - 100.0 fL   MCH 27.8 26.0 - 34.0 pg   MCHC 30.4 30.0 - 36.0 g/dL   RDW 20.8 (H) 11.5 - 15.5 %   Platelets 221 150 - 400 K/uL   nRBC 0.0 0.0 - 0.2 %    Comment: Performed at [facility], [place] [address] [place], [place] [address]  Magnesium     Status: None   Collection Time: 09/04/20  5:12 AM  Result Value Ref Range   Magnesium 2.1 1.7 - 2.4 mg/dL    Comment: Performed at [facility] [address], [place], [place] [address]  Basic metabolic panel     Status: Abnormal   Collection Time: 09/04/20  5:12 AM  Result Value Ref Range   Sodium 142 135 - 145 mmol/L   Potassium 3.1 (L) 3.5 - 5.1 mmol/L   Chloride 111 98 - 111 mmol/L   CO2 25 22 - 32 mmol/L   Glucose, Bld 85 70 - 99 mg/dL    Comment: Glucose reference range applies only to samples taken after fasting for at least 8 hours.   BUN 8 6 - 20 mg/dL   Creatinine, Ser 0.65 0.44 - 1.00 mg/dL   Calcium 8.2 (L) 8.9 - 10.3 mg/dL   GFR, Estimated >60 >60 mL/min    Comment: (NOTE) Calculated using the CKD-EPI Creatinine Equation (2021)    Anion gap 6 5 - 15    Comment: Performed at [facility] [address] [place], [place] [address]  Glucose, capillary     Status: Abnormal   Collection Time: 09/04/20  8:25 AM  Result Value Ref Range   Glucose-Capillary 129 (H) 70 - 99 mg/dL    Comment: Glucose reference range applies only to samples taken after  fasting for at least 8 hours.    Recent Results (from the past 240 hour(s))  Resp Panel by RT-PCR (Flu A&B, Covid) Nasopharyngeal Swab     Status: None   Collection Time: 09/02/20  1:06 AM   Specimen: Nasopharyngeal Swab; Nasopharyngeal(NP) swabs in vial transport medium  Result Value Ref Range Status   SARS Coronavirus 2 by RT PCR NEGATIVE NEGATIVE Final    Comment: (NOTE) SARS-CoV-2 target nucleic acids are NOT DETECTED.  The SARS-CoV-2 RNA is generally detectable in upper respiratory specimens during the acute phase of infection. The lowest concentration of SARS-CoV-2 viral copies this assay can detect is 138 copies/mL. A negative result does not preclude SARS-Cov-2 infection and should not be used as the sole basis for treatment or other patient management decisions. A negative result may occur with  improper specimen collection/handling, submission  of specimen other than nasopharyngeal swab, presence of viral mutation(s) within the areas targeted by this assay, and inadequate number of viral copies(<138 copies/mL). A negative result must be combined with clinical observations, patient history, and epidemiological information. The expected result is Negative.  Fact Sheet for Patients:  EntrepreneurPulse.com.au  Fact Sheet for Healthcare Providers:  IncredibleEmployment.be  This test is no t yet approved or cleared by the [place] FDA and  has been authorized for detection and/or diagnosis of SARS-CoV-2 by FDA under an Emergency Use Authorization (EUA). This EUA will remain  in effect (meaning this test can be used) for the duration of the COVID-19 declaration under Section 564(b)(1) of the Act, 21 U.S.C.section 360bbb-3(b)(1), unless the authorization is terminated  or revoked sooner.       Influenza A by PCR NEGATIVE NEGATIVE Final   Influenza B by PCR NEGATIVE NEGATIVE Final    Comment: (NOTE) The Xpert Xpress  SARS-CoV-2/FLU/RSV plus assay is intended as an aid in the diagnosis of influenza from Nasopharyngeal swab specimens and should not be used as a sole basis for treatment. Nasal washings and aspirates are unacceptable for Xpert Xpress SARS-CoV-2/FLU/RSV testing.  Fact Sheet for Patients: EntrepreneurPulse.com.au  Fact Sheet for Healthcare Providers: IncredibleEmployment.be  This test is not yet approved or cleared by the [place] FDA and has been authorized for detection and/or diagnosis of SARS-CoV-2 by FDA under an Emergency Use Authorization (EUA). This EUA will remain in effect (meaning this test can be used) for the duration of the COVID-19 declaration under Section 564(b)(1) of the Act, 21 U.S.C. section 360bbb-3(b)(1), unless the authorization is terminated or revoked.  Performed at [facility], [place] [address], [place], [place] [address]   CSF culture     Status: None (Preliminary result)   Collection Time: 09/02/20  6:55 PM   Specimen: CSF; Cerebrospinal Fluid  Result Value Ref Range Status   Specimen Description CSF  Final   Special Requests NONE  Final   Gram Stain NO WBC SEEN NO ORGANISMS SEEN CYTOSPIN SMEAR   Final   Culture   Final    NO GROWTH 2 DAYS Performed at [facility], [place] [address], [place], [place] [address]    Report Status PENDING  Incomplete    Lipid Panel Recent Labs    09/02/20 0122  CHOL 161  TRIG 114  HDL 67  CHOLHDL 2.4  VLDL 23  LDLCALC 71    Studies/Results: MR Brain W and Wo Contrast  Result Date: 09/02/2020 CLINICAL DATA:  Acute mental status change EXAM: MRI HEAD WITHOUT AND WITH CONTRAST TECHNIQUE: Multiplanar, multiecho pulse sequences of the brain and surrounding structures were obtained without and with intravenous contrast. CONTRAST:  39mL GADAVIST GADOBUTROL 1 MMOL/ML IV SOLN COMPARISON:  CT head 02/05/2020.  MRI head 03/31/2015 FINDINGS: Brain: Multiple patchy  ill-defined areas of white matter hyperintensity bilaterally most prominent in the left frontal lobe and left occipital parietal lobe. This has progressed since 2016. Small focal areas of white matter hyperintensity bilaterally are mild and progressive from the prior study. Ventricle size normal. Cerebral volume normal. No areas of restricted diffusion. Negative for hemorrhage or mass. Normal brainstem. Normal enhancement. Vascular: Normal arterial flow voids. Skull and upper cervical spine: No focal skeletal abnormality. Sinuses/Orbits: Normal orbit.  Paranasal sinuses clear. Other: None IMPRESSION: Ill-defined areas of white matter hyperintensity bilaterally left greater than right, with progression since 2016. Differential diagnosis includes demyelinating disease, acute or chronic encephalomyelitis, PML. Negative for  acute infarct.  No enhancing lesion identified. Electronically Signed   By: Franchot Gallo M.D.   On: 09/02/2020 17:11   EEG adult  Result Date: 09/03/2020 Lora Havens, MD     09/03/2020  8:44 AM Patient Name: Cindy Hoffman MRN: [id number] Epilepsy Attending: Lora Havens Referring Physician/Provider: Dr Kerney Elbe Date: 09/02/2020 Duration: 22.08 mins Patient history: 44 year old woman presenting for evaluation of altered mental status and hallucinations.  EEG to evaluate for seizures. Level of alertness: Awake AEDs during EEG study: Topiramate, gabapentin Technical aspects: This EEG study was done with scalp electrodes positioned according to the 10-20 International system of electrode placement. Electrical activity was acquired at a sampling rate of 500Hz  and reviewed with a high frequency filter of 70Hz  and a low frequency filter of 1Hz . EEG data were recorded continuously and digitally stored. Description: The posterior dominant rhythm consists of 9 Hz activity of moderate voltage (25-35 uV) seen predominantly in posterior head regions, symmetric and reactive to eye opening and eye  closing. Hyperventilation and photic stimulation were not performed.   IMPRESSION: This study is within normal limits. No seizures or epileptiform discharges were seen throughout the recording. Priyanka Barbra Sarks    Medications:  Scheduled: . amitriptyline  100 mg Oral QHS  . enoxaparin (LOVENOX) injection  40 mg Subcutaneous Q24H  . escitalopram  20 mg Oral Daily  . gabapentin  900 mg Oral TID  . mometasone-formoterol  2 puff Inhalation BID  . naloxegol oxalate  25 mg Oral QHS  . oxyCODONE  10 mg Oral BID  . potassium chloride  40 mEq Oral BID  . topiramate  200 mg Oral QHS   Continuous: . sodium chloride 100 mL/hr at 09/03/20 1330  . methylPREDNISolone (SOLU-MEDROL) injection    . thiamine injection    . [START ON 09/07/2020] thiamine injection     EEG (5/4): This study is within normal limits. No seizures or epileptiform discharges were seen throughout the recording.  Assessment:45 year old female with past medical history of migraine headaches, anxiety, depression, fibromyalgia, status post bariatric surgery, presenting for evaluation of altered mental status and hallucinations, initially was committed involuntarily but then released for further medical evaluation prior to any psychiatric admission, noted to have abnormal MRI brain. - Routine EEG is WNL with no seizures identified - MRI Brain W/WO showed ill-defined areas of white matter hyperintensity bilaterally more prominent in left frontal, occipital and parietal lobes which do not necessarily fit any typical pattern. Differentials do include demyelination versus encephalitis and according to radiology also PML but it is very uncharacteristic for all of those. Not on any immunosuppressants except for steroids for asthma control. - Other differentials to consider are autoimmune encephalitides such as anti-MOG encephalitis and ADEM - which can present with headaches and psychiatric features. - Imaging not consistent with HSV  encephalitis.MRI brain is also not consistent with Wernicke's. - Thiamine level is pending - Serum anti-MOG antibodies as well as CSF/serum IgG index and CSF oligoclonal bands are pending  - LP results below     Impression:  Abnormal brain MRI with a broad differential as above  Headaches - Having increased headache pain today. Will order IV Phenergan  Hallucinations, improved   Possible acute psychosis   Recommendations:  For most of the autoimmune conditions, high-dose steroids would be the best treatment.  Steroid treatment is being started today. Order placed for 1000 mg IV Solumedrol qd x 5 days.   IV high-dose thiamine x 6 days has  been ordered. After IV thiamine is completed, should be prescribed oral thiamine 100 mg po qd.   Currently her hallucinations are not occurring and steroids can be administered  Although MRI is not consistent with Wernicke's, can check thiamine levels prior to repeating given history of bariatric surgery.    LOS: 2 days   @Electronically  signed: Dr. Kerney Elbe 09/04/2020  10:00 AM

## 2020-09-05 LAB — BASIC METABOLIC PANEL
Anion gap: 7 (ref 5–15)
BUN: 7 mg/dL (ref 6–20)
CO2: 22 mmol/L (ref 22–32)
Calcium: 8.9 mg/dL (ref 8.9–10.3)
Chloride: 111 mmol/L (ref 98–111)
Creatinine, Ser: 0.71 mg/dL (ref 0.44–1.00)
GFR, Estimated: >60 mL/min (ref >60)
Glucose, Bld: 211 mg/dL — ABNORMAL HIGH (ref 70–99)
Potassium: 5 mmol/L (ref 3.5–5.1)
Sodium: 140 mmol/L (ref 135–145)

## 2020-09-05 LAB — CBC
HCT: 36.6 % (ref 36.0–46.0)
Hemoglobin: 11.4 g/dL — ABNORMAL LOW (ref 12.0–15.0)
MCH: 28.9 pg (ref 26.0–34.0)
MCHC: 31.1 g/dL (ref 30.0–36.0)
MCV: 92.7 fL (ref 80.0–100.0)
Platelets: 301 10*3/uL (ref 150–400)
RBC: 3.95 MIL/uL (ref 3.87–5.11)
RDW: 20.5 % — ABNORMAL HIGH (ref 11.5–15.5)
WBC: 5.7 10*3/uL (ref 4.0–10.5)
nRBC: 0 % (ref 0.0–0.2)

## 2020-09-05 LAB — OLIGOCLONAL BANDS, CSF + SERM

## 2020-09-05 LAB — MAGNESIUM: Magnesium: 2.1 mg/dL (ref 1.7–2.4)

## 2020-09-05 LAB — GLUCOSE, CAPILLARY
Glucose-Capillary: 114 mg/dL — ABNORMAL HIGH (ref 70–99)
Glucose-Capillary: 169 mg/dL — ABNORMAL HIGH (ref 70–99)

## 2020-09-05 MED ORDER — SODIUM CHLORIDE 0.9 % IV SOLN
12.5000 mg | Freq: Four times a day (QID) | INTRAVENOUS | Status: DC | PRN
  Administered 2020-09-05 – 2020-09-08 (×4): 12.5 mg via INTRAVENOUS
  Filled 2020-09-05 (×5): qty 0.5

## 2020-09-05 MED ORDER — PROMETHAZINE HCL 12.5 MG PO TABS
12.5000 mg | ORAL_TABLET | Freq: Four times a day (QID) | ORAL | Status: DC | PRN
  Filled 2020-09-05: qty 1

## 2020-09-05 NOTE — Progress Notes (Signed)
Pt demonstrating drug seeking behavior. She's asking for opioids and muscle relaxer as often as possible, even making a schedule of how soon she can get each pill. Pt also smacking her forearm as a message to her boyfriend (sign of needing a tourniquet & heroin fix). BF even said "oh stop that, don't do that here, this is serious". MDs paged.

## 2020-09-05 NOTE — Care Management Important Message (Signed)
Important Message  Patient Details  Name: Cindy Hoffman MRN: [id number] Date of Birth: 04-06-1976   Medicare Important Message Given:  Yes     Orbie Pyo 09/05/2020, 2:44 PM

## 2020-09-05 NOTE — Progress Notes (Signed)
PROGRESS NOTE  Cindy Hoffman Cindy Hoffman:[id number] DOB: 01/03/1976 DOA: 09/02/2020 PCP: Berkley Harvey, NP   LOS: 3 days   Brief narrative:  Cindy Hoffman is a 45 y.o. female with medical history significant of  chronic pain  syndrome, obesity, asthma presented to hospital with hallucination and seizures.  She also complained of having some falls and blurred vision.  She had stopped some of her medications and continued to have hallucinations..  Patient was then IVCD and was brought into the behavioral health unit.  Patient was thought to have  some medical issues, so she was referred to the emergency department.  MRI of the brain done in the ED showed bilateral white matter abnormalities with differentials including demyelinating disease with acute on chronic encephalomyelitis,PML.  Neurology was consulted and underwent lumbar puncture.  Patient was then admitted to hospital for further evaluation and treatment.  Assessment/Plan:  Principal Problem:   MRI of brain abnormal Active Problems:   Hallucinations   Encephalopathy   Seizure disorder (HCC)  Abnormal MRI of brain, encephalopathy, hallucinations - Hallucinations have improved.  Differential diagnosis included  demyelinating disease, acute on chronic enterocolitis, PML.   Neurology on board..  Lumbar puncture reviewed..  Neurology to follow-up with further labs including oligoclonal bands, IgG CSF index..  CSF culture with no white cells or organisms, no growth so far.  Patient has been started on IV Solu-Medrol and thiamine.  We will continue to monitor blood glucose levels high-dose steroids.  Seizure disorder - EEG was performed.  EEG was negative for seizure-like activity.  No further seizure-like activity during hospitalization.  Significant hypokalemia.  Improved with replacement.  Potassium of 5.0 today.  Anxiety, fibromyalgia, migraine headache, depression.  Patient is on multiple medications.  We will continue with that from  home.  On amitriptyline, Lexapro, gabapentin, Atarax, Ativan and tizanidine including Topamax.  Still complains of headache.  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  Possibility of thiamine deficiency.  On IV high-dose thiamine as per neurology.  Follow thiamine levels.  hypomagnesemia -was replenished.  Latest magnesium of 2.1.  DVT prophylaxis: enoxaparin (LOVENOX) injection 40 mg Start: 09/02/20 2100   Code Status: Full code  Family Communication: Spoke with patient's significant other at bedside  Status is: Inpatient  Remains inpatient appropriate because:Ongoing diagnostic testing needed not appropriate for outpatient work up, IV treatments appropriate due to intensity of illness or inability to take PO and Inpatient level of care appropriate due to severity of illness   Dispo: The patient is from: Home              Anticipated d/c is to: Home, follow neurology recommendations              Patient currently is not medically stable to d/c.   Difficult to place patient No   Consultants:  Neurology  Procedures:  EEG  Lumbar puncture  Anti-infectives:  . None  Anti-infectives (From admission, onward)   None     Subjective: Today, patient was seen and examined at bedside.  Complains of headache.  No nausea vomiting fever or chills.  Could not sleep much at the nighttime.  Objective: Vitals:   09/05/20 0441 09/05/20 0745  BP: 113/62 132/85  Pulse: 72 75  Resp: 16 15  Temp: 97.8 F (36.6 C) 98 F (36.7 C)  SpO2: 100% 100%    Intake/Output Summary (Last 24 hours) at 09/05/2020 1041 Last data filed at 09/04/2020 1300 Gross per 24 hour  Intake  360 ml  Output --  Net 360 ml   Filed Weights   09/03/20 0117  Weight: 81.6 kg   Body mass index is 30.9 kg/m.   Physical Exam: General: Obese built, not in obvious distress HENT:   No scleral pallor or icterus noted. Oral mucosa is moist.  Chest:  Clear breath sounds.  Diminished breath sounds bilaterally.  No crackles or wheezes.  CVS: S1 &S2 heard. No murmur.  Regular rate and rhythm. Abdomen: Soft, nontender, nondistended.  Bowel sounds are heard.   Extremities: No cyanosis, clubbing or edema.  Peripheral pulses are palpable. Psych: Alert, awake and oriented, normal mood CNS:  No cranial nerve deficits.  Power equal in all extremities.   Skin: Warm and dry.  No rashes noted.   Data Review: I have personally reviewed the following laboratory data and studies,  CBC: Recent Labs  Lab 09/02/20 0122 09/03/20 0233 09/04/20 0512 09/05/20 0334  WBC 8.0 5.0 4.0 5.7  NEUTROABS 5.9  --   --   --   HGB 11.7* 10.9* 9.8* 11.4*  HCT 34.9* 34.1* 32.2* 36.6  MCV 85.5 89.3 91.5 92.7  PLT 227 201 221 244   Basic Metabolic Panel: Recent Labs  Lab 09/02/20 0122 09/02/20 1351 09/03/20 0233 09/03/20 1621 09/04/20 0512 09/05/20 0334  NA 133*  --  139 137 142 140  K 2.9*  --  2.4* 3.8 3.1* 5.0  CL 98  --  106 109 111 111  CO2 24  --  23 24 25 22   GLUCOSE 111*  --  127* 87 85 211*  BUN 14  --  7 5* 8 7  CREATININE 0.56  --  0.72 0.52 0.65 0.71  CALCIUM 8.7*  --  8.3* 7.9* 8.2* 8.9  MG  --  2.0  --   --  2.1 2.1   Liver Function Tests: Recent Labs  Lab 09/02/20 0122 09/02/20 1743  AST 25  --   ALT 29  --   ALKPHOS 113  --   BILITOT 0.8  --   PROT 7.0  --   ALBUMIN 3.5 3.3*   No results for input(s): LIPASE, AMYLASE in the last 168 hours. No results for input(s): AMMONIA in the last 168 hours. Cardiac Enzymes: No results for input(s): CKTOTAL, CKMB, CKMBINDEX, TROPONINI in the last 168 hours. BNP (last 3 results) No results for input(s): BNP in the last 8760 hours.  ProBNP (last 3 results) No results for input(s): PROBNP in the last 8760 hours.  CBG: Recent Labs  Lab 09/04/20 0825 09/05/20 0641  GLUCAP 129* 114*   Recent Results (from the past 240 hour(s))  Resp Panel by RT-PCR (Flu A&B, Covid) Nasopharyngeal Swab     Status: None   Collection Time: 09/02/20  1:06 AM    Specimen: Nasopharyngeal Swab; Nasopharyngeal(NP) swabs in vial transport medium  Result Value Ref Range Status   SARS Coronavirus 2 by RT PCR NEGATIVE NEGATIVE Final    Comment: (NOTE) SARS-CoV-2 target nucleic acids are NOT DETECTED.  The SARS-CoV-2 RNA is generally detectable in upper respiratory specimens during the acute phase of infection. The lowest concentration of SARS-CoV-2 viral copies this assay can detect is 138 copies/mL. A negative result does not preclude SARS-Cov-2 infection and should not be used as the sole basis for treatment or other patient management decisions. A negative result may occur with  improper specimen collection/handling, submission of specimen other than nasopharyngeal swab, presence of viral mutation(s) within the areas targeted by  this assay, and inadequate number of viral copies(<138 copies/mL). A negative result must be combined with clinical observations, patient history, and epidemiological information. The expected result is Negative.  Fact Sheet for Patients:  EntrepreneurPulse.com.au  Fact Sheet for Healthcare Providers:  IncredibleEmployment.be  This test is no t yet approved or cleared by the [place] FDA and  has been authorized for detection and/or diagnosis of SARS-CoV-2 by FDA under an Emergency Use Authorization (EUA). This EUA will remain  in effect (meaning this test can be used) for the duration of the COVID-19 declaration under Section 564(b)(1) of the Act, 21 U.S.C.section 360bbb-3(b)(1), unless the authorization is terminated  or revoked sooner.       Influenza A by PCR NEGATIVE NEGATIVE Final   Influenza B by PCR NEGATIVE NEGATIVE Final    Comment: (NOTE) The Xpert Xpress SARS-CoV-2/FLU/RSV plus assay is intended as an aid in the diagnosis of influenza from Nasopharyngeal swab specimens and should not be used as a sole basis for treatment. Nasal washings and aspirates are  unacceptable for Xpert Xpress SARS-CoV-2/FLU/RSV testing.  Fact Sheet for Patients: EntrepreneurPulse.com.au  Fact Sheet for Healthcare Providers: IncredibleEmployment.be  This test is not yet approved or cleared by the [place] FDA and has been authorized for detection and/or diagnosis of SARS-CoV-2 by FDA under an Emergency Use Authorization (EUA). This EUA will remain in effect (meaning this test can be used) for the duration of the COVID-19 declaration under Section 564(b)(1) of the Act, 21 U.S.C. section 360bbb-3(b)(1), unless the authorization is terminated or revoked.  Performed at [facility], [place] [address] [place], [place] [address]   CSF culture     Status: None (Preliminary result)   Collection Time: 09/02/20  6:55 PM   Specimen: CSF; Cerebrospinal Fluid  Result Value Ref Range Status   Specimen Description CSF  Final   Special Requests NONE  Final   Gram Stain NO WBC SEEN NO ORGANISMS SEEN CYTOSPIN SMEAR   Final   Culture   Final    NO GROWTH 3 DAYS Performed at [facility], [place] [address], [place], [place] [address]    Report Status PENDING  Incomplete     Studies: No results found.   Flora Lipps, MD  [facility] 09/05/2020  If 7PM-7AM, please contact night-coverage

## 2020-09-06 LAB — BASIC METABOLIC PANEL
Anion gap: 4 — ABNORMAL LOW (ref 5–15)
BUN: 11 mg/dL (ref 6–20)
CO2: 23 mmol/L (ref 22–32)
Calcium: 8.5 mg/dL — ABNORMAL LOW (ref 8.9–10.3)
Chloride: 117 mmol/L — ABNORMAL HIGH (ref 98–111)
Creatinine, Ser: 0.64 mg/dL (ref 0.44–1.00)
GFR, Estimated: >60 mL/min (ref >60)
Glucose, Bld: 125 mg/dL — ABNORMAL HIGH (ref 70–99)
Potassium: 5.5 mmol/L — ABNORMAL HIGH (ref 3.5–5.1)
Sodium: 144 mmol/L (ref 135–145)

## 2020-09-06 LAB — GLUCOSE, CAPILLARY
Glucose-Capillary: 107 mg/dL — ABNORMAL HIGH (ref 70–99)
Glucose-Capillary: 88 mg/dL (ref 70–99)
Glucose-Capillary: 149 mg/dL — ABNORMAL HIGH (ref 70–99)

## 2020-09-06 LAB — CSF CULTURE W GRAM STAIN
Culture: NO GROWTH
Gram Stain: NONE SEEN

## 2020-09-06 LAB — MAGNESIUM: Magnesium: 2.3 mg/dL (ref 1.7–2.4)

## 2020-09-06 LAB — CBC
HCT: 32.8 % — ABNORMAL LOW (ref 36.0–46.0)
Hemoglobin: 10.2 g/dL — ABNORMAL LOW (ref 12.0–15.0)
MCH: 28.5 pg (ref 26.0–34.0)
MCHC: 31.1 g/dL (ref 30.0–36.0)
MCV: 91.6 fL (ref 80.0–100.0)
Platelets: 304 10*3/uL (ref 150–400)
RBC: 3.58 MIL/uL — ABNORMAL LOW (ref 3.87–5.11)
RDW: 20.8 % — ABNORMAL HIGH (ref 11.5–15.5)
WBC: 7.7 10*3/uL (ref 4.0–10.5)
nRBC: 0 % (ref 0.0–0.2)

## 2020-09-06 MED ORDER — TIZANIDINE HCL 4 MG PO TABS
8.0000 mg | ORAL_TABLET | Freq: Four times a day (QID) | ORAL | Status: DC | PRN
  Administered 2020-09-06 – 2020-09-08 (×5): 8 mg via ORAL
  Filled 2020-09-06 (×6): qty 2

## 2020-09-06 NOTE — Plan of Care (Signed)

## 2020-09-06 NOTE — Evaluation (Signed)
Occupational Therapy Evaluation Patient Details Name: Cindy Hoffman MRN: [id number] DOB: [birth year] Today's Date: 09/06/2020    History of Present Illness 45 y/o female presented to ED on 5/3 for further evaluation after being referred from behavioral health. Patient has history of seizure disorder and anxiety. She was recently IVC by father on 5/2 due to hallucinations. MRI abnormal with  ill-defined areas of white matter hyperintensity bilaterally more prominent in left frontal, occipital and parietal lobes which do not necessarily fit any typical pattern. PMH: anxiety, fibromyalgia, migraine, GERD, occipital neuralgia, OSA, thyroid disease, hallucination, seizures   Clinical Impression   Patient admitted for the diagnosis above.  PTA she was living at home with her father, who is able to provide assist as needed.  Patient at baseline admits to fatigue with longer than household distances at [facility] level outside, drives and did not needs assist with ADL, but appears to struggle with medication compliance.  Barriers are listed below.  Currently she is close to her baseline for mobility and ADL completion.  She has a follow up MRI scheduled, and given vision deficits, OT will monitor her status while she is in the acute setting, in case of new findings or changes in her functional status.  Short blessed test was administered, but Pillbox test can be considered.  No follow OT post acute is anticipated.      Follow Up Recommendations  No OT follow up    Equipment Recommendations  None recommended by OT    Recommendations for Other Services       Precautions / Restrictions Precautions Precautions: Fall Precaution Comments: recurrent falls Restrictions Weight Bearing Restrictions: No      Mobility Bed Mobility Overal bed mobility: Independent                  Transfers Overall transfer level: Modified independent Equipment used: None             General transfer comment:  Reaching for objects in her environment.    Balance Overall balance assessment: Mild deficits observed, not formally tested                                         ADL either performed or assessed with clinical judgement   ADL Overall ADL's : At baseline                                             Vision Baseline Vision/History: Wears glasses Patient Visual Report: Blurring of vision Additional Comments: self reports blurred vision, worse in the R eye.  Glasses are not at the hospital, but patient notes this blurred vision is over and above baseline.     Perception     Praxis      Pertinent Vitals/Pain Pain Assessment: Faces Faces Pain Scale: Hurts even more Pain Location: headache Pain Descriptors / Indicators: Headache;Pounding;Pressure Pain Intervention(s): Monitored during session     Hand Dominance Right   Extremity/Trunk Assessment Upper Extremity Assessment Upper Extremity Assessment: Overall WFL for tasks assessed   Lower Extremity Assessment Lower Extremity Assessment: Defer to PT evaluation   Cervical / Trunk Assessment Cervical / Trunk Assessment: Normal   Communication Communication Communication: No difficulties   Cognition Arousal/Alertness: Awake/alert Behavior During Therapy: WFL for tasks assessed/performed  Overall Cognitive Status: Within Functional Limits for tasks assessed                                 General Comments: SBT administered.  Patient scored: 4/28 indicating normal cognition.  difficulty with remote recall.   General Comments       Exercises     Shoulder Instructions      Home Living Family/patient expects to be discharged to:: Private residence Living Arrangements: Parent Available Help at Discharge: Family;Available 24 hours/day Type of Home: House Home Access: Stairs to enter [employer] of Steps: 2 Entrance Stairs-Rails: None Home Layout: One level      Bathroom Shower/Tub: Teacher, early years/pre: Standard     Home Equipment: Cane - single point   Additional Comments: self reports 5 falls in the past month      Prior Functioning/Environment Level of Independence: Independent with assistive device(s)        Comments: uses SPC in community.  basline fatigue with long distances, typically household distances.  No assist with ADL/IADL, patient drives and attempts to manage medications.        OT Problem List: Impaired balance (sitting and/or standing);Impaired vision/perception;Pain      OT Treatment/Interventions: Self-care/ADL training;Therapeutic activities;Balance training;Visual/perceptual remediation/compensation    OT Goals(Current goals can be found in the care plan section) Acute Rehab OT Goals Patient Stated Goal: Wants to understand what is happening, and why she is here OT Goal Formulation: With patient Time For Goal Achievement: 09/20/20 Potential to Achieve Goals: Good ADL Goals Pt Will Perform Lower Body Bathing: Independently;sit to/from stand Pt Will Perform Lower Body Dressing: Independently;sit to/from stand  OT Frequency: Min 2X/week   Barriers to D/C:    none noted       Co-evaluation              AM-PAC OT "6 Clicks" Daily Activity     Outcome Measure Help from another person eating meals?: None Help from another person taking care of personal grooming?: None Help from another person toileting, which includes using toliet, bedpan, or urinal?: None Help from another person bathing (including washing, rinsing, drying)?: None Help from another person to put on and taking off regular upper body clothing?: None Help from another person to put on and taking off regular lower body clothing?: None 6 Click Score: 24   End of Session Nurse Communication: Patient requests pain meds  Activity Tolerance: Patient tolerated treatment well Patient left: in bed;with call bell/phone within  reach;with family/visitor present  OT Visit Diagnosis: Low vision, both eyes (H54.2);Unsteadiness on feet (R26.81);Repeated falls (R29.6)                Time: 0539-7673 OT Time Calculation (min): 22 min Charges:  OT General Charges $OT Visit: 1 Visit OT Evaluation $OT Eval Moderate Complexity: 1 Mod  09/06/2020  Rich, OTR/L  Acute Rehabilitation Services  Office:  [place] 09/06/2020, 1:24 PM

## 2020-09-06 NOTE — Progress Notes (Signed)
PROGRESS NOTE  KAYELA HUMPHRES ENI:[id number] DOB: Feb 05, 1976 DOA: 09/02/2020 PCP: Berkley Harvey, NP   LOS: 4 days   Brief narrative:  Cindy Hoffman is a 45 y.o. female with medical history significant of  chronic pain syndrome, obesity, asthma presented to hospital with hallucination and seizures.  She also complained of having some falls and blurred vision.  She had stopped some of her medications and continued to have hallucinations..  Patient was then IVCD and was brought into the behavioral health unit.  Patient was thought to have  some medical issues, so she was referred to the emergency department.  MRI of the brain done in the ED showed bilateral white matter abnormalities with differentials including demyelinating disease with acute on chronic encephalomyelitis,PML.  Neurology was consulted and underwent lumbar puncture.  Patient was then admitted to hospital for further evaluation and treatment.  During hospitalization, patient has been given IV high-dose steroids including IV thiamine.  Neurology following.  Assessment/Plan:  Principal Problem:   MRI of brain abnormal Active Problems:   Hallucinations   Encephalopathy   Seizure disorder (HCC)  Abnormal MRI of brain, encephalopathy, hallucinations - Hallucinations have improved.    Differential diagnosis included  demyelinating disease, acute on chronic enterocolitis, PML.   Neurology on board..  Lumbar puncture without any infective pathology.  Neurology to follow-up with further labs including oligoclonal bands, IgG CSF index..  CSF culture with no white cells or organisms, no growth so far.  Patient is currently on high dose IV Solu-Medrol and thiamine.  We will continue to monitor blood glucose levels while on high-dose steroids.  Communicated with neurology for follow-up.  Neurology recommended to continue with steroids.  Seizure disorder - EEG was performed.  EEG was negative for seizure-like activity.  No further  seizure-like activity has been reported during hospitalization.  Significant hypokalemia.  Improved with replacement.  Potassium of 5.5 today.  Supplements have been discontinued  Anxiety, fibromyalgia, migraine headache, depression.  Patient is on multiple medications as outpatient including amitriptyline, Lexapro, gabapentin, Atarax, Ativan and tizanidine including Topamax.    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  Possibility of thiamine deficiency.  On IV high-dose thiamine as per neurology.  Follow thiamine levels.  Still pending  hypomagnesemia -improved after replacement.  DVT prophylaxis: enoxaparin (LOVENOX) injection 40 mg Start: 09/02/20 2100   Code Status: Full code  Family Communication: Spoke with patient's significant other at bedside  Status is: Inpatient  Remains inpatient appropriate because:Ongoing diagnostic testing needed not appropriate for outpatient work up, IV treatments appropriate due to intensity of illness or inability to take PO and Inpatient level of care appropriate due to severity of illness   Dispo: The patient is from: Home              Anticipated d/c is to: Home, follow neurology recommendations              Patient currently is not medically stable to d/c.   Difficult to place patient No  Consultants:  Neurology  Procedures:  EEG  Lumbar puncture  Anti-infectives:  . None  Anti-infectives (From admission, onward)   None     Subjective: Today, patient was seen and examined at bedside.   Patient is still complains of headache.  Wishes to go home and is missing her daughter.  Wishes to go with strong in her brain.   Objective: Vitals:   09/05/20 1949 09/05/20 2333  BP: 139/84 109/72  Pulse: 99 87  Resp: 18 18  Temp: 98.3 F (36.8 C) 98.2 F (36.8 C)  SpO2: 98% 97%   No intake or output data in the 24 hours ending 09/06/20 0825 Filed Weights   09/03/20 0117  Weight: 81.6 kg   Body mass index is 30.9 kg/m.    Physical Exam: General: Obese built, not in obvious distress but anxious at times, HENT:   No scleral pallor or icterus noted. Oral mucosa is moist.  Chest:  Clear breath sounds.  Diminished breath sounds bilaterally. No crackles or wheezes.  CVS: S1 &S2 heard. No murmur.  Regular rate and rhythm. Abdomen: Soft, nontender, nondistended.  Bowel sounds are heard.   Extremities: No cyanosis, clubbing or edema.  Peripheral pulses are palpable. Psych: Alert, awake and oriented, communicative CNS:  No cranial nerve deficits.  Power equal in all extremities.   Skin: Warm and dry.  No rashes noted.   Data Review: I have personally reviewed the following laboratory data and studies,  CBC: Recent Labs  Lab 09/02/20 0122 09/03/20 0233 09/04/20 0512 09/05/20 0334 09/06/20 0149  WBC 8.0 5.0 4.0 5.7 7.7  NEUTROABS 5.9  --   --   --   --   HGB 11.7* 10.9* 9.8* 11.4* 10.2*  HCT 34.9* 34.1* 32.2* 36.6 32.8*  MCV 85.5 89.3 91.5 92.7 91.6  PLT 227 201 221 301 601   Basic Metabolic Panel: Recent Labs  Lab 09/02/20 1351 09/03/20 0233 09/03/20 1621 09/04/20 0512 09/05/20 0334 09/06/20 0149  NA  --  139 137 142 140 144  K  --  2.4* 3.8 3.1* 5.0 5.5*  CL  --  106 109 111 111 117*  CO2  --  23 24 25 22 23   GLUCOSE  --  127* 87 85 211* 125*  BUN  --  7 5* 8 7 11   CREATININE  --  0.72 0.52 0.65 0.71 0.64  CALCIUM  --  8.3* 7.9* 8.2* 8.9 8.5*  MG 2.0  --   --  2.1 2.1 2.3   Liver Function Tests: Recent Labs  Lab 09/02/20 0122 09/02/20 1743  AST 25  --   ALT 29  --   ALKPHOS 113  --   BILITOT 0.8  --   PROT 7.0  --   ALBUMIN 3.5 3.3*   No results for input(s): LIPASE, AMYLASE in the last 168 hours. No results for input(s): AMMONIA in the last 168 hours. Cardiac Enzymes: No results for input(s): CKTOTAL, CKMB, CKMBINDEX, TROPONINI in the last 168 hours. BNP (last 3 results) No results for input(s): BNP in the last 8760 hours.  ProBNP (last 3 results) No results for input(s):  PROBNP in the last 8760 hours.  CBG: Recent Labs  Lab 09/04/20 0825 09/05/20 0641 09/05/20 1733 09/06/20 0653  GLUCAP 129* 114* 169* 107*   Recent Results (from the past 240 hour(s))  Resp Panel by RT-PCR (Flu A&B, Covid) Nasopharyngeal Swab     Status: None   Collection Time: 09/02/20  1:06 AM   Specimen: Nasopharyngeal Swab; Nasopharyngeal(NP) swabs in vial transport medium  Result Value Ref Range Status   SARS Coronavirus 2 by RT PCR NEGATIVE NEGATIVE Final    Comment: (NOTE) SARS-CoV-2 target nucleic acids are NOT DETECTED.  The SARS-CoV-2 RNA is generally detectable in upper respiratory specimens during the acute phase of infection. The lowest concentration of SARS-CoV-2 viral copies this assay can detect is 138 copies/mL. A negative result does not preclude SARS-Cov-2 infection and should not be used  as the sole basis for treatment or other patient management decisions. A negative result may occur with  improper specimen collection/handling, submission of specimen other than nasopharyngeal swab, presence of viral mutation(s) within the areas targeted by this assay, and inadequate number of viral copies(<138 copies/mL). A negative result must be combined with clinical observations, patient history, and epidemiological information. The expected result is Negative.  Fact Sheet for Patients:  EntrepreneurPulse.com.au  Fact Sheet for Healthcare Providers:  IncredibleEmployment.be  This test is no t yet approved or cleared by the [place] FDA and  has been authorized for detection and/or diagnosis of SARS-CoV-2 by FDA under an Emergency Use Authorization (EUA). This EUA will remain  in effect (meaning this test can be used) for the duration of the COVID-19 declaration under Section 564(b)(1) of the Act, 21 U.S.C.section 360bbb-3(b)(1), unless the authorization is terminated  or revoked sooner.       Influenza A by PCR NEGATIVE  NEGATIVE Final   Influenza B by PCR NEGATIVE NEGATIVE Final    Comment: (NOTE) The Xpert Xpress SARS-CoV-2/FLU/RSV plus assay is intended as an aid in the diagnosis of influenza from Nasopharyngeal swab specimens and should not be used as a sole basis for treatment. Nasal washings and aspirates are unacceptable for Xpert Xpress SARS-CoV-2/FLU/RSV testing.  Fact Sheet for Patients: EntrepreneurPulse.com.au  Fact Sheet for Healthcare Providers: IncredibleEmployment.be  This test is not yet approved or cleared by the [place] FDA and has been authorized for detection and/or diagnosis of SARS-CoV-2 by FDA under an Emergency Use Authorization (EUA). This EUA will remain in effect (meaning this test can be used) for the duration of the COVID-19 declaration under Section 564(b)(1) of the Act, 21 U.S.C. section 360bbb-3(b)(1), unless the authorization is terminated or revoked.  Performed at [facility], [place] [address] [place], [place] [address]   CSF culture     Status: None (Preliminary result)   Collection Time: 09/02/20  6:55 PM   Specimen: CSF; Cerebrospinal Fluid  Result Value Ref Range Status   Specimen Description CSF  Final   Special Requests NONE  Final   Gram Stain NO WBC SEEN NO ORGANISMS SEEN CYTOSPIN SMEAR   Final   Culture   Final    NO GROWTH 3 DAYS Performed at [facility], [place] [address] [place], [place] [address]    Report Status PENDING  Incomplete     Studies: No results found.   Flora Lipps, MD  [facility] 09/06/2020  If 7PM-7AM, please contact night-coverage

## 2020-09-06 NOTE — Evaluation (Signed)
Physical Therapy Evaluation Patient Details Name: Cindy Hoffman MRN: [id number] DOB: October 31, 1975 Today's Date: 09/06/2020   History of Present Illness  45 y/o female presented to ED on 5/3 for further evaluation after being referred from behavioral health. Patient has history of seizure disorder and anxiety. She was recently IVC by father on 5/2 due to hallucinations. MRI abnormal with  ill-defined areas of white matter hyperintensity bilaterally more prominent in left frontal, occipital and parietal lobes which do not necessarily fit any typical pattern. PMH: anxiety, fibromyalgia, migraine, GERD, occipital neuralgia, OSA, thyroid disease, hallucination, seizures  Clinical Impression  PTA, patient lives with father and reports independence with SPC, however reports recurrent falls and 5 falls in the past month. Patient presents with generalized weakness, decreased activity tolerance, and impaired balance. Patient overall supervision for OOB mobility with no AD until fatigued which requires min guard for safety. MinA for stair negotiation with and without rails. Patient will benefit from skilled PT services during acute stay to address listed deficits. Recommend OPPT following discharge to continue addressing balance and strength deficits.     Follow Up Recommendations Outpatient PT;Supervision for mobility/OOB    Equipment Recommendations  None recommended by PT    Recommendations for Other Services       Precautions / Restrictions Precautions Precautions: Fall Precaution Comments: recurrent falls Restrictions Weight Bearing Restrictions: No      Mobility  Bed Mobility Overal bed mobility: Modified Independent                  Transfers Overall transfer level: Modified independent Equipment used: None                Ambulation/Gait Ambulation/Gait assistance: Supervision;Min guard Gait Distance (Feet): 150 Feet Assistive device: None Gait Pattern/deviations:  Step-through pattern;Decreased stride length;Drifts right/left Gait velocity: decreased   General Gait Details: Supervision initially for safety. With fatigue, min guard due to patient reports fatigue  Stairs Stairs: Yes Stairs assistance: Min assist Stair Management: No rails;Step to pattern;Forwards;One rail Left Number of Stairs: 2 General stair comments: Initially with no rails requiring minA for balance. With use of single rail, continue requiring minA  Wheelchair Mobility    Modified Rankin (Stroke Patients Only)       Balance Overall balance assessment: Mild deficits observed, not formally tested                                           Pertinent Vitals/Pain Pain Assessment: Faces Faces Pain Scale: Hurts little more Pain Location: R hip Pain Descriptors / Indicators: Aching Pain Intervention(s): Monitored during session    Home Living Family/patient expects to be discharged to:: Private residence Living Arrangements: Parent Available Help at Discharge: Family;Available 24 hours/day Type of Home: House Home Access: Stairs to enter Entrance Stairs-Rails: None (plans for building rails) Entrance Stairs-Number of Steps: 2 Home Layout: One level Home Equipment: Cane - single point Additional Comments: reports 5 falls in the past month    Prior Function Level of Independence: Independent with assistive device(s)         Comments: uses SPC in community     Hand Dominance        Extremity/Trunk Assessment   Upper Extremity Assessment Upper Extremity Assessment: Defer to OT evaluation    Lower Extremity Assessment Lower Extremity Assessment: Generalized weakness (functional weakness)    Cervical / Trunk Assessment Cervical /  Trunk Assessment: Normal  Communication   Communication: No difficulties  Cognition Arousal/Alertness: Awake/alert Behavior During Therapy: WFL for tasks assessed/performed Overall Cognitive Status: Within  Functional Limits for tasks assessed                                 General Comments: hx of hallucinations at baseline      General Comments      Exercises     Assessment/Plan    PT Assessment Patient needs continued PT services  PT Problem List Decreased strength;Decreased activity tolerance;Decreased balance;Decreased mobility;Decreased safety awareness       PT Treatment Interventions DME instruction;Gait training;Stair training;Functional mobility training;Therapeutic exercise;Therapeutic activities;Balance training;Patient/family education    PT Goals (Current goals can be found in the Care Plan section)  Acute Rehab PT Goals Patient Stated Goal: to go home to see daughter PT Goal Formulation: With patient Time For Goal Achievement: 09/20/20 Potential to Achieve Goals: Good    Frequency Min 3X/week   Barriers to discharge        Co-evaluation               AM-PAC PT "6 Clicks" Mobility  Outcome Measure Help needed turning from your back to your side while in a flat bed without using bedrails?: None Help needed moving from lying on your back to sitting on the side of a flat bed without using bedrails?: None Help needed moving to and from a bed to a chair (including a wheelchair)?: A Little Help needed standing up from a chair using your arms (e.g., wheelchair or bedside chair)?: A Little Help needed to walk in hospital room?: A Little Help needed climbing 3-5 steps with a railing? : A Little 6 Click Score: 20    End of Session Equipment Utilized During Treatment: Gait belt Activity Tolerance: Patient tolerated treatment well Patient left: in bed;with call bell/phone within reach;with family/visitor present Nurse Communication: Mobility status;Patient requests pain meds PT Visit Diagnosis: Unsteadiness on feet (R26.81);Muscle weakness (generalized) (M62.81);Repeated falls (R29.6);Other abnormalities of gait and mobility (R26.89)    Time:  4696-2952 PT Time Calculation (min) (ACUTE ONLY): 31 min   Charges:   PT Evaluation $PT Eval Low Complexity: 1 Low PT Treatments $Therapeutic Activity: 8-22 mins        Cindy Hoffman Rile PT, DPT Acute Rehabilitation Services Pager [phone] Office [phone]   Linna Hoff 09/06/2020, 10:56 AM

## 2020-09-07 DIAGNOSIS — R443 Hallucinations, unspecified: Secondary | ICD-10-CM

## 2020-09-07 LAB — CBC
HCT: 32.3 % — ABNORMAL LOW (ref 36.0–46.0)
Hemoglobin: 10.1 g/dL — ABNORMAL LOW (ref 12.0–15.0)
MCH: 28.5 pg (ref 26.0–34.0)
MCHC: 31.3 g/dL (ref 30.0–36.0)
MCV: 91.2 fL (ref 80.0–100.0)
Platelets: 300 10*3/uL (ref 150–400)
RBC: 3.54 MIL/uL — ABNORMAL LOW (ref 3.87–5.11)
RDW: 20.9 % — ABNORMAL HIGH (ref 11.5–15.5)
WBC: 6.7 10*3/uL (ref 4.0–10.5)
nRBC: 0 % (ref 0.0–0.2)

## 2020-09-07 LAB — BASIC METABOLIC PANEL
Anion gap: 10 (ref 5–15)
BUN: 11 mg/dL (ref 6–20)
CO2: 22 mmol/L (ref 22–32)
Calcium: 8.3 mg/dL — ABNORMAL LOW (ref 8.9–10.3)
Chloride: 108 mmol/L (ref 98–111)
Creatinine, Ser: 0.65 mg/dL (ref 0.44–1.00)
GFR, Estimated: >60 mL/min (ref >60)
Glucose, Bld: 177 mg/dL — ABNORMAL HIGH (ref 70–99)
Potassium: 3.2 mmol/L — ABNORMAL LOW (ref 3.5–5.1)
Sodium: 140 mmol/L (ref 135–145)

## 2020-09-07 LAB — GLUCOSE, CAPILLARY
Glucose-Capillary: 88 mg/dL (ref 70–99)
Glucose-Capillary: 96 mg/dL (ref 70–99)

## 2020-09-07 LAB — MAGNESIUM: Magnesium: 2.4 mg/dL (ref 1.7–2.4)

## 2020-09-07 MED ORDER — POTASSIUM CHLORIDE CRYS ER 20 MEQ PO TBCR
40.0000 meq | EXTENDED_RELEASE_TABLET | Freq: Once | ORAL | Status: AC
  Administered 2020-09-07: 40 meq via ORAL
  Filled 2020-09-07: qty 2

## 2020-09-07 NOTE — Progress Notes (Signed)
Subjective: Complains of a headache. Has not had any visual/auditory hallucinations since prior to the steroids.   Objective: Current vital signs: BP (!) 129/92 (BP Location: Left Arm)   Pulse 86   Temp 97.7 F (36.5 C) (Oral)   Resp 16   Ht 5\' 4"  (1.626 m)   Wt 81.6 kg   SpO2 99%   BMI 30.90 kg/m  Vital signs in last 24 hours: Temp:  [97.5 F (36.4 C)-98.4 F (36.9 C)] 97.7 F (36.5 C) (05/08 0748) Pulse Rate:  [79-101] 86 (05/08 0748) Resp:  [16-19] 16 (05/08 0752) BP: (117-136)/(65-92) 129/92 (05/08 0748) SpO2:  [97 %-100 %] 99 % (05/08 0752)  Intake/Output from previous day: 05/07 0701 - 05/08 0700 In: 1292 [P.O.:600; I.V.:118; IV Piggyback:574] Out: -  Intake/Output this shift: No intake/output data recorded. Nutritional status:  Diet Order            Diet Heart Room service appropriate? Yes; Fluid consistency: Thin  Diet effective now                HEENT: [place]/AT Lungs: Respirations unlabored Ext: No edema  Neurologic Exam: Mental Status: Awake, alert and fully oriented. Pressured speech is noted, which is new since prior assessment. Speech is fluent with intact comprehension.  Cranial Nerves: Tracks and fixates normally. EOMI. Face is symmetric. Phonation intact.  Motor: Moves all 4 extremities with normal strength Cerebellar: No ataxia noted.   Lab Results: Results for orders placed or performed during the hospital encounter of 09/02/20 (from the past 48 hour(s))  Glucose, capillary     Status: Abnormal   Collection Time: 09/05/20  5:33 PM  Result Value Ref Range   Glucose-Capillary 169 (H) 70 - 99 mg/dL    Comment: Glucose reference range applies only to samples taken after fasting for at least 8 hours.  Magnesium     Status: None   Collection Time: 09/06/20  1:49 AM  Result Value Ref Range   Magnesium 2.3 1.7 - 2.4 mg/dL    Comment: Performed at [facility] [address] [place], [place] [address]  Basic metabolic panel     Status:  Abnormal   Collection Time: 09/06/20  1:49 AM  Result Value Ref Range   Sodium 144 135 - 145 mmol/L   Potassium 5.5 (H) 3.5 - 5.1 mmol/L   Chloride 117 (H) 98 - 111 mmol/L   CO2 23 22 - 32 mmol/L   Glucose, Bld 125 (H) 70 - 99 mg/dL    Comment: Glucose reference range applies only to samples taken after fasting for at least 8 hours.   BUN 11 6 - 20 mg/dL   Creatinine, Ser 0.64 0.44 - 1.00 mg/dL   Calcium 8.5 (L) 8.9 - 10.3 mg/dL   GFR, Estimated >60 >60 mL/min    Comment: (NOTE) Calculated using the CKD-EPI Creatinine Equation (2021)    Anion gap 4 (L) 5 - 15    Comment: Performed at [facility] [address], [place], [place] [address]  CBC     Status: Abnormal   Collection Time: 09/06/20  1:49 AM  Result Value Ref Range   WBC 7.7 4.0 - 10.5 K/uL   RBC 3.58 (L) 3.87 - 5.11 MIL/uL   Hemoglobin 10.2 (L) 12.0 - 15.0 g/dL   HCT 32.8 (L) 36.0 - 46.0 %   MCV 91.6 80.0 - 100.0 fL   MCH 28.5 26.0 - 34.0 pg   MCHC 31.1 30.0 - 36.0  g/dL   RDW 20.8 (H) 11.5 - 15.5 %   Platelets 304 150 - 400 K/uL   nRBC 0.0 0.0 - 0.2 %    Comment: Performed at [facility], [place] [address], [place], [place] [address]  Glucose, capillary     Status: Abnormal   Collection Time: 09/06/20  6:53 AM  Result Value Ref Range   Glucose-Capillary 107 (H) 70 - 99 mg/dL    Comment: Glucose reference range applies only to samples taken after fasting for at least 8 hours.  Glucose, capillary     Status: None   Collection Time: 09/06/20 12:07 PM  Result Value Ref Range   Glucose-Capillary 88 70 - 99 mg/dL    Comment: Glucose reference range applies only to samples taken after fasting for at least 8 hours.  Glucose, capillary     Status: Abnormal   Collection Time: 09/06/20  6:19 PM  Result Value Ref Range   Glucose-Capillary 149 (H) 70 - 99 mg/dL    Comment: Glucose reference range applies only to samples taken after fasting for at least 8 hours.  Magnesium     Status: None   Collection Time:  09/07/20  2:57 AM  Result Value Ref Range   Magnesium 2.4 1.7 - 2.4 mg/dL    Comment: Performed at [facility] [address] [place] [address], [place], [place] [id number]  Basic metabolic panel     Status: Abnormal   Collection Time: 09/07/20  2:57 AM  Result Value Ref Range   Sodium 140 135 - 145 mmol/L   Potassium 3.2 (L) 3.5 - 5.1 mmol/L   Chloride 108 98 - 111 mmol/L   CO2 22 22 - 32 mmol/L   Glucose, Bld 177 (H) 70 - 99 mg/dL    Comment: Glucose reference range applies only to samples taken after fasting for at least 8 hours.   BUN 11 6 - 20 mg/dL   Creatinine, Ser 0.65 0.44 - 1.00 mg/dL   Calcium 8.3 (L) 8.9 - 10.3 mg/dL   GFR, Estimated >60 >60 mL/min    Comment: (NOTE) Calculated using the CKD-EPI Creatinine Equation (2021)    Anion gap 10 5 - 15    Comment: Performed at [facility] [address], [place], [place] [address]  CBC     Status: Abnormal   Collection Time: 09/07/20  2:57 AM  Result Value Ref Range   WBC 6.7 4.0 - 10.5 K/uL   RBC 3.54 (L) 3.87 - 5.11 MIL/uL   Hemoglobin 10.1 (L) 12.0 - 15.0 g/dL   HCT 32.3 (L) 36.0 - 46.0 %   MCV 91.2 80.0 - 100.0 fL   MCH 28.5 26.0 - 34.0 pg   MCHC 31.3 30.0 - 36.0 g/dL   RDW 20.9 (H) 11.5 - 15.5 %   Platelets 300 150 - 400 K/uL   nRBC 0.0 0.0 - 0.2 %    Comment: Performed at [facility], [place] [address] [place], [place] [address]  Glucose, capillary     Status: None   Collection Time: 09/07/20  6:07 AM  Result Value Ref Range   Glucose-Capillary 88 70 - 99 mg/dL    Comment: Glucose reference range applies only to samples taken after fasting for at least 8 hours.    Recent Results (from the past 240 hour(s))  Resp Panel by RT-PCR (Flu A&B, Covid) Nasopharyngeal Swab     Status: None   Collection Time: 09/02/20  1:06 AM  Specimen: Nasopharyngeal Swab; Nasopharyngeal(NP) swabs in vial transport medium  Result Value Ref Range Status   SARS Coronavirus 2 by RT PCR NEGATIVE NEGATIVE Final    Comment:  (NOTE) SARS-CoV-2 target nucleic acids are NOT DETECTED.  The SARS-CoV-2 RNA is generally detectable in upper respiratory specimens during the acute phase of infection. The lowest concentration of SARS-CoV-2 viral copies this assay can detect is 138 copies/mL. A negative result does not preclude SARS-Cov-2 infection and should not be used as the sole basis for treatment or other patient management decisions. A negative result may occur with  improper specimen collection/handling, submission of specimen other than nasopharyngeal swab, presence of viral mutation(s) within the areas targeted by this assay, and inadequate number of viral copies(<138 copies/mL). A negative result must be combined with clinical observations, patient history, and epidemiological information. The expected result is Negative.  Fact Sheet for Patients:  EntrepreneurPulse.com.au  Fact Sheet for Healthcare Providers:  IncredibleEmployment.be  This test is no t yet approved or cleared by the [place] FDA and  has been authorized for detection and/or diagnosis of SARS-CoV-2 by FDA under an Emergency Use Authorization (EUA). This EUA will remain  in effect (meaning this test can be used) for the duration of the COVID-19 declaration under Section 564(b)(1) of the Act, 21 U.S.C.section 360bbb-3(b)(1), unless the authorization is terminated  or revoked sooner.       Influenza A by PCR NEGATIVE NEGATIVE Final   Influenza B by PCR NEGATIVE NEGATIVE Final    Comment: (NOTE) The Xpert Xpress SARS-CoV-2/FLU/RSV plus assay is intended as an aid in the diagnosis of influenza from Nasopharyngeal swab specimens and should not be used as a sole basis for treatment. Nasal washings and aspirates are unacceptable for Xpert Xpress SARS-CoV-2/FLU/RSV testing.  Fact Sheet for Patients: EntrepreneurPulse.com.au  Fact Sheet for Healthcare  Providers: IncredibleEmployment.be  This test is not yet approved or cleared by the [place] FDA and has been authorized for detection and/or diagnosis of SARS-CoV-2 by FDA under an Emergency Use Authorization (EUA). This EUA will remain in effect (meaning this test can be used) for the duration of the COVID-19 declaration under Section 564(b)(1) of the Act, 21 U.S.C. section 360bbb-3(b)(1), unless the authorization is terminated or revoked.  Performed at [facility], [place] [address] [place], [place] [address]   CSF culture     Status: None   Collection Time: 09/02/20  6:55 PM   Specimen: CSF; Cerebrospinal Fluid  Result Value Ref Range Status   Specimen Description CSF  Final   Special Requests NONE  Final   Gram Stain NO WBC SEEN NO ORGANISMS SEEN CYTOSPIN SMEAR   Final   Culture   Final    NO GROWTH 3 DAYS Performed at [facility], [place] [address] [place], [place] [address]    Report Status 09/06/2020 FINAL  Final    Lipid Panel No results for input(s): CHOL, TRIG, HDL, CHOLHDL, VLDL, LDLCALC in the last 72 hours.  Studies/Results: No results found.  Medications:  Scheduled: . amitriptyline  100 mg Oral QHS  . enoxaparin (LOVENOX) injection  40 mg Subcutaneous Q24H  . escitalopram  20 mg Oral Daily  . gabapentin  900 mg Oral TID  . magnesium oxide  400 mg Oral BID  . mometasone-formoterol  2 puff Inhalation BID  . naloxegol oxalate  25 mg Oral QHS  . oxyCODONE  10 mg Oral BID  . topiramate  200 mg Oral QHS  Continuous: . sodium chloride 100 mL/hr at 09/03/20 1330  . methylPREDNISolone (SOLU-MEDROL) injection Stopped (09/06/20 1156)  . promethazine (PHENERGAN) injection (IM or IVPB) 12.5 mg (09/06/20 1534)  . thiamine injection     Assessment:30 year oldfemalewith past medical history of migraine headaches, anxiety, depression, fibromyalgia, status post bariatric surgery, presenting for evaluation of altered mental  status and formed auditory/visual hallucinations. She initially was committed involuntarily but then released for further medical evaluation prior to any psychiatric admission, noted to have abnormal MRIbrain. -MRI BrainW/WO showedill-defined areas of white matter hyperintensity bilaterallymore prominent in left frontal, occipital and parietal lobeswhich do not necessarily fit any typical pattern. Differentials do include demyelination versus encephalitis and according to radiology also PML but it is very uncharacteristic for all of those. Not on any immunosuppressants except for steroids for asthma control. -Other differentials to consider are autoimmune encephalitides such as anti-MOG encephalitisandADEM, which can present with headaches and psychiatric features. -Serum anti-MOG antibodiesarepending  - CSF clear and colorless with normal total protein and 2 WBC. CSF with no growth x 3 days. CSF OCBs came back positive with 3 bands not also seen in serum. CSF IgG index came back positive at 0.8.  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 Although multiple sclerosis would be most likely from an epidemiological standpoint, other conditions such as anti-MOG encephalitisandADEM are also on the DDx. Of note, all 3 of the above can present with neuropsychiatric symptoms.   Recommendations:  Continue IV methylprednisolone for a total of 5 days. On day 4/5.   Continue IV high-dose thiamine..   Thiamine levels are pending.   Will need outpatient follow up with Dr. Felecia Shelling, who is a multiple sclerosis specialist at [facility].     LOS: 5 days   @Electronically  signed: Dr. Kerney Elbe 09/07/2020  9:00 AM

## 2020-09-07 NOTE — Progress Notes (Addendum)
PROGRESS NOTE  Cindy Hoffman RXV:[id number] DOB: 01/22/1976 DOA: 09/02/2020 PCP: Berkley Harvey, NP   LOS: 5 days   Brief narrative:  Cindy Hoffman is a 45 y.o. female with medical history significant of  chronic pain syndrome, obesity, asthma presented to hospital with hallucination and seizures.  She also complained of having some falls and blurred vision.  She had stopped some of her medications and continued to have hallucinations..  Patient was then IVCD and was brought into the behavioral health unit.  Patient was thought to have  some medical issues, so she was referred to the emergency department.  MRI of the brain done in the ED showed bilateral white matter abnormalities with differentials including demyelinating disease with acute on chronic encephalomyelitis,PML.  Neurology was consulted and underwent lumbar puncture.  Patient was then admitted to hospital for further evaluation and treatment.  During hospitalization, patient has been given IV high-dose steroids including IV thiamine.    Assessment/Plan:  Principal Problem:   MRI of brain abnormal Active Problems:   Hallucinations   Encephalopathy   Seizure disorder (HCC)  Abnormal MRI of brain, encephalopathy, hallucinations - Hallucinations have improved.    Differential diagnosis included  demyelinating disease, acute on chronic enterocolitis, PML.   Neurology on board.  Lumbar puncture without any infective pathology.  Abnormal oligoclonal bands, IgG CSF index likely multiple sclerosis...  CSF culture with no white cells or organisms, no growth so far.  Patient is currently on high dose IV Solu-Medrol and thiamine starting 09/04/20.  We will continue to monitor blood glucose levels while on high-dose steroids.  We will follow neurology recommendation.  Seizure disorder - EEG was performed.  EEG was negative for seizure-like activity.  No further seizure-like activity has been reported during  hospitalization.  Hypokalemia. Replenish orally.  Continue magnesium oxide.  Check levels in a.m.  Anxiety, fibromyalgia, migraine headache, depression.  Patient is on multiple medications as outpatient including amitriptyline, Lexapro, gabapentin, Atarax, Ativan and tizanidine including Topamax.  Dose of tizanidine was increased to yesterday.  Patient gets her monthly shot of migraine medication as outpatient.  She wishes to have it in the hospital.  Will inquire more about it.  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 n IV high-dose thiamine as per neurology.  Follow thiamine levels.  Still pending  hypomagnesemia -improved after replacement.  DVT prophylaxis: enoxaparin (LOVENOX) injection 40 mg Start: 09/02/20 2100   Code Status: Full code  Family Communication: I again spoke with patient's significant other at bedside  Status is: Inpatient  Remains inpatient appropriate because:Ongoing diagnostic testing needed not appropriate for outpatient work up, IV treatments appropriate due to intensity of illness or inability to take PO and Inpatient level of care appropriate due to severity of illness   Dispo: The patient is from: Home              Anticipated d/c is to: Home, follow neurology recommendations              Patient currently is not medically stable to d/c.   Difficult to place patient No  Consultants:  Neurology  Procedures:  EEG  Lumbar puncture  Anti-infectives:  . None  Anti-infectives (From admission, onward)   None     Subjective: Today, patient was seen and examined at bedside.  Patient feels less overwhelmed today.  Has history of migraine and takes monthly shots and inquiring about it.   Objective: Vitals:   09/07/20 5093 09/07/20 2671  BP: (!) 129/92   Pulse: 86   Resp: 16 16  Temp: 97.7 F (36.5 C)   SpO2: 100% 99%    Intake/Output Summary (Last 24 hours) at 09/07/2020 0823 Last data filed at 09/06/2020 1700 Gross per 24 hour  Intake  1292.03 ml  Output --  Net 1292.03 ml   Filed Weights   09/03/20 0117  Weight: 81.6 kg   Body mass index is 30.9 kg/m.   Physical Exam: General: Obese built, HENT:   No scleral pallor or icterus noted. Oral mucosa is moist.  Chest:  Clear breath sounds.  Diminished breath sounds bilaterally. No crackles or wheezes.  CVS: S1 &S2 heard. No murmur.  Regular rate and rhythm. Abdomen: Soft, nontender, nondistended.  Bowel sounds are heard.   Extremities: No cyanosis, clubbing or edema.  Peripheral pulses are palpable. Psych: Alert, awake and oriented, communicative CNS:  No cranial nerve deficits.  Power equal in all extremities.   Skin: Warm and dry.  No rashes noted.  Data Review: I have personally reviewed the following laboratory data and studies,  CBC: Recent Labs  Lab 09/02/20 0122 09/03/20 0233 09/04/20 0512 09/05/20 0334 09/06/20 0149 09/07/20 0257  WBC 8.0 5.0 4.0 5.7 7.7 6.7  NEUTROABS 5.9  --   --   --   --   --   HGB 11.7* 10.9* 9.8* 11.4* 10.2* 10.1*  HCT 34.9* 34.1* 32.2* 36.6 32.8* 32.3*  MCV 85.5 89.3 91.5 92.7 91.6 91.2  PLT 227 201 221 301 304 097   Basic Metabolic Panel: Recent Labs  Lab 09/02/20 1351 09/03/20 0233 09/03/20 1621 09/04/20 0512 09/05/20 0334 09/06/20 0149 09/07/20 0257  NA  --    < > 137 142 140 144 140  K  --    < > 3.8 3.1* 5.0 5.5* 3.2*  CL  --    < > 109 111 111 117* 108  CO2  --    < > 24 25 22 23 22   GLUCOSE  --    < > 87 85 211* 125* 177*  BUN  --    < > 5* 8 7 11 11   CREATININE  --    < > 0.52 0.65 0.71 0.64 0.65  CALCIUM  --    < > 7.9* 8.2* 8.9 8.5* 8.3*  MG 2.0  --   --  2.1 2.1 2.3 2.4   < > = values in this interval not displayed.   Liver Function Tests: Recent Labs  Lab 09/02/20 0122 09/02/20 1743  AST 25  --   ALT 29  --   ALKPHOS 113  --   BILITOT 0.8  --   PROT 7.0  --   ALBUMIN 3.5 3.3*   No results for input(s): LIPASE, AMYLASE in the last 168 hours. No results for input(s): AMMONIA in the last  168 hours. Cardiac Enzymes: No results for input(s): CKTOTAL, CKMB, CKMBINDEX, TROPONINI in the last 168 hours. BNP (last 3 results) No results for input(s): BNP in the last 8760 hours.  ProBNP (last 3 results) No results for input(s): PROBNP in the last 8760 hours.  CBG: Recent Labs  Lab 09/05/20 1733 09/06/20 0653 09/06/20 1207 09/06/20 1819 09/07/20 0607  GLUCAP 169* 107* 88 149* 88   Recent Results (from the past 240 hour(s))  Resp Panel by RT-PCR (Flu A&B, Covid) Nasopharyngeal Swab     Status: None   Collection Time: 09/02/20  1:06 AM   Specimen: Nasopharyngeal Swab; Nasopharyngeal(NP) swabs in  vial transport medium  Result Value Ref Range Status   SARS Coronavirus 2 by RT PCR NEGATIVE NEGATIVE Final    Comment: (NOTE) SARS-CoV-2 target nucleic acids are NOT DETECTED.  The SARS-CoV-2 RNA is generally detectable in upper respiratory specimens during the acute phase of infection. The lowest concentration of SARS-CoV-2 viral copies this assay can detect is 138 copies/mL. A negative result does not preclude SARS-Cov-2 infection and should not be used as the sole basis for treatment or other patient management decisions. A negative result may occur with  improper specimen collection/handling, submission of specimen other than nasopharyngeal swab, presence of viral mutation(s) within the areas targeted by this assay, and inadequate number of viral copies(<138 copies/mL). A negative result must be combined with clinical observations, patient history, and epidemiological information. The expected result is Negative.  Fact Sheet for Patients:  EntrepreneurPulse.com.au  Fact Sheet for Healthcare Providers:  IncredibleEmployment.be  This test is no t yet approved or cleared by the [place] FDA and  has been authorized for detection and/or diagnosis of SARS-CoV-2 by FDA under an Emergency Use Authorization (EUA). This EUA will remain   in effect (meaning this test can be used) for the duration of the COVID-19 declaration under Section 564(b)(1) of the Act, 21 U.S.C.section 360bbb-3(b)(1), unless the authorization is terminated  or revoked sooner.       Influenza A by PCR NEGATIVE NEGATIVE Final   Influenza B by PCR NEGATIVE NEGATIVE Final    Comment: (NOTE) The Xpert Xpress SARS-CoV-2/FLU/RSV plus assay is intended as an aid in the diagnosis of influenza from Nasopharyngeal swab specimens and should not be used as a sole basis for treatment. Nasal washings and aspirates are unacceptable for Xpert Xpress SARS-CoV-2/FLU/RSV testing.  Fact Sheet for Patients: EntrepreneurPulse.com.au  Fact Sheet for Healthcare Providers: IncredibleEmployment.be  This test is not yet approved or cleared by the [place] FDA and has been authorized for detection and/or diagnosis of SARS-CoV-2 by FDA under an Emergency Use Authorization (EUA). This EUA will remain in effect (meaning this test can be used) for the duration of the COVID-19 declaration under Section 564(b)(1) of the Act, 21 U.S.C. section 360bbb-3(b)(1), unless the authorization is terminated or revoked.  Performed at [facility], [place] [address] [place], [place] [address]   CSF culture     Status: None   Collection Time: 09/02/20  6:55 PM   Specimen: CSF; Cerebrospinal Fluid  Result Value Ref Range Status   Specimen Description CSF  Final   Special Requests NONE  Final   Gram Stain NO WBC SEEN NO ORGANISMS SEEN CYTOSPIN SMEAR   Final   Culture   Final    NO GROWTH 3 DAYS Performed at [facility], [place] [address] [place], [place] [address]    Report Status 09/06/2020 FINAL  Final     Studies: No results found.   Flora Lipps, MD  [facility] 09/07/2020  If 7PM-7AM, please contact night-coverage

## 2020-09-07 NOTE — Plan of Care (Signed)

## 2020-09-08 ENCOUNTER — Telehealth: Payer: Self-pay | Admitting: *Deleted

## 2020-09-08 ENCOUNTER — Inpatient Hospital Stay (HOSPITAL_COMMUNITY): Payer: Medicare Other

## 2020-09-08 DIAGNOSIS — G35 Multiple sclerosis: Principal | ICD-10-CM

## 2020-09-08 DIAGNOSIS — G9340 Encephalopathy, unspecified: Secondary | ICD-10-CM

## 2020-09-08 LAB — CBC
HCT: 36.8 % (ref 36.0–46.0)
Hemoglobin: 11.2 g/dL — ABNORMAL LOW (ref 12.0–15.0)
MCH: 28.2 pg (ref 26.0–34.0)
MCHC: 30.4 g/dL (ref 30.0–36.0)
MCV: 92.7 fL (ref 80.0–100.0)
Platelets: 376 10*3/uL (ref 150–400)
RBC: 3.97 MIL/uL (ref 3.87–5.11)
RDW: 20.7 % — ABNORMAL HIGH (ref 11.5–15.5)
WBC: 9.1 10*3/uL (ref 4.0–10.5)
nRBC: 0 % (ref 0.0–0.2)

## 2020-09-08 LAB — BASIC METABOLIC PANEL
Anion gap: 5 (ref 5–15)
BUN: 13 mg/dL (ref 6–20)
CO2: 28 mmol/L (ref 22–32)
Calcium: 8.6 mg/dL — ABNORMAL LOW (ref 8.9–10.3)
Chloride: 105 mmol/L (ref 98–111)
Creatinine, Ser: 0.58 mg/dL (ref 0.44–1.00)
GFR, Estimated: >60 mL/min (ref >60)
Glucose, Bld: 89 mg/dL (ref 70–99)
Potassium: 3.5 mmol/L (ref 3.5–5.1)
Sodium: 138 mmol/L (ref 135–145)

## 2020-09-08 LAB — GLUCOSE, CAPILLARY
Glucose-Capillary: 94 mg/dL (ref 70–99)
Glucose-Capillary: 102 mg/dL — ABNORMAL HIGH (ref 70–99)
Glucose-Capillary: 189 mg/dL — ABNORMAL HIGH (ref 70–99)

## 2020-09-08 LAB — MAGNESIUM: Magnesium: 2.3 mg/dL (ref 1.7–2.4)

## 2020-09-08 MED ORDER — TRAMADOL HCL 50 MG PO TABS
50.0000 mg | ORAL_TABLET | Freq: Once | ORAL | Status: AC
  Administered 2020-09-08: 50 mg via ORAL
  Filled 2020-09-08: qty 1

## 2020-09-08 MED ORDER — GADOBUTROL 1 MMOL/ML IV SOLN
8.0000 mL | Freq: Once | INTRAVENOUS | Status: AC | PRN
  Administered 2020-09-08: 8 mL via INTRAVENOUS

## 2020-09-08 MED ORDER — POTASSIUM CHLORIDE CRYS ER 20 MEQ PO TBCR
20.0000 meq | EXTENDED_RELEASE_TABLET | Freq: Every day | ORAL | 0 refills | Status: DC
Start: 2020-09-08 — End: 2021-05-21

## 2020-09-08 MED ORDER — VITAMIN D (CHOLECALCIFEROL) 10 MCG (400 UNIT) PO CAPS
1.0000 | ORAL_CAPSULE | Freq: Every day | ORAL | 0 refills | Status: DC
Start: 2020-09-08 — End: 2021-05-21

## 2020-09-08 MED ORDER — OXYCODONE HCL 10 MG PO TABS: 10.0000 mg | ORAL_TABLET | Freq: Four times a day (QID) | ORAL | 0 refills | Status: DC

## 2020-09-08 MED ORDER — MAGNESIUM 30 MG PO TABS: 30.0000 mg | ORAL_TABLET | Freq: Two times a day (BID) | ORAL | 0 refills | Status: DC

## 2020-09-08 NOTE — Progress Notes (Signed)
Occupational Therapy Treatment Patient Details Name: Cindy Hoffman MRN: [id number] DOB: 1975/12/04 Today's Date: 09/08/2020    History of present illness 45 y/o female presented to ED on 5/3 for further evaluation after being referred from behavioral health. Patient has history of seizure disorder and anxiety. She was recently IVC by father on 5/2 due to hallucinations. MRI abnormal with  ill-defined areas of white matter hyperintensity bilaterally more prominent in left frontal, occipital and parietal lobes which do not necessarily fit any typical pattern. PMH: anxiety, fibromyalgia, migraine, GERD, occipital neuralgia, OSA, thyroid disease, hallucination, seizures   OT comments  Pt making steady progress towards OT goals this session. Pt reports she is likely to return home with her dad today and reports she has needed DME. Pt likely nearing baseline level of function with pt ambulating in hallway with supervision with pt holding onto IV pole although pt reports she furniture walks at home. Pt able to retrieve items in room with no LOB or AD. DC plan currently remains appropriate, will follow acutely per POC.    Follow Up Recommendations  No OT follow up    Equipment Recommendations  None recommended by OT    Recommendations for Other Services      Precautions / Restrictions Precautions Precautions: Fall Precaution Comments: recurrent falls Restrictions Weight Bearing Restrictions: No       Mobility Bed Mobility Overal bed mobility: Independent                  Transfers Overall transfer level: Modified independent Equipment used: None             General transfer comment: sit<>stand from EOB    Balance Overall balance assessment: Mild deficits observed, not formally tested (pt retrieving items in room with no AD and no LOB)                                         ADL either performed or assessed with clinical judgement   ADL Overall  ADL's : At baseline                                       General ADL Comments: pt likely at baseline for ADLs with pt ambulating around room to retireve items, with pt dressed and completing functional mobility in hallway with pt holding onto to IV pole for support, pt likely to DC home with her father who can assist intermittently but also has his own health issues     Vision       Perception     Praxis      Cognition Arousal/Alertness: Awake/alert Behavior During Therapy: Restless;Anxious Overall Cognitive Status: No family/caregiver present to determine baseline cognitive functioning                                 General Comments: pt restless and anxious about needing her pain meds, therefore mostly perseverating on needing meds but overall [facility]        Exercises     Shoulder Instructions       General Comments      Pertinent Vitals/ Pain       Pain Assessment: Faces Faces Pain Scale: Hurts even more Pain Location: low back  and headache Pain Descriptors / Indicators: Headache;Pounding;Pressure;Discomfort;Sore;Shooting;Sharp Pain Intervention(s): Monitored during session;Repositioned;Patient requesting pain meds-RN notified  Home Living                                          Prior Functioning/Environment              Frequency  Min 2X/week        Progress Toward Goals  OT Goals(current goals can now be found in the care plan section)  Progress towards OT goals: Progressing toward goals  Acute Rehab OT Goals Patient Stated Goal: to go home today OT Goal Formulation: With patient Time For Goal Achievement: 09/20/20 Potential to Achieve Goals: Good  Plan Discharge plan remains appropriate;Frequency remains appropriate    Co-evaluation                 AM-PAC OT "6 Clicks" Daily Activity     Outcome Measure   Help from another person eating meals?: None Help from another person taking  care of personal grooming?: None Help from another person toileting, which includes using toliet, bedpan, or urinal?: None Help from another person bathing (including washing, rinsing, drying)?: None Help from another person to put on and taking off regular upper body clothing?: None Help from another person to put on and taking off regular lower body clothing?: None 6 Click Score: 24    End of Session    OT Visit Diagnosis: Low vision, both eyes (H54.2);Unsteadiness on feet (R26.81);Repeated falls (R29.6)   Activity Tolerance Patient tolerated treatment well   Patient Left in bed;with call bell/phone within reach;with family/visitor present   Nurse Communication Patient requests pain meds;Mobility status        Time: 7096-2836 OT Time Calculation (min): 8 min  Charges: OT General Charges $OT Visit: 1 Visit OT Treatments $Self Care/Home Management : 8-22 mins  Harley [facility]., COTA/L Acute Rehabilitation Services [phone]    [name] 09/08/2020, 12:46 PM

## 2020-09-08 NOTE — Progress Notes (Signed)
PT Cancellation Note  Patient Details Name: Cindy Hoffman MRN: [id number] DOB: 08-Nov-1975   Cancelled Treatment:      Pt declined PT.  Reports she just walked with OT , then is supposed to go home.  States has help and DME at home. Will f/u as able.  Abran Richard, PT Acute Rehab Services Pager [phone] [name] [phone]    Karlton Lemon 09/08/2020, 2:50 PM

## 2020-09-08 NOTE — Progress Notes (Addendum)
Subjective: Continues to complain of a headache and lack of sleep overnight. Reports high level of stress today and feeling emotional about recent test results. No visual/auditory hallucinations since prior to steroids. Per RN, patient has been sweating and very warm to touch, particularly after visits from boyfriend.  Objective: Current vital signs: BP 133/87 (BP Location: Left Arm)   Pulse 82   Temp 97.6 F (36.4 C) (Oral)   Resp 18   Ht 5\' 4"  (1.626 m)   Wt 81.6 kg   SpO2 100%   BMI 30.90 kg/m  Vital signs in last 24 hours: Temp:  [97.6 F (36.4 C)-98.3 F (36.8 C)] 97.6 F (36.4 C) (05/09 0352) Pulse Rate:  [74-104] 82 (05/09 0819) Resp:  [17-19] 18 (05/09 0819) BP: (115-133)/(62-87) 133/87 (05/09 0819) SpO2:  [99 %-100 %] 100 % (05/09 0852)  Intake/Output from previous day: 05/08 0701 - 05/09 0700 In: 208 [IV Piggyback:208] Out: -  Intake/Output this shift: No intake/output data recorded. Nutritional status:  Diet Order            Diet Heart Room service appropriate? Yes; Fluid consistency: Thin  Diet effective now                HEENT: [place]/AT Lungs: normal respiratory effort Ext: no edema or erythema  Neurologic Exam: Mental Status: Awake, alert and fully oriented. Pressured speech and emotional lability. Speech is fluent with intact comprehension. No evidence of auditory or visual hallucinations. Cranial Nerves: CNII-XII intact, no focal deficits. Tracks and fixates normally. EOMI. Face is symmetric. Phonation intact.  Motor: Moves all 4 extremities with normal strength. Reflexes are intact and equal throughout all extremities. Cerebellar: No ataxia noted.   Lab Results: Results for orders placed or performed during the hospital encounter of 09/02/20 (from the past 48 hour(s))  Glucose, capillary     Status: None   Collection Time: 09/06/20 12:07 PM  Result Value Ref Range   Glucose-Capillary 88 70 - 99 mg/dL    Comment: Glucose reference range applies  only to samples taken after fasting for at least 8 hours.  Glucose, capillary     Status: Abnormal   Collection Time: 09/06/20  6:19 PM  Result Value Ref Range   Glucose-Capillary 149 (H) 70 - 99 mg/dL    Comment: Glucose reference range applies only to samples taken after fasting for at least 8 hours.  Magnesium     Status: None   Collection Time: 09/07/20  2:57 AM  Result Value Ref Range   Magnesium 2.4 1.7 - 2.4 mg/dL    Comment: Performed at [place] [facility] [address] [place], [place] [address]  Basic metabolic panel     Status: Abnormal   Collection Time: 09/07/20  2:57 AM  Result Value Ref Range   Sodium 140 135 - 145 mmol/L   Potassium 3.2 (L) 3.5 - 5.1 mmol/L   Chloride 108 98 - 111 mmol/L   CO2 22 22 - 32 mmol/L   Glucose, Bld 177 (H) 70 - 99 mg/dL    Comment: Glucose reference range applies only to samples taken after fasting for at least 8 hours.   BUN 11 6 - 20 mg/dL   Creatinine, Ser 0.65 0.44 - 1.00 mg/dL   Calcium 8.3 (L) 8.9 - 10.3 mg/dL   GFR, Estimated >60 >60 mL/min    Comment: (NOTE) Calculated using the CKD-EPI Creatinine Equation (2021)    Anion gap 10 5 - 15    Comment: Performed  at [facility], [place] [address] [place], [place] [address]  CBC     Status: Abnormal   Collection Time: 09/07/20  2:57 AM  Result Value Ref Range   WBC 6.7 4.0 - 10.5 K/uL   RBC 3.54 (L) 3.87 - 5.11 MIL/uL   Hemoglobin 10.1 (L) 12.0 - 15.0 g/dL   HCT 32.3 (L) 36.0 - 46.0 %   MCV 91.2 80.0 - 100.0 fL   MCH 28.5 26.0 - 34.0 pg   MCHC 31.3 30.0 - 36.0 g/dL   RDW 20.9 (H) 11.5 - 15.5 %   Platelets 300 150 - 400 K/uL   nRBC 0.0 0.0 - 0.2 %    Comment: Performed at [facility], [place] [address] [place], [place] [address]  Glucose, capillary     Status: None   Collection Time: 09/07/20  6:07 AM  Result Value Ref Range   Glucose-Capillary 88 70 - 99 mg/dL    Comment: Glucose reference range applies only to samples taken after fasting for at least 8 hours.   Glucose, capillary     Status: None   Collection Time: 09/07/20 12:05 PM  Result Value Ref Range   Glucose-Capillary 96 70 - 99 mg/dL    Comment: Glucose reference range applies only to samples taken after fasting for at least 8 hours.  Glucose, capillary     Status: None   Collection Time: 09/08/20  6:09 AM  Result Value Ref Range   Glucose-Capillary 94 70 - 99 mg/dL    Comment: Glucose reference range applies only to samples taken after fasting for at least 8 hours.    Recent Results (from the past 240 hour(s))  Resp Panel by RT-PCR (Flu A&B, Covid) Nasopharyngeal Swab     Status: None   Collection Time: 09/02/20  1:06 AM   Specimen: Nasopharyngeal Swab; Nasopharyngeal(NP) swabs in vial transport medium  Result Value Ref Range Status   SARS Coronavirus 2 by RT PCR NEGATIVE NEGATIVE Final    Comment: (NOTE) SARS-CoV-2 target nucleic acids are NOT DETECTED.  The SARS-CoV-2 RNA is generally detectable in upper respiratory specimens during the acute phase of infection. The lowest concentration of SARS-CoV-2 viral copies this assay can detect is 138 copies/mL. A negative result does not preclude SARS-Cov-2 infection and should not be used as the sole basis for treatment or other patient management decisions. A negative result may occur with  improper specimen collection/handling, submission of specimen other than nasopharyngeal swab, presence of viral mutation(s) within the areas targeted by this assay, and inadequate number of viral copies(<138 copies/mL). A negative result must be combined with clinical observations, patient history, and epidemiological information. The expected result is Negative.  Fact Sheet for Patients:  EntrepreneurPulse.com.au  Fact Sheet for Healthcare Providers:  IncredibleEmployment.be  This test is no t yet approved or cleared by the [place] FDA and  has been authorized for detection and/or diagnosis of  SARS-CoV-2 by FDA under an Emergency Use Authorization (EUA). This EUA will remain  in effect (meaning this test can be used) for the duration of the COVID-19 declaration under Section 564(b)(1) of the Act, 21 U.S.C.section 360bbb-3(b)(1), unless the authorization is terminated  or revoked sooner.       Influenza A by PCR NEGATIVE NEGATIVE Final   Influenza B by PCR NEGATIVE NEGATIVE Final    Comment: (NOTE) The Xpert Xpress SARS-CoV-2/FLU/RSV plus assay is intended as an aid in the diagnosis of influenza from Nasopharyngeal swab specimens and  should not be used as a sole basis for treatment. Nasal washings and aspirates are unacceptable for Xpert Xpress SARS-CoV-2/FLU/RSV testing.  Fact Sheet for Patients: EntrepreneurPulse.com.au  Fact Sheet for Healthcare Providers: IncredibleEmployment.be  This test is not yet approved or cleared by the [place] FDA and has been authorized for detection and/or diagnosis of SARS-CoV-2 by FDA under an Emergency Use Authorization (EUA). This EUA will remain in effect (meaning this test can be used) for the duration of the COVID-19 declaration under Section 564(b)(1) of the Act, 21 U.S.C. section 360bbb-3(b)(1), unless the authorization is terminated or revoked.  Performed at [facility], [place] [address], [place], [place] [address]   CSF culture     Status: None   Collection Time: 09/02/20  6:55 PM   Specimen: CSF; Cerebrospinal Fluid  Result Value Ref Range Status   Specimen Description CSF  Final   Special Requests NONE  Final   Gram Stain NO WBC SEEN NO ORGANISMS SEEN CYTOSPIN SMEAR   Final   Culture   Final    NO GROWTH 3 DAYS Performed at [facility], [place] [address] [place], [place] [address]    Report Status 09/06/2020 FINAL  Final    Lipid Panel No results for input(s): CHOL, TRIG, HDL, CHOLHDL, VLDL, LDLCALC in the last 72 hours.  Studies/Results: No results  found.  Medications:  Scheduled: . amitriptyline  100 mg Oral QHS  . enoxaparin (LOVENOX) injection  40 mg Subcutaneous Q24H  . escitalopram  20 mg Oral Daily  . gabapentin  900 mg Oral TID  . magnesium oxide  400 mg Oral BID  . mometasone-formoterol  2 puff Inhalation BID  . naloxegol oxalate  25 mg Oral QHS  . oxyCODONE  10 mg Oral BID  . topiramate  200 mg Oral QHS  . traMADol  50 mg Oral Once   Continuous: . sodium chloride 100 mL/hr at 09/03/20 1330  . methylPREDNISolone (SOLU-MEDROL) injection Stopped (09/07/20 1116)  . promethazine (PHENERGAN) injection (IM or IVPB) Stopped (09/07/20 1144)  . thiamine injection 250 mg (09/07/20 2104)   Assessment:59 year oldfemalewith past medical history of migraine headaches, anxiety, depression, fibromyalgia, status post bariatric surgery, presenting for evaluation of altered mental status and formed auditory/visual hallucinations. She initially was committed involuntarily but then released for further medical evaluation prior to any psychiatric admission, noted to have abnormal MRIbrain. -MRI BrainW/WO showedill-defined areas of white matter hyperintensity bilaterallymore prominent in left frontal, occipital and parietal lobeswhich do not necessarily fit any typical pattern. No enhancing lesions thou. Differentials do include demyelination versus encephalitis and according to radiology also PML but it is very uncharacteristic for all of those. Not on any immunosuppressants except for steroids for asthma control. -Other differentials to consider are autoimmune encephalitides including anti-MOG encephalitis, NMDAR encephalitisandADEM, which can present with headaches and psychiatric features. -Serum anti-MOG antibodiesarepending  - CSF clear and colorless with normal total protein and 2 WBC. CSF with no growth to date. CSF OCBs came back positive with 3 bands not also seen in serum. CSF IgG index came back positive at 0.8.  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 Although multiple sclerosis would be most likely from an epidemiological standpoint, other conditions such as anti-MOG encephalitisandADEM are also on the DDx. Of note, all 3 of the above can present with neuropsychiatric symptoms.   Recommendations:  Hallucinations resolved after IVMP. Feels better, althou still has headaches which  are consistent with her history of Migraine. Given improved symptoms at this time, will hold off on further steroids or other Immunomodulatory treatments at this time. The results of Autoimmune encephalitis panel, MOG Ab and clinical symptoms can be used to guide further treatment strategy.   Tramadol 50mg  x1 for headache ordered.  Serum drug screen.  Anti-Myelin Assoc Glycop IgG  Order autoimmune encephalitis Ab panel for [facility] send-out and f/u as outpatient  Patient is really interested in following up with her outpatient neurologist Dr. Ferdinand Lango. F/u with Dr. Jaynee Eagles as outpatient in 2 weeks.  Repeat MRI Brain with and without contrast in 2 weeks to assess for any new white matter lesions.  Okay to discharge home. I discussed return precautions with patient.  Discussed with Dr. Louanne Belton over secure chat.    LOS: 6 days

## 2020-09-08 NOTE — Discharge Summary (Signed)
Physician Discharge Summary  Cindy Hoffman DDU:[id number] DOB: 1975/08/06 DOA: 09/02/2020  PCP: Berkley Harvey, NP  Admit date: 09/02/2020 Discharge date: 09/08/2020  Admitted From: Home  Discharge disposition: Home  Recommendations for Outpatient Follow-Up:   . Follow up with your primary care provider in one week.  . Check CBC, BMP, magnesium in the next visit . Follow-up with your neurologist Dr. Jaynee Eagles as outpatient to discuss about multiple sclerosis treatment.  Antimyelin glycoprotein and other labs have been sent from the hospital which need to be followed up. Marland Kitchen MRI of the thoracic/ cervical spine has been ordered prior to discharge - is to be followed up  Discharge Diagnosis:   Principal Problem:   MRI of brain abnormal Active Problems:   Hallucinations   Encephalopathy   Seizure disorder ([place]) multiple sclerosis   Discharge Condition: Improved.  Diet recommendation: Low sodium, heart healthy.    Wound care: None.  Code status: Full.   History of Present Illness:   Cindy Hoffman is a 45 y.o. female with medical history significant of  chronic pain syndrome, obesity, asthma presented to hospital with hallucination and seizures.  She also complained of having some falls and blurred vision.  She had stopped some of her medications and continued to have hallucinations..  Patient was then IVCD and was brought into the behavioral health unit.  Patient was thought to have  some medical issues, so she was referred to the emergency department.  MRI of the brain done in the ED showed bilateral white matter abnormalities with differentials including demyelinating disease with acute on chronic encephalomyelitis,PML.  Neurology was consulted and underwent lumbar puncture.  Patient was then admitted to hospital for further evaluation and treatment.   Hospital Course:   Following conditions were addressed during hospitalization as listed below,  Abnormal MRI of brain,  encephalopathy, hallucinations -likely multiple sclerosis. Hallucinations have improved.    Differential diagnosis included  demyelinating disease, acute on chronic enterocolitis, PML.  Neurology was consulted. Lumbar puncture without any infective pathology.  Lumbar puncture showed abnormal oligoclonal bands, IgG CSF index likely multiple sclerosis.  CSF culture was negative.  Patient is received high dose IV Solu-Medrol and thiamine starting 09/04/20 and completed 5-day course of Solu-Medrol..    Patient has been seen by neurology today and recommend outpatient follow-up with primary neurology.  MRI of the cervical thoracic spine has been requested prior to discharge.    Seizure disorder - EEG was performed.  EEG was negative for seizure-like activity.  No further seizure-like activity where reported during hospitalization.   Hypokalemia. Replenished orally.  Continue magnesium oxide.    Anxiety, fibromyalgia, migraine headache, depression.  Patient is on multiple medications as outpatient including amitriptyline, Lexapro, gabapentin, Atarax, Ativan and tizanidine including Topamax.   Patient gets her monthly shot of migraine medication as outpatient.     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  Received IV thiamine during hospitalization.   hypomagnesemia -improved after replacement.   Disposition.  At this time, patient is stable for disposition home with outpatient PCP and neurology follow-up.  Spoke with the patient's significant other at bedside.  Medical Consultants:    Neurology  Procedures:     EEG  Lumbar puncture  Subjective:   Today, patient was seen and examined at bedside.  Patient complains of headache.  Denies any nausea, dizziness, lightheadedness  Discharge Exam:   Vitals:   09/08/20 0852 09/08/20 1206  BP:  116/77  Pulse:  82  Resp:  20  Temp:  97.9 F (36.6 C)  SpO2: 100% 100%   Vitals:   09/08/20 0352 09/08/20 0819 09/08/20 0852 09/08/20 1206  BP:  118/62 133/87  116/77  Pulse: 74 82  82  Resp: 18 18  20   Temp: 97.6 F (36.4 C)   97.9 F (36.6 C)  TempSrc: Oral     SpO2: 100% 100% 100% 100%  Weight:      Height:       General: Alert awake, not in obvious distress HENT: pupils equally reacting to light,  No scleral pallor or icterus noted. Oral mucosa is moist.  Chest:  Clear breath sounds.  Diminished breath sounds bilaterally. No crackles or wheezes.  CVS: S1 &S2 heard. No murmur.  Regular rate and rhythm. Abdomen: Soft, nontender, nondistended.  Bowel sounds are heard.   Extremities: No cyanosis, clubbing or edema.  Peripheral pulses are palpable. Psych: Alert, awake and oriented, normal mood CNS:  No cranial nerve deficits.  Power equal in all extremities.   Skin: Warm and dry.  No rashes noted.  The results of significant diagnostics from this hospitalization (including imaging, microbiology, ancillary and laboratory) are listed below for reference.     Diagnostic Studies:   MR Brain W and Wo Contrast  Result Date: 09/02/2020 CLINICAL DATA:  Acute mental status change EXAM: MRI HEAD WITHOUT AND WITH CONTRAST TECHNIQUE: Multiplanar, multiecho pulse sequences of the brain and surrounding structures were obtained without and with intravenous contrast. CONTRAST:  37mL GADAVIST GADOBUTROL 1 MMOL/ML IV SOLN COMPARISON:  CT head 02/05/2020.  MRI head 03/31/2015 FINDINGS: Brain: Multiple patchy ill-defined areas of white matter hyperintensity bilaterally most prominent in the left frontal lobe and left occipital parietal lobe. This has progressed since 2016. Small focal areas of white matter hyperintensity bilaterally are mild and progressive from the prior study. Ventricle size normal. Cerebral volume normal. No areas of restricted diffusion. Negative for hemorrhage or mass. Normal brainstem. Normal enhancement. Vascular: Normal arterial flow voids. Skull and upper cervical spine: No focal skeletal abnormality. Sinuses/Orbits: Normal  orbit.  Paranasal sinuses clear. Other: None IMPRESSION: Ill-defined areas of white matter hyperintensity bilaterally left greater than right, with progression since 2016. Differential diagnosis includes demyelinating disease, acute or chronic encephalomyelitis, PML. Negative for acute infarct.  No enhancing lesion identified. Electronically Signed   By: Franchot Gallo M.D.   On: 09/02/2020 17:11   EEG adult  Result Date: 09/03/2020 Lora Havens, MD     09/03/2020  8:44 AM Patient Name: Cindy Hoffman MRN: [id number] Epilepsy Attending: Lora Havens Referring Physician/Provider: Dr Kerney Elbe Date: 09/02/2020 Duration: 22.08 mins Patient history: 45 year old woman presenting for evaluation of altered mental status and hallucinations.  EEG to evaluate for seizures. Level of alertness: Awake AEDs during EEG study: Topiramate, gabapentin Technical aspects: This EEG study was done with scalp electrodes positioned according to the 10-20 International system of electrode placement. Electrical activity was acquired at a sampling rate of 500Hz  and reviewed with a high frequency filter of 70Hz  and a low frequency filter of 1Hz . EEG data were recorded continuously and digitally stored. Description: The posterior dominant rhythm consists of 9 Hz activity of moderate voltage (25-35 uV) seen predominantly in posterior head regions, symmetric and reactive to eye opening and eye closing. Hyperventilation and photic stimulation were not performed.   IMPRESSION: This study is within normal limits. No seizures or epileptiform discharges were seen throughout the recording. [place]:  Basic Metabolic Panel: Recent Labs  Lab 09/04/20 0512 09/05/20 0334 09/06/20 0149 09/07/20 0257 09/08/20 0804  NA 142 140 144 140 138  K 3.1* 5.0 5.5* 3.2* 3.5  CL 111 111 117* 108 105  CO2 25 22 23 22 28   GLUCOSE 85 211* 125* 177* 89  BUN 8 7 11 11 13   CREATININE 0.65 0.71 0.64 0.65 0.58  CALCIUM 8.2* 8.9  8.5* 8.3* 8.6*  MG 2.1 2.1 2.3 2.4 2.3   GFR Estimated Creatinine Clearance: 92.8 mL/min (by C-G formula based on SCr of 0.58 mg/dL). Liver Function Tests: Recent Labs  Lab 09/02/20 0122 09/02/20 1743  AST 25  --   ALT 29  --   ALKPHOS 113  --   BILITOT 0.8  --   PROT 7.0  --   ALBUMIN 3.5 3.3*   No results for input(s): LIPASE, AMYLASE in the last 168 hours. No results for input(s): AMMONIA in the last 168 hours. Coagulation profile No results for input(s): INR, PROTIME in the last 168 hours.  CBC: Recent Labs  Lab 09/02/20 0122 09/03/20 0233 09/04/20 0512 09/05/20 0334 09/06/20 0149 09/07/20 0257 09/08/20 0804  WBC 8.0   < > 4.0 5.7 7.7 6.7 9.1  NEUTROABS 5.9  --   --   --   --   --   --   HGB 11.7*   < > 9.8* 11.4* 10.2* 10.1* 11.2*  HCT 34.9*   < > 32.2* 36.6 32.8* 32.3* 36.8  MCV 85.5   < > 91.5 92.7 91.6 91.2 92.7  PLT 227   < > 221 301 304 300 376   < > = values in this interval not displayed.   Cardiac Enzymes: No results for input(s): CKTOTAL, CKMB, CKMBINDEX, TROPONINI in the last 168 hours. BNP: Invalid input(s): POCBNP CBG: Recent Labs  Lab 09/06/20 1819 09/07/20 0607 09/07/20 1205 09/08/20 0609 09/08/20 1205  GLUCAP 149* 88 96 94 102*   D-Dimer No results for input(s): DDIMER in the last 72 hours. Hgb A1c No results for input(s): HGBA1C in the last 72 hours. Lipid Profile No results for input(s): CHOL, HDL, LDLCALC, TRIG, CHOLHDL, LDLDIRECT in the last 72 hours. Thyroid function studies No results for input(s): TSH, T4TOTAL, T3FREE, THYROIDAB in the last 72 hours.  Invalid input(s): FREET3 Anemia work up No results for input(s): VITAMINB12, FOLATE, FERRITIN, TIBC, IRON, RETICCTPCT in the last 72 hours. Microbiology Recent Results (from the past 240 hour(s))  Resp Panel by RT-PCR (Flu A&B, Covid) Nasopharyngeal Swab     Status: None   Collection Time: 09/02/20  1:06 AM   Specimen: Nasopharyngeal Swab; Nasopharyngeal(NP) swabs in vial  transport medium  Result Value Ref Range Status   SARS Coronavirus 2 by RT PCR NEGATIVE NEGATIVE Final    Comment: (NOTE) SARS-CoV-2 target nucleic acids are NOT DETECTED.  The SARS-CoV-2 RNA is generally detectable in upper respiratory specimens during the acute phase of infection. The lowest concentration of SARS-CoV-2 viral copies this assay can detect is 138 copies/mL. A negative result does not preclude SARS-Cov-2 infection and should not be used as the sole basis for treatment or other patient management decisions. A negative result may occur with  improper specimen collection/handling, submission of specimen other than nasopharyngeal swab, presence of viral mutation(s) within the areas targeted by this assay, and inadequate number of viral copies(<138 copies/mL). A negative result must be combined with clinical observations, patient history, and epidemiological information. The expected result is Negative.  Fact Sheet for  Patients:  EntrepreneurPulse.com.au  Fact Sheet for Healthcare Providers:  IncredibleEmployment.be  This test is no t yet approved or cleared by the [place] FDA and  has been authorized for detection and/or diagnosis of SARS-CoV-2 by FDA under an Emergency Use Authorization (EUA). This EUA will remain  in effect (meaning this test can be used) for the duration of the COVID-19 declaration under Section 564(b)(1) of the Act, 21 U.S.C.section 360bbb-3(b)(1), unless the authorization is terminated  or revoked sooner.       Influenza A by PCR NEGATIVE NEGATIVE Final   Influenza B by PCR NEGATIVE NEGATIVE Final    Comment: (NOTE) The Xpert Xpress SARS-CoV-2/FLU/RSV plus assay is intended as an aid in the diagnosis of influenza from Nasopharyngeal swab specimens and should not be used as a sole basis for treatment. Nasal washings and aspirates are unacceptable for Xpert Xpress SARS-CoV-2/FLU/RSV testing.  Fact  Sheet for Patients: EntrepreneurPulse.com.au  Fact Sheet for Healthcare Providers: IncredibleEmployment.be  This test is not yet approved or cleared by the [place] FDA and has been authorized for detection and/or diagnosis of SARS-CoV-2 by FDA under an Emergency Use Authorization (EUA). This EUA will remain in effect (meaning this test can be used) for the duration of the COVID-19 declaration under Section 564(b)(1) of the Act, 21 U.S.C. section 360bbb-3(b)(1), unless the authorization is terminated or revoked.  Performed at [facility], [place] [address], [place], [place] [address]   CSF culture     Status: None   Collection Time: 09/02/20  6:55 PM   Specimen: CSF; Cerebrospinal Fluid  Result Value Ref Range Status   Specimen Description CSF  Final   Special Requests NONE  Final   Gram Stain NO WBC SEEN NO ORGANISMS SEEN CYTOSPIN SMEAR   Final   Culture   Final    NO GROWTH 3 DAYS Performed at [facility], [place] [address] [place], [place] [address]    Report Status 09/06/2020 FINAL  Final     Discharge Instructions:   Discharge Instructions     Diet - low sodium heart healthy   Complete by: As directed    Discharge instructions   Complete by: As directed    Follow-up with your primary care physician in 1 week.  Follow-up at the pain management clinic.  Follow-up with Dr. Jaynee Eagles neurology within 1 week to discuss about your treatment.   Increase activity slowly   Complete by: As directed       Allergies as of 09/08/2020       Reactions   Beta Adrenergic Blockers Anaphylaxis   Nsaids    Other reaction(s): Other Contraindicated due to bariatric surgery   Tolmetin Other (See Comments)   Contraindicated due to bariatric surgery Contraindicated due to bariatric surgery        Medication List     TAKE these medications    albuterol 108 (90 Base) MCG/ACT inhaler Commonly known as: VENTOLIN HFA Inhale 2  puffs into the lungs every 6 (six) hours as needed for wheezing or shortness of breath.   albuterol (2.5 MG/3ML) 0.083% nebulizer solution Commonly known as: PROVENTIL Take 2.5 mg by nebulization every 6 (six) hours as needed for wheezing or shortness of breath.   amitriptyline 25 MG tablet Commonly known as: ELAVIL TAKE 4 TABLETS (100 MG TOTAL) BY MOUTH AT BEDTIME.   baclofen 10 MG tablet Commonly known as: LIORESAL TAKE 1 TABS BY MOUTH 3 TIMES DAILY AS NEEDED FOR MUSCLE SPASMS. PLEASE  SPECIFY DIRECTIONS. What changed: See the new instructions.   dicyclomine 20 MG tablet Commonly known as: BENTYL Take 20 mg by mouth 3 (three) times daily as needed (abdominal spasms.).   escitalopram 20 MG tablet Commonly known as: LEXAPRO Take 20 mg by mouth daily.   furosemide 20 MG tablet Commonly known as: LASIX Take 2 tablets (40 mg total) by mouth daily.   gabapentin 250 MG/5ML solution Commonly known as: NEURONTIN Take 900 mg by mouth 3 (three) times daily.   hydrOXYzine 25 MG tablet Commonly known as: ATARAX/VISTARIL Take 25 mg by mouth 2 (two) times daily as needed for anxiety.   LORazepam 0.5 MG tablet Commonly known as: ATIVAN Take 0.5 mg by mouth daily as needed for anxiety.   magnesium 30 MG tablet Take 1 tablet (30 mg total) by mouth 2 (two) times daily.   mometasone-formoterol 100-5 MCG/ACT Aero Commonly known as: DULERA Inhale 2 puffs into the lungs 2 (two) times daily.   Movantik 25 MG Tabs tablet Generic drug: naloxegol oxalate Take 25 mg by mouth at bedtime.   oxyCODONE ER 13.5 MG C12a Take 13.5 mg by mouth 2 (two) times daily.   Oxycodone HCl 10 MG Tabs Take 1 tablet (10 mg total) by mouth 4 (four) times daily.   potassium chloride SA 20 MEQ tablet Commonly known as: KLOR-CON Take 1 tablet (20 mEq total) by mouth daily for 7 days.   tiZANidine 4 MG tablet Commonly known as: ZANAFLEX TAKE 1-2 TABLETS (4-8 MG TOTAL) BY MOUTH EVERY 8 (EIGHT) HOURS AS  NEEDED FOR MUSCLE SPASMS.   topiramate 200 MG tablet Commonly known as: TOPAMAX TAKE 1 TABLET (200 MG TOTAL) BY MOUTH AT BEDTIME.   Vitamin D (Cholecalciferol) 10 MCG (400 UNIT) Caps Take 1 capsule by mouth daily.        Follow-up Information     Berkley Harvey, NP Follow up.   Specialty: Nurse Practitioner Contact information: [address] [place] [address] [phone]         Melvenia Beam, MD. Schedule an appointment as soon as possible for a visit in 1 week(s).   Specialty: Neurology Why: for discussion about treatment Contact information: [place] [address] [phone]                 Time coordinating discharge: 39 minutes  Signed:  Angelys Yetman  [facility] 09/08/2020, 12:51 PM

## 2020-09-08 NOTE — Telephone Encounter (Signed)
Called pt. Scheduled work in appt for 09/17/20 at 1:30pm, check in 1:00 with Dr. Felecia Shelling. Advised her to bring updated med list, insurance cards and to make sure to wear a mask to appt. She verbalized understanding.

## 2020-09-08 NOTE — Plan of Care (Signed)

## 2020-09-08 NOTE — Progress Notes (Signed)
Pt refusing bed alarm to be set, she was educated on its importance especially after taking her pain medications, but she wanted it off

## 2020-09-08 NOTE — Telephone Encounter (Signed)
-----   Message from Melvenia Beam, MD sent at 09/08/2020 12:43 PM EDT ----- Regarding: New MS patient Cindy Hoffman, can you schedule patient for a follow up with Dr. Felecia Shelling for new MS diagnosis please? She is currently admitted to [facility] [place] for MS diagnosis. I have discussed with Dr. Felecia Shelling already. thanks

## 2020-09-09 ENCOUNTER — Other Ambulatory Visit: Payer: Medicare Other

## 2020-09-09 LAB — MISC LABCORP TEST (SEND OUT): Labcorp test code: 505265

## 2020-09-12 LAB — ANTI-MYELIN ASSOC GLYCOP IGG: Anti-Myelin Assoc Glycop IgG: <1:10 {titer}

## 2020-09-16 LAB — VITAMIN B1: Vitamin B1 (Thiamine): 200 nmol/L (ref 66.5–200.0)

## 2020-09-17 ENCOUNTER — Ambulatory Visit (INDEPENDENT_AMBULATORY_CARE_PROVIDER_SITE_OTHER): Payer: Medicare Other | Admitting: Neurology

## 2020-09-17 ENCOUNTER — Encounter: Payer: Self-pay | Admitting: Neurology

## 2020-09-17 VITALS — BP 119/78 | HR 109 | Ht 64.0 in | Wt 185.0 lb

## 2020-09-17 DIAGNOSIS — G40909 Epilepsy, unspecified, not intractable, without status epilepticus: Secondary | ICD-10-CM

## 2020-09-17 DIAGNOSIS — R531 Weakness: Secondary | ICD-10-CM | POA: Diagnosis not present

## 2020-09-17 DIAGNOSIS — R937 Abnormal findings on diagnostic imaging of other parts of musculoskeletal system: Secondary | ICD-10-CM

## 2020-09-17 DIAGNOSIS — Z9884 Bariatric surgery status: Secondary | ICD-10-CM | POA: Diagnosis not present

## 2020-09-17 DIAGNOSIS — R9089 Other abnormal findings on diagnostic imaging of central nervous system: Secondary | ICD-10-CM | POA: Diagnosis not present

## 2020-09-17 MED ORDER — LEVETIRACETAM 750 MG PO TABS: 750.0000 mg | ORAL_TABLET | Freq: Two times a day (BID) | ORAL | 11 refills | Status: DC

## 2020-09-17 NOTE — Progress Notes (Signed)
[facility]  PATIENT: Cindy Hoffman DOB: 04-29-1976  REFERRING DOCTOR OR PCP:   Sarina Ill, MD (neurology); Eldridge Abrahams, NP (PCP) SOURCE: Patient, Notes from recent hospitalization and Dr. Lavell Anchors, imaging and laboratory reports, MRI images personally reviewed.  _________________________________   HISTORICAL  CHIEF COMPLAINT:  Chief Complaint  Patient presents with  . New Patient (Initial Visit)    RM 89 with father, Cindy Hoffman. Pt of Dr. Cathren Laine referred for new dx of MS. Neurologist in the hospital told her not to take the emgality. Ambulates with cane. Has had about 10 falls in the last year. Having intermittent blurry vision. Wears glasses. Also having dizziness. Lips are numb, started 09/16/20. Hands will become spastic. Right leg will go numb sometimes. Had hallucinations (visual) twice- talking to God, sister, angels. Episodes lasted 3 days and then second time about 5 days.     HISTORY OF PRESENT ILLNESS:  I had the pleasure of seeing patient, Cindy Hoffman, at the [place] at [facility] for neurologic consultation regarding her abnormal brain MRI and neurologic symptoms.  She is a 45 year old woman referred for second opinion regarding her abnormal brain MRI and possibility of multiple sclerosis.  She began to experience headaches in 1997 but not other symptoms.    In October 2016, she had numbness in the right scalp/face and had ringing in her right ear as well as headaches.   She was told she had occipital neuralgia.   Her hearing has only partially returned.   Numbness resolved after a few days but pain persisted.     She began to experience some numbness in the right leg since about 2016 and since 2021 has had numbness in her hands.  MRI of the brain in 03/31/2015 showed some T2/Flair hyperintense foci in the hemispheres.  A few foci were more discrete while others had a less defined border.  None of the foci enhanced or appeared acute on  diffusion-weighted imaging.  Her gait worsened around 2017 and she has had weakness and clumsiness in the right leg.   She has some right leg numbness as well.  Follow-up MRI 12/26/2015 showed no definite change compared to the 2016 MRI.  MRI of the cervical spine was normal at that time.  She continues to experience symptoms predominantly in the right leg over the next couple years.  She started having falls more recently.    She began to have episodes of her hands being locked up bilaterally lasting 10 minutes since early 2022.    She also has had more cognitive issues over the past year.  Specifically, there is more memory loss, fatigue, reduced processing, reduced focus/attention.   She has had hallucinations - auditory (ringing bells) more than visual.  She noted blurry vision.   She began to have conversations with her deceased sister and God and felt that they were very real at the time.  She was admitted to the hospital for hallucinations 09/02/2020.  She felt symptoms improved while in the hospital and she is not experiencing hallucinations currently.      During her hospital stay, she had additional MRI imaging.  MRI of the brain 09/02/2020 showed progression of 2 of the hazy foci noted on the 2016 MRI and mild regression of her third hazy focus.  She had several more discrete foci including a couple that were not present in 2016.  None of the foci enhance.  MRI of the cervical spine showed a T2 hyperintense focus  towards the right adjacent to C2 that had not been present in 2016.  It did not enhance.  She also had lumbar puncture.  CSF showed 3 oligoclonal bands and mildly elevated IgG index and elevated protein.  Currently, she is reporting continued clumsiness and mild weakness in the right leg and some sensory asymmetry.  She notes mild urinary urgency but no incontinence.  She had bariatric surgery January 2018 (gastric bypass).   She went from 380 pounds down to 175.   She takes MVI.  She takes  B12 shots.     She had several witnessed seizure.   She was on topiramate for the migraines at the time.   z  She has been taking Emgality for her migraine with benefit.     IMAGING: MRI brain 09/02/2020 showed increase in the white matter hyperintensities compared to the 2016 MRI.  Specifically, diffuse foci in the left frontal lobe and left temporal lobe were increased in size compared to 2016 while a periatrial diffuse focus in the right periatrial white matter actually was less apparent on the current MRI.  A few of the more discrete foci present had occurred since the 2016 MRI.  Normal enhancement pattern.Marland Kitchen    MRI cervical spine 09/08/2008 showed a lesion adjacent to C2 towards the right within the spinal cord.  It did not enhance.  MRI thoracic spine 09/08/2020 showed normal spinal cord.  Multilevel disc protrusions, unchanged compared to 2019  CSF 09/02/2020 showed 3 OCB, increased IgG Index and increased protein.  REVIEW OF SYSTEMS: Constitutional: No fevers, chills, sweats, or change in appetite Eyes: No visual changes, double vision, eye pain Ear, nose and throat: No hearing loss, ear pain, nasal congestion, sore throat Cardiovascular: No chest pain, palpitations Respiratory: No shortness of breath at rest or with exertion.   No wheezes GastrointestinaI: No nausea, vomiting, diarrhea, abdominal pain, fecal incontinence Genitourinary: No dysuria, urinary retention or frequency.  No nocturia. Musculoskeletal: No neck pain, back pain Integumentary: No rash, pruritus, skin lesions Neurological: as above Psychiatric: No depression at this time.  No anxiety Endocrine: No palpitations, diaphoresis, change in appetite, change in weigh or increased thirst Hematologic/Lymphatic: No anemia, purpura, petechiae. Allergic/Immunologic: No itchy/runny eyes, nasal congestion, recent allergic reactions, rashes  ALLERGIES: Allergies  Allergen Reactions  . Beta Adrenergic Blockers Anaphylaxis  .  Nsaids     Other reaction(s): Other Contraindicated due to bariatric surgery  . Tolmetin Other (See Comments)    Contraindicated due to bariatric surgery Contraindicated due to bariatric surgery    HOME MEDICATIONS:  Current Outpatient Medications:  .  albuterol (PROVENTIL HFA;VENTOLIN HFA) 108 (90 BASE) MCG/ACT inhaler, Inhale 2 puffs into the lungs every 6 (six) hours as needed for wheezing or shortness of breath. , Disp: , Rfl:  .  albuterol (PROVENTIL) (2.5 MG/3ML) 0.083% nebulizer solution, Take 2.5 mg by nebulization every 6 (six) hours as needed for wheezing or shortness of breath., Disp: , Rfl:  .  amitriptyline (ELAVIL) 25 MG tablet, TAKE 4 TABLETS (100 MG TOTAL) BY MOUTH AT BEDTIME., Disp: 360 tablet, Rfl: 4 .  baclofen (LIORESAL) 10 MG tablet, TAKE 1 TABS BY MOUTH 3 TIMES DAILY AS NEEDED FOR MUSCLE SPASMS. PLEASE SPECIFY DIRECTIONS. (Patient taking differently: Take by mouth 3 (three) times daily as needed for muscle spasms.), Disp: 30 tablet, Rfl: 6 .  dicyclomine (BENTYL) 20 MG tablet, Take 20 mg by mouth 3 (three) times daily as needed (abdominal spasms.)., Disp: , Rfl:  .  escitalopram (LEXAPRO)  20 MG tablet, Take 20 mg by mouth daily., Disp: , Rfl:  .  furosemide (LASIX) 20 MG tablet, Take 2 tablets (40 mg total) by mouth daily., Disp: 20 tablet, Rfl: 0 .  gabapentin (NEURONTIN) 250 MG/5ML solution, Take 900 mg by mouth 3 (three) times daily., Disp: , Rfl:  .  hydrOXYzine (ATARAX/VISTARIL) 25 MG tablet, Take 25 mg by mouth 2 (two) times daily as needed for anxiety., Disp: , Rfl:  .  magnesium 30 MG tablet, Take 1 tablet (30 mg total) by mouth 2 (two) times daily., Disp: 100 tablet, Rfl: 0 .  mometasone-formoterol (DULERA) 100-5 MCG/ACT AERO, Inhale 2 puffs into the lungs 2 (two) times daily., Disp: , Rfl:  .  MOVANTIK 25 MG TABS tablet, Take 25 mg by mouth at bedtime., Disp: , Rfl:  .  oxyCODONE ER 13.5 MG C12A, Take 13.5 mg by mouth 2 (two) times daily., Disp: , Rfl:  .   Oxycodone HCl 10 MG TABS, Take 1 tablet (10 mg total) by mouth 4 (four) times daily., Disp: 10 tablet, Rfl: 0 .  tiZANidine (ZANAFLEX) 4 MG tablet, TAKE 1-2 TABLETS (4-8 MG TOTAL) BY MOUTH EVERY 8 (EIGHT) HOURS AS NEEDED FOR MUSCLE SPASMS., Disp: 270 tablet, Rfl: 1 .  topiramate (TOPAMAX) 200 MG tablet, TAKE 1 TABLET (200 MG TOTAL) BY MOUTH AT BEDTIME., Disp: 90 tablet, Rfl: 1 .  Vitamin D, Cholecalciferol, 10 MCG (400 UNIT) CAPS, Take 1 capsule by mouth daily., Disp: 100 capsule, Rfl: 0 .  potassium chloride SA (KLOR-CON) 20 MEQ tablet, Take 1 tablet (20 mEq total) by mouth daily for 7 days., Disp: 7 tablet, Rfl: 0  PAST MEDICAL HISTORY: Past Medical History:  Diagnosis Date  . Anxiety   . Arthritis    knees  . Asthma   . Chronic pain syndrome   . Complication of anesthesia    "STATES WAKES UP AND CANT BREATHE AND NEEDS BREATHING TREATMENT  IN PACU"  . Constipation due to opioid therapy   . Dysrhythmia    palpitations- "from thyroid"  . Fibromyalgia   . Fracture of right foot   . Gastric ulcer 2018  . GERD (gastroesophageal reflux disease)   . History of kidney stones    "inbedded"  . Migraine    Mirgraine-  . Myofacial muscle pain   . Obesity   . Occipital neuralgia 2016  . OSA (obstructive sleep apnea)    Has CPAP  . Sleep apnea     NO CPAP--could not tolerates mask  . Thyroid disease   . Viral respiratory infection 06/26/13   ? influenza    PAST SURGICAL HISTORY: Past Surgical History:  Procedure Laterality Date  . COLONOSCOPY      x2  . ENDOMETRIAL ABLATION    . ENDOMETRIAL ABLATION  07/17/2017  . GALLBLADDER SURGERY  06/2016  . 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 HIP ARTHROSCOPY Right 05/10/2019   Procedure: Right hip arthroscopy with labrum repair and  femoroplasty;  Surgeon: Nicholes Stairs, MD;  Location: [place];  Service: Orthopedics;  Laterality: Right;  2.5 hrs  . NASAL SEPTUM SURGERY    . THYROID LOBECTOMY Right 07/19/2013   Procedure: THYROID LOBECTOMY;   Surgeon: Harl Bowie, MD;  Location: [facility];  Service: General;  Laterality: Right;  . TUBAL LIGATION    . WISDOM TOOTH EXTRACTION      FAMILY HISTORY: Family History  Problem Relation Age of Onset  . Hypertension Mother   . Hypertension Father   .  Prostate cancer Father   . Diabetes Sister   . Cancer Paternal Grandmother        breast    SOCIAL HISTORY:  Social History   Socioeconomic History  . Marital status: Legally Separated    Spouse name: Not on file  . Number of children: 1  . Years of education: GED  . Highest education level: Not on file  Occupational History  . Occupation: unemployed  Tobacco Use  . Smoking status: Current Every Day Smoker    Packs/day: 0.50    Years: 0.00    Pack years: 0.00    Types: Cigarettes    Last attempt to quit: 11/2015    Years since quitting: 4.8  . Smokeless tobacco: Never Used  . Tobacco comment: 1-2 cigs per day  Vaping Use  . Vaping Use: Never used  Substance and Sexual Activity  . Alcohol use: Yes    Alcohol/week: 2.0 standard drinks    Types: 2 Glasses of wine per week  . Drug use: No  . Sexual activity: Not on file  Other Topics Concern  . Not on file  Social History Narrative   Lives with father   Caffeine use: rare   Right handed   Social Determinants of Health   Financial Resource Strain: Not on file  Food Insecurity: Not on file  Transportation Needs: Not on file  Physical Activity: Not on file  Stress: Not on file  Social Connections: Not on file  Intimate Partner Violence: Not on file     PHYSICAL EXAM  Vitals:   09/17/20 1322  BP: 119/78  Pulse: (!) 109  Weight: 185 lb (83.9 kg)  Height: 5\' 4"  (1.626 m)    Body mass index is 31.76 kg/m.   General: The patient is well-developed and well-nourished and in no acute distress  HEENT:  Head is [place]/AT.  Sclera are anicteric.  Funduscopic exam shows normal optic discs and retinal vessels.  Neck: No carotid bruits are noted.  The neck  is nontender.  Cardiovascular: The heart has a regular rate and rhythm with a normal S1 and S2. There were no murmurs, gallops or rubs.    Skin: Extremities are without rash or  edema.  Musculoskeletal:  Back is nontender  Neurologic Exam  Mental status: The patient is alert and oriented x 3 at the time of the examination. The patient has mildly reduced short term memory and reduced attention span and concentration ability.   Speech is normal.  Cranial nerves: Extraocular movements are full. Pupils are equal, round, and reactive to light and accomodation.  Visual fields are full.  Color vision is fine.  Facial symmetry is present. There is good facial sensation to soft touch bilaterally.Facial strength is normal.  Trapezius and sternocleidomastoid strength is normal. No dysarthria is noted.  The tongue is midline, and the patient has symmetric elevation of the soft palate. No obvious hearing deficits are noted.  Motor:  Muscle bulk is normal.   Tone is slightly increased in the right foot.   Strength is  5 / 5 in arms but 4+/5 in right leg.    Sensory: Sensory testing is intact to pinprick, soft touch and vibration sensation in arms but reduced touch and vibration in right foot.  Coordination: Cerebellar testing reveals good finger-nose-finger and heel-to-shin bilaterally.  Gait and station: Station is normal.   Gait shows minimal right foot drop. Tandem gait is poor. Romberg is negative.   Reflexes: Deep tendon reflexes are  symmetric and normal bilaterally.   Plantar responses are flexor.     DIAGNOSTIC DATA (LABS, IMAGING, TESTING) - I reviewed patient records, labs, notes, testing and imaging myself where available.  Lab Results  Component Value Date   WBC 9.1 09/08/2020   HGB 11.2 (L) 09/08/2020   HCT 36.8 09/08/2020   MCV 92.7 09/08/2020   PLT 376 09/08/2020      Component Value Date/Time   NA 138 09/08/2020 0804   K 3.5 09/08/2020 0804   CL 105 09/08/2020 0804   CO2  28 09/08/2020 0804   GLUCOSE 89 09/08/2020 0804   BUN 13 09/08/2020 0804   CREATININE 0.58 09/08/2020 0804   CALCIUM 8.6 (L) 09/08/2020 0804   PROT 7.0 09/02/2020 0122   ALBUMIN 3.3 (L) 09/02/2020 1743   AST 25 09/02/2020 0122   ALT 29 09/02/2020 0122   ALKPHOS 113 09/02/2020 0122   BILITOT 0.8 09/02/2020 0122   GFRNONAA >60 09/08/2020 0804   GFRAA >60 02/04/2020 1659   Lab Results  Component Value Date   CHOL 161 09/02/2020   HDL 67 09/02/2020   LDLCALC 71 09/02/2020   TRIG 114 09/02/2020   CHOLHDL 2.4 09/02/2020   Lab Results  Component Value Date   HGBA1C 5.4 09/02/2020   No results found for: VITAMINB12 Lab Results  Component Value Date   TSH 2.182 09/02/2020       ASSESSMENT AND PLAN  MRI of brain abnormal - Plan: Neuromyelitis optica autoab, IgG, Anti-MOG, Serum, Copper, serum, ANCA TITERS, Vitamin E, Very Long Chain Fatty Acid, Arylsulfatase A Deficiency, Ambulatory referral to Neurology, CANCELED: Ambulatory referral to Neurology  Right sided weakness - Plan: Neuromyelitis optica autoab, IgG, Anti-MOG, Serum, ANCA TITERS, Very Long Chain Fatty Acid, Arylsulfatase A Deficiency, Ambulatory referral to Neurology, CANCELED: Ambulatory referral to Neurology  Seizure disorder [facility]) - Plan: Very Long Chain Fatty Acid, Arylsulfatase A Deficiency  History of gastric bypass - Plan: Copper, serum, Vitamin E  Abnormal MRI, cervical spine - Plan: Neuromyelitis optica autoab, IgG, Anti-MOG, Serum, Copper, serum, ANCA TITERS, Ambulatory referral to Neurology, CANCELED: Ambulatory referral to Neurology   In summary, [name] is a 45 year old woman with progressive right-sided numbness and weakness and reduced gait who has progressive changes on brain MRI.  Multiple sclerosis is certainly a possibility as there are some additional foci on brain MRI, she has a focus adjacent to C2 in the cervical spine and she had oligoclonal bands.  However, MS would not be expected to have  the more diffuse lesions present in the left frontal lobe, left temporal lobe and right parietal lobe.    The etiology of these foci is uncertain.  Adding more mystery, 2 of the 3 diffuse foci are larger while a third is smaller.  I discussed that I would be reluctant to begin a disease modifying therapy until I felt more certain of her diagnosis.  We will check blood work for neuromyelitis optica, vasculitis and metabolic genetic causes of adult leukodystrophy.  As her MRI so unusual, I would also like to get an additional opinion from another neuro-immunologist and we will set up a consult at [facility].  She continues to have some headaches.  Additionally she had a witnessed generalized tonic-clonic seizure earlier this year despite being on Topamax.  I will add Keppra 750 mg twice daily.  She will return to see [place] in about 3 to 4 months or sooner if there are new or worsening neurologic symptoms.   Sanita Estrada A.  Felecia Shelling, MD, [facility] 1/60/1093, 2:35 PM Certified in Neurology, Clinical Neurophysiology, Sleep Medicine and Neuroimaging  [facility] [address], [place], [place] [address] [phone]

## 2020-09-19 LAB — OPIATES,MS,WB/SP RFX
6-Acetylmorphine: NEGATIVE
Codeine: NEGATIVE ng/mL
Dihydrocodeine: NEGATIVE ng/mL
Hydrocodone: NEGATIVE ng/mL
Hydromorphone: NEGATIVE ng/mL
Morphine: NEGATIVE ng/mL
Opiate Confirmation: NEGATIVE

## 2020-09-19 LAB — DRUG SCREEN 10 W/CONF, SERUM
Amphetamines, IA: NEGATIVE ng/mL
Barbiturates, IA: NEGATIVE ug/mL
Benzodiazepines, IA: NEGATIVE ng/mL
Cocaine & Metabolite, IA: NEGATIVE ng/mL
Methadone, IA: NEGATIVE ng/mL
Opiates, IA: NEGATIVE ng/mL
Oxycodones, IA: POSITIVE ng/mL — AB
Phencyclidine, IA: NEGATIVE ng/mL
Propoxyphene, IA: NEGATIVE ng/mL
THC(Marijuana) Metabolite, IA: NEGATIVE ng/mL

## 2020-09-19 LAB — OXYCODONES,MS,WB/SP RFX
Oxycocone: 15.4 ng/mL
Oxycodones Confirmation: POSITIVE
Oxymorphone: NEGATIVE ng/mL

## 2020-09-26 LAB — VERY LONG CHAIN FATTY ACID
C 26:1: 0.29
C22:0: 16.19
C22:0w9: 1.29
Hexacosanoic Acid,C26: 0.26
Phytanic Acid: 0.12 ug/mL
Pristanic Acid: 0.04 ug/mL
Ratio C24/C22: 1.122
Ratio C26/C22: 0.016
Tetracosanoic Acid, C24: 18.17

## 2020-09-26 LAB — ARYLSULFATASE A DEFICIENCY: Arylsulfatase A activity: 60.8 nmol/hr/mg prt (ref 25.0–90.0)

## 2020-09-26 LAB — COPPER, SERUM: Copper: 89 ug/dL (ref 80–158)

## 2020-09-26 LAB — ANTI-MOG, SERUM: MOG Antibody, Cell-based IFA: NEGATIVE

## 2020-09-26 LAB — VITAMIN E
Vitamin E (Alpha Tocopherol): 11.1 mg/L (ref 7.0–25.1)
Vitamin E(Gamma Tocopherol): 1.2 mg/L (ref 0.5–5.5)

## 2020-09-26 LAB — NEUROMYELITIS OPTICA AUTOAB, IGG: NMO IgG Autoantibodies: <1.5 U/mL (ref 0.0–3.0)

## 2020-10-28 ENCOUNTER — Ambulatory Visit (INDEPENDENT_AMBULATORY_CARE_PROVIDER_SITE_OTHER): Payer: Medicare Other | Admitting: Neurology

## 2020-10-28 DIAGNOSIS — G43711 Chronic migraine without aura, intractable, with status migrainosus: Secondary | ICD-10-CM | POA: Diagnosis not present

## 2020-10-28 NOTE — Progress Notes (Signed)
10/28/2020: Stable, is going to [facility] for extensive testing for white matter lesions possibly MS  07/22/2020: stable, still doing exceptionally well  04/22/2020: 50% improvement since last botox. +5U masseters, +5 units orb oculi, +10 units emergence of occipital nerve bilaterally and the remainder right trap is the worst.   12/05/2019 She returns for Botox today. She was last seen in 02/2019. She reports that she has not felt well over the past few months and has had to reschedule Botox. She is having daily headaches. At least 14-15 are migrainous every month. She is on multiple chronic pain medications that help some. She uses Tylenol for abortive therapy. Last office visit 2019.    Consent Form Botulism Toxin Injection For Chronic Migraine    Reviewed orally with patient, additionally signature is on file:  Botulism toxin has been approved by the Federal drug administration for treatment of chronic migraine. Botulism toxin does not cure chronic migraine and it may not be effective in some patients.  The administration of botulism toxin is accomplished by injecting a small amount of toxin into the muscles of the neck and head. Dosage must be titrated for each individual. Any benefits resulting from botulism toxin tend to wear off after 3 months with a repeat injection required if benefit is to be maintained. Injections are usually done every 3-4 months with maximum effect peak achieved by about 2 or 3 weeks. Botulism toxin is expensive and you should be sure of what costs you will incur resulting from the injection.  The side effects of botulism toxin use for chronic migraine may include:   -Transient, and usually mild, facial weakness with facial injections  -Transient, and usually mild, head or neck weakness with head/neck injections  -Reduction or loss of forehead facial animation due to forehead muscle weakness  -Eyelid drooping  -Dry eye  -Pain at the site of injection or bruising at  the site of injection  -Double vision  -Potential unknown long term risks   Contraindications: You should not have Botox if you are pregnant, nursing, allergic to albumin, have an infection, skin condition, or muscle weakness at the site of the injection, or have myasthenia gravis, Lambert-Eaton syndrome, or ALS.  It is also possible that as with any injection, there may be an allergic reaction or no effect from the medication. Reduced effectiveness after repeated injections is sometimes seen and rarely infection at the injection site may occur. All care will be taken to prevent these side effects. If therapy is given over a long time, atrophy and wasting in the muscle injected may occur. Occasionally the patient's become refractory to treatment because they develop antibodies to the toxin. In this event, therapy needs to be modified.  I have read the above information and consent to the administration of botulism toxin.    BOTOX PROCEDURE NOTE FOR MIGRAINE HEADACHE  Contraindications and precautions discussed with patient(above). Aseptic procedure was observed and patient tolerated procedure. Procedure performed by Debbora Presto, FNP-C.   The condition has existed for more than 6 months, and pt does not have a diagnosis of ALS, Myasthenia Gravis or Lambert-Eaton Syndrome.  Risks and benefits of injections discussed and pt agrees to proceed with the procedure.  Written consent obtained  These injections are medically necessary. Pt  receives good benefits from these injections. These injections do not cause sedations or hallucinations which the oral therapies may cause.   Description of procedure:  The patient was placed in a sitting position. The standard  protocol was used for Botox as follows, with 5 units of Botox injected at each site:  -Procerus muscle, midline injection  -Corrugator muscle, bilateral injection  -Frontalis muscle, bilateral injection, with 2 sites each side, medial  injection was performed in the upper one third of the frontalis muscle, in the region vertical from the medial inferior edge of the superior orbital rim. The lateral injection was again in the upper one third of the forehead vertically above the lateral limbus of the cornea, 1.5 cm lateral to the medial injection site.  -Temporalis muscle injection, 4 sites, bilaterally. The first injection was 3 cm above the tragus of the ear, second injection site was 1.5 cm to 3 cm up from the first injection site in line with the tragus of the ear. The third injection site was 1.5-3 cm forward between the first 2 injection sites. The fourth injection site was 1.5 cm posterior to the second injection site. 5th site laterally in the temporalis  muscleat the level of the outer canthus.  -Occipitalis muscle injection, 3 sites, bilaterally. The first injection was done one half way between the occipital protuberance and the tip of the mastoid process behind the ear. The second injection site was done lateral and superior to the first, 1 fingerbreadth from the first injection. The third injection site was 1 fingerbreadth superiorly and medially from the first injection site.  -Cervical paraspinal muscle injection, 2 sites, bilaterally. The first injection site was 1 cm from the midline of the cervical spine, 3 cm inferior to the lower border of the occipital protuberance. The second injection site was 1.5 cm superiorly and laterally to the first injection site.  -Trapezius muscle injection was performed at 3 sites, bilaterally. The first injection site was in the upper trapezius muscle halfway between the inflection point of the neck, and the acromion. The second injection site was one half way between the acromion and the first injection site. The third injection was done between the first injection site and the inflection point of the neck.   Will return for repeat injection in 3 months.   A total of 200 units of Botox  was prepared, 200 units of Botox was injected as documented above, any Botox not injected was wasted. The patient tolerated the procedure well, there were no complications of the above procedure.  Made any corrections needed, and agree with procedure note  Sarina Ill, MD [facility]

## 2020-10-28 NOTE — Progress Notes (Signed)
Botox- 200 units x 1 vial Lot: [id number] Expiration: 05/2023 NDC: 6153-7943-27  Dx: M14.709 B/B

## 2020-11-05 ENCOUNTER — Ambulatory Visit (HOSPITAL_COMMUNITY)
Admission: EM | Admit: 2020-11-05 | Discharge: 2020-11-05 | Disposition: A | Discharge status: Home / Self Care | Payer: Medicare Other | Attending: Psychiatry | Admitting: Psychiatry

## 2020-11-05 ENCOUNTER — Other Ambulatory Visit: Payer: Self-pay

## 2020-11-05 DIAGNOSIS — F1010 Alcohol abuse, uncomplicated: Secondary | ICD-10-CM | POA: Insufficient documentation

## 2020-11-05 DIAGNOSIS — Z9151 Personal history of suicidal behavior: Secondary | ICD-10-CM | POA: Insufficient documentation

## 2020-11-05 DIAGNOSIS — R443 Hallucinations, unspecified: Secondary | ICD-10-CM

## 2020-11-05 DIAGNOSIS — F333 Major depressive disorder, recurrent, severe with psychotic symptoms: Secondary | ICD-10-CM | POA: Diagnosis not present

## 2020-11-05 DIAGNOSIS — R441 Visual hallucinations: Secondary | ICD-10-CM | POA: Diagnosis present

## 2020-11-05 DIAGNOSIS — R440 Auditory hallucinations: Secondary | ICD-10-CM | POA: Diagnosis present

## 2020-11-05 DIAGNOSIS — M545 Low back pain, unspecified: Secondary | ICD-10-CM | POA: Insufficient documentation

## 2020-11-05 NOTE — Discharge Instructions (Addendum)
Patient is instructed prior to discharge to:  Take all medications as prescribed by his/her mental healthcare provider. Report any adverse effects and or reactions from the medicines to his/her outpatient provider promptly. Keep all scheduled appointments, to ensure that you are getting refills on time and to avoid any interruption in your medication.  If you are unable to keep an appointment call to reschedule.  Be sure to follow-up with resources and follow-up appointments provided.  Patient has been instructed & cautioned: To not engage in alcohol and or illegal drug use while on prescription medicines. In the event of worsening symptoms, patient is instructed to call the crisis hotline, 911 and or go to the nearest ED for appropriate evaluation and treatment of symptoms. To follow-up with his/her primary care provider for your other medical issues, concerns and or health care needs.    Substance Abuse Treatment Resources listed Below:  [facility] - Admissions are currently completed Monday through Friday at 8am; both appointments and walk-ins are accepted.  Any individual that is a [place] resident may present for a substance abuse screening and assessment for admission.  A person may be referred by numerous sources or self-refer.   Potential clients will be screened for medical necessity and appropriateness for the program.  Clients must meet criteria for high-intensity residential treatment services.  If clinically appropriate, a client will continue with the comprehensive clinical assessment and intake process, as well as enrollment in the [facility].  Address: [address] [place], [place] [address] Admin Hours: Mon-Fri 8AM to 5PM Center Hours: 24/7 Phone: [phone] Fax: [phone]  [facility] Address: [address], [place], [place] [address] Behavioral Health Urgent Care ([facility]) Hours: 24/7 Phone:  [phone] Fax: [phone]  [facility]): (offers outpatient therapy and intensive outpatient substance abuse therapy).  [address], [place], [place] [address] Phone: [phone]  [facility]: Offers FREE recovery skills classes, support groups, 1:1 Peer Support, and Compeer Classes. [address], [place], [place] [address] Phone: [phone] (Call to complete intake).   [facility] [address] [place], [place] [address] Phone: [phone] ext [phone] [facility] provides food, shelter and other programs and services to the homeless men of [place] through our men's program.  By offering safe shelter, three meals a day, clean clothing, Biblical counseling, financial planning, vocational training, GED/education and employment assistance, we've helped mend the shattered lives of many homeless men since opening in 1974.  We have approximately 267 beds available, with a max of 312 beds including mats for emergency situations and currently house an average of 270 men a night.  Prospective Client Check-In Information Photo ID Required (State/ Out of State/ DOC) - if photo ID is not available, clients are required to have a printout of a police/sheriff's criminal history report. Help out with chores around the Mission. No sex offender of any type (pending, charged, registered and/or any other sex related offenses) will be permitted to check in. Must be willing to abide by all rules, regulations, and policies established by the  [facility]. The following will be provided - shelter, food, clothing, and biblical counseling. If you or someone you know is in need of assistance at our men's shelter in , [place], please call [phone] ext. [phone].  [facility]-will provide timely access to mental health services for children and adolescents (4-17) and adults    presenting in a mental health crisis. The program is designed for those who need urgent Behavioral Health or Substance Use treatment and are not experiencing a medical crisis that would typically require an emergency room visit.    [address] [place], [place] [address] Phone: [phone] [email or web address]  [facility]: Phone#: [phone]  The [facility] SA Intensive Outpatient Program (SAIOP) means structured individual and group addiction activities and services that are provided at an outpatient program designed to assist adult and adolescent consumers to begin recovery and learn skills for recovery maintenance. The [facility] SAIOP program is offered at least 3 hours a day, 3 days a week.SAIOP services shall include a structured program consisting of, but not limited to, the following services: Individual counseling and support; Group counseling and support; Family counseling, training or support; Biochemical assays to identify recent drug use (e.g., urine drug screens); Strategies for relapse prevention to include community and social support systems in treatment; Life skills; Crisis contingency planning; Disease Management; and Treatment support activities that have been adapted or specifically designed for persons with physical disabilities, or persons with co-occurring disorders of mental illness and substance abuse/dependence or mental retardation/developmental disability and substance abuse/dependence. Phone: [phone]  Address:   [facility] [place] will also offer the following outpatient services: (Monday through Friday 8am-5pm)   Partial Hospitalization Program (PHP) Substance Abuse Intensive Outpatient Program (SA-IOP) Group Therapy Medication Management Peer Living Room We also provide (24/7):  Assessments: Our mental health clinician and providers will conduct a focused mental health evaluation, assessing for immediate  safety concerns and further mental health needs. Referral: Our team will provide resources and help connect to community based mental health treatment, when indicated, including psychotherapy, psychiatry, and other specialized behavioral health or substance use disorder services (for those not already in treatment). Transitional Care: Our team providers in person bridging and/or telephonic follow-up during the patient's transition to outpatient services.  [facility] 24-Hour Call Center: [phone] Behavioral Health Crisis Line: [phone]  
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
## 2020-11-05 NOTE — BH Assessment (Addendum)
Comprehensive Clinical Assessment (CCA) Note  11/05/2020 Cindy Hoffman [id number]  Patient is a 45 year old female presenting voluntarily to [facility] for assessment of hallucinations, which began 3 days ago. Patient reports in May she hit her head and after that suffered personality changes as well as began to experience AH. She states this most recent episode of psychosis began 3 days ago. She denies at this time but reports hearing "demons" and "angels" earlier this date. She also reports that she has been abusing alcohol, drinking about 1 fifth of liquor per day. She reports last drinking 3 days ago. She denies any withdrawal symptoms at this time. Patient also tells me she discontinued her oxycodone that she is prescribed for chronic pain about 1 week ago due to side effects. She denies SI/HI. During evaluation she states that she needs a bed because her dad "needs a break from me and I need a break from him." This counselor discussed a referral to substance use treatment, outpatient therapy, and outpatient psychiatry but she declined. Patient gives verbal consent for TTS to speak with her brother at [phone] and/or her father at [phone], for collateral information. This counselor attempted to reach both but no answer. No option to leave voice mail.  Per Beatriz Stallion, FNP patient does not meet in patient care criteria. Patient to follow up with current provider for medication management.  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 Self  What Is the Reason for Your Visit/Call Today? Pt endorsed auditory and visual hallucination for three days; has experienced episodes of hallucination  before.  Transported by a family member  How Long Has This Been Causing You Problems? <Week  What Do You Feel Would Help You the Most Today? Alcohol or Drug Use Treatment; Treatment for Depression or other mood problem   Have You Recently Had Any Thoughts About Hurting Yourself? No  Are You Planning to Commit Suicide/Harm Yourself At This time? No   Have you Recently Had Thoughts About [place]? No  Are You Planning to Harm Someone at This Time? No  Explanation: No data recorded  Have You Used Any Alcohol or Drugs in the Past 24 Hours? No (Pt endorsed ongoing alcohol use (vodka); last drink four days ago)  How Long Ago Did You Use Drugs or Alcohol? No data recorded What Did You Use and How Much? No data recorded  Do You Currently Have a Therapist/Psychiatrist? No  Name of Therapist/Psychiatrist: No data recorded  Have You Been Recently Discharged From Any Office Practice or Programs? No  Explanation of Discharge From Practice/Program: No data recorded    CCA Screening Triage Referral Assessment Type of Contact: Face-to-Face  Telemedicine Service Delivery:   Is this Initial or Reassessment? No data recorded Date Telepsych consult ordered in [facility]:  No data recorded Time Telepsych consult ordered in [facility]:  No data recorded Location of Assessment: [facility] Assessment Services  Provider Location: [facility] Assessment Services   Collateral Involvement: attempted to reach father and brother   Does Patient Have a Stage manager Guardian? No data recorded Name and Contact of Legal Guardian: No data recorded If Minor and Not Living with  Parent(s), Who has Custody? No data recorded Is CPS involved or ever been involved? Never  Is APS involved or ever been involved? Never   Patient Determined To Be At Risk for Harm To Self or Others Based on Review of Patient Reported Information or Presenting Complaint? No  Method: No data recorded Availability of Means: No  data recorded Intent: No data recorded Notification Required: No data recorded Additional Information for Danger to Others Potential: No data recorded Additional Comments for Danger to Others Potential: No data recorded Are There Guns or Other Weapons in Your Home? No data recorded Types of Guns/Weapons: No data recorded Are These Weapons Safely Secured?                            No data recorded Who Could Verify You Are Able To Have These Secured: No data recorded Do You Have any Outstanding Charges, Pending Court Dates, Parole/Probation? No data recorded Contacted To Inform of Risk of Harm To Self or Others: Other: Comment    Does Patient Present under Involuntary Commitment? No  IVC Papers Initial File Date: 09/01/20   [place] of Residence: [place]   Patient Currently Receiving the Following Services: Medication Management   Determination of Need: Urgent (48 hours)   Options For Referral: [facility] Urgent [facility]; Medication Management; Outpatient Therapy     CCA Biopsychosocial Patient Reported Schizophrenia/Schizoaffective Diagnosis in Past: No   Strengths: insightful   Mental Health Symptoms Depression:   Irritability; Sleep (too much or little)   Duration of Depressive symptoms:  Duration of Depressive Symptoms: Greater than two weeks   Mania:   None   Anxiety:    Irritability; Tension; Worrying   Psychosis:   Hallucinations; Delusions (Pt denies any A/V hallucinations.)   Duration of Psychotic symptoms:  Duration of Psychotic Symptoms: Less than six months   Trauma:   None   Obsessions:   None   Compulsions:   None   Inattention:   None   Hyperactivity/Impulsivity:   N/A   Oppositional/Defiant Behaviors:   None   Emotional Irregularity:   None   Other Mood/Personality Symptoms:  No data recorded   Mental Status Exam Appearance and self-care  Stature:   Average   Weight:   Average weight   Clothing:   Casual   Grooming:    Normal   Cosmetic use:   None   Posture/gait:   Slumped   Motor activity:   Not Remarkable   Sensorium  Attention:   Normal   Concentration:   Normal   Orientation:   X5   Recall/memory:   Defective in Short-term   Affect and Mood  Affect:   Congruent   Mood:   Irritable   Relating  Eye contact:   Normal   Facial expression:   Constricted   Attitude toward examiner:   Guarded   Thought and Language  Speech flow:  Garbled; Soft   Thought content:   Appropriate to Mood and Circumstances   Preoccupation:   None   Hallucinations:   Auditory; Visual (Pt denying but she is responding to internal stimuli.)   Organization:  No data recorded  [employer] of Knowledge:   Average   Intelligence:   Average   Abstraction:   Normal   Judgement:   Impaired   Reality Testing:   Distorted   Insight:   Lacking   Decision Making:   Normal   Social  Functioning  Social Maturity:   Isolates   Social Judgement:   Normal   Stress  Stressors:   Illness; Family conflict (Migraines)   Coping Ability:   Overwhelmed   Skill Deficits:   Communication; Interpersonal   Supports:   Family     Religion: Religion/Spirituality Are You A Religious Person?: No  Leisure/Recreation: Leisure / Recreation Do You Have Hobbies?: No  Exercise/Diet: Exercise/Diet Do You Exercise?: No Have You Gained or Lost A Significant Amount of Weight in the Past Six Months?: No Do You Follow a Special Diet?: No Do You Have Any Trouble Sleeping?: No   CCA Employment/Education Employment/Work Situation: Employment / Work Technical sales engineer: On disability Has Patient ever Been in Passenger transport manager?: No  Education: Education Is Patient Currently Attending School?: No Last Grade Completed:  (UTA) Did You Attend College?: No Did You Have An Individualized Education Program (IIEP): No Did You Have Any Difficulty At School?:  No Patient's Education Has Been Impacted by Current Illness: No   CCA Family/Childhood History Family and Relationship History: Family history Does patient have children?: Yes How many children?: 1 How is patient's relationship with their children?: good  Childhood History:  Childhood History By whom was/is the patient raised?: Father Did patient suffer any verbal/emotional/physical/sexual abuse as a child?: No Did patient suffer from severe childhood neglect?: No Has patient ever been sexually abused/assaulted/raped as an adolescent or adult?: No Was the patient ever a victim of a crime or a disaster?: No Witnessed domestic violence?: No Has patient been affected by domestic violence as an adult?: No  Child/Adolescent Assessment:     CCA Substance Use Alcohol/Drug Use: Alcohol / Drug Use Pain Medications: None Prescriptions: Lexapro tazaniane?  Has been off for 2 weeks. Over the Counter: None History of alcohol / drug use?: Yes Substance #1 Name of Substance 1: alcohol 1 - Age of First Use: UTA 1 - Amount (size/oz): 1 5th 1 - Frequency: daily 1 - Duration: UTA 1 - Last Use / Amount: 3 days ago, 1 fifth 1 - Method of Aquiring: purchased 1- Route of Use: drank                       ASAM's:  Six Dimensions of Multidimensional Assessment  Dimension 1:  Acute Intoxication and/or Withdrawal Potential:   Dimension 1:  Description of individual's past and current experiences of substance use and withdrawal: patient states she has never experienced withdrawal before  Dimension 2:  Biomedical Conditions and Complications:   Dimension 2:  Description of patient's biomedical conditions and  complications: multiple physical ailments including MS  Dimension 3:  Emotional, Behavioral, or Cognitive Conditions and Complications:  Dimension 3:  Description of emotional, behavioral, or cognitive conditions and complications: depression  Dimension 4:  Readiness to Change:   Dimension 4:  Description of Readiness to Change criteria: contemplative  Dimension 5:  Relapse, Continued use, or Continued Problem Potential:  Dimension 5:  Relapse, continued use, or continued problem potential critiera description: seems to have little insight into her use  Dimension 6:  Recovery/Living Environment:  Dimension 6:  Recovery/Iiving environment criteria description: lives with father whom her relationship is poor with at this time  ASAM Severity Score: ASAM's Severity Rating Score: 11  ASAM Recommended Level of Treatment: ASAM Recommended Level of Treatment: Level III Residential Treatment   Substance use Disorder (SUD) Substance Use Disorder (SUD)  Checklist Symptoms of Substance Use: Continued use despite having a persistent/recurrent physical/psychological  problem caused/exacerbated by use, Continued use despite persistent or recurrent social, interpersonal problems, caused or exacerbated by use, Evidence of tolerance, Evidence of withdrawal (Comment), Presence of craving or strong urge to use, Substance(s) often taken in larger amounts or over longer times than was intended  Recommendations for Services/Supports/Treatments: Recommendations for Services/Supports/Treatments Recommendations For Services/Supports/Treatments: CD-IOP Intensive Chemical Dependency Program, IOP (Intensive Outpatient Program), Medication Management, SAIOP (Substance Abuse Intensive Outpatient Program)  Discharge Disposition:    DSM5 Diagnoses: Patient Active Problem List   Diagnosis Date Noted   MRI of brain abnormal 09/02/2020   Hallucinations 09/02/2020   Encephalopathy 09/02/2020   Seizure disorder ([place]) 09/02/2020   Labral tear of hip joint 05/10/2019   Chronic low back pain 08/30/2017   Chronic pain of both knees 08/30/2017   Chronic migraine without aura, with intractable migraine, so stated, with status migrainosus 03/06/2017   Intractable chronic migraine without aura and without  status migrainosus 09/29/2016   Occipital neuralgia 12/12/2015   Thyroid nodule 07/19/2013   Right thyroid nodule 06/18/2013   Morbid obesity ([place]) 05/01/2013   Asthma, chronic 02/01/2013   Smoker 02/01/2013     Referrals to Alternative Service(s): Referred to Alternative Service(s):   Place:   Date:   Time:    Referred to Alternative Service(s):   Place:   Date:   Time:    Referred to Alternative Service(s):   Place:   Date:   Time:    Referred to Alternative Service(s):   Place:   Date:   Time:     Orvis Brill, LCSW

## 2020-11-05 NOTE — Discharge Summary (Signed)
Cindy Hoffman to be D/C'd Home per NP order. Discussed with the patient and all questions fully answered. An After Visit Summary was printed and given to the patient.  Patient escorted out and D/C home via private auto.  [name]  11/05/2020 10:35 AM

## 2020-12-11 ENCOUNTER — Other Ambulatory Visit: Payer: Self-pay | Admitting: Adult Health

## 2020-12-11 ENCOUNTER — Other Ambulatory Visit: Payer: Self-pay | Admitting: Neurology

## 2020-12-15 NOTE — Telephone Encounter (Signed)
I called pt and I could not see she was on this, since ED visit, when they said to stop.  Had seen Dr. Felecia Shelling for possible MS, then had BOTO in 10/2020.  Pt states she is still on this.  Ok to refill?

## 2021-01-06 ENCOUNTER — Emergency Department (HOSPITAL_COMMUNITY): Payer: Medicare HMO

## 2021-01-06 ENCOUNTER — Emergency Department (HOSPITAL_COMMUNITY)
Admission: EM | Admit: 2021-01-06 | Discharge: 2021-01-07 | Disposition: A | Discharge status: Home / Self Care | Payer: Medicare HMO | Attending: Emergency Medicine | Admitting: Emergency Medicine

## 2021-01-06 ENCOUNTER — Encounter (HOSPITAL_COMMUNITY): Payer: Self-pay | Admitting: Emergency Medicine

## 2021-01-06 DIAGNOSIS — N9489 Other specified conditions associated with female genital organs and menstrual cycle: Secondary | ICD-10-CM | POA: Insufficient documentation

## 2021-01-06 DIAGNOSIS — F1010 Alcohol abuse, uncomplicated: Secondary | ICD-10-CM | POA: Diagnosis not present

## 2021-01-06 DIAGNOSIS — R6889 Other general symptoms and signs: Secondary | ICD-10-CM | POA: Insufficient documentation

## 2021-01-06 DIAGNOSIS — Z79899 Other long term (current) drug therapy: Secondary | ICD-10-CM | POA: Insufficient documentation

## 2021-01-06 DIAGNOSIS — R1030 Lower abdominal pain, unspecified: Secondary | ICD-10-CM | POA: Diagnosis present

## 2021-01-06 DIAGNOSIS — J45909 Unspecified asthma, uncomplicated: Secondary | ICD-10-CM | POA: Diagnosis not present

## 2021-01-06 DIAGNOSIS — Z20822 Contact with and (suspected) exposure to covid-19: Secondary | ICD-10-CM | POA: Insufficient documentation

## 2021-01-06 DIAGNOSIS — F17210 Nicotine dependence, cigarettes, uncomplicated: Secondary | ICD-10-CM | POA: Diagnosis not present

## 2021-01-06 DIAGNOSIS — K2289 Other specified disease of esophagus: Secondary | ICD-10-CM | POA: Diagnosis not present

## 2021-01-06 DIAGNOSIS — R1084 Generalized abdominal pain: Secondary | ICD-10-CM

## 2021-01-06 DIAGNOSIS — K029 Dental caries, unspecified: Secondary | ICD-10-CM | POA: Diagnosis not present

## 2021-01-06 LAB — RESP PANEL BY RT-PCR (FLU A&B, COVID) ARPGX2
Influenza A by PCR: NEGATIVE
Influenza B by PCR: NEGATIVE
SARS Coronavirus 2 by RT PCR: NEGATIVE

## 2021-01-06 LAB — COMPREHENSIVE METABOLIC PANEL
ALT: 30 U/L (ref 0–44)
AST: 52 U/L — ABNORMAL HIGH (ref 15–41)
Albumin: 3.2 g/dL — ABNORMAL LOW (ref 3.5–5.0)
Alkaline Phosphatase: 155 U/L — ABNORMAL HIGH (ref 38–126)
Anion gap: 15 (ref 5–15)
BUN: 11 mg/dL (ref 6–20)
CO2: 22 mmol/L (ref 22–32)
Calcium: 7.8 mg/dL — ABNORMAL LOW (ref 8.9–10.3)
Chloride: 98 mmol/L (ref 98–111)
Creatinine, Ser: 0.5 mg/dL (ref 0.44–1.00)
GFR, Estimated: >60 mL/min (ref >60)
Glucose, Bld: 92 mg/dL (ref 70–99)
Potassium: 3.6 mmol/L (ref 3.5–5.1)
Sodium: 135 mmol/L (ref 135–145)
Total Bilirubin: 0.6 mg/dL (ref 0.3–1.2)
Total Protein: 6.9 g/dL (ref 6.5–8.1)

## 2021-01-06 LAB — URINALYSIS, MICROSCOPIC (REFLEX)

## 2021-01-06 LAB — URINALYSIS, ROUTINE W REFLEX MICROSCOPIC
Bilirubin Urine: NEGATIVE
Glucose, UA: NEGATIVE mg/dL
Nitrite: POSITIVE — AB
Protein, ur: NEGATIVE mg/dL
Specific Gravity, Urine: 1.015 (ref 1.005–1.030)
pH: 6 (ref 5.0–8.0)

## 2021-01-06 LAB — I-STAT BETA HCG BLOOD, ED (MC, WL, AP ONLY): I-stat hCG, quantitative: <5 m[IU]/mL (ref <5)

## 2021-01-06 LAB — RAPID URINE DRUG SCREEN, HOSP PERFORMED
Amphetamines: NOT DETECTED
Barbiturates: NOT DETECTED
Benzodiazepines: NOT DETECTED
Cocaine: NOT DETECTED
Opiates: NOT DETECTED
Tetrahydrocannabinol: NOT DETECTED

## 2021-01-06 LAB — CBC
HCT: 41.1 % (ref 36.0–46.0)
Hemoglobin: 13.4 g/dL (ref 12.0–15.0)
MCH: 25.9 pg — ABNORMAL LOW (ref 26.0–34.0)
MCHC: 32.6 g/dL (ref 30.0–36.0)
MCV: 79.5 fL — ABNORMAL LOW (ref 80.0–100.0)
Platelets: 215 10*3/uL (ref 150–400)
RBC: 5.17 MIL/uL — ABNORMAL HIGH (ref 3.87–5.11)
RDW: 24.2 % — ABNORMAL HIGH (ref 11.5–15.5)
WBC: 5.4 10*3/uL (ref 4.0–10.5)
nRBC: 0 % (ref 0.0–0.2)

## 2021-01-06 LAB — LIPASE, BLOOD: Lipase: 71 U/L — ABNORMAL HIGH (ref 11–51)

## 2021-01-06 LAB — MAGNESIUM: Magnesium: 1.9 mg/dL (ref 1.7–2.4)

## 2021-01-06 LAB — ETHANOL: Alcohol, Ethyl (B): 263 mg/dL — ABNORMAL HIGH (ref <10)

## 2021-01-06 MED ORDER — AMOXICILLIN 500 MG PO CAPS: 500.0000 mg | ORAL_CAPSULE | Freq: Three times a day (TID) | ORAL | 0 refills | Status: DC

## 2021-01-06 MED ORDER — SODIUM CHLORIDE 0.9 % IV BOLUS
1000.0000 mL | Freq: Once | INTRAVENOUS | Status: AC
  Administered 2021-01-06: 1000 mL via INTRAVENOUS

## 2021-01-06 MED ORDER — ONDANSETRON HCL 4 MG/2ML IJ SOLN
4.0000 mg | Freq: Once | INTRAMUSCULAR | Status: AC
  Administered 2021-01-06: 4 mg via INTRAVENOUS
  Filled 2021-01-06: qty 2

## 2021-01-06 MED ORDER — ONDANSETRON 4 MG PO TBDP: 4.0000 mg | ORAL_TABLET | Freq: Three times a day (TID) | ORAL | 0 refills | Status: DC | PRN

## 2021-01-06 MED ORDER — LIDOCAINE VISCOUS HCL 2 % MT SOLN
15.0000 mL | Freq: Once | OROMUCOSAL | Status: AC
  Administered 2021-01-06: 15 mL via OROMUCOSAL
  Filled 2021-01-06: qty 15

## 2021-01-06 MED ORDER — FAMOTIDINE IN NACL 20-0.9 MG/50ML-% IV SOLN
20.0000 mg | Freq: Once | INTRAVENOUS | Status: AC
  Administered 2021-01-06: 20 mg via INTRAVENOUS
  Filled 2021-01-06: qty 50

## 2021-01-06 MED ORDER — IOHEXOL 350 MG/ML SOLN
80.0000 mL | Freq: Once | INTRAVENOUS | Status: AC | PRN
  Administered 2021-01-06: 80 mL via INTRAVENOUS

## 2021-01-06 MED ORDER — CHLORDIAZEPOXIDE HCL 25 MG PO CAPS: ORAL_CAPSULE | ORAL | 0 refills | Status: DC

## 2021-01-06 MED ORDER — ACETAMINOPHEN 500 MG PO TABS
1000.0000 mg | ORAL_TABLET | Freq: Once | ORAL | Status: AC
  Administered 2021-01-06: 1000 mg via ORAL
  Filled 2021-01-06: qty 2

## 2021-01-06 MED ORDER — PANTOPRAZOLE SODIUM 20 MG PO TBEC: 20.0000 mg | DELAYED_RELEASE_TABLET | Freq: Every day | ORAL | 0 refills | Status: DC

## 2021-01-06 NOTE — Discharge Instructions (Addendum)
Try to stop drinking alcohol.  Start taking your usual medications again.

## 2021-01-06 NOTE — ED Notes (Signed)
Pt states that she is unable to give a urine sample at this time

## 2021-01-06 NOTE — ED Triage Notes (Addendum)
Per EMS-complaining of abdominal pain-drinking Vodka for the past 5 days due to a break up-had 2 teeth pulled last week-has not been taking her antibiotics

## 2021-01-06 NOTE — ED Provider Notes (Signed)
[facility] DEPT Provider Note   CSN: [id number] Arrival date & time: 01/06/21  1711     History Chief Complaint  Patient presents with   Abdominal Pain    Cindy Hoffman is a 45 y.o. female.  Pt presents to the ED today with abdominal pain.  Pt said she has been drinking vodka for the past 5 days and has not been taking her meds.  She said she last drank this morning.  She has lower abdominal pain which improved some after having a bowel movement, but it is still hurting.  Pt also said she feels weird all over.  No fevers. She did have a root canal last week and said she has not been taking her abx.       Past Medical History:  Diagnosis Date   Anxiety    Arthritis    knees   Asthma    Chronic pain syndrome    Complication of anesthesia    "STATES WAKES UP AND CANT BREATHE AND NEEDS BREATHING TREATMENT  IN PACU"   Constipation due to opioid therapy    Dysrhythmia    palpitations- "from thyroid"   Fibromyalgia    Fracture of right foot    Gastric ulcer 2018   GERD (gastroesophageal reflux disease)    History of kidney stones    "inbedded"   Migraine    Mirgraine-   Myofacial muscle pain    Obesity    Occipital neuralgia 2016   OSA (obstructive sleep apnea)    Has CPAP   Sleep apnea     NO CPAP--could not tolerates mask   Thyroid disease    Viral respiratory infection 06/26/13   ? influenza    Patient Active Problem List   Diagnosis Date Noted   MRI of brain abnormal 09/02/2020   Hallucinations 09/02/2020   Encephalopathy 09/02/2020   Seizure disorder ([place]) 09/02/2020   Labral tear of hip joint 05/10/2019   Chronic low back pain 08/30/2017   Chronic pain of both knees 08/30/2017   Chronic migraine without aura, with intractable migraine, so stated, with status migrainosus 03/06/2017   Intractable chronic migraine without aura and without status migrainosus 09/29/2016   Occipital neuralgia 12/12/2015   Thyroid nodule  07/19/2013   Right thyroid nodule 06/18/2013   Morbid obesity ([place]) 05/01/2013   Asthma, chronic 02/01/2013   Smoker 02/01/2013    Past Surgical History:  Procedure Laterality Date   COLONOSCOPY      x2   ENDOMETRIAL ABLATION     ENDOMETRIAL ABLATION  07/17/2017   GALLBLADDER SURGERY  06/2016   GASTRIC ROUX-EN-Y  05/2016   HIP ARTHROSCOPY Right 05/10/2019   Procedure: Right hip arthroscopy with labrum repair and  femoroplasty;  Surgeon: Nicholes Stairs, MD;  Location: [facility];  Service: Orthopedics;  Laterality: Right;  2.5 hrs   NASAL SEPTUM SURGERY     THYROID LOBECTOMY Right 07/19/2013   Procedure: THYROID LOBECTOMY;  Surgeon: Harl Bowie, MD;  Location: [facility];  Service: General;  Laterality: Right;   TUBAL LIGATION     WISDOM TOOTH EXTRACTION       OB History   No obstetric history on file.     Family History  Problem Relation Age of Onset   Hypertension Mother    Hypertension Father    Prostate cancer Father    Diabetes Sister    Cancer Paternal Grandmother        breast    Social  History   Tobacco Use   Smoking status: Every Day    Packs/day: 0.50    Years: 0.00    Pack years: 0.00    Types: Cigarettes    Last attempt to quit: 11/2015    Years since quitting: 5.1   Smokeless tobacco: Never   Tobacco comments:    1-2 cigs per day  Vaping Use   Vaping Use: Never used  Substance Use Topics   Alcohol use: Yes    Alcohol/week: 2.0 standard drinks    Types: 2 Glasses of wine per week   Drug use: No    Home Medications Prior to Admission medications   Medication Sig Start Date End Date Taking? Authorizing Provider  amoxicillin (AMOXIL) 500 MG capsule Take 1 capsule (500 mg total) by mouth 3 (three) times daily. 01/06/21  Yes Isla Pence, MD  chlordiazePOXIDE (LIBRIUM) 25 MG capsule '50mg'$  PO TID x 1D, then 25-'50mg'$  PO BID X 1D, then 25-'50mg'$  PO QD X 1D 01/06/21  Yes Isla Pence, MD  ondansetron (ZOFRAN ODT) 4 MG disintegrating tablet Take 1  tablet (4 mg total) by mouth every 8 (eight) hours as needed for nausea or vomiting. 01/06/21  Yes Isla Pence, MD  pantoprazole (PROTONIX) 20 MG tablet Take 1 tablet (20 mg total) by mouth daily. 01/06/21  Yes Isla Pence, MD  albuterol (PROVENTIL HFA;VENTOLIN HFA) 108 (90 BASE) MCG/ACT inhaler Inhale 2 puffs into the lungs every 6 (six) hours as needed for wheezing or shortness of breath.     [provider]  albuterol (PROVENTIL) (2.5 MG/3ML) 0.083% nebulizer solution Take 2.5 mg by nebulization every 6 (six) hours as needed for wheezing or shortness of breath.    [provider]  amitriptyline (ELAVIL) 25 MG tablet TAKE 4 TABLETS (100 MG TOTAL) BY MOUTH AT BEDTIME. 01/01/20   Lomax, Amy, NP  baclofen (LIORESAL) 10 MG tablet TAKE 1 TABS BY MOUTH 3 TIMES DAILY AS NEEDED FOR MUSCLE SPASMS. PLEASE SPECIFY DIRECTIONS. Patient taking differently: Take by mouth 3 (three) times daily as needed for muscle spasms. 06/28/20   Melvenia Beam, MD  dicyclomine (BENTYL) 20 MG tablet Take 20 mg by mouth 3 (three) times daily as needed (abdominal spasms.). 09/10/16   [provider]  EMGALITY 120 MG/ML SOAJ INJECT 120 MG INTO THE SKIN EVERY 30 DAYS. 12/15/20   Melvenia Beam, MD  escitalopram (LEXAPRO) 20 MG tablet Take 20 mg by mouth daily. 04/28/19   [provider]  furosemide (LASIX) 20 MG tablet Take 2 tablets (40 mg total) by mouth daily. 10/03/14   Etta Quill, NP  gabapentin (NEURONTIN) 250 MG/5ML solution Take 900 mg by mouth 3 (three) times daily.    [provider]  hydrOXYzine (ATARAX/VISTARIL) 25 MG tablet Take 25 mg by mouth 2 (two) times daily as needed for anxiety. 12/29/18   [provider]  magnesium 30 MG tablet Take 1 tablet (30 mg total) by mouth 2 (two) times daily. 09/08/20   Pokhrel, Corrie Mckusick, MD  mometasone-formoterol (DULERA) 100-5 MCG/ACT AERO Inhale 2 puffs into the lungs 2 (two) times daily. 02/24/17   [provider]   MOVANTIK 25 MG TABS tablet Take 25 mg by mouth at bedtime. 04/12/19   [provider]  oxyCODONE ER 13.5 MG C12A Take 13.5 mg by mouth 2 (two) times daily.    [provider]  Oxycodone HCl 10 MG TABS Take 1 tablet (10 mg total) by mouth 4 (four) times daily. 09/08/20  Pokhrel, Laxman, MD  potassium chloride SA (KLOR-CON) 20 MEQ tablet Take 1 tablet (20 mEq total) by mouth daily for 7 days. 09/08/20 09/15/20  Pokhrel, Corrie Mckusick, MD  tiZANidine (ZANAFLEX) 4 MG tablet TAKE 1-2 TABLETS (4-8 MG TOTAL) BY MOUTH EVERY 8 (EIGHT) HOURS AS NEEDED FOR MUSCLE SPASMS. 07/27/20   Melvenia Beam, MD  topiramate (TOPAMAX) 200 MG tablet TAKE 1 TABLET (200 MG TOTAL) BY MOUTH AT BEDTIME. 12/15/20   Ward Givens, NP  Vitamin D, Cholecalciferol, 10 MCG (400 UNIT) CAPS Take 1 capsule by mouth daily. 09/08/20   Pokhrel, Corrie Mckusick, MD    Allergies    Beta adrenergic blockers, Nsaids, and Tolmetin  Review of Systems   Review of Systems  HENT:  Positive for dental problem.   Gastrointestinal:  Positive for abdominal pain.  All other systems reviewed and are negative.  Physical Exam Updated Vital Signs BP 121/75   Pulse (!) 109   Temp 98.9 F (37.2 C) (Oral)   Resp 18   SpO2 100%   Physical Exam Vitals and nursing note reviewed.  Constitutional:      Appearance: She is well-developed.  HENT:     Head: Normocephalic and atraumatic.     Mouth/Throat:     Mouth: Mucous membranes are moist.     Dentition: Dental caries present.     Pharynx: Oropharynx is clear.  Eyes:     Extraocular Movements: Extraocular movements intact.     Pupils: Pupils are equal, round, and reactive to light.  Cardiovascular:     Rate and Rhythm: Normal rate and regular rhythm.     Heart sounds: Normal heart sounds.  Pulmonary:     Effort: Pulmonary effort is normal.     Breath sounds: Normal breath sounds.  Abdominal:     General: Abdomen is flat. Bowel sounds are normal.     Palpations: Abdomen is soft.      Tenderness: There is abdominal tenderness in the suprapubic area.  Skin:    General: Skin is warm.     Capillary Refill: Capillary refill takes less than 2 seconds.  Neurological:     General: No focal deficit present.     Mental Status: She is alert and oriented to person, place, and time.  Psychiatric:        Mood and Affect: Mood normal.        Behavior: Behavior normal.    ED Results / Procedures / Treatments   Labs (all labs ordered are listed, but only abnormal results are displayed) Labs Reviewed  LIPASE, BLOOD - Abnormal; Notable for the following components:      Result Value   Lipase 71 (*)    All other components within normal limits  COMPREHENSIVE METABOLIC PANEL - Abnormal; Notable for the following components:   Calcium 7.8 (*)    Albumin 3.2 (*)    AST 52 (*)    Alkaline Phosphatase 155 (*)    All other components within normal limits  CBC - Abnormal; Notable for the following components:   RBC 5.17 (*)    MCV 79.5 (*)    MCH 25.9 (*)    RDW 24.2 (*)    All other components within normal limits  ETHANOL - Abnormal; Notable for the following components:   Alcohol, Ethyl (B) 263 (*)    All other components within normal limits  RESP PANEL BY RT-PCR (FLU A&B, COVID) ARPGX2  MAGNESIUM  RAPID URINE DRUG SCREEN, HOSP PERFORMED  URINALYSIS, ROUTINE W  REFLEX MICROSCOPIC  I-STAT BETA HCG BLOOD, ED ([facility], [facility], [facility] ONLY)    EKG None  Radiology CT ABDOMEN PELVIS W CONTRAST  Result Date: 01/06/2021 CLINICAL DATA:  Acute abdominal pain.  Diffuse pain. EXAM: CT ABDOMEN AND PELVIS WITH CONTRAST TECHNIQUE: Multidetector CT imaging of the abdomen and pelvis was performed using the standard protocol following bolus administration of intravenous contrast. CONTRAST:  73m OMNIPAQUE IOHEXOL 350 MG/ML SOLN COMPARISON:  CT 02/05/2020 FINDINGS: Lower chest: No acute airspace disease or pleural effusion. The heart is normal in size. Mild wall thickening at the gastroesophageal  junction. Hepatobiliary: Diffusely decreased hepatic density typical of steatosis. No focal hepatic lesion. Clips in the gallbladder fossa postcholecystectomy. No biliary dilatation. Pancreas: No ductal dilatation or inflammation. Spleen: Normal in size without focal abnormality. Adrenals/Urinary Tract: Normal adrenal glands. No hydronephrosis or perinephric edema. Homogeneous renal enhancement with symmetric excretion on delayed phase imaging. Small nonobstructing calculi in the right kidney. No ureteral stone. Urinary bladder is partially distended without wall thickening. Stomach/Bowel: Gastric bypass anatomy. Minimal wall thickening of the distal esophagus. Excluded gastric remnant is decompressed. Small amount of fluid in the duodenum. Roux limb is nondilated. No small bowel obstruction or inflammation. Normal appendix. Mild submucosal fatty infiltration of the cecum and ascending colon. No colonic inflammation or pericolonic edema. Sigmoid colon is decompressed. Vascular/Lymphatic: Mild aortic atherosclerosis. No aortic aneurysm. Patent portal vein. No acute vascular findings. No abdominopelvic adenopathy. Reproductive: Unremarkable CT appearance of the uterus which deviates into the left pelvis, similar to prior exam. No adnexal mass. Other: No free air, free fluid, or intra-abdominal fluid collection. No abdominal wall hernia. Musculoskeletal: There are no acute or suspicious osseous abnormalities. IMPRESSION: 1. Mild wall thickening at the gastroesophageal junction, can be seen with reflux or esophagitis. 2. 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 3. Hepatic steatosis. 4. Nonobstructing right nephrolithiasis. Aortic Atherosclerosis (ICD10-I70.0). Electronically Signed   By: MKeith RakeM.D.   On: 01/06/2021 21:09    Procedures Procedures   Medications Ordered in ED Medications  sodium chloride 0.9 % bolus 1,000 mL (0 mLs Intravenous Stopped 01/06/21 2319)  lidocaine (XYLOCAINE) 2 % viscous  mouth solution 15 mL (15 mLs Mouth/Throat Given 01/06/21 2020)  ondansetron (ZOFRAN) injection 4 mg (4 mg Intravenous Given 01/06/21 2040)  iohexol (OMNIPAQUE) 350 MG/ML injection 80 mL (80 mLs Intravenous Contrast Given 01/06/21 2051)  famotidine (PEPCID) IVPB 20 mg premix (0 mg Intravenous Stopped 01/06/21 2242)  acetaminophen (TYLENOL) tablet 1,000 mg (1,000 mg Oral Given 01/06/21 2332)    ED Course  I have reviewed the triage vital signs and the nursing notes.  Pertinent labs & imaging results that were available during my care of the patient were reviewed by me and considered in my medical decision making (see chart for details).    MDM Rules/Calculators/A&P                           CT abd/pelvis shows some thickening in the esophagus.  She likely has some irritation from excessive alcohol intake.  She is given pepcid here and is d/c with protonix.  She is feeling better and is able to tolerate fluids.  She is encouraged to stop drinking.   Return if worse.  F/u with pcp. Final Clinical Impression(s) / ED Diagnoses Final diagnoses:  Alcohol abuse  Generalized abdominal pain  Dental caries    Rx / DC Orders ED Discharge Orders  Ordered    chlordiazePOXIDE (LIBRIUM) 25 MG capsule        01/06/21 2313    amoxicillin (AMOXIL) 500 MG capsule  3 times daily        01/06/21 2313    pantoprazole (PROTONIX) 20 MG tablet  Daily        01/06/21 2314    ondansetron (ZOFRAN ODT) 4 MG disintegrating tablet  Every 8 hours PRN        01/06/21 2314             Isla Pence, MD 01/06/21 2333

## 2021-01-14 ENCOUNTER — Other Ambulatory Visit: Payer: Self-pay | Admitting: Neurology

## 2021-02-04 ENCOUNTER — Ambulatory Visit: Payer: Self-pay | Admitting: Neurology

## 2021-02-04 ENCOUNTER — Telehealth: Payer: Self-pay | Admitting: Neurology

## 2021-02-04 NOTE — Telephone Encounter (Signed)
Dr. Jaynee Eagles is out of office today, patient is scheduled for Botox appointment at 3:30. I have reached out to the patient via MyChart to attempt to reschedule.

## 2021-02-04 NOTE — Telephone Encounter (Signed)
After reviewing patient's chart, it looks like she now has [employer]. I have initiated PA for Botox.

## 2021-02-04 NOTE — Telephone Encounter (Signed)
I called patient attempting to reschedule today's appointment. Her phone went straight to VM. I left a message advising of appointment cancellation and offered 10/11 @ 2:30 appointment. Requested patient call back or respond to MyChart message.

## 2021-02-05 NOTE — Telephone Encounter (Signed)
Received approval from [facility]. [place] #[id number] (02/04/21- 05/02/22).

## 2021-02-09 ENCOUNTER — Encounter (HOSPITAL_COMMUNITY): Payer: Self-pay

## 2021-02-09 ENCOUNTER — Ambulatory Visit: Payer: Medicare HMO | Admitting: Neurology

## 2021-02-09 ENCOUNTER — Emergency Department (HOSPITAL_COMMUNITY)
Admission: EM | Admit: 2021-02-09 | Discharge: 2021-02-09 | Disposition: A | Discharge status: Home / Self Care | Payer: Medicare HMO | Attending: Emergency Medicine | Admitting: Emergency Medicine

## 2021-02-09 DIAGNOSIS — Z5321 Procedure and treatment not carried out due to patient leaving prior to being seen by health care provider: Secondary | ICD-10-CM | POA: Diagnosis not present

## 2021-02-09 DIAGNOSIS — M545 Low back pain, unspecified: Secondary | ICD-10-CM | POA: Diagnosis not present

## 2021-02-09 DIAGNOSIS — R519 Headache, unspecified: Secondary | ICD-10-CM | POA: Insufficient documentation

## 2021-02-09 NOTE — ED Notes (Signed)
Called for pt 2x no response

## 2021-02-09 NOTE — ED Triage Notes (Signed)
BIB EMS complains of headache and back pain refusing to state when it started or rate the pain level.  BP 162/110 HR 88 O2 99%

## 2021-02-09 NOTE — ED Notes (Signed)
Unable to locate patient multiple times by PA .

## 2021-02-09 NOTE — ED Provider Notes (Signed)
Attempted to find patient in lobby call x2. Was not able to [facility] patient.   Pati Gallo [place], [place] 02/09/21 Leonides Schanz    Sherwood Gambler, MD 02/10/21 2223

## 2021-02-09 NOTE — ED Notes (Signed)
Pt yelling and screaming in the lobby, demanding she speak with someone, asking employees for their full name and employee number so she can write a complaint.

## 2021-03-05 ENCOUNTER — Ambulatory Visit: Payer: Medicare HMO | Admitting: Neurology

## 2021-04-17 ENCOUNTER — Other Ambulatory Visit: Payer: Self-pay

## 2021-04-17 ENCOUNTER — Emergency Department (HOSPITAL_COMMUNITY)
Admission: EM | Admit: 2021-04-17 | Discharge: 2021-04-17 | Disposition: A | Discharge status: Home / Self Care | Payer: Medicare HMO | Attending: Emergency Medicine | Admitting: Emergency Medicine

## 2021-04-17 ENCOUNTER — Emergency Department (HOSPITAL_COMMUNITY): Payer: Medicare HMO

## 2021-04-17 ENCOUNTER — Encounter (HOSPITAL_COMMUNITY): Payer: Self-pay | Admitting: Emergency Medicine

## 2021-04-17 DIAGNOSIS — Z79899 Other long term (current) drug therapy: Secondary | ICD-10-CM | POA: Insufficient documentation

## 2021-04-17 DIAGNOSIS — Y900 Blood alcohol level of less than 20 mg/100 ml: Secondary | ICD-10-CM | POA: Insufficient documentation

## 2021-04-17 DIAGNOSIS — F17210 Nicotine dependence, cigarettes, uncomplicated: Secondary | ICD-10-CM | POA: Insufficient documentation

## 2021-04-17 DIAGNOSIS — R42 Dizziness and giddiness: Secondary | ICD-10-CM | POA: Insufficient documentation

## 2021-04-17 DIAGNOSIS — J45909 Unspecified asthma, uncomplicated: Secondary | ICD-10-CM | POA: Diagnosis not present

## 2021-04-17 DIAGNOSIS — R519 Headache, unspecified: Secondary | ICD-10-CM | POA: Diagnosis present

## 2021-04-17 LAB — CBC WITH DIFFERENTIAL/PLATELET
Abs Immature Granulocytes: 0.01 10*3/uL (ref 0.00–0.07)
Basophils Absolute: 0 10*3/uL (ref 0.0–0.1)
Basophils Relative: 1 %
Eosinophils Absolute: 0 10*3/uL (ref 0.0–0.5)
Eosinophils Relative: 1 %
HCT: 37.7 % (ref 36.0–46.0)
Hemoglobin: 11.5 g/dL — ABNORMAL LOW (ref 12.0–15.0)
Immature Granulocytes: 0 %
Lymphocytes Relative: 22 %
Lymphs Abs: 0.8 10*3/uL (ref 0.7–4.0)
MCH: 23 pg — ABNORMAL LOW (ref 26.0–34.0)
MCHC: 30.5 g/dL (ref 30.0–36.0)
MCV: 75.2 fL — ABNORMAL LOW (ref 80.0–100.0)
Monocytes Absolute: 0.3 10*3/uL (ref 0.1–1.0)
Monocytes Relative: 10 %
Neutro Abs: 2.3 10*3/uL (ref 1.7–7.7)
Neutrophils Relative %: 66 %
Platelets: 493 10*3/uL — ABNORMAL HIGH (ref 150–400)
RBC: 5.01 MIL/uL (ref 3.87–5.11)
RDW: 20.1 % — ABNORMAL HIGH (ref 11.5–15.5)
WBC: 3.5 10*3/uL — ABNORMAL LOW (ref 4.0–10.5)
nRBC: 0 % (ref 0.0–0.2)

## 2021-04-17 LAB — BASIC METABOLIC PANEL
Anion gap: 13 (ref 5–15)
BUN: 6 mg/dL (ref 6–20)
CO2: 24 mmol/L (ref 22–32)
Calcium: 8.4 mg/dL — ABNORMAL LOW (ref 8.9–10.3)
Chloride: 96 mmol/L — ABNORMAL LOW (ref 98–111)
Creatinine, Ser: 0.6 mg/dL (ref 0.44–1.00)
GFR, Estimated: >60 mL/min (ref >60)
Glucose, Bld: 98 mg/dL (ref 70–99)
Potassium: 3 mmol/L — ABNORMAL LOW (ref 3.5–5.1)
Sodium: 133 mmol/L — ABNORMAL LOW (ref 135–145)

## 2021-04-17 LAB — TROPONIN I (HIGH SENSITIVITY): Troponin I (High Sensitivity): 5 ng/L (ref <18)

## 2021-04-17 LAB — ETHANOL: Alcohol, Ethyl (B): <10 mg/dL (ref <10)

## 2021-04-17 MED ORDER — DIPHENHYDRAMINE HCL 50 MG/ML IJ SOLN
25.0000 mg | Freq: Once | INTRAMUSCULAR | Status: AC
  Administered 2021-04-17: 25 mg via INTRAVENOUS
  Filled 2021-04-17: qty 1

## 2021-04-17 MED ORDER — POTASSIUM CHLORIDE CRYS ER 20 MEQ PO TBCR: 40.0000 meq | EXTENDED_RELEASE_TABLET | Freq: Once | ORAL | Status: DC

## 2021-04-17 MED ORDER — PROCHLORPERAZINE EDISYLATE 10 MG/2ML IJ SOLN
10.0000 mg | Freq: Once | INTRAMUSCULAR | Status: AC
  Administered 2021-04-17: 10 mg via INTRAVENOUS
  Filled 2021-04-17: qty 2

## 2021-04-17 NOTE — ED Provider Notes (Signed)
[facility] EMERGENCY DEPARTMENT Provider Note   CSN: [id number] Arrival date & time: 04/17/21  1212     History Chief Complaint  Patient presents with   Headache    Cindy Hoffman is a 45 y.o. female.  45 year old female with prior medical history as detailed below presents for evaluation.  Patient complains of feeling dizzy, lightheaded, and of mild headache.  Symptoms appear to have been ongoing since she was given a rituximab infusion with [place] neurology.  The infusion was performed on 12/07 at [facility].  Patient complains of continued persistent symptoms since this infusion.  She did not contact [place] neurology regarding her complaints.  She denies associated fever.  She denies nausea or vomiting.  She reports that she did not take anything at home for her symptoms.  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  Patient reports last rituximab infusion was on December 7.  Patient complains of persistent symptoms as described above since this infusion.  The history is provided by the patient.  Illness Location:  Dizziness, headache, weakness Severity:  Mild Onset quality:  Gradual Duration:  8 days Timing:  Constant Progression:  Worsening Chronicity:  New     Past Medical History:  Diagnosis Date   Anxiety    Arthritis    knees   Asthma    Chronic pain syndrome    Complication of anesthesia    "STATES WAKES UP AND CANT BREATHE AND NEEDS BREATHING TREATMENT  IN PACU"   Constipation due to opioid therapy    Dysrhythmia    palpitations- "from thyroid"   Fibromyalgia    Fracture of right foot    Gastric ulcer 2018   GERD (gastroesophageal reflux disease)    History of kidney stones    "inbedded"   Migraine    Mirgraine-    Myofacial muscle pain    Obesity    Occipital neuralgia 2016   OSA (obstructive sleep apnea)    Has CPAP   Sleep apnea     NO CPAP--could not tolerates mask   Thyroid disease    Viral respiratory infection 06/26/13   ? influenza    Patient Active Problem List   Diagnosis Date Noted   MRI of brain abnormal 09/02/2020   Hallucinations 09/02/2020   Encephalopathy 09/02/2020   Seizure disorder ([place]) 09/02/2020   Labral tear of hip joint 05/10/2019   Chronic low back pain 08/30/2017   Chronic pain of both knees 08/30/2017   Chronic migraine without aura, with intractable migraine, so stated, with status migrainosus 03/06/2017   Intractable chronic migraine without aura and without status migrainosus 09/29/2016   Occipital neuralgia 12/12/2015   Thyroid nodule 07/19/2013   Right thyroid nodule 06/18/2013   Morbid obesity ([place]) 05/01/2013   Asthma, chronic 02/01/2013   Smoker 02/01/2013    Past Surgical History:  Procedure Laterality Date   COLONOSCOPY      x2   ENDOMETRIAL ABLATION     ENDOMETRIAL ABLATION  07/17/2017   GALLBLADDER SURGERY  06/2016   GASTRIC ROUX-EN-Y  05/2016   HIP ARTHROSCOPY Right 05/10/2019   Procedure: Right hip arthroscopy with labrum repair and  femoroplasty;  Surgeon: Nicholes Stairs, MD;  Location: [facility];  Service: Orthopedics;  Laterality: Right;  2.5 hrs   NASAL SEPTUM SURGERY  THYROID LOBECTOMY Right 07/19/2013   Procedure: THYROID LOBECTOMY;  Surgeon: Harl Bowie, MD;  Location: [facility];  Service: General;  Laterality: Right;   TUBAL LIGATION     WISDOM TOOTH EXTRACTION       OB History   No obstetric history on file.     Family History  Problem Relation Age of Onset   Hypertension Mother    Hypertension Father    Prostate cancer Father    Diabetes Sister    Cancer Paternal Grandmother        breast    Social History   Tobacco Use   Smoking status: Every Day    Packs/day: 0.50    Years: 0.00    Pack years: 0.00     Types: Cigarettes    Last attempt to quit: 11/2015    Years since quitting: 5.4   Smokeless tobacco: Never   Tobacco comments:    1-2 cigs per day  Vaping Use   Vaping Use: Never used  Substance Use Topics   Alcohol use: Yes    Alcohol/week: 2.0 standard drinks    Types: 2 Glasses of wine per week   Drug use: No    Home Medications Prior to Admission medications   Medication Sig Start Date End Date Taking? Authorizing Provider  albuterol (PROVENTIL HFA;VENTOLIN HFA) 108 (90 BASE) MCG/ACT inhaler Inhale 2 puffs into the lungs every 6 (six) hours as needed for wheezing or shortness of breath.     [provider]  albuterol (PROVENTIL) (2.5 MG/3ML) 0.083% nebulizer solution Take 2.5 mg by nebulization every 6 (six) hours as needed for wheezing or shortness of breath.    [provider]  amitriptyline (ELAVIL) 25 MG tablet TAKE 4 TABLETS (100 MG TOTAL) BY MOUTH AT BEDTIME. 01/01/20   Lomax, Amy, NP  amoxicillin (AMOXIL) 500 MG capsule Take 1 capsule (500 mg total) by mouth 3 (three) times daily. 01/06/21   Isla Pence, MD  baclofen (LIORESAL) 10 MG tablet TAKE 1 TABS BY MOUTH 3 TIMES DAILY AS NEEDED FOR MUSCLE SPASMS. PLEASE SPECIFY DIRECTIONS. Patient taking differently: Take by mouth 3 (three) times daily as needed for muscle spasms. 06/28/20   Melvenia Beam, MD  chlordiazePOXIDE (LIBRIUM) 25 MG capsule 50mg  PO TID x 1D, then 25-50mg  PO BID X 1D, then 25-50mg  PO QD X 1D 01/06/21   Isla Pence, MD  dicyclomine (BENTYL) 20 MG tablet Take 20 mg by mouth 3 (three) times daily as needed (abdominal spasms.). 09/10/16   [provider]  EMGALITY 120 MG/ML SOAJ INJECT 120 MG INTO THE SKIN EVERY 30 DAYS. 12/15/20   Melvenia Beam, MD  escitalopram (LEXAPRO) 20 MG tablet Take 20 mg by mouth daily. 04/28/19   [provider]  furosemide (LASIX) 20 MG tablet Take 2 tablets (40 mg total) by mouth daily. 10/03/14   Etta Quill, NP  gabapentin (NEURONTIN) 250  MG/5ML solution Take 900 mg by mouth 3 (three) times daily.    [provider]  hydrOXYzine (ATARAX/VISTARIL) 25 MG tablet Take 25 mg by mouth 2 (two) times daily as needed for anxiety. 12/29/18   [provider]  magnesium 30 MG tablet Take 1 tablet (30 mg total) by mouth 2 (two) times daily. 09/08/20   Pokhrel, Corrie Mckusick, MD  mometasone-formoterol (DULERA) 100-5 MCG/ACT AERO Inhale 2 puffs into the lungs 2 (two) times daily. 02/24/17   [provider]  MOVANTIK 25 MG TABS tablet Take 25 mg by mouth at bedtime.  04/12/19   [provider]  ondansetron (ZOFRAN ODT) 4 MG disintegrating tablet Take 1 tablet (4 mg total) by mouth every 8 (eight) hours as needed for nausea or vomiting. 01/06/21   Isla Pence, MD  oxyCODONE ER 13.5 MG C12A Take 13.5 mg by mouth 2 (two) times daily.    [provider]  Oxycodone HCl 10 MG TABS Take 1 tablet (10 mg total) by mouth 4 (four) times daily. 09/08/20   Pokhrel, Corrie Mckusick, MD  pantoprazole (PROTONIX) 20 MG tablet Take 1 tablet (20 mg total) by mouth daily. 01/06/21   Isla Pence, MD  potassium chloride SA (KLOR-CON) 20 MEQ tablet Take 1 tablet (20 mEq total) by mouth daily for 7 days. 09/08/20 09/15/20  Pokhrel, Corrie Mckusick, MD  tiZANidine (ZANAFLEX) 4 MG tablet TAKE 1-2 TABLETS (4-8 MG TOTAL) BY MOUTH EVERY 8 (EIGHT) HOURS AS NEEDED FOR MUSCLE SPASMS. 01/14/21   Melvenia Beam, MD  topiramate (TOPAMAX) 200 MG tablet TAKE 1 TABLET (200 MG TOTAL) BY MOUTH AT BEDTIME. 12/15/20   Ward Givens, NP  Vitamin D, Cholecalciferol, 10 MCG (400 UNIT) CAPS Take 1 capsule by mouth daily. 09/08/20   Pokhrel, Corrie Mckusick, MD    Allergies    Beta adrenergic blockers, Nsaids, and Tolmetin  Review of Systems   Review of Systems  All other systems reviewed and are negative.  Physical Exam Updated Vital Signs BP 130/81 (BP Location: Right Arm)    Pulse (!) 102    Temp 98.3 F (36.8 C) (Oral)    Resp 18    SpO2 100%   Physical Exam  ED Results /  Procedures / Treatments   Labs (all labs ordered are listed, but only abnormal results are displayed) Labs Reviewed  BASIC METABOLIC PANEL  CBC WITH DIFFERENTIAL/PLATELET  ETHANOL  TROPONIN I (HIGH SENSITIVITY)    EKG None  Radiology CT Head Wo Contrast  Result Date: 04/17/2021 CLINICAL DATA:  Headache, chronic, new features or increased frequency. EXAM: CT HEAD WITHOUT CONTRAST TECHNIQUE: Contiguous axial images were obtained from the base of the skull through the vertex without intravenous contrast. COMPARISON:  02/05/2020 FINDINGS: Brain: Chronic small focus of low density in the anterior left parietal subcortical white matter on sequence 3, image 21. No evidence for acute hemorrhage, mass lesion, midline shift, hydrocephalus or large infarct. Vascular: No hyperdense vessel or unexpected calcification. Skull: Normal. Negative for fracture or focal lesion. Sinuses/Orbits: No acute finding. Other: None. IMPRESSION: No acute intracranial abnormality. Electronically Signed   By: Markus Daft M.D.   On: 04/17/2021 13:28    Procedures Procedures   Medications Ordered in ED Medications  diphenhydrAMINE (BENADRYL) injection 25 mg (has no administration in time range)  prochlorperazine (COMPAZINE) injection 10 mg (has no administration in time range)       ED Course  I have reviewed the triage vital signs and the nursing notes.  Pertinent labs & imaging results that were available during my care of the patient were reviewed by me and considered in my medical decision making (see chart for details).    MDM Rules/Calculators/A&P                         MDM  MSE complete  Cindy Hoffman was evaluated in Emergency Department on 04/17/2021 for the symptoms described in the history of present illness. She was evaluated in the context of the global COVID-19 pandemic, which necessitated consideration that the patient might be at risk for  infection with the SARS-CoV-2 virus that causes  COVID-19. Institutional protocols and algorithms that pertain to the evaluation of patients at risk for COVID-19 are in a state of rapid change based on information released by regulatory bodies including the CDC and federal and state organizations. These policies and algorithms were followed during the patient's care in the ED.  Patient is presenting with complaint of dizziness, headache, lightheadedness.  This appears to have begun since recent rituximab infusion.  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  Case briefly discussed with Dr. Malen Gauze of Neurology.  He recommends treating patient's symptoms for possible migraine.  He recommends MRI with and without contrast of the brain to fully evaluate reported prior history of CNS demyelinating disease.  Additionally, patient is status post recent rituximab infusion.  Screening labs ordered.  MRI ordered.  Compazine and Benadryl administered for treatment of reported headache.  Noncontrast head CT is without acute pathology.  Pending MRI and disposition signed out to Dr. Tamera Punt.     Final Clinical Impression(s) / ED Diagnoses Final diagnoses:  Nonintractable headache, unspecified chronicity pattern, unspecified headache type    Rx / DC Orders ED Discharge Orders     None        Valarie Merino, MD 04/18/21 [phone]

## 2021-04-17 NOTE — ED Triage Notes (Signed)
Patient with history of migraines complains of headache that started two days ago. Patient denies having a prescription for rescue medication for migraines. Patient is alert and oriented at this time.

## 2021-04-17 NOTE — ED Provider Notes (Cosign Needed Addendum)
Emergency Medicine Provider Triage Evaluation Note  LULIA SCHRINER , a 45 y.o. female  was evaluated in triage.  Pt complains of confusion, dizziness, chest pain since last Thursday after she got an infusion from her neurologist. Patient states "I need a CT of my head". Patient unable to tell me what the infusion was or who her doctor is. 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  Review of Systems  Positive: Headaches, lightheadedness, confusion, CP Negative: Fevers, vomiting, diarrhea  Physical Exam  BP 130/81 (BP Location: Right Arm)    Pulse (!) 102    Temp 98.3 F (36.8 C) (Oral)    Resp 18    SpO2 100%  Gen:   Awake, no distress   Resp:  Normal effort  MSK:   Moves extremities without difficulty  Other:  No neuro deficits noted on exam. Patient is alert and oriented x4.  Medical Decision Making  Medically screening exam initiated at 12:30 PM.  Appropriate orders placed.  DREW HERMAN was informed that the remainder of the evaluation will be completed by another provider, this initial triage assessment does not replace that evaluation, and the importance of remaining in the ED until their evaluation is complete.        Azucena Cecil, PA-C 04/17/21 1237

## 2021-04-17 NOTE — ED Provider Notes (Signed)
Patient was seen by Dr. Francia Greaves.  She was awaiting MRI brain.  She eloped from the ED without telling anyone that she was leaving.   Malvin Johns, MD 04/17/21 [phone]

## 2021-05-04 ENCOUNTER — Other Ambulatory Visit: Payer: Self-pay | Admitting: Neurology

## 2021-05-06 NOTE — Telephone Encounter (Signed)
I have reached out to our local rep to see if they are aware of the backorder and if they have other pharmacies they are aware of that have access to the medication.

## 2021-05-20 ENCOUNTER — Emergency Department (HOSPITAL_COMMUNITY)
Admission: EM | Admit: 2021-05-20 | Discharge: 2021-05-20 | Discharge status: Against Medical Advice | Payer: Medicare HMO | Attending: Emergency Medicine | Admitting: Emergency Medicine

## 2021-05-20 ENCOUNTER — Encounter (HOSPITAL_COMMUNITY): Payer: Self-pay | Admitting: *Deleted

## 2021-05-20 ENCOUNTER — Other Ambulatory Visit: Payer: Self-pay

## 2021-05-20 DIAGNOSIS — Z7952 Long term (current) use of systemic steroids: Secondary | ICD-10-CM | POA: Insufficient documentation

## 2021-05-20 DIAGNOSIS — G8929 Other chronic pain: Secondary | ICD-10-CM

## 2021-05-20 DIAGNOSIS — R200 Anesthesia of skin: Secondary | ICD-10-CM | POA: Diagnosis not present

## 2021-05-20 DIAGNOSIS — R42 Dizziness and giddiness: Secondary | ICD-10-CM

## 2021-05-20 DIAGNOSIS — Z5321 Procedure and treatment not carried out due to patient leaving prior to being seen by health care provider: Secondary | ICD-10-CM | POA: Diagnosis not present

## 2021-05-20 DIAGNOSIS — R519 Headache, unspecified: Secondary | ICD-10-CM | POA: Diagnosis not present

## 2021-05-20 DIAGNOSIS — J45909 Unspecified asthma, uncomplicated: Secondary | ICD-10-CM | POA: Insufficient documentation

## 2021-05-20 DIAGNOSIS — F17210 Nicotine dependence, cigarettes, uncomplicated: Secondary | ICD-10-CM | POA: Diagnosis not present

## 2021-05-20 DIAGNOSIS — R431 Parosmia: Secondary | ICD-10-CM

## 2021-05-20 MED ORDER — SODIUM CHLORIDE 0.9 % IV BOLUS: 1000.0000 mL | Freq: Once | INTRAVENOUS | Status: DC

## 2021-05-20 MED ORDER — DIPHENHYDRAMINE HCL 50 MG/ML IJ SOLN
12.5000 mg | Freq: Once | INTRAMUSCULAR | Status: DC
  Filled 2021-05-20: qty 1

## 2021-05-20 MED ORDER — PROCHLORPERAZINE EDISYLATE 10 MG/2ML IJ SOLN
10.0000 mg | Freq: Once | INTRAMUSCULAR | Status: DC
  Filled 2021-05-20: qty 2

## 2021-05-20 NOTE — ED Notes (Signed)
Rn went to start IV and pt was not in bed. Belongings also gone. RN searched ER and pt was not found. Pt eloped.

## 2021-05-20 NOTE — ED Provider Notes (Signed)
[place] EMERGENCY DEPARTMENT Provider Note   CSN: [id number] Arrival date & time: 05/20/21  2104     History  Chief Complaint  Patient presents with   Headache   Dizziness    Cindy Hoffman is a 46 y.o. female with a hx of migraines, tobacco abuse, asthma, OSA, occipital neuralgia, and seizures who presents to the ED with multiple neurologic complaints over the past 1-2 months.   Patient reports intermittent headaches in variable locations including occipital, frontal and temporal bilaterally for the past 1-2 months. No alleviating/aggravating factors. No specific triggers. She has also had bilateral eye blurry vision fairly constantly. Notes numbness in her hands and weakness in her legs. She feels dizzy like the room is spinning which is leading to trouble walking, dizziness is worse with movement, no alleviating factors, remains present when she is still. Today she started to smell smoke like a match being lit fairly persistently however she has not been around any matches today. She does smoke cigarettes but states this smell is different from that- she has not experienced this previously. She feels nauseous at times. She denies fever, vomiting, loss of vision, syncope, or chest pain.    HPI     Home Medications Prior to Admission medications   Medication Sig Start Date End Date Taking? Authorizing Provider  albuterol (PROVENTIL HFA;VENTOLIN HFA) 108 (90 BASE) MCG/ACT inhaler Inhale 2 puffs into the lungs every 6 (six) hours as needed for wheezing or shortness of breath.     [provider]  albuterol (PROVENTIL) (2.5 MG/3ML) 0.083% nebulizer solution Take 2.5 mg by nebulization every 6 (six) hours as needed for wheezing or shortness of breath.    [provider]  amitriptyline (ELAVIL) 25 MG tablet TAKE 4 TABLETS (100 MG TOTAL) BY MOUTH AT BEDTIME. 01/01/20   Lomax, Amy, NP  amoxicillin (AMOXIL) 500 MG capsule Take 1 capsule (500 mg total)  by mouth 3 (three) times daily. 01/06/21   Isla Pence, MD  baclofen (LIORESAL) 10 MG tablet TAKE 1 TABS BY MOUTH 3 TIMES DAILY AS NEEDED FOR MUSCLE SPASMS. PLEASE SPECIFY DIRECTIONS. Patient taking differently: Take by mouth 3 (three) times daily as needed for muscle spasms. 06/28/20   Melvenia Beam, MD  chlordiazePOXIDE (LIBRIUM) 25 MG capsule 50mg  PO TID x 1D, then 25-50mg  PO BID X 1D, then 25-50mg  PO QD X 1D 01/06/21   Isla Pence, MD  clindamycin (CLEOCIN) 300 MG capsule Take 300 mg by mouth 3 (three) times daily. 03/05/21   [provider]  cyanocobalamin (,VITAMIN B-12,) 1000 MCG/ML injection Inject 1,000 mcg into the muscle every 28 (twenty-eight) days. 03/31/21   [provider]  dicyclomine (BENTYL) 20 MG tablet Take 20 mg by mouth 3 (three) times daily as needed (abdominal spasms.). 09/10/16   [provider]  EMGALITY 120 MG/ML SOAJ INJECT 120 MG INTO THE SKIN EVERY 30 DAYS. 05/06/21   Melvenia Beam, MD  escitalopram (LEXAPRO) 20 MG tablet Take 20 mg by mouth daily. 04/28/19   [provider]  famotidine (PEPCID) 40 MG tablet Take 40 mg by mouth daily. 12/12/20   [provider]  furosemide (LASIX) 20 MG tablet Take 2 tablets (40 mg total) by mouth daily. 10/03/14   Etta Quill, NP  gabapentin (NEURONTIN) 250 MG/5ML solution Take 900 mg by mouth 3 (three) times daily.    [provider]  hydrOXYzine (ATARAX/VISTARIL) 25 MG tablet Take 25 mg by mouth 2 (two) times daily as  needed for anxiety. 12/29/18   [provider]  lidocaine (XYLOCAINE) 2 % solution Use as directed 5 mLs in the mouth or throat as needed. 03/10/21   [provider]  magnesium 30 MG tablet Take 1 tablet (30 mg total) by mouth 2 (two) times daily. 09/08/20   Pokhrel, Corrie Mckusick, MD  mometasone-formoterol (DULERA) 100-5 MCG/ACT AERO Inhale 2 puffs into the lungs 2 (two) times daily. 02/24/17   [provider]  montelukast (SINGULAIR) 10 MG tablet  Take 10 mg by mouth daily. 03/06/21   [provider]  MOVANTIK 25 MG TABS tablet Take 25 mg by mouth at bedtime. 04/12/19   [provider]  ondansetron (ZOFRAN ODT) 4 MG disintegrating tablet Take 1 tablet (4 mg total) by mouth every 8 (eight) hours as needed for nausea or vomiting. 01/06/21   Isla Pence, MD  oxyCODONE ER 13.5 MG C12A Take 13.5 mg by mouth 2 (two) times daily.    [provider]  Oxycodone HCl 10 MG TABS Take 1 tablet (10 mg total) by mouth 4 (four) times daily. 09/08/20   Pokhrel, Corrie Mckusick, MD  pantoprazole (PROTONIX) 20 MG tablet Take 1 tablet (20 mg total) by mouth daily. 01/06/21   Isla Pence, MD  potassium chloride SA (KLOR-CON) 20 MEQ tablet Take 1 tablet (20 mEq total) by mouth daily for 7 days. 09/08/20 09/15/20  Pokhrel, Corrie Mckusick, MD  promethazine (PHENERGAN) 12.5 MG tablet Take 12.5 mg by mouth daily as needed. 02/03/21   [provider]  tiZANidine (ZANAFLEX) 4 MG tablet TAKE 1-2 TABLETS (4-8 MG TOTAL) BY MOUTH EVERY 8 (EIGHT) HOURS AS NEEDED FOR MUSCLE SPASMS. 01/14/21   Melvenia Beam, MD  topiramate (TOPAMAX) 200 MG tablet TAKE 1 TABLET (200 MG TOTAL) BY MOUTH AT BEDTIME. 12/15/20   Ward Givens, NP  Vitamin D, Cholecalciferol, 10 MCG (400 UNIT) CAPS Take 1 capsule by mouth daily. 09/08/20   Pokhrel, Corrie Mckusick, MD      Allergies    Beta adrenergic blockers, Nsaids, and Tolmetin    Review of Systems   Review of Systems  Constitutional:  Negative for fever.  Eyes:  Negative for visual disturbance.  Respiratory:  Negative for shortness of breath.   Cardiovascular:  Negative for chest pain.  Gastrointestinal:  Positive for nausea. Negative for abdominal pain and vomiting.  Neurological:  Positive for dizziness, weakness, numbness and headaches. Negative for seizures and syncope.  All other systems reviewed and are negative.  Physical Exam Updated Vital Signs BP 139/89    Pulse 100    Temp 98.2 F (36.8 C) (Oral)    Resp 16    Ht  5\' 4"  (1.626 m)    Wt 83.9 kg    LMP  (LMP Unknown)    SpO2 100%    BMI 31.75 kg/m  Physical Exam Vitals and nursing note reviewed.  Constitutional:      General: She is not in acute distress.    Appearance: She is well-developed. She is not toxic-appearing.  HENT:     Head: Normocephalic and atraumatic.  Eyes:     General:        Right eye: No discharge.        Left eye: No discharge.     Extraocular Movements: Extraocular movements intact.     Conjunctiva/sclera: Conjunctivae normal.  Cardiovascular:     Rate and Rhythm: Normal rate and regular rhythm.  Pulmonary:     Effort: Pulmonary effort is normal. No respiratory distress.  Breath sounds: Normal breath sounds. No wheezing, rhonchi or rales.  Abdominal:     General: There is no distension.     Palpations: Abdomen is soft.     Tenderness: There is no abdominal tenderness.  Musculoskeletal:     Cervical back: Neck supple.  Skin:    General: Skin is warm and dry.     Findings: No rash.  Neurological:     Mental Status: She is alert.     Comments: Clear speech. Slow to answer questions at times. PERRL. EOMI. No facial droop. Patient reports subjective decreased sensation to her hands bilaterally. Symmetric intact grip strength. Variable strength with lower extremities- she is able to flex/extend the knees and plantar/dorsiflex the ankles against gravity, is able to do so somewhat against resistance with variable amounts of strength when tested. Patient does not feel she can stand to walk.   Psychiatric:        Behavior: Behavior normal.    ED Results / Procedures / Treatments   Labs (all labs ordered are listed, but only abnormal results are displayed) Labs Reviewed - No data to display  EKG None  Radiology No results found.  Procedures Procedures    Medications Ordered in ED Medications - No data to display  ED Course/ Medical Decision Making/ A&P                           Medical Decision Making Amount  and/or Complexity of Data Reviewed Labs: ordered. Radiology: ordered.  Risk Prescription drug management.   Patient presents to the ED with multiple neurologic complaints which involve an extensive number of treatment options, and is a complaint that carries with it a high risk of complications and morbidity. Nontoxic, vitals without significant abnormality.    Additional history obtained:  Additional history obtained from chart review  External records from outside source obtained and reviewed including most recent ED visit 04/17/21- presented with headache & dizziness @ that time, case was discussed w/ neurology who recommended MRI however patient eloped prior to receiving this. She has been seen @ [facility] for her neurology needs previously, was thought to have CNS demyelinating disease. Has had abnormal MRIs as detailed below. She received a Rituximab infusion 04/08/21  08/2020:  Mri brain w/wo: Ill-defined areas of white matter hyperintensity bilaterally left greater than right, with progression since 2016. Differential diagnosis includes demyelinating disease, acute or chronic encephalomyelitis, PML.Negative for acute infarct.  No enhancing lesion identified Mri C/T spine w/wo: MRI CERVICAL SPINE IMPRESSION: 1. Motion degraded exam. 2. Patchy cord signal abnormality involving the right hemi cord at the level of C2, nonspecific, but could reflect changes of a demyelinating process/disease versus a nonspecific myelitis, which could be either infectious or inflammatory in nature. No abnormal enhancement. No other convincing cord signal changes on this motion degraded exam. 3. No significant disc pathology, stenosis, or evidence for neural impingement. MRI THORACIC SPINE IMPRESSION: 1. Motion degraded exam. 2. Normal MRI appearance of the thoracic spinal cord. No convincing cord signal abnormality or abnormal enhancement. 3. Multifocal disc protrusions/extrusions at T6-7 through  T11-12, with secondary mild flattening of the ventral spinal cord without significant spinal stenosis. Overall, appearance is similar as compared to 11/15/2017.   ED Course:  Given patient's multitude of neurologic symptoms plan to discuss with neurology service for recommendations regarding imaging- discussed this with the patient who is in agreement.  23:10: CONSULT: Discussed with neurologist Dr .Leonel Ramsay who recommends MRI  brain, C & T spine wwo contrast, plan pending results.   Upon my return to discuss this with the patient she was not present, multiple staff members looked throughout the department for her without success, I tried calling her cell phone number on file which went straight to voicemail. Patient unfortunately eloped from the ED without informing staff members.   Portions of this note were generated with [employer]. Dictation errors may occur despite best attempts at proofreading.        Final Clinical Impression(s) / ED Diagnoses Final diagnoses:  Chronic nonintractable headache, unspecified headache type  Dizziness  Numbness  Abnormal smell    Rx / DC Orders ED Discharge Orders     None         Amaryllis Dyke, PA-C 05/21/21 0002    Mesner, Corene Cornea, MD 05/21/21 [phone]

## 2021-05-20 NOTE — ED Triage Notes (Signed)
The pt has had a headache and dizziness for 3 weeks   nausea and vomiting  lmp  none

## 2021-05-21 ENCOUNTER — Emergency Department (HOSPITAL_COMMUNITY): Payer: Medicare HMO

## 2021-05-21 ENCOUNTER — Encounter (HOSPITAL_COMMUNITY): Payer: Self-pay | Admitting: Emergency Medicine

## 2021-05-21 ENCOUNTER — Emergency Department (HOSPITAL_COMMUNITY)
Admission: EM | Admit: 2021-05-21 | Discharge: 2021-05-21 | Disposition: A | Discharge status: Home / Self Care | Payer: Medicare HMO | Attending: Emergency Medicine | Admitting: Emergency Medicine

## 2021-05-21 ENCOUNTER — Other Ambulatory Visit: Payer: Self-pay

## 2021-05-21 DIAGNOSIS — R519 Headache, unspecified: Secondary | ICD-10-CM | POA: Insufficient documentation

## 2021-05-21 DIAGNOSIS — R42 Dizziness and giddiness: Secondary | ICD-10-CM | POA: Diagnosis not present

## 2021-05-21 DIAGNOSIS — E876 Hypokalemia: Secondary | ICD-10-CM | POA: Insufficient documentation

## 2021-05-21 DIAGNOSIS — R202 Paresthesia of skin: Secondary | ICD-10-CM | POA: Diagnosis not present

## 2021-05-21 DIAGNOSIS — R072 Precordial pain: Secondary | ICD-10-CM | POA: Diagnosis not present

## 2021-05-21 DIAGNOSIS — R531 Weakness: Secondary | ICD-10-CM | POA: Insufficient documentation

## 2021-05-21 DIAGNOSIS — N9489 Other specified conditions associated with female genital organs and menstrual cycle: Secondary | ICD-10-CM | POA: Insufficient documentation

## 2021-05-21 DIAGNOSIS — H538 Other visual disturbances: Secondary | ICD-10-CM | POA: Diagnosis not present

## 2021-05-21 DIAGNOSIS — R079 Chest pain, unspecified: Secondary | ICD-10-CM

## 2021-05-21 LAB — LIPASE, BLOOD: Lipase: 41 U/L (ref 11–51)

## 2021-05-21 LAB — COMPREHENSIVE METABOLIC PANEL
ALT: 31 U/L (ref 0–44)
AST: 42 U/L — ABNORMAL HIGH (ref 15–41)
Albumin: 3.1 g/dL — ABNORMAL LOW (ref 3.5–5.0)
Alkaline Phosphatase: 127 U/L — ABNORMAL HIGH (ref 38–126)
Anion gap: 10 (ref 5–15)
BUN: <5 mg/dL — ABNORMAL LOW (ref 6–20)
CO2: 25 mmol/L (ref 22–32)
Calcium: 8.5 mg/dL — ABNORMAL LOW (ref 8.9–10.3)
Chloride: 102 mmol/L (ref 98–111)
Creatinine, Ser: 0.59 mg/dL (ref 0.44–1.00)
GFR, Estimated: >60 mL/min (ref >60)
Glucose, Bld: 116 mg/dL — ABNORMAL HIGH (ref 70–99)
Potassium: 2.9 mmol/L — ABNORMAL LOW (ref 3.5–5.1)
Sodium: 137 mmol/L (ref 135–145)
Total Bilirubin: 0.7 mg/dL (ref 0.3–1.2)
Total Protein: 6.9 g/dL (ref 6.5–8.1)

## 2021-05-21 LAB — CBC WITH DIFFERENTIAL/PLATELET
Abs Immature Granulocytes: 0 10*3/uL (ref 0.00–0.07)
Basophils Absolute: 0.1 10*3/uL (ref 0.0–0.1)
Basophils Relative: 1 %
Eosinophils Absolute: 0.1 10*3/uL (ref 0.0–0.5)
Eosinophils Relative: 1 %
HCT: 34 % — ABNORMAL LOW (ref 36.0–46.0)
Hemoglobin: 10.4 g/dL — ABNORMAL LOW (ref 12.0–15.0)
Lymphocytes Relative: 17 %
Lymphs Abs: 1.1 10*3/uL (ref 0.7–4.0)
MCH: 22.7 pg — ABNORMAL LOW (ref 26.0–34.0)
MCHC: 30.6 g/dL (ref 30.0–36.0)
MCV: 74.2 fL — ABNORMAL LOW (ref 80.0–100.0)
Monocytes Absolute: 0.3 10*3/uL (ref 0.1–1.0)
Monocytes Relative: 4 %
Neutro Abs: 4.9 10*3/uL (ref 1.7–7.7)
Neutrophils Relative %: 77 %
Platelets: 395 10*3/uL (ref 150–400)
RBC: 4.58 MIL/uL (ref 3.87–5.11)
RDW: 22 % — ABNORMAL HIGH (ref 11.5–15.5)
WBC: 6.4 10*3/uL (ref 4.0–10.5)
nRBC: 0 % (ref 0.0–0.2)
nRBC: 0 /100 WBC

## 2021-05-21 LAB — TROPONIN I (HIGH SENSITIVITY)
Troponin I (High Sensitivity): 4 ng/L
Troponin I (High Sensitivity): 3 ng/L

## 2021-05-21 LAB — I-STAT BETA HCG BLOOD, ED (MC, WL, AP ONLY): I-stat hCG, quantitative: <5 m[IU]/mL (ref <5)

## 2021-05-21 MED ORDER — GADOBUTROL 1 MMOL/ML IV SOLN
8.0000 mL | Freq: Once | INTRAVENOUS | Status: AC | PRN
  Administered 2021-05-21: 8 mL via INTRAVENOUS

## 2021-05-21 MED ORDER — POTASSIUM CHLORIDE CRYS ER 20 MEQ PO TBCR
40.0000 meq | EXTENDED_RELEASE_TABLET | Freq: Once | ORAL | Status: AC
  Administered 2021-05-21: 40 meq via ORAL
  Filled 2021-05-21: qty 2

## 2021-05-21 MED ORDER — LORAZEPAM 2 MG/ML IJ SOLN: 1.0000 mg | Freq: Once | INTRAMUSCULAR | Status: DC

## 2021-05-21 NOTE — ED Provider Notes (Signed)
[facility] EMERGENCY DEPARTMENT Provider Note   CSN: [id number] Arrival date & time: 05/21/21  0955     History  Chief Complaint  Patient presents with   Chest Pain    Cindy Hoffman is a 46 y.o. female for evaluation of multiple complaints.  Intermittent headaches over the last 3-4 months associated weakness.  No known trauma.  Has intermittent blurred vision.  Notes numbness and weakness in her extremities over the last 3 months, intermittent in nature.  Feels dizzy however unable to describe it.  No sudden onset thunderclap headache.  States she intermittently smells like things are burning however cannot find objective correlate with this.  Was seen for similar symptoms in December, spoke with neuro at that time, recommended MRI however patient eloped.  Seen again 24 hours ago, spoke with neurology at that time recommend MR brain, cervical, thoracic however patient eloped again.  Patient stated she cannot wait that long.  Her headaches remain unchanged.  She also notes today that she has had persistent chest pain over the last month.  Nonexertional, nonpleuritic in nature.  Points to her substernal, epigastric region.  Does not radiate into her back.  Not worse with food intake.  No associate diaphoresis, nausea, vomiting.  No back pain.  She does have a chronic cough which she relates to her tobacco use.  No known history of AAA, dissections.  Describes pain as aching, nagging.  No urinary complaints.  Denies chance of pregnancy.  No syncope, emergency drugs, fever, vomiting.  HPI     Home Medications Prior to Admission medications   Medication Sig Start Date End Date Taking? Authorizing Provider  albuterol (PROVENTIL HFA;VENTOLIN HFA) 108 (90 BASE) MCG/ACT inhaler Inhale 2 puffs into the lungs every 6 (six) hours as needed for wheezing or shortness of breath.    Yes [provider]  albuterol (PROVENTIL) (2.5 MG/3ML) 0.083% nebulizer solution Take 2.5  mg by nebulization every 6 (six) hours as needed for wheezing or shortness of breath.   Yes [provider]  amitriptyline (ELAVIL) 25 MG tablet TAKE 4 TABLETS (100 MG TOTAL) BY MOUTH AT BEDTIME. 01/01/20  Yes Lomax, Amy, NP  baclofen (LIORESAL) 10 MG tablet TAKE 1 TABS BY MOUTH 3 TIMES DAILY AS NEEDED FOR MUSCLE SPASMS. PLEASE SPECIFY DIRECTIONS. Patient taking differently: Take by mouth 3 (three) times daily as needed for muscle spasms. 06/28/20  Yes Melvenia Beam, MD  dicyclomine (BENTYL) 20 MG tablet Take 20 mg by mouth 3 (three) times daily as needed (abdominal spasms.). 09/10/16  Yes [provider]  EMGALITY 120 MG/ML SOAJ INJECT 120 MG INTO THE SKIN EVERY 30 DAYS. Patient taking differently: Inject 120 mg into the skin every 30 (thirty) days. 05/06/21  Yes Melvenia Beam, MD  escitalopram (LEXAPRO) 20 MG tablet Take 20 mg by mouth at bedtime. 04/28/19  Yes [provider]  famotidine (PEPCID) 40 MG tablet Take 40 mg by mouth daily. 12/12/20  Yes [provider]  hydrOXYzine (ATARAX/VISTARIL) 25 MG tablet Take 25 mg by mouth 2 (two) times daily as needed for anxiety. 12/29/18  Yes [provider]  montelukast (SINGULAIR) 10 MG tablet Take 10 mg by mouth daily. 03/06/21  Yes [provider]  MOVANTIK 25 MG TABS tablet Take 25 mg by mouth at bedtime. 04/12/19  Yes [provider]  pantoprazole (PROTONIX) 40 MG tablet Take 40 mg by mouth daily. 04/29/21  Yes [provider]  tiZANidine (ZANAFLEX) 4 MG  tablet TAKE 1-2 TABLETS (4-8 MG TOTAL) BY MOUTH EVERY 8 (EIGHT) HOURS AS NEEDED FOR MUSCLE SPASMS. 01/14/21  Yes Melvenia Beam, MD  cyanocobalamin (,VITAMIN B-12,) 1000 MCG/ML injection Inject 1,000 mcg into the muscle every 28 (twenty-eight) days. Patient not taking: Reported on 05/21/2021 03/31/21   [provider]  ondansetron (ZOFRAN ODT) 4 MG disintegrating tablet Take 1 tablet (4 mg total) by mouth every 8 (eight)  hours as needed for nausea or vomiting. Patient not taking: Reported on 05/21/2021 01/06/21   Isla Pence, MD  topiramate (TOPAMAX) 200 MG tablet TAKE 1 TABLET (200 MG TOTAL) BY MOUTH AT BEDTIME. Patient not taking: Reported on 05/21/2021 12/15/20   Ward Givens, NP      Allergies    Beta adrenergic blockers, Nsaids, and Tolmetin    Review of Systems   Review of Systems  Constitutional:  Positive for activity change and appetite change.  HENT:  Positive for congestion and rhinorrhea.   Respiratory:  Positive for cough. Negative for apnea, choking, chest tightness, shortness of breath, wheezing and stridor.   Cardiovascular:  Positive for chest pain.  Gastrointestinal: Negative.   Genitourinary: Negative.   Musculoskeletal: Negative.   Skin: Negative.   Neurological:  Positive for dizziness, weakness, numbness and headaches.  All other systems reviewed and are negative.  Physical Exam Updated Vital Signs BP 129/76 (BP Location: Left Arm)    Pulse 87    Temp 98.9 F (37.2 C) (Oral)    Resp 16    LMP  (LMP Unknown)    SpO2 99%  Physical Exam Physical Exam  Constitutional: Pt is oriented to person, place, and time. Pt appears well-developed and well-nourished. No distress.  HENT:  Head: Normocephalic and atraumatic.  Mouth/Throat: Oropharynx is clear and moist.  Eyes: Conjunctivae and EOM are normal. Pupils are equal, round, and reactive to light. No scleral icterus.  No horizontal, vertical or rotational nystagmus  Neck: Normal range of motion. Neck supple.  Full active and passive ROM without pain No midline or paraspinal tenderness No nuchal rigidity or meningeal signs  Cardiovascular: Normal rate, regular rhythm and intact distal pulses.   Pulmonary/Chest: Effort normal and breath sounds normal. No respiratory distress. Pt has no wheezes. No rales.  Reproducible lower substernal, epigastric tenderness. Abdominal: Soft. Bowel sounds are normal.  Minimal tenderness to  epigastric region there is no rebound and no guarding.  Negative Murphy sign, McBurney point.  No midline pulsatile abdominal mass. Musculoskeletal: Normal range of motion.  No bony tenderness.  Compartments soft. Lymphadenopathy:    No cervical adenopathy.  Neurological: Pt. is alert and oriented to person, place, and time. He has normal reflexes. No cranial nerve deficit.  Exhibits normal muscle tone. Coordination normal.  Mental Status:  Alert, oriented, thought content appropriate. Speech fluent without evidence of aphasia. Able to follow 2 step commands without difficulty.  Cranial Nerves:  II:  Peripheral visual fields grossly normal, pupils equal, round, reactive to light III,IV, VI: ptosis not present, extra-ocular motions intact bilaterally  V,VII: smile symmetric, facial light touch sensation equal VIII: hearing grossly normal bilaterally  IX,X: midline uvula rise  XI: bilateral shoulder shrug equal and strong XII: midline tongue extension  Motor:  5/5 in upper and lower extremities bilaterally including strong and equal grip strength and dorsiflexion/plantar flexion. Subjective weakness in extremities Sensory: Pinprick and light touch normal in all extremities.  Deep Tendon Reflexes: 2+ and symmetric  Cerebellar: normal finger-to-nose with bilateral upper extremities Gait: normal gait and  balance CV: distal pulses palpable throughout   Skin: Skin is warm and dry. No rash noted. Pt is not diaphoretic.  Psychiatric: Pt has a normal mood and affect. Behavior is normal. Judgment and thought content normal.  Nursing note and vitals reviewed.  ED Results / Procedures / Treatments   Labs (all labs ordered are listed, but only abnormal results are displayed) Labs Reviewed  CBC WITH DIFFERENTIAL/PLATELET - Abnormal; Notable for the following components:      Result Value   Hemoglobin 10.4 (*)    HCT 34.0 (*)    MCV 74.2 (*)    MCH 22.7 (*)    RDW 22.0 (*)    All other components  within normal limits  COMPREHENSIVE METABOLIC PANEL - Abnormal; Notable for the following components:   Potassium 2.9 (*)    Glucose, Bld 116 (*)    BUN <5 (*)    Calcium 8.5 (*)    Albumin 3.1 (*)    AST 42 (*)    Alkaline Phosphatase 127 (*)    All other components within normal limits  LIPASE, BLOOD  I-STAT BETA HCG BLOOD, ED ([facility], [facility], [facility] ONLY)  TROPONIN I (HIGH SENSITIVITY)  TROPONIN I (HIGH SENSITIVITY)    EKG EKG Interpretation  Date/Time:  Thursday May 21 2021 10:15:11 EST Ventricular Rate:  84 PR Interval:  121 QRS Duration: 104 QT Interval:  420 QTC Calculation: 497 R Axis:   78 Text Interpretation: Sinus rhythm Low voltage, precordial leads Borderline prolonged QT interval No significant change since last tracing Confirmed by Isla Pence [phone]) on 05/21/2021 10:30:39 AM  Radiology DG Chest 2 View  Result Date: 05/21/2021 CLINICAL DATA:  Chest pain EXAM: CHEST - 2 VIEW COMPARISON:  Chest x-ray dated August 28, 2020 FINDINGS: The heart size and mediastinal contours are within normal limits. Both lungs are clear. The visualized skeletal structures are unremarkable. IMPRESSION: No active cardiopulmonary disease. Electronically Signed   By: Yetta Glassman M.D.   On: 05/21/2021 10:36   MR Brain W and Wo Contrast  Result Date: 05/21/2021 CLINICAL DATA:  Dizziness, headache EXAM: MRI HEAD WITHOUT AND WITH CONTRAST TECHNIQUE: Multiplanar, multiecho pulse sequences of the brain and surrounding structures were obtained without and with intravenous contrast. CONTRAST:  11mL GADAVIST GADOBUTROL 1 MMOL/ML IV SOLN COMPARISON:  09/02/2020 FINDINGS: Evaluation is somewhat limited by motion artifact. Brain: No restricted diffusion to suggest acute or subacute infarct. No acute hemorrhage, mass, mass effect, or midline shift. Redemonstrated T2 hyperintense foci in the bilateral white matter, as well as more ill-defined T2 hyperintense signal, primarily in the left frontal lobe and  left occipital lobe, overall unchanged compared to 09/02/2020. No hydrocephalus or extra-axial collection. No abnormal parenchymal enhancement. No dural thickening or hyperenhancement. No foci of susceptibility to suggest remote hemorrhage. Vascular: Normal flow voids. Skull and upper cervical spine: Normal marrow signal. Sinuses/Orbits: Negative. Other: The mastoids are well aerated. IMPRESSION: 1. No acute intracranial process.  No abnormal enhancement. 2. Redemonstrated ill-defined areas of T2 hyperintense signal in the periventricular white matter, with additional T2 hyperintense foci, which appear unchanged since 09/02/2020. These are nonspecific, with a differential including demyelinating disease, acute or chronic encephalomyelitis, and PML. Electronically Signed   By: Merilyn Baba M.D.   On: 05/21/2021 15:01   MR Cervical Spine W or Wo Contrast  Result Date: 05/21/2021 CLINICAL DATA:  Cervical radiculopathy, mid back pain EXAM: MRI CERVICAL AND THORACIC SPINE WITHOUT AND WITH CONTRAST TECHNIQUE: Multiplanar and multiecho pulse sequences of the  cervical spine, to include the craniocervical junction and cervicothoracic junction, and the thoracic spine, were obtained without and with intravenous contrast. CONTRAST:  32mL GADAVIST GADOBUTROL 1 MMOL/ML IV SOLN COMPARISON:  09/08/2020 MRI cervical and thoracic spine. FINDINGS: Evaluation is somewhat limited by motion artifact. MRI CERVICAL SPINE FINDINGS Alignment: Physiologic. Vertebrae: No fracture, evidence of discitis, or bone lesion. No abnormal enhancement. Cord: Evaluation is limited by motion artifact. Within this limitation, no definite T2 hyperintense foci. The previously questioned lesion is not apparent on the current study and is felt to have been artifactual. No abnormal enhancement. Posterior Fossa, vertebral arteries, paraspinal tissues: Negative paraspinal soft tissues. For findings in the posterior fossa, see same-day brain MRI. Disc levels:  No significant degenerative changes. No significant disc bulges, spinal canal stenosis, or neural foraminal narrowing, although evaluation is limited by motion artifact. MRI THORACIC SPINE FINDINGS Alignment:  No significant listhesis. Vertebrae: No acute fracture or suspicious osseous lesion. T1 and T2 hyperintense foci, consistent with benign hemangiomas. No abnormal marrow enhancement. Cord: Evaluation is significantly limited by motion on the axial sequences. Within this limitation, the cord is normal in signal and morphology. No abnormal cord enhancement. Paraspinal and other soft tissues: Motion limited. Grossly negative. Disc levels: T6-T7: Right paracentral disc protrusion. No spinal canal stenosis or neural foraminal narrowing. T7-T8: Right subarticular disc protrusion. No spinal canal stenosis or neural foraminal narrowing. T9-T10: Left paracentral disc protrusion. No spinal canal stenosis or neural foraminal narrowing. T11-T12 left paracentral disc extrusion with cranial migration, unchanged. No spinal canal stenosis or neural foraminal narrowing. IMPRESSION: 1. Evaluation is limited by motion artifact. Within this limitation, the spinal cord is normal in signal and morphology. No abnormal enhancement. 2. Within the aforementioned limitation, no spinal canal stenosis or neural foraminal narrowing in the cervical spine. 3. Unchanged multifocal disc protrusions and T11-T12 extrusion in the lower thoracic spine, which do not cause significant spinal canal stenosis. Electronically Signed   By: Merilyn Baba M.D.   On: 05/21/2021 15:14   MR THORACIC SPINE W WO CONTRAST  Result Date: 05/21/2021 CLINICAL DATA:  Cervical radiculopathy, mid back pain EXAM: MRI CERVICAL AND THORACIC SPINE WITHOUT AND WITH CONTRAST TECHNIQUE: Multiplanar and multiecho pulse sequences of the cervical spine, to include the craniocervical junction and cervicothoracic junction, and the thoracic spine, were obtained without and with  intravenous contrast. CONTRAST:  15mL GADAVIST GADOBUTROL 1 MMOL/ML IV SOLN COMPARISON:  09/08/2020 MRI cervical and thoracic spine. FINDINGS: Evaluation is somewhat limited by motion artifact. MRI CERVICAL SPINE FINDINGS Alignment: Physiologic. Vertebrae: No fracture, evidence of discitis, or bone lesion. No abnormal enhancement. Cord: Evaluation is limited by motion artifact. Within this limitation, no definite T2 hyperintense foci. The previously questioned lesion is not apparent on the current study and is felt to have been artifactual. No abnormal enhancement. Posterior Fossa, vertebral arteries, paraspinal tissues: Negative paraspinal soft tissues. For findings in the posterior fossa, see same-day brain MRI. Disc levels: No significant degenerative changes. No significant disc bulges, spinal canal stenosis, or neural foraminal narrowing, although evaluation is limited by motion artifact. MRI THORACIC SPINE FINDINGS Alignment:  No significant listhesis. Vertebrae: No acute fracture or suspicious osseous lesion. T1 and T2 hyperintense foci, consistent with benign hemangiomas. No abnormal marrow enhancement. Cord: Evaluation is significantly limited by motion on the axial sequences. Within this limitation, the cord is normal in signal and morphology. No abnormal cord enhancement. Paraspinal and other soft tissues: Motion limited. Grossly negative. Disc levels: T6-T7: Right paracentral disc protrusion. No spinal  canal stenosis or neural foraminal narrowing. T7-T8: Right subarticular disc protrusion. No spinal canal stenosis or neural foraminal narrowing. T9-T10: Left paracentral disc protrusion. No spinal canal stenosis or neural foraminal narrowing. T11-T12 left paracentral disc extrusion with cranial migration, unchanged. No spinal canal stenosis or neural foraminal narrowing. IMPRESSION: 1. Evaluation is limited by motion artifact. Within this limitation, the spinal cord is normal in signal and morphology. No  abnormal enhancement. 2. Within the aforementioned limitation, no spinal canal stenosis or neural foraminal narrowing in the cervical spine. 3. Unchanged multifocal disc protrusions and T11-T12 extrusion in the lower thoracic spine, which do not cause significant spinal canal stenosis. Electronically Signed   By: Merilyn Baba M.D.   On: 05/21/2021 15:14    Procedures Procedures    Medications Ordered in ED Medications  LORazepam (ATIVAN) injection 1 mg (0 mg Intravenous Hold 05/21/21 1106)  potassium chloride SA (KLOR-CON M) CR tablet 40 mEq (40 mEq Oral Given 05/21/21 1136)  gadobutrol (GADAVIST) 1 MMOL/ML injection 8 mL (8 mLs Intravenous Contrast Given 05/21/21 1449)    ED Course/ Medical Decision Making/ A&P    Pleasant 46 year old here for evaluation of multiple complaints.  Patient states she has had intermittent headache, weakness, paresthesias, blurred vision over the last few months.  Has been seen in the emergency department for something similar.  Most recently yesterday.  Previous provider spoke with neuro who recommended MR brain, C/T-spine with and without contrast.  Has eloped her last 2 visits to the ED. Patient does admit to some subjective weakness on exam however has no acute deficits on my exam.  Also admits to some chest pain over the last month.  According to provider note yesterday as well as 1 month prior to that she denied any chest pain.  She also admits to a cough.  Chest pain located substernal region, epigastric region.  Is not radiating to back.  She denies any prior history of AAA, dissections.  No midline pulsatile abdominal mass on exam.  Given length of symptoms I have low suspicion for dissection or ruptured AAA.  We will plan on labs, imaging.  Labs and imaging personally reviewed and interpreted: CBC without leukocytosis, hemoglobin 10.4, similar to prior CMP elevated LFTs, similar to prior, does have hypokalemia supplemented here Lipase 41 Preg neg Trop  4 EKG without ischemic changes MR brain/C/T/ spine  with similar density seen on prior MRI, no acute changes.  Did discuss with on-call neurology Khalindiqua who states no acute findings, patient can follow-up outpatient with neurology  Patient reassessed.  She is currently chest pain, headache free.  Unclear etiology of her symptoms.  She has been seen for this previously.  She has nonfocal neuro exam without deficits, has been chest pain-free over the last 5 hours, low suspicion for acute dissection, ACS, PE, infectious process.  We will have her follow-up outpatient.  Discussed this with patient.  She is agreeable.  The patient has been appropriately medically screened and/or stabilized in the ED. I have low suspicion for any other emergent medical condition which would require further screening, evaluation or treatment in the ED or require inpatient management.  Patient is hemodynamically stable and in no acute distress.  Patient able to ambulate in department prior to ED.  Evaluation does not show acute pathology that would require ongoing or additional emergent interventions while in the emergency department or further inpatient treatment.  I have discussed the diagnosis with the patient and answered all questions.  Pain is been  managed while in the emergency department and patient has no further complaints prior to discharge.  Patient is comfortable with plan discussed in room and is stable for discharge at this time.  I have discussed strict return precautions for returning to the emergency department.  Patient was encouraged to follow-up with PCP/specialist refer to at discharge.                            Medical Decision Making Amount and/or Complexity of Data Reviewed External Data Reviewed: labs, radiology, ECG and notes. Labs: ordered. Decision-making details documented in ED Course. Radiology: ordered and independent interpretation performed. Decision-making details documented in ED  Course. ECG/medicine tests: ordered and independent interpretation performed. Decision-making details documented in ED Course.  Risk OTC drugs. Prescription drug management. Parenteral controlled substances. Diagnosis or treatment significantly limited by social determinants of health. Risk Details: Poor outpatient FU, frequent elopement from ED          Final Clinical Impression(s) / ED Diagnoses Final diagnoses:  Chest pain, unspecified type  Acute nonintractable headache, unspecified headache type    Rx / DC Orders ED Discharge Orders     None         Edmon Magid A, PA-C 05/21/21 Supreme, Julie, MD 05/23/21 [phone]

## 2021-05-21 NOTE — ED Triage Notes (Signed)
Pt arrives via EMS for CP x3 weeks. Pain with inspiration. No nausea/vomiting. Headache for 3 months. EKG was WNL. HR100, BP 140/100.  Pt was at the ED this morning, but left due to not wanting to wait any longer.

## 2021-05-21 NOTE — ED Notes (Signed)
Pt is in MRI  

## 2021-05-21 NOTE — Discharge Instructions (Signed)
Follow-up outpatient with your neurologist  Return for new or worsening symptoms

## 2021-05-30 ENCOUNTER — Encounter: Payer: Self-pay | Admitting: Neurology

## 2021-06-01 MED ORDER — AMITRIPTYLINE HCL 25 MG PO TABS: 100.0000 mg | ORAL_TABLET | Freq: Every day | ORAL | 0 refills | Status: DC

## 2021-06-01 NOTE — Addendum Note (Signed)
Addended by: Gildardo Griffes on: 06/01/2021 02:48 PM   Modules accepted: Orders

## 2021-06-02 ENCOUNTER — Ambulatory Visit: Payer: Medicare HMO | Admitting: Neurology

## 2021-07-25 ENCOUNTER — Encounter (HOSPITAL_COMMUNITY): Payer: Self-pay | Admitting: Emergency Medicine

## 2021-07-25 ENCOUNTER — Other Ambulatory Visit: Payer: Self-pay

## 2021-07-25 ENCOUNTER — Emergency Department (HOSPITAL_COMMUNITY)
Admission: EM | Admit: 2021-07-25 | Discharge: 2021-07-25 | Disposition: A | Discharge status: Home / Self Care | Payer: Medicare HMO | Attending: Emergency Medicine | Admitting: Emergency Medicine

## 2021-07-25 ENCOUNTER — Emergency Department (HOSPITAL_COMMUNITY): Payer: Medicare HMO

## 2021-07-25 DIAGNOSIS — R296 Repeated falls: Secondary | ICD-10-CM | POA: Insufficient documentation

## 2021-07-25 DIAGNOSIS — R42 Dizziness and giddiness: Secondary | ICD-10-CM | POA: Diagnosis not present

## 2021-07-25 DIAGNOSIS — H5711 Ocular pain, right eye: Secondary | ICD-10-CM | POA: Insufficient documentation

## 2021-07-25 DIAGNOSIS — H538 Other visual disturbances: Secondary | ICD-10-CM | POA: Insufficient documentation

## 2021-07-25 DIAGNOSIS — R000 Tachycardia, unspecified: Secondary | ICD-10-CM | POA: Diagnosis not present

## 2021-07-25 DIAGNOSIS — G35 Multiple sclerosis: Secondary | ICD-10-CM | POA: Diagnosis not present

## 2021-07-25 DIAGNOSIS — R531 Weakness: Secondary | ICD-10-CM | POA: Insufficient documentation

## 2021-07-25 DIAGNOSIS — L989 Disorder of the skin and subcutaneous tissue, unspecified: Secondary | ICD-10-CM | POA: Diagnosis not present

## 2021-07-25 DIAGNOSIS — Z20822 Contact with and (suspected) exposure to covid-19: Secondary | ICD-10-CM | POA: Insufficient documentation

## 2021-07-25 DIAGNOSIS — M6281 Muscle weakness (generalized): Secondary | ICD-10-CM | POA: Insufficient documentation

## 2021-07-25 DIAGNOSIS — R519 Headache, unspecified: Secondary | ICD-10-CM | POA: Insufficient documentation

## 2021-07-25 DIAGNOSIS — G43909 Migraine, unspecified, not intractable, without status migrainosus: Secondary | ICD-10-CM | POA: Diagnosis not present

## 2021-07-25 DIAGNOSIS — R079 Chest pain, unspecified: Secondary | ICD-10-CM | POA: Diagnosis present

## 2021-07-25 LAB — COMPREHENSIVE METABOLIC PANEL
ALT: 15 U/L (ref 0–44)
AST: 23 U/L (ref 15–41)
Albumin: 3 g/dL — ABNORMAL LOW (ref 3.5–5.0)
Alkaline Phosphatase: 117 U/L (ref 38–126)
Anion gap: 9 (ref 5–15)
BUN: <5 mg/dL — ABNORMAL LOW (ref 6–20)
CO2: 22 mmol/L (ref 22–32)
Calcium: 8.1 mg/dL — ABNORMAL LOW (ref 8.9–10.3)
Chloride: 105 mmol/L (ref 98–111)
Creatinine, Ser: 0.58 mg/dL (ref 0.44–1.00)
GFR, Estimated: >60 mL/min (ref >60)
Glucose, Bld: 111 mg/dL — ABNORMAL HIGH (ref 70–99)
Potassium: 3.6 mmol/L (ref 3.5–5.1)
Sodium: 136 mmol/L (ref 135–145)
Total Bilirubin: 0.3 mg/dL (ref 0.3–1.2)
Total Protein: 6.4 g/dL — ABNORMAL LOW (ref 6.5–8.1)

## 2021-07-25 LAB — I-STAT BETA HCG BLOOD, ED (MC, WL, AP ONLY): I-stat hCG, quantitative: <5 m[IU]/mL (ref <5)

## 2021-07-25 LAB — CBC WITH DIFFERENTIAL/PLATELET
Abs Immature Granulocytes: 0.02 10*3/uL (ref 0.00–0.07)
Basophils Absolute: 0 10*3/uL (ref 0.0–0.1)
Basophils Relative: 1 %
Eosinophils Absolute: 0.1 10*3/uL (ref 0.0–0.5)
Eosinophils Relative: 2 %
HCT: 32.7 % — ABNORMAL LOW (ref 36.0–46.0)
Hemoglobin: 9.8 g/dL — ABNORMAL LOW (ref 12.0–15.0)
Immature Granulocytes: 0 %
Lymphocytes Relative: 17 %
Lymphs Abs: 1 10*3/uL (ref 0.7–4.0)
MCH: 22.2 pg — ABNORMAL LOW (ref 26.0–34.0)
MCHC: 30 g/dL (ref 30.0–36.0)
MCV: 74.1 fL — ABNORMAL LOW (ref 80.0–100.0)
Monocytes Absolute: 0.4 10*3/uL (ref 0.1–1.0)
Monocytes Relative: 7 %
Neutro Abs: 4.1 10*3/uL (ref 1.7–7.7)
Neutrophils Relative %: 73 %
Platelets: 462 10*3/uL — ABNORMAL HIGH (ref 150–400)
RBC: 4.41 MIL/uL (ref 3.87–5.11)
RDW: 22.9 % — ABNORMAL HIGH (ref 11.5–15.5)
WBC: 5.6 10*3/uL (ref 4.0–10.5)
nRBC: 0 % (ref 0.0–0.2)

## 2021-07-25 LAB — URINALYSIS, ROUTINE W REFLEX MICROSCOPIC
Bilirubin Urine: NEGATIVE
Glucose, UA: NEGATIVE mg/dL
Hgb urine dipstick: NEGATIVE
Ketones, ur: NEGATIVE mg/dL
Nitrite: NEGATIVE
Protein, ur: NEGATIVE mg/dL
Specific Gravity, Urine: 1.006 (ref 1.005–1.030)
pH: 8 (ref 5.0–8.0)

## 2021-07-25 LAB — TROPONIN I (HIGH SENSITIVITY)
Troponin I (High Sensitivity): 4 ng/L (ref <18)
Troponin I (High Sensitivity): 5 ng/L (ref <18)

## 2021-07-25 LAB — RESP PANEL BY RT-PCR (FLU A&B, COVID) ARPGX2
Influenza A by PCR: NEGATIVE
Influenza B by PCR: NEGATIVE
SARS Coronavirus 2 by RT PCR: NEGATIVE

## 2021-07-25 MED ORDER — FENTANYL CITRATE PF 50 MCG/ML IJ SOSY
50.0000 ug | PREFILLED_SYRINGE | Freq: Once | INTRAMUSCULAR | Status: AC
  Administered 2021-07-25: 50 ug via INTRAVENOUS
  Filled 2021-07-25: qty 1

## 2021-07-25 MED ORDER — LACTATED RINGERS IV BOLUS
1000.0000 mL | Freq: Once | INTRAVENOUS | Status: AC
  Administered 2021-07-25: 1000 mL via INTRAVENOUS

## 2021-07-25 MED ORDER — DOXYCYCLINE HYCLATE 100 MG PO CAPS: 100.0000 mg | ORAL_CAPSULE | Freq: Two times a day (BID) | ORAL | 0 refills | Status: DC

## 2021-07-25 MED ORDER — GADOBUTROL 1 MMOL/ML IV SOLN
8.5000 mL | Freq: Once | INTRAVENOUS | Status: AC | PRN
  Administered 2021-07-25: 8.5 mL via INTRAVENOUS

## 2021-07-25 NOTE — ED Triage Notes (Signed)
Pt presents with multiple complaints. C/o intermittent chest pain, dizziness, cough, headache, multiple falls and reports she has wounds to her buttock. ?

## 2021-07-25 NOTE — ED Notes (Signed)
Patient transported to X-ray 

## 2021-07-25 NOTE — Discharge Instructions (Addendum)
If you develop fever, worsening skin lesions, chest pain, shortness of breath, or any other new/concerning symptoms then return to the ER for evaluation. ?

## 2021-07-25 NOTE — ED Provider Notes (Signed)
?[place] ?Provider Note ? ? ?CSN: [id number] ?Arrival date & time: 07/25/21  1554 ? ?  ? ?History ? ?Chief Complaint  ?Patient presents with  ? Dizziness  ? ? ?Cindy Hoffman is a 46 y.o. female. ? ?HPI ?46 year old female presents with multiple complaints.  She states she has a history of multiple sclerosis as well as chronic migraines and presents with chest pain, respiratory symptoms, headaches, falls, and skin lesions.  The skin lesions have been there for about a month or so.  Mostly in her arms, back and that she also has wounds to her low back/sacrum.  These have been about a month and she is concerned about cellulitis which her family member recently had and she is worried she has caught it.  She has chronic falls and has fallen recently with some bumps and bruises but denies any significant trauma.  She has been having chronic headaches since she was last in the emergency department in January that come and go in different spots.  Currently does not have a headache.  Has had on and off chest pain for the last 3 days in addition to rhinorrhea and cough.  Sometimes she gags but no vomiting.  She feels short of breath and has had chest pain as recently as this afternoon around 1 PM.  She reports blurry vision over the last 3 days as well as right eye pain.  Feels generally weak but that is about normal for her, especially in the legs. ? ?Home Medications ?Prior to Admission medications   ?Medication Sig Start Date End Date Taking? Authorizing Provider  ?doxycycline (VIBRAMYCIN) 100 MG capsule Take 1 capsule (100 mg total) by mouth 2 (two) times daily. One po bid x 7 days 07/25/21  Yes Sherwood Gambler, MD  ?albuterol (PROVENTIL HFA;VENTOLIN HFA) 108 (90 BASE) MCG/ACT inhaler Inhale 2 puffs into the lungs every 6 (six) hours as needed for wheezing or shortness of breath.     [provider]  ?albuterol (PROVENTIL) (2.5 MG/3ML) 0.083% nebulizer solution Take 2.5  mg by nebulization every 6 (six) hours as needed for wheezing or shortness of breath.    [provider]  ?amitriptyline (ELAVIL) 25 MG tablet Take 4 tablets (100 mg total) by mouth at bedtime. 06/01/21   Melvenia Beam, MD  ?baclofen (LIORESAL) 10 MG tablet TAKE 1 TABS BY MOUTH 3 TIMES DAILY AS NEEDED FOR MUSCLE SPASMS. PLEASE SPECIFY DIRECTIONS. ?Patient taking differently: Take by mouth 3 (three) times daily as needed for muscle spasms. 06/28/20   Melvenia Beam, MD  ?cyanocobalamin (,VITAMIN B-12,) 1000 MCG/ML injection Inject 1,000 mcg into the muscle every 28 (twenty-eight) days. ?Patient not taking: Reported on 05/21/2021 03/31/21   [provider]  ?dicyclomine (BENTYL) 20 MG tablet Take 20 mg by mouth 3 (three) times daily as needed (abdominal spasms.). 09/10/16   [provider]  ?EMGALITY 120 MG/ML SOAJ INJECT 120 MG INTO THE SKIN EVERY 30 DAYS. ?Patient taking differently: Inject 120 mg into the skin every 30 (thirty) days. 05/06/21   Melvenia Beam, MD  ?escitalopram (LEXAPRO) 20 MG tablet Take 20 mg by mouth at bedtime. 04/28/19   [provider]  ?famotidine (PEPCID) 40 MG tablet Take 40 mg by mouth daily. 12/12/20   [provider]  ?hydrOXYzine (ATARAX/VISTARIL) 25 MG tablet Take 25 mg by mouth 2 (two) times daily as needed for anxiety. 12/29/18   [provider]  ?montelukast (SINGULAIR) 10 MG tablet  Take 10 mg by mouth daily. 03/06/21   [provider]  ?MOVANTIK 25 MG TABS tablet Take 25 mg by mouth at bedtime. 04/12/19   [provider]  ?ondansetron (ZOFRAN ODT) 4 MG disintegrating tablet Take 1 tablet (4 mg total) by mouth every 8 (eight) hours as needed for nausea or vomiting. ?Patient not taking: Reported on 05/21/2021 01/06/21   Isla Pence, MD  ?pantoprazole (PROTONIX) 40 MG tablet Take 40 mg by mouth daily. 04/29/21   [provider]  ?tiZANidine (ZANAFLEX) 4 MG tablet TAKE 1-2 TABLETS (4-8 MG TOTAL) BY MOUTH  EVERY 8 (EIGHT) HOURS AS NEEDED FOR MUSCLE SPASMS. 01/14/21   Melvenia Beam, MD  ?topiramate (TOPAMAX) 200 MG tablet TAKE 1 TABLET (200 MG TOTAL) BY MOUTH AT BEDTIME. ?Patient not taking: Reported on 05/21/2021 12/15/20   Ward Givens, NP  ?   ? ?Allergies    ?Beta adrenergic blockers, Nsaids, and Tolmetin   ? ?Review of Systems   ?Review of Systems  ?HENT:  Positive for rhinorrhea. Negative for sore throat.   ?Eyes:  Positive for pain and visual disturbance.  ?Respiratory:  Positive for cough and shortness of breath.   ?Cardiovascular:  Positive for chest pain.  ?Gastrointestinal:  Negative for vomiting.  ?Neurological:  Positive for weakness and headaches. Negative for numbness.  ? ?Physical Exam ?Updated Vital Signs ?BP (!) 150/97 (BP Location: Right Arm)   Pulse (!) 108   Temp 98.2 ?F (36.8 ?C) (Oral)   Resp 20   SpO2 100%  ?Physical Exam ?Vitals and nursing note reviewed.  ?Constitutional:   ?   Appearance: She is well-developed.  ?HENT:  ?   Head: Normocephalic and atraumatic.  ?Eyes:  ?   Extraocular Movements: Extraocular movements intact.  ?   Pupils: Pupils are equal, round, and reactive to light.  ?Cardiovascular:  ?   Rate and Rhythm: Regular rhythm. Tachycardia present.  ?   Heart sounds: Normal heart sounds.  ?   Comments: HR low 100s ?Pulmonary:  ?   Effort: Pulmonary effort is normal.  ?   Breath sounds: Normal breath sounds.  ?Abdominal:  ?   Palpations: Abdomen is soft.  ?   Tenderness: There is no abdominal tenderness.  ?Skin: ?   General: Skin is warm and dry.  ?   Findings: Lesion present.  ?   Comments: Patient has multiple small erythematous lesions that are infrequent but on both arms and a couple spots on her back. ? ?Over her sacrum she also has a superficial wound with some crusting.  Has a couple other smaller wounds near her buttocks.  No abscess.  No streaking cellulitis.  ?Neurological:  ?   Mental Status: She is alert.  ?   Comments: CN 3-12 grossly intact. 5/5 strength in both  upper extremities. Mild weakness appreciated in lower extremities, chronic and at baseline per patient. Grossly normal sensation. Normal finger to nose.   ? ? ?ED Results / Procedures / Treatments   ?Labs ?(all labs ordered are listed, but only abnormal results are displayed) ?Labs Reviewed  ?COMPREHENSIVE METABOLIC PANEL - Abnormal; Notable for the following components:  ?    Result Value  ? Glucose, Bld 111 (*)   ? BUN <5 (*)   ? Calcium 8.1 (*)   ? Total Protein 6.4 (*)   ? Albumin 3.0 (*)   ? All other components within normal limits  ?CBC WITH DIFFERENTIAL/PLATELET - Abnormal; Notable for the following components:  ?  Hemoglobin 9.8 (*)   ? HCT 32.7 (*)   ? MCV 74.1 (*)   ? MCH 22.2 (*)   ? RDW 22.9 (*)   ? Platelets 462 (*)   ? All other components within normal limits  ?URINALYSIS, ROUTINE W REFLEX MICROSCOPIC - Abnormal; Notable for the following components:  ? Leukocytes,Ua MODERATE (*)   ? Bacteria, UA RARE (*)   ? All other components within normal limits  ?RESP PANEL BY RT-PCR (FLU A&B, COVID) ARPGX2  ?I-STAT BETA HCG BLOOD, ED ([facility], [facility], [facility] ONLY)  ?TROPONIN I (HIGH SENSITIVITY)  ?TROPONIN I (HIGH SENSITIVITY)  ? ? ?EKG ?EKG Interpretation ? ?Date/Time:  Saturday July 25 2021 16:36:58 EDT ?Ventricular Rate:  95 ?PR Interval:  158 ?QRS Duration: 97 ?QT Interval:  370 ?QTC Calculation: 466 ?R Axis:   90 ?Text Interpretation: Sinus rhythm Borderline right axis deviation no acute ST/T changes similar to Jan 2023 Confirmed by Sherwood Gambler [phone]) on 07/25/2021 4:57:42 PM ? ?Radiology ?DG Chest 2 View ? ?Result Date: 07/25/2021 ?CLINICAL DATA:  Cough EXAM: CHEST - 2 VIEW COMPARISON:  05/21/2021 FINDINGS: The heart size and mediastinal contours are within normal limits. Both lungs are clear. The visualized skeletal structures are unremarkable. IMPRESSION: No active cardiopulmonary disease. Electronically Signed   By: Elmer Picker M.D.   On: 07/25/2021 17:18   ? ?Procedures ?Procedures  ? ? ?Medications  Ordered in ED ?Medications  ?fentaNYL (SUBLIMAZE) injection 50 mcg (50 mcg Intravenous Given 07/25/21 1727)  ?lactated ringers bolus 1,000 mL (0 mLs Intravenous Stopped 07/25/21 1937)  ?gadobutrol (GADAVIST) 1

## 2021-08-29 ENCOUNTER — Other Ambulatory Visit: Payer: Self-pay | Admitting: Neurology

## 2021-08-31 ENCOUNTER — Other Ambulatory Visit: Payer: Self-pay | Admitting: Neurology

## 2021-09-01 ENCOUNTER — Encounter (HOSPITAL_COMMUNITY): Payer: Self-pay | Admitting: Emergency Medicine

## 2021-09-01 ENCOUNTER — Other Ambulatory Visit: Payer: Self-pay

## 2021-09-01 ENCOUNTER — Emergency Department (HOSPITAL_COMMUNITY)
Admission: EM | Admit: 2021-09-01 | Discharge: 2021-09-02 | Discharge status: Against Medical Advice | Payer: Medicare HMO | Attending: Emergency Medicine | Admitting: Emergency Medicine

## 2021-09-01 DIAGNOSIS — Z5321 Procedure and treatment not carried out due to patient leaving prior to being seen by health care provider: Secondary | ICD-10-CM | POA: Insufficient documentation

## 2021-09-01 DIAGNOSIS — R21 Rash and other nonspecific skin eruption: Secondary | ICD-10-CM | POA: Diagnosis present

## 2021-09-01 DIAGNOSIS — L0100 Impetigo, unspecified: Secondary | ICD-10-CM | POA: Diagnosis not present

## 2021-09-01 NOTE — ED Notes (Signed)
Patient upset and crying over her care. States she was told in triage that she would see a PA and wasn't able to d/t them being "backed up." Explained that they prioritize patients after reviewing lab work and the acuity number given in triage. Patient wanting to speak to triage RN, however, triage 4 patients behind. She states she is going to [facility] and wanted to complain. Given patient experience number.  ?

## 2021-09-01 NOTE — ED Triage Notes (Signed)
Pt endorses painful rash to arms and hips.  Worried if it is shingles but also states she has hx of impetigo.  Pt has MS as well.  ?

## 2021-09-14 ENCOUNTER — Emergency Department (HOSPITAL_COMMUNITY)
Admission: EM | Admit: 2021-09-14 | Discharge: 2021-09-14 | Discharge status: Against Medical Advice | Payer: Medicare HMO | Attending: Physician Assistant | Admitting: Physician Assistant

## 2021-09-14 ENCOUNTER — Other Ambulatory Visit: Payer: Self-pay

## 2021-09-14 ENCOUNTER — Encounter (HOSPITAL_COMMUNITY): Payer: Self-pay

## 2021-09-14 ENCOUNTER — Emergency Department (HOSPITAL_COMMUNITY): Payer: Medicare HMO

## 2021-09-14 DIAGNOSIS — M79662 Pain in left lower leg: Secondary | ICD-10-CM | POA: Insufficient documentation

## 2021-09-14 DIAGNOSIS — M79661 Pain in right lower leg: Secondary | ICD-10-CM | POA: Insufficient documentation

## 2021-09-14 DIAGNOSIS — R0789 Other chest pain: Secondary | ICD-10-CM | POA: Insufficient documentation

## 2021-09-14 DIAGNOSIS — R197 Diarrhea, unspecified: Secondary | ICD-10-CM | POA: Insufficient documentation

## 2021-09-14 DIAGNOSIS — R058 Other specified cough: Secondary | ICD-10-CM | POA: Insufficient documentation

## 2021-09-14 DIAGNOSIS — R0602 Shortness of breath: Secondary | ICD-10-CM | POA: Diagnosis not present

## 2021-09-14 DIAGNOSIS — R21 Rash and other nonspecific skin eruption: Secondary | ICD-10-CM | POA: Diagnosis not present

## 2021-09-14 DIAGNOSIS — R519 Headache, unspecified: Secondary | ICD-10-CM | POA: Insufficient documentation

## 2021-09-14 DIAGNOSIS — Z5321 Procedure and treatment not carried out due to patient leaving prior to being seen by health care provider: Secondary | ICD-10-CM | POA: Diagnosis not present

## 2021-09-14 LAB — CBC WITH DIFFERENTIAL/PLATELET
Abs Immature Granulocytes: 0 10*3/uL (ref 0.00–0.07)
Basophils Absolute: 0 10*3/uL (ref 0.0–0.1)
Basophils Relative: 0 %
Eosinophils Absolute: 0 10*3/uL (ref 0.0–0.5)
Eosinophils Relative: 0 %
HCT: 33.3 % — ABNORMAL LOW (ref 36.0–46.0)
Hemoglobin: 9.8 g/dL — ABNORMAL LOW (ref 12.0–15.0)
Lymphocytes Relative: 19 %
Lymphs Abs: 1.1 10*3/uL (ref 0.7–4.0)
MCH: 21.7 pg — ABNORMAL LOW (ref 26.0–34.0)
MCHC: 29.4 g/dL — ABNORMAL LOW (ref 30.0–36.0)
MCV: 73.8 fL — ABNORMAL LOW (ref 80.0–100.0)
Monocytes Absolute: 0.2 10*3/uL (ref 0.1–1.0)
Monocytes Relative: 4 %
Neutro Abs: 4.5 10*3/uL (ref 1.7–7.7)
Neutrophils Relative %: 77 %
Platelets: 537 10*3/uL — ABNORMAL HIGH (ref 150–400)
RBC: 4.51 MIL/uL (ref 3.87–5.11)
RDW: 27.4 % — ABNORMAL HIGH (ref 11.5–15.5)
WBC: 5.9 10*3/uL (ref 4.0–10.5)
nRBC: 0 % (ref 0.0–0.2)
nRBC: 0 /100 WBC

## 2021-09-14 LAB — COMPREHENSIVE METABOLIC PANEL
ALT: 15 U/L (ref 0–44)
AST: 21 U/L (ref 15–41)
Albumin: 3.3 g/dL — ABNORMAL LOW (ref 3.5–5.0)
Alkaline Phosphatase: 130 U/L — ABNORMAL HIGH (ref 38–126)
Anion gap: 11 (ref 5–15)
BUN: <5 mg/dL — ABNORMAL LOW (ref 6–20)
CO2: 24 mmol/L (ref 22–32)
Calcium: 8.2 mg/dL — ABNORMAL LOW (ref 8.9–10.3)
Chloride: 102 mmol/L (ref 98–111)
Creatinine, Ser: 0.54 mg/dL (ref 0.44–1.00)
GFR, Estimated: >60 mL/min (ref >60)
Glucose, Bld: 96 mg/dL (ref 70–99)
Potassium: 2.8 mmol/L — ABNORMAL LOW (ref 3.5–5.1)
Sodium: 137 mmol/L (ref 135–145)
Total Bilirubin: 1 mg/dL (ref 0.3–1.2)
Total Protein: 6.9 g/dL (ref 6.5–8.1)

## 2021-09-14 LAB — TROPONIN I (HIGH SENSITIVITY): Troponin I (High Sensitivity): 5 ng/L (ref <18)

## 2021-09-14 NOTE — ED Notes (Signed)
Pt said she was leaving and walked out. ?

## 2021-09-14 NOTE — ED Triage Notes (Signed)
Patient arrives with [facility] from c/o chronic HA, intermittent chest wall pain, bilateral calf pain, a productive cough x1 week, diarrhea, and a rash with "sores popping up all over" pt's body. Sore visualized posteriorly on left side of pt's neck. Pt a&o x4. Pt denies fever. ? ?EMS vitals: ? ?130/82 ?HR 76 ?98 O2 ? ?

## 2021-09-14 NOTE — ED Provider Triage Note (Signed)
Emergency Medicine Provider Triage Evaluation Note ? ?Cindy Hoffman , a 46 y.o. female  was evaluated in triage.  Pt complains of 1 month history of headache, chest pain, shortness of breath, cough productive with mucus and diarrhea.  No sick contacts with similar symptoms.  States that she is smelling smoke.  Denies any fevers, vomiting or abdominal pain. ? ?Review of Systems  ?Positive: Above ?Negative: Above ? ?Physical Exam  ?BP 136/80   Pulse 88   Temp 99.7 ?F (37.6 ?C) (Oral)   Resp 16   Ht 5' 0.5" (1.537 m)   Wt 88.5 kg   SpO2 100%   BMI 37.46 kg/m?  ?Gen:   Awake, no distress   ?Resp:  Normal effort  ?MSK:   Moves extremities without difficulty  ?Other:  Lungs with clear to auscultation bilaterally, abdomen is soft ? ?Medical Decision Making  ?Medically screening exam initiated at 9:41 PM.  Appropriate orders placed.  TSERING LEAMAN was informed that the remainder of the evaluation will be completed by another provider, this initial triage assessment does not replace that evaluation, and the importance of remaining in the ED until their evaluation is complete. ? ?Labs ordered ?  ?Delia Heady, PA-C ?09/14/21 2142 ? ?

## 2021-09-15 ENCOUNTER — Emergency Department (HOSPITAL_COMMUNITY)
Admission: EM | Admit: 2021-09-15 | Discharge: 2021-09-15 | Discharge status: Against Medical Advice | Payer: Medicare HMO

## 2021-09-15 NOTE — ED Notes (Signed)
After transferring from EMS stretcher, patient asked to use the restroom and then walked out of department before triage.  ?

## 2021-09-22 ENCOUNTER — Other Ambulatory Visit: Payer: Self-pay | Admitting: Neurology

## 2021-10-02 ENCOUNTER — Other Ambulatory Visit: Payer: Self-pay | Admitting: Neurology

## 2021-10-09 ENCOUNTER — Other Ambulatory Visit: Payer: Self-pay | Admitting: Neurology

## 2021-10-28 ENCOUNTER — Telehealth: Payer: Self-pay | Admitting: Adult Health

## 2021-10-28 NOTE — Telephone Encounter (Signed)
Pt is requesting a call to schedule a Migraine cocktail, please call.

## 2021-10-28 NOTE — Telephone Encounter (Signed)
Please call pt to schedule her next Botox injection with Dr Jaynee Eagles

## 2021-10-28 NOTE — Telephone Encounter (Signed)
Spoke with Amy NP. She feels pt would be best fit to see MD for eval. Recommend ER if she feels she is having MS flare. See that pt is established with [facility] and they gave her rituxan and asked her to f/u. She saw Dr Felecia Shelling once but case complicated. I called the pt and canceled appt w/ Amy NP for tomorrow. Discussed situation. Pt was very appreciative. She will call [facility] tomorrow and try to get back in with them. She understands ER would be best if she feels she is having MS flare, also can do migraine cocktail, MRI brain. Pt states MRI brain was already done twice this year. She wants to see Dr Jaynee Eagles for migraine f/u. I have her scheduled for august 15th at 730 am. Pt very appreciative for the call.

## 2021-10-28 NOTE — Telephone Encounter (Signed)
Pt hasn't been seen in a year. She will need to go to urgent care or ER for migraine cocktail.

## 2021-10-29 ENCOUNTER — Ambulatory Visit: Payer: Medicare HMO | Admitting: Family Medicine

## 2021-11-04 NOTE — Telephone Encounter (Signed)
I called pt got her scheduled for Botox with Dr. Jaynee Eagles on 11/12/21.

## 2021-11-10 ENCOUNTER — Other Ambulatory Visit: Payer: Self-pay

## 2021-11-10 ENCOUNTER — Encounter (HOSPITAL_COMMUNITY): Payer: Self-pay

## 2021-11-10 ENCOUNTER — Emergency Department (HOSPITAL_COMMUNITY): Payer: Medicare HMO

## 2021-11-10 ENCOUNTER — Other Ambulatory Visit: Payer: Self-pay | Admitting: Neurology

## 2021-11-10 ENCOUNTER — Emergency Department (HOSPITAL_BASED_OUTPATIENT_CLINIC_OR_DEPARTMENT_OTHER): Payer: Medicare HMO

## 2021-11-10 ENCOUNTER — Emergency Department (HOSPITAL_COMMUNITY)
Admission: EM | Admit: 2021-11-10 | Discharge: 2021-11-10 | Disposition: A | Discharge status: Home / Self Care | Payer: Medicare HMO | Attending: Emergency Medicine | Admitting: Emergency Medicine

## 2021-11-10 DIAGNOSIS — R21 Rash and other nonspecific skin eruption: Secondary | ICD-10-CM | POA: Insufficient documentation

## 2021-11-10 DIAGNOSIS — D509 Iron deficiency anemia, unspecified: Secondary | ICD-10-CM

## 2021-11-10 DIAGNOSIS — R609 Edema, unspecified: Secondary | ICD-10-CM | POA: Diagnosis not present

## 2021-11-10 DIAGNOSIS — R1084 Generalized abdominal pain: Secondary | ICD-10-CM

## 2021-11-10 DIAGNOSIS — R300 Dysuria: Secondary | ICD-10-CM | POA: Diagnosis not present

## 2021-11-10 DIAGNOSIS — K5900 Constipation, unspecified: Secondary | ICD-10-CM

## 2021-11-10 DIAGNOSIS — R2242 Localized swelling, mass and lump, left lower limb: Secondary | ICD-10-CM | POA: Insufficient documentation

## 2021-11-10 DIAGNOSIS — R110 Nausea: Secondary | ICD-10-CM | POA: Insufficient documentation

## 2021-11-10 DIAGNOSIS — L299 Pruritus, unspecified: Secondary | ICD-10-CM | POA: Diagnosis not present

## 2021-11-10 HISTORY — DX: Multiple sclerosis: G35

## 2021-11-10 HISTORY — DX: Other encephalitis and encephalomyelitis: G04.81

## 2021-11-10 LAB — CBC WITH DIFFERENTIAL/PLATELET
Abs Immature Granulocytes: 0.01 10*3/uL (ref 0.00–0.07)
Basophils Absolute: 0 10*3/uL (ref 0.0–0.1)
Basophils Relative: 1 %
Eosinophils Absolute: 0.3 10*3/uL (ref 0.0–0.5)
Eosinophils Relative: 5 %
HCT: 27.2 % — ABNORMAL LOW (ref 36.0–46.0)
Hemoglobin: 7.8 g/dL — ABNORMAL LOW (ref 12.0–15.0)
Immature Granulocytes: 0 %
Lymphocytes Relative: 19 %
Lymphs Abs: 1.2 10*3/uL (ref 0.7–4.0)
MCH: 22.3 pg — ABNORMAL LOW (ref 26.0–34.0)
MCHC: 28.7 g/dL — ABNORMAL LOW (ref 30.0–36.0)
MCV: 77.7 fL — ABNORMAL LOW (ref 80.0–100.0)
Monocytes Absolute: 0.7 10*3/uL (ref 0.1–1.0)
Monocytes Relative: 11 %
Neutro Abs: 4 10*3/uL (ref 1.7–7.7)
Neutrophils Relative %: 64 %
Platelets: 395 10*3/uL (ref 150–400)
RBC: 3.5 MIL/uL — ABNORMAL LOW (ref 3.87–5.11)
RDW: 27.2 % — ABNORMAL HIGH (ref 11.5–15.5)
WBC: 6.2 10*3/uL (ref 4.0–10.5)
nRBC: 0 % (ref 0.0–0.2)

## 2021-11-10 LAB — COMPREHENSIVE METABOLIC PANEL
ALT: 15 U/L (ref 0–44)
AST: 18 U/L (ref 15–41)
Albumin: 3.4 g/dL — ABNORMAL LOW (ref 3.5–5.0)
Alkaline Phosphatase: 102 U/L (ref 38–126)
Anion gap: 10 (ref 5–15)
BUN: 9 mg/dL (ref 6–20)
CO2: 22 mmol/L (ref 22–32)
Calcium: 8.6 mg/dL — ABNORMAL LOW (ref 8.9–10.3)
Chloride: 104 mmol/L (ref 98–111)
Creatinine, Ser: 0.52 mg/dL (ref 0.44–1.00)
GFR, Estimated: >60 mL/min (ref >60)
Glucose, Bld: 89 mg/dL (ref 70–99)
Potassium: 3.6 mmol/L (ref 3.5–5.1)
Sodium: 136 mmol/L (ref 135–145)
Total Bilirubin: 0.4 mg/dL (ref 0.3–1.2)
Total Protein: 7.1 g/dL (ref 6.5–8.1)

## 2021-11-10 LAB — URINALYSIS, ROUTINE W REFLEX MICROSCOPIC
Bilirubin Urine: NEGATIVE
Glucose, UA: NEGATIVE mg/dL
Hgb urine dipstick: NEGATIVE
Ketones, ur: NEGATIVE mg/dL
Nitrite: NEGATIVE
Protein, ur: NEGATIVE mg/dL
Specific Gravity, Urine: 1.006 (ref 1.005–1.030)
pH: 6 (ref 5.0–8.0)

## 2021-11-10 LAB — I-STAT BETA HCG BLOOD, ED (MC, WL, AP ONLY): I-stat hCG, quantitative: <5 m[IU]/mL (ref <5)

## 2021-11-10 LAB — LIPASE, BLOOD: Lipase: 26 U/L (ref 11–51)

## 2021-11-10 MED ORDER — LACTULOSE 20 GM/30ML PO SOLN: 20.0000 g | Freq: Three times a day (TID) | ORAL | 0 refills | Status: DC | PRN

## 2021-11-10 MED ORDER — FENTANYL CITRATE PF 50 MCG/ML IJ SOSY
100.0000 ug | PREFILLED_SYRINGE | Freq: Once | INTRAMUSCULAR | Status: AC
  Administered 2021-11-10: 100 ug via INTRAVENOUS
  Filled 2021-11-10: qty 2

## 2021-11-10 MED ORDER — SODIUM CHLORIDE 0.9 % IV BOLUS
500.0000 mL | Freq: Once | INTRAVENOUS | Status: AC
  Administered 2021-11-10: 500 mL via INTRAVENOUS

## 2021-11-10 MED ORDER — SODIUM CHLORIDE (PF) 0.9 % IJ SOLN
INTRAMUSCULAR | Status: AC
  Filled 2021-11-10: qty 50

## 2021-11-10 MED ORDER — ONDANSETRON HCL 4 MG/2ML IJ SOLN
4.0000 mg | Freq: Once | INTRAMUSCULAR | Status: AC
  Administered 2021-11-10: 4 mg via INTRAVENOUS
  Filled 2021-11-10: qty 2

## 2021-11-10 MED ORDER — ACETAMINOPHEN 325 MG PO TABS
650.0000 mg | ORAL_TABLET | Freq: Once | ORAL | Status: AC
  Administered 2021-11-10: 650 mg via ORAL
  Filled 2021-11-10: qty 2

## 2021-11-10 MED ORDER — IOHEXOL 300 MG/ML  SOLN
100.0000 mL | Freq: Once | INTRAMUSCULAR | Status: AC | PRN
  Administered 2021-11-10: 100 mL via INTRAVENOUS

## 2021-11-10 NOTE — ED Triage Notes (Signed)
Patient c/o RLQ abdominal pain since last night and states she has bloating that started today.  Patient c/o urinary hesitancy and dysuria since last night.

## 2021-11-10 NOTE — Discharge Instructions (Signed)
We sent a prescription to take to help with constipation and bloating.  Increase your iron supplementation to 2 pills a day, morning and evening.  Call your PCP for follow-up appointment as soon as possible.  Increase the fiber in your diet to help with constipation.

## 2021-11-10 NOTE — Progress Notes (Signed)
RLE venous duplex has been completed.  Preliminary results given to Domenic Moras, PA-C.   Results can be found under chart review under CV PROC. 11/10/2021 6:14 PM Enriqueta Augusta RVT, RDMS

## 2021-11-10 NOTE — ED Provider Notes (Signed)
[facility] DEPT Provider Note   CSN: [id number] Arrival date & time: 11/10/21  1529     History {Add pertinent medical, surgical, social history, OB history to HPI:1} Chief Complaint  Patient presents with   Dysuria   Abdominal Pain   Nausea    Cindy Hoffman is a 46 y.o. female.  HPI Patient presenting for evaluation of abdominal pain.  She also complains of leg swelling, left, since yesterday.  She has nausea and reported constipation, with vomiting.  She states she has not had a bowel movement several days.  She has had prior intra-abdominal surgeries.  She reports a generalized rash, which is itchy and sometimes drains fluid, for 5 or 6 months.  She is using an unknown medicine, topical ointment on it.  She denies fever, chills, cough or chest pain.  She noticed swelling in both legs, over the last several days.    Home Medications Prior to Admission medications   Medication Sig Start Date End Date Taking? Authorizing Provider  albuterol (PROVENTIL HFA;VENTOLIN HFA) 108 (90 BASE) MCG/ACT inhaler Inhale 2 puffs into the lungs every 6 (six) hours as needed for wheezing or shortness of breath.     [provider]  albuterol (PROVENTIL) (2.5 MG/3ML) 0.083% nebulizer solution Take 2.5 mg by nebulization every 6 (six) hours as needed for wheezing or shortness of breath.    [provider]  amitriptyline (ELAVIL) 25 MG tablet Take 4 tablets (100 mg total) by mouth at bedtime. MUST BE SEEN FOR FURTHER REFILLS. 09/22/21   Melvenia Beam, MD  baclofen (LIORESAL) 10 MG tablet TAKE 1 TABS BY MOUTH 3 TIMES DAILY AS NEEDED FOR MUSCLE SPASMS. PLEASE SPECIFY DIRECTIONS. Patient taking differently: Take by mouth 3 (three) times daily as needed for muscle spasms. 06/28/20   Melvenia Beam, MD  cyanocobalamin (,VITAMIN B-12,) 1000 MCG/ML injection Inject 1,000 mcg into the muscle every 28 (twenty-eight) days. Patient not taking: Reported on  05/21/2021 03/31/21   [provider]  dicyclomine (BENTYL) 20 MG tablet Take 20 mg by mouth 3 (three) times daily as needed (abdominal spasms.). 09/10/16   [provider]  doxycycline (VIBRAMYCIN) 100 MG capsule Take 1 capsule (100 mg total) by mouth 2 (two) times daily. One po bid x 7 days 07/25/21   Sherwood Gambler, MD  escitalopram (LEXAPRO) 20 MG tablet Take 20 mg by mouth at bedtime. 04/28/19   [provider]  famotidine (PEPCID) 40 MG tablet Take 40 mg by mouth daily. 12/12/20   [provider]  Galcanezumab-gnlm (EMGALITY) 120 MG/ML SOAJ Inject 120 mg into the skin every 30 (thirty) days. Must be seen for further refills. 10/06/21   Melvenia Beam, MD  hydrOXYzine (ATARAX/VISTARIL) 25 MG tablet Take 25 mg by mouth 2 (two) times daily as needed for anxiety. 12/29/18   [provider]  montelukast (SINGULAIR) 10 MG tablet Take 10 mg by mouth daily. 03/06/21   [provider]  MOVANTIK 25 MG TABS tablet Take 25 mg by mouth at bedtime. 04/12/19   [provider]  ondansetron (ZOFRAN ODT) 4 MG disintegrating tablet Take 1 tablet (4 mg total) by mouth every 8 (eight) hours as needed for nausea or vomiting. Patient not taking: Reported on 05/21/2021 01/06/21   Isla Pence, MD  pantoprazole (PROTONIX) 40 MG tablet Take 40 mg by mouth daily. 04/29/21   [provider]  tiZANidine (ZANAFLEX) 4 MG tablet Take 1-2 tablets (4-8 mg total) by mouth every  8 (eight) hours as needed for muscle spasms. MUST BE SEEN FOR FURTHER REFILLS. 09/22/21   Melvenia Beam, MD  topiramate (TOPAMAX) 200 MG tablet TAKE 1 TABLET (200 MG TOTAL) BY MOUTH AT BEDTIME. Patient not taking: Reported on 05/21/2021 12/15/20   Ward Givens, NP      Allergies    Beta adrenergic blockers, Nsaids, and Tolmetin    Review of Systems   Review of Systems  Physical Exam Updated Vital Signs BP 130/76   Pulse 69   Temp 97.6 F (36.4 C) (Oral)   Resp 18   Ht 5'  4.5" (1.638 m)   Wt 86.2 kg   SpO2 100%   BMI 32.11 kg/m  Physical Exam Vitals and nursing note reviewed.  Constitutional:      General: She is not in acute distress.    Appearance: She is well-developed. She is obese. She is not ill-appearing, toxic-appearing or diaphoretic.  HENT:     Head: Normocephalic and atraumatic.     Right Ear: External ear normal.     Left Ear: External ear normal.  Eyes:     Conjunctiva/sclera: Conjunctivae normal.     Pupils: Pupils are equal, round, and reactive to light.  Neck:     Trachea: Phonation normal.  Cardiovascular:     Rate and Rhythm: Normal rate and regular rhythm.     Heart sounds: Normal heart sounds.  Pulmonary:     Effort: Pulmonary effort is normal.     Breath sounds: Normal breath sounds.  Abdominal:     Palpations: Abdomen is soft.     Tenderness: There is abdominal tenderness (Right lower quadrant, moderate).  Musculoskeletal:        General: Normal range of motion.     Cervical back: Normal range of motion and neck supple.  Skin:    General: Skin is warm and dry.     Comments: Generalized rash characterized by excoriations 3 to 4 mm, red base without drainage, fluctuance or swelling associated.  No areas of petechiae or vesicles.  Neurological:     Mental Status: She is alert and oriented to person, place, and time.     Cranial Nerves: No cranial nerve deficit.     Sensory: No sensory deficit.     Motor: No abnormal muscle tone.     Coordination: Coordination normal.  Psychiatric:        Mood and Affect: Mood normal.        Behavior: Behavior normal.        Thought Content: Thought content normal.        Judgment: Judgment normal.     ED Results / Procedures / Treatments   Labs (all labs ordered are listed, but only abnormal results are displayed) Labs Reviewed  CBC WITH DIFFERENTIAL/PLATELET - Abnormal; Notable for the following components:      Result Value   RBC 3.50 (*)    Hemoglobin 7.8 (*)    HCT 27.2  (*)    MCV 77.7 (*)    MCH 22.3 (*)    MCHC 28.7 (*)    RDW 27.2 (*)    All other components within normal limits  COMPREHENSIVE METABOLIC PANEL - Abnormal; Notable for the following components:   Calcium 8.6 (*)    Albumin 3.4 (*)    All other components within normal limits  URINALYSIS, ROUTINE W REFLEX MICROSCOPIC - Abnormal; Notable for the following components:   Color, Urine STRAW (*)    Leukocytes,Ua TRACE (*)  Bacteria, UA RARE (*)    All other components within normal limits  LIPASE, BLOOD  I-STAT BETA HCG BLOOD, ED ([facility], [facility], [facility] ONLY)  I-STAT BETA HCG BLOOD, ED ([facility], [facility], [facility] ONLY)    EKG None  Radiology VAS [place] LOWER EXTREMITY VENOUS (DVT) (7a-7p)  Result Date: 11/10/2021  Lower Venous DVT Study Patient Name:  JENILLE LASZLO  Date of Exam:   11/10/2021 Medical Rec #: [id number]        Accession #:    [id number] Date of Birth: 03-May-1976       Patient Gender: F Patient Age:   86 years Exam Location:  [facility] Procedure:      VAS [place] LOWER EXTREMITY VENOUS (DVT) Referring Phys: Domenic Moras --------------------------------------------------------------------------------  Indications: Edema.  Limitations: Poor ultrasound/tissue interface and body habitus. Comparison Study: Previous exam on 10/03/14 was negative for DVT. Performing Technologist: Rogelia Rohrer RVT, RDMS  Examination Guidelines: A complete evaluation includes B-mode imaging, spectral Doppler, color Doppler, and power Doppler as needed of all accessible portions of each vessel. Bilateral testing is considered an integral part of a complete examination. Limited examinations for reoccurring indications may be performed as noted. The reflux portion of the exam is performed with the patient in reverse Trendelenburg.  +-----+---------------+---------+-----------+----------+--------------+ RIGHTCompressibilityPhasicitySpontaneityPropertiesThrombus Aging  +-----+---------------+---------+-----------+----------+--------------+ CFV  Full           Yes      Yes                                 +-----+---------------+---------+-----------+----------+--------------+   +---------+---------------+---------+-----------+----------+-------------------+ LEFT     CompressibilityPhasicitySpontaneityPropertiesThrombus Aging      +---------+---------------+---------+-----------+----------+-------------------+ CFV      Full           Yes      Yes                                      +---------+---------------+---------+-----------+----------+-------------------+ SFJ      Full                                                             +---------+---------------+---------+-----------+----------+-------------------+ FV Prox  Full           Yes      Yes                                      +---------+---------------+---------+-----------+----------+-------------------+ FV Mid   Full           Yes      Yes                                      +---------+---------------+---------+-----------+----------+-------------------+ FV DistalFull           Yes      Yes                                      +---------+---------------+---------+-----------+----------+-------------------+ PFV  Full                                                             +---------+---------------+---------+-----------+----------+-------------------+ POP      Full           Yes      Yes                                      +---------+---------------+---------+-----------+----------+-------------------+ PTV      Full                                         Not well visualized +---------+---------------+---------+-----------+----------+-------------------+ PERO     Full                                         Not well visualized +---------+---------------+---------+-----------+----------+-------------------+    Summary: RIGHT: - No  evidence of common femoral vein obstruction.  LEFT: - There is no evidence of deep vein thrombosis in the lower extremity.  - No cystic structure found in the popliteal fossa. - Subcutaneous edema in area of calf and ankle. - Ultrasound characteristics of enlarged lymph nodes noted in the groin.  *See table(s) above for measurements and observations.    Preliminary     Procedures Procedures  {Document cardiac monitor, telemetry assessment procedure when appropriate:1}  Medications Ordered in ED Medications - No data to display  ED Course/ Medical Decision Making/ A&P                           Medical Decision Making  ***  {Document critical care time when appropriate:1} {Document review of labs and clinical decision tools ie heart score, Chads2Vasc2 etc:1}  {Document your independent review of radiology images, and any outside records:1} {Document your discussion with family members, caretakers, and with consultants:1} {Document social determinants of health affecting pt's care:1} {Document your decision making why or why not admission, treatments were needed:1} Final Clinical Impression(s) / ED Diagnoses Final diagnoses:  None    Rx / DC Orders ED Discharge Orders     None

## 2021-11-10 NOTE — ED Provider Triage Note (Signed)
Emergency Medicine Provider Triage Evaluation Note  Cindy Hoffman , a 46 y.o. female  was evaluated in triage.  Pt complains of abd pain. Report having RLQ pain with nausea, constipation, vomiting and urinary discomfort since last night.  Also report LLE swelling since last night as well. No fever, no back pain.  Has intact appendix.  No hx of PE/DVT  Review of Systems  Positive: As above Negative: As above  Physical Exam  BP (!) 113/57 (BP Location: Left Arm)   Pulse 91   Temp 97.6 F (36.4 C) (Oral)   Resp 18   Ht 5' 4.5" (1.638 m)   Wt 86.2 kg   SpO2 98%   BMI 32.11 kg/m  Gen:   Awake, no distress   Resp:  Normal effort  MSK:   Moves extremities without difficulty  Other:    Medical Decision Making  Medically screening exam initiated at 4:29 PM.  Appropriate orders placed.  Cindy Hoffman was informed that the remainder of the evaluation will be completed by another provider, this initial triage assessment does not replace that evaluation, and the importance of remaining in the ED until their evaluation is complete.     Domenic Moras, PA-C 11/10/21 1633

## 2021-11-10 NOTE — ED Notes (Signed)
POC beta is wrong test. Clicked off accidentally

## 2021-11-12 ENCOUNTER — Other Ambulatory Visit: Payer: Self-pay | Admitting: Neurology

## 2021-11-12 ENCOUNTER — Telehealth: Payer: Self-pay | Admitting: Neurology

## 2021-11-12 ENCOUNTER — Ambulatory Visit: Payer: Medicare HMO | Admitting: Neurology

## 2021-12-03 NOTE — Telephone Encounter (Signed)
I called the patient and got her rescheduled.

## 2021-12-15 ENCOUNTER — Ambulatory Visit: Payer: Medicare HMO | Admitting: Neurology

## 2021-12-15 ENCOUNTER — Telehealth: Payer: Self-pay | Admitting: Neurology

## 2021-12-15 NOTE — Telephone Encounter (Signed)
Sent mychart message

## 2021-12-22 ENCOUNTER — Ambulatory Visit: Payer: Medicare HMO | Admitting: Neurology

## 2021-12-30 ENCOUNTER — Other Ambulatory Visit: Payer: Self-pay | Admitting: Neurology

## 2022-01-06 ENCOUNTER — Ambulatory Visit (INDEPENDENT_AMBULATORY_CARE_PROVIDER_SITE_OTHER): Payer: Medicare HMO | Admitting: Neurology

## 2022-01-06 ENCOUNTER — Telehealth: Payer: Self-pay | Admitting: *Deleted

## 2022-01-06 DIAGNOSIS — G43711 Chronic migraine without aura, intractable, with status migrainosus: Secondary | ICD-10-CM

## 2022-01-06 MED ORDER — EMGALITY 120 MG/ML ~~LOC~~ SOAJ: 120.0000 mg | SUBCUTANEOUS | 11 refills | Status: DC

## 2022-01-06 MED ORDER — TOPIRAMATE 100 MG PO TABS
100.0000 mg | ORAL_TABLET | Freq: Every day | ORAL | 4 refills | Status: DC
Start: 2022-01-06 — End: 2022-11-29

## 2022-01-06 MED ORDER — ONABOTULINUMTOXINA 200 UNITS IJ SOLR
155.0000 [IU] | Freq: Once | INTRAMUSCULAR | Status: AC
  Administered 2022-01-06: 155 [IU] via INTRAMUSCULAR

## 2022-01-06 NOTE — Progress Notes (Signed)
01/06/2022: stable, did not get emgality approved, will reorder topiramate she stopped taking it, prior to botox had daily migraines with botoc was previously reduced > 50% in migriane but still had 15 migraine days a month so tried to order emgality for the remainder, [facility] says didn't fill [facility]  a PA we will check.will reorder emgality and topiramate '100mg'$ . She had tubal ligation and also several procedures cannot get pregnant. Still goes to [facility]. Daily migraines have not seen her in over a month. +a.   Meds ordered this encounter  Medications   botulinum toxin Type A (BOTOX) injection 155 Units    Botox- 200 units x 1 vial Lot: [id number]  Expiration: 06/2023 NDC: 6644-0347-42  Bacteriostatic 0.9% Sodium Chloride- 21m total Lot: [id number] Expiration: 12/02/2022 NDC: [address]-64 Dx: [id number] B/B   topiramate (TOPAMAX) 100 MG tablet    Sig: Take 1 tablet (100 mg total) by mouth at bedtime.    Dispense:  90 tablet    Refill:  4   Galcanezumab-gnlm (EMGALITY) 120 MG/ML SOAJ    Sig: Inject 120 mg into the skin every 30 (thirty) days.    Dispense:  1 mL    Refill:  11     [facility]2020[facility]  07/22/2020: stable, still doing exceptionally well   Consent Form Botulism Toxin Injection For Chronic Migraine    Reviewed orally with patient, additionally signature is on file:  Botulism toxin has been approved by the Federal drug administration for treatment of chronic migraine. Botulism toxin does not cure chronic migraine and it may not be effective in some patients.  The administration of botulism toxin is accomplished by injecting a small amount of toxin into the muscles of the neck and head. Dosage must be titrated for each individual. Any benefits resulting from botulism toxin tend to wear off after 3 months with a repeat injection required if benefit is to be maintained. Injections are usually done every 3-4  months with maximum effect peak achieved by about 2 or 3 weeks. Botulism toxin is expensive and you should be sure of what costs you will incur resulting from the injection.  The side effects of botulism toxin use for chronic migraine may include:   -Transient, and usually mild, facial weakness with facial injections  -Transient, and usually mild, head or neck weakness with head/neck injections  -Reduction or loss of forehead facial animation due to forehead muscle weakness  -Eyelid drooping  -Dry eye  -Pain at the site of injection or bruising at the site of injection  -Double vision  -Potential unknown long term risks  Contraindications: You should not have Botox if you are pregnant, nursing, allergic to albumin, have an infection, skin condition, or muscle weakness at the site of the injection, or have myasthenia gravis, Lambert-Eaton syndrome, or ALS.  It is also possible that as with any injection, there may be an allergic reaction or no effect from the medication. Reduced effectiveness after repeated injections is sometimes seen and rarely infection at the injection site may occur. All care will be taken to prevent these side effects. If therapy is given over a long time, atrophy and wasting in the muscle injected may occur. Occasionally the patient's become refractory to treatment because they develop antibodies to the toxin. In this event, therapy needs to be modified.  I have read the above information and consent to the administration of botulism toxin.  BOTOX PROCEDURE NOTE FOR MIGRAINE HEADACHE    Contraindications and precautions discussed with patient(above). Aseptic procedure was observed and patient tolerated procedure. Procedure performed by Dr. Georgia Dom  The condition has existed for more than 6 months, and pt does not have a diagnosis of ALS, Myasthenia Gravis or Lambert-Eaton Syndrome.  Risks and benefits of injections discussed and pt agrees to proceed with the  procedure.  Written consent obtained  These injections are medically necessary. Pt  receives good benefits from these injections. These injections do not cause sedations or hallucinations which the oral therapies may cause.  Description of procedure:  The patient was placed in a sitting position. The standard protocol was used for Botox as follows, with 5 units of Botox injected at each site:   -Procerus muscle, midline injection  -Corrugator muscle, bilateral injection  -Frontalis muscle, bilateral injection, with 2 sites each side, medial injection was performed in the upper one third of the frontalis muscle, in the region vertical from the medial inferior edge of the superior orbital rim. The lateral injection was again in the upper one third of the forehead vertically above the lateral limbus of the cornea, 1.5 cm lateral to the medial injection site.  -Temporalis muscle injection, 4 sites, bilaterally. The first injection was 3 cm above the tragus of the ear, second injection site was 1.5 cm to 3 cm up from the first injection site in line with the tragus of the ear. The third injection site was 1.5-3 cm forward between the first 2 injection sites. The fourth injection site was 1.5 cm posterior to the second injection site.   -Occipitalis muscle injection, 3 sites, bilaterally. The first injection was done one half way between the occipital protuberance and the tip of the mastoid process behind the ear. The second injection site was done lateral and superior to the first, 1 fingerbreadth from the first injection. The third injection site was 1 fingerbreadth superiorly and medially from the first injection site.  -Cervical paraspinal muscle injection, 2 sites, bilateral knee first injection site was 1 cm from the midline of the cervical spine, 3 cm inferior to the lower border of the occipital protuberance. The second injection site was 1.5 cm superiorly and laterally to the first injection  site.  -Trapezius muscle injection was performed at 3 sites, bilaterally. The first injection site was in the upper trapezius muscle halfway between the inflection point of the neck, and the acromion. The second injection site was one half way between the acromion and the first injection site. The third injection was done between the first injection site and the inflection point of the neck.   Will return for repeat injection in 3 months.   200 units of Botox was used, 45 Botox not injected was wasted. The patient tolerated the procedure well, there were no complications of the above procedure.

## 2022-01-06 NOTE — Telephone Encounter (Signed)
Attempted PA for emgality on CMM.  Was not able to process as one already till 05-02-2022.  I called pharmacy and she is able to get the emgality.  (They are out I believe at the pharmacy but will get ready for her).

## 2022-01-06 NOTE — Progress Notes (Signed)
Botox- 200 units x 1 vial Lot: [id number]  Expiration: 06/2023 NDC: 2820-8138-87  Bacteriostatic 0.9% Sodium Chloride- 76m total Lot: [id number] Expiration: 12/02/2022 NDC: [address]-85 Dx: [id number] B/B

## 2022-01-07 ENCOUNTER — Ambulatory Visit: Payer: Medicare HMO | Admitting: Neurology

## 2022-03-24 ENCOUNTER — Other Ambulatory Visit: Payer: Self-pay | Admitting: Neurology

## 2022-03-30 NOTE — Progress Notes (Deleted)
01/06/2022: stable, did not get emgality approved, will reorder topiramate she stopped taking it, prior to botox had daily migraines with botoc was previously reduced > 50% in migriane but still had 15 migraine days a month so tried to order [facility]gality for the remainder, [facility]  a PA we will check.will reorder emgality and topiramate '100mg'$ . She had tubal ligation and also several procedures cannot get pregnant. Still goes to [facility]. Daily migraines have not seen her in over a month. +a.   No orders of the defined types were placed in this encounter.    10/28/2020: Stable, is going to [facility] for extensive testing for white matter lesions possibly MS  07/22/2020: stable, still doing exceptionally well   Consent Form Botulism Toxin Injection For Chronic Migraine    Reviewed orally with patient, additionally signature is on file:  Botulism toxin has been approved by the Federal drug administration for treatment of chronic migraine. Botulism toxin does not cure chronic migraine and it may not be effective in some patients.  The administration of botulism toxin is accomplished by injecting a small amount of toxin into the muscles of the neck and head. Dosage must be titrated for each individual. Any benefits resulting from botulism toxin tend to wear off after 3 months with a repeat injection required if benefit is to be maintained. Injections are usually done every 3-4 months with maximum effect peak achieved by about 2 or 3 weeks. Botulism toxin is expensive and you should be sure of what costs you will incur resulting from the injection.  The side effects of botulism toxin use for chronic migraine may include:   -Transient, and usually mild, facial weakness with facial injections  -Transient, and usually mild, head or neck weakness with head/neck injections  -Reduction or loss of forehead facial animation due to forehead muscle weakness  -Eyelid drooping  -Dry eye  -Pain at the  site of injection or bruising at the site of injection  -Double vision  -Potential unknown long term risks  Contraindications: You should not have Botox if you are pregnant, nursing, allergic to albumin, have an infection, skin condition, or muscle weakness at the site of the injection, or have myasthenia gravis, Lambert-Eaton syndrome, or ALS.  It is also possible that as with any injection, there may be an allergic reaction or no effect from the medication. Reduced effectiveness after repeated injections is sometimes seen and rarely infection at the injection site may occur. All care will be taken to prevent these side effects. If therapy is given over a long time, atrophy and wasting in the muscle injected may occur. Occasionally the patient's become refractory to treatment because they develop antibodies to the toxin. In this event, therapy needs to be modified.  I have read the above information and consent to the administration of botulism toxin.    BOTOX PROCEDURE NOTE FOR MIGRAINE HEADACHE    Contraindications and precautions discussed with patient(above). Aseptic procedure was observed and patient tolerated procedure. Procedure performed by Dr. Georgia Dom  The condition has existed for more than 6 months, and pt does not have a diagnosis of ALS, Myasthenia Gravis or Lambert-Eaton Syndrome.  Risks and benefits of injections discussed and pt agrees to proceed with the procedure.  Written consent obtained  These injections are medically necessary. Pt  receives good benefits from these injections. These injections do not cause sedations or hallucinations which the oral therapies may cause.  Description of procedure:  The patient  was placed in a sitting position. The standard protocol was used for Botox as follows, with 5 units of Botox injected at each site:   -Procerus muscle, midline injection  -Corrugator muscle, bilateral injection  -Frontalis muscle, bilateral injection, with  2 sites each side, medial injection was performed in the upper one third of the frontalis muscle, in the region vertical from the medial inferior edge of the superior orbital rim. The lateral injection was again in the upper one third of the forehead vertically above the lateral limbus of the cornea, 1.5 cm lateral to the medial injection site.  -Temporalis muscle injection, 4 sites, bilaterally. The first injection was 3 cm above the tragus of the ear, second injection site was 1.5 cm to 3 cm up from the first injection site in line with the tragus of the ear. The third injection site was 1.5-3 cm forward between the first 2 injection sites. The fourth injection site was 1.5 cm posterior to the second injection site.   -Occipitalis muscle injection, 3 sites, bilaterally. The first injection was done one half way between the occipital protuberance and the tip of the mastoid process behind the ear. The second injection site was done lateral and superior to the first, 1 fingerbreadth from the first injection. The third injection site was 1 fingerbreadth superiorly and medially from the first injection site.  -Cervical paraspinal muscle injection, 2 sites, bilateral knee first injection site was 1 cm from the midline of the cervical spine, 3 cm inferior to the lower border of the occipital protuberance. The second injection site was 1.5 cm superiorly and laterally to the first injection site.  -Trapezius muscle injection was performed at 3 sites, bilaterally. The first injection site was in the upper trapezius muscle halfway between the inflection point of the neck, and the acromion. The second injection site was one half way between the acromion and the first injection site. The third injection was done between the first injection site and the inflection point of the neck.   Will return for repeat injection in 3 months.   200 units of Botox was used, 45 Botox not injected was wasted. The patient  tolerated the procedure well, there were no complications of the above procedure.

## 2022-03-31 ENCOUNTER — Ambulatory Visit: Payer: Medicare HMO | Admitting: Neurology

## 2022-03-31 ENCOUNTER — Encounter: Payer: Self-pay | Admitting: Neurology

## 2022-04-01 ENCOUNTER — Other Ambulatory Visit: Payer: Self-pay | Admitting: Adult Health

## 2022-04-01 ENCOUNTER — Other Ambulatory Visit: Payer: Self-pay | Admitting: Neurology

## 2022-04-28 ENCOUNTER — Emergency Department (HOSPITAL_COMMUNITY)
Admission: EM | Admit: 2022-04-28 | Discharge: 2022-04-29 | Disposition: A | Discharge status: Home / Self Care | Payer: Medicare HMO | Attending: Emergency Medicine | Admitting: Emergency Medicine

## 2022-04-28 ENCOUNTER — Encounter (HOSPITAL_COMMUNITY): Payer: Self-pay

## 2022-04-28 ENCOUNTER — Other Ambulatory Visit: Payer: Self-pay

## 2022-04-28 ENCOUNTER — Other Ambulatory Visit: Payer: Self-pay | Admitting: Neurology

## 2022-04-28 ENCOUNTER — Emergency Department (HOSPITAL_COMMUNITY): Payer: Medicare HMO

## 2022-04-28 DIAGNOSIS — M791 Myalgia, unspecified site: Secondary | ICD-10-CM | POA: Diagnosis not present

## 2022-04-28 DIAGNOSIS — Z5321 Procedure and treatment not carried out due to patient leaving prior to being seen by health care provider: Secondary | ICD-10-CM | POA: Diagnosis not present

## 2022-04-28 DIAGNOSIS — R079 Chest pain, unspecified: Secondary | ICD-10-CM | POA: Diagnosis not present

## 2022-04-28 DIAGNOSIS — R300 Dysuria: Secondary | ICD-10-CM | POA: Diagnosis not present

## 2022-04-28 DIAGNOSIS — R519 Headache, unspecified: Secondary | ICD-10-CM | POA: Insufficient documentation

## 2022-04-28 DIAGNOSIS — H538 Other visual disturbances: Secondary | ICD-10-CM | POA: Insufficient documentation

## 2022-04-28 LAB — URINALYSIS, ROUTINE W REFLEX MICROSCOPIC
Bilirubin Urine: NEGATIVE
Glucose, UA: NEGATIVE mg/dL
Ketones, ur: NEGATIVE mg/dL
Leukocytes,Ua: NEGATIVE
Nitrite: NEGATIVE
Protein, ur: NEGATIVE mg/dL
RBC / HPF: >50 RBC/hpf — ABNORMAL HIGH (ref 0–5)
Specific Gravity, Urine: 1.005 (ref 1.005–1.030)
pH: 6 (ref 5.0–8.0)

## 2022-04-28 LAB — CBC WITH DIFFERENTIAL/PLATELET
Abs Immature Granulocytes: 0.02 10*3/uL (ref 0.00–0.07)
Basophils Absolute: 0.1 10*3/uL (ref 0.0–0.1)
Basophils Relative: 2 %
Eosinophils Absolute: 0.2 10*3/uL (ref 0.0–0.5)
Eosinophils Relative: 2 %
HCT: 30.1 % — ABNORMAL LOW (ref 36.0–46.0)
Hemoglobin: 8.4 g/dL — ABNORMAL LOW (ref 12.0–15.0)
Immature Granulocytes: 0 %
Lymphocytes Relative: 27 %
Lymphs Abs: 1.8 10*3/uL (ref 0.7–4.0)
MCH: 19 pg — ABNORMAL LOW (ref 26.0–34.0)
MCHC: 27.9 g/dL — ABNORMAL LOW (ref 30.0–36.0)
MCV: 68.1 fL — ABNORMAL LOW (ref 80.0–100.0)
Monocytes Absolute: 0.5 10*3/uL (ref 0.1–1.0)
Monocytes Relative: 8 %
Neutro Abs: 4.1 10*3/uL (ref 1.7–7.7)
Neutrophils Relative %: 61 %
Platelets: 551 10*3/uL — ABNORMAL HIGH (ref 150–400)
RBC: 4.42 MIL/uL (ref 3.87–5.11)
RDW: 23.9 % — ABNORMAL HIGH (ref 11.5–15.5)
WBC: 6.6 10*3/uL (ref 4.0–10.5)
nRBC: 0 % (ref 0.0–0.2)

## 2022-04-28 LAB — BASIC METABOLIC PANEL
Anion gap: 10 (ref 5–15)
BUN: 6 mg/dL (ref 6–20)
CO2: 19 mmol/L — ABNORMAL LOW (ref 22–32)
Calcium: 8.5 mg/dL — ABNORMAL LOW (ref 8.9–10.3)
Chloride: 108 mmol/L (ref 98–111)
Creatinine, Ser: 0.56 mg/dL (ref 0.44–1.00)
GFR, Estimated: >60 mL/min (ref >60)
Glucose, Bld: 100 mg/dL — ABNORMAL HIGH (ref 70–99)
Potassium: 3.6 mmol/L (ref 3.5–5.1)
Sodium: 137 mmol/L (ref 135–145)

## 2022-04-28 LAB — RAPID URINE DRUG SCREEN, HOSP PERFORMED
Amphetamines: NOT DETECTED
Barbiturates: NOT DETECTED
Benzodiazepines: NOT DETECTED
Cocaine: NOT DETECTED
Opiates: NOT DETECTED
Tetrahydrocannabinol: NOT DETECTED

## 2022-04-28 LAB — I-STAT BETA HCG BLOOD, ED (MC, WL, AP ONLY): I-stat hCG, quantitative: <5 m[IU]/mL (ref <5)

## 2022-04-28 NOTE — ED Triage Notes (Signed)
I go to [facility]. I've had these headaches for years and years but today these headaches are getting worse and worse. I've been confused. It's been all of the above.

## 2022-04-28 NOTE — ED Provider Triage Note (Signed)
Emergency Medicine Provider Triage Evaluation Note  Cindy Hoffman , a 46 y.o. female  was evaluated in triage.  Pt complains of persistent headache. Cannot give clear timeline, but thinks it's been at least for several weeks. Previously saw [facility]. No change in her symptoms other than her daughter encouraged her to be seen. Scattered symptoms including intermittent dysuria, intermittent blurry vision, intermittent chest pain, intermittent fully body pain.   Review of Systems  Positive: Weakness, headache, as above Negative:   Physical Exam  BP 134/78 (BP Location: Left Arm)   Pulse 79   Temp 98.2 F (36.8 C) (Oral)   Resp 18   Wt 84.4 kg   SpO2 100%   BMI 31.43 kg/m  Gen:   Awake, no distress   Resp:  Normal effort  MSK:   Moves extremities without difficulty  Other:    Medical Decision Making  Medically screening exam initiated at 4:14 PM.  Appropriate orders placed.  Cindy Hoffman was informed that the remainder of the evaluation will be completed by another provider, this initial triage assessment does not replace that evaluation, and the importance of remaining in the ED until their evaluation is complete.  Workup initiated   Kateri Plummer, PA-C 04/28/22 1615

## 2022-04-29 NOTE — ED Notes (Signed)
Pt called x4 for a bed no answer.

## 2022-05-16 ENCOUNTER — Other Ambulatory Visit: Payer: Self-pay | Admitting: Neurology

## 2022-06-05 ENCOUNTER — Other Ambulatory Visit: Payer: Self-pay | Admitting: Neurology

## 2022-07-06 ENCOUNTER — Other Ambulatory Visit: Payer: Self-pay | Admitting: Neurology

## 2022-07-16 ENCOUNTER — Ambulatory Visit (HOSPITAL_COMMUNITY)
Admission: EM | Admit: 2022-07-16 | Discharge: 2022-07-16 | Disposition: A | Discharge status: Home / Self Care | Payer: Medicare HMO | Attending: Family | Admitting: Family

## 2022-07-16 DIAGNOSIS — R440 Auditory hallucinations: Secondary | ICD-10-CM | POA: Insufficient documentation

## 2022-07-16 DIAGNOSIS — R443 Hallucinations, unspecified: Secondary | ICD-10-CM

## 2022-07-16 NOTE — ED Triage Notes (Signed)
Pt presents to [facility] voluntarily accompanied by her father due to ongoing auditory hallucinations for the past 2 years. 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 Pt reports hearing voices in her head " I can hear a mind control company talking. Pt states the voices are not commanding. She reports the voices know information about her family such as a her siblings and daughters social security number, that she does not even know. Pt appears to be experiencing paranoia and delusional thinking. Pt denies SI/HI and AVH.

## 2022-07-16 NOTE — Discharge Instructions (Signed)

## 2022-07-16 NOTE — ED Provider Notes (Signed)
Behavioral Health Urgent Care Medical Screening Exam  Patient Name: Cindy Hoffman MRN: [id number] Date of Evaluation: 07/16/22 Chief Complaint:   Diagnosis:  Final diagnoses:  Hallucinations    History of Present illness: Cindy Hoffman is a 47 y.o. female. Patient presents voluntarily to [facility] behavioral health for walk-in assessment.  Patient is accompanied by her father, Cindy Hoffman, who does not remain present during assessment.   Patient is assessed, face-to-face, by nurse practitioner. She seated in assessment area, no acute distress. Consulted with provider, Dr.  [name], and chart reviewed on 07/16/2022. She  is alert and oriented, pleasant and cooperative during assessment.   Patient  presents with euthymic mood, congruent affect.  She states "2 years ago when I was drinking heavily I began hearing and seeing things."  She reports chronic intermittent auditory hallucinations ongoing times approximately 2 years.  She reports "I hear voices like 2 people talking."  On yesterday she experienced delusional thought content including voices belong to a mind control company.  She contacted the [employer] on yesterday to report mind control company.  She is insightful today.  She states "I know everything that I was hearing was not real."  She states "I feel like I have come back to myself now."  She cannot currently hear the voices however she can hear a buzzing sound.  She denies command hallucinations.  She denies visual hallucinations.  Zuma is not linked with outpatient psychiatry currently.  She states "I would like to talk to a psychiatrist and a therapist."  Current medications include amitriptyline prescribed for migraine headaches as well as prescription narcotics related to MS.  She reports she takes her medications as prescribed.  She has an MRI scheduled on 07/28/2022 ordered by primary care.  She denies history of inpatient psychiatric hospitalization.  Family mental health history  includes patient's mother who was diagnosed with bipolar disorder and later schizophrenia.  She  denies suicidal and homicidal ideations. Denies history of suicide attempts, denies history of self-harm.  Patient easily  contracts verbally for safety with this Probation officer.    Cindy Hoffman resides in [place] with her father, her 55 year old daughter, her brother, her nephew and her nephew's girlfriend.  She denies access to weapons.  She receives disability income.  She denies alcohol and substance use.  Patient is able to converse coherently with goal-directed thoughts. Patient endorses average sleep and appetite.  Patient offered support and encouragement.  Inpatient psychiatric treatment and continuous observation offered, patient declines.  She would prefer to follow-up with outpatient psychiatry.  She is willing to discuss antipsychotic medications but has reservations.  Patient's father, Cindy Hoffman, denies safety concerns.  Patient and father verbalized understanding of strict return precautions.   Patient and family are educated and verbalize understanding of mental health resources and other crisis services in the community. They are instructed to call 911 and present to the nearest emergency room should patient experience any suicidal/homicidal ideation, auditory/visual/hallucinations, or detrimental worsening of mental health condition.     [place] ED from 07/16/2022 in [facility] ED from 04/28/2022 in [facility] Emergency Department at [facility] ED from 11/10/2021 in [facility] Emergency Department at [place] No Risk No Risk No Risk       Psychiatric Specialty Exam  Presentation  General Appearance:Appropriate for Environment; Casual  Eye Contact:Good  Speech:Clear and Coherent; Normal Rate  Speech Volume:Normal  Handedness:Right   Mood and Affect  Mood: Euthymic  Affect: Congruent   Thought  Process  Thought Processes: Coherent; Goal Directed  Descriptions of Associations:Intact  Orientation:Full (Time, Place and Person)  Thought Content:Paranoid Ideation  Diagnosis of Schizophrenia or Schizoaffective disorder in past: No  Duration of Psychotic Symptoms: Greater than six months  Hallucinations:Auditory "Hearing voices of the same people"  Ideas of Reference:Paranoia  Suicidal Thoughts:No  Homicidal Thoughts:No   Sensorium  Memory: Immediate Good; Recent Good  Judgment: Good  Insight: Good   Executive Functions  Concentration: Good  Attention Span: Good  Recall: Good  Fund of Knowledge: Good  Language: Good   Psychomotor Activity  Psychomotor Activity: Normal   Assets  Assets: Communication Skills; Desire for Improvement; Housing; Catering manager; Social Support   Sleep  Sleep: Fair  Number of hours: No data recorded  Physical Exam: Physical Exam Vitals and nursing note reviewed.  Constitutional:      Appearance: Normal appearance. She is well-developed and normal weight.  HENT:     Head: Normocephalic and atraumatic.     Nose: Nose normal.  Cardiovascular:     Rate and Rhythm: Normal rate.  Pulmonary:     Effort: Pulmonary effort is normal.  Musculoskeletal:        General: Normal range of motion.     Cervical back: Normal range of motion.  Skin:    General: Skin is warm and dry.  Neurological:     Mental Status: She is alert and oriented to person, place, and time.  Psychiatric:        Attention and Perception: Attention normal. She perceives auditory hallucinations.        Mood and Affect: Mood and affect normal.        Speech: Speech normal.        Behavior: Behavior normal. Behavior is cooperative.        Thought Content: Thought content normal.        Cognition and Memory: Cognition and memory normal.    Review of Systems  Constitutional: Negative.   HENT: Negative.    Eyes: Negative.    Respiratory: Negative.    Cardiovascular: Negative.   Gastrointestinal: Negative.   Genitourinary: Negative.   Musculoskeletal:  Positive for back pain.  Skin: Negative.   Neurological: Negative.   Psychiatric/Behavioral:  Positive for hallucinations.    Blood pressure (!) 141/75, pulse 94, temperature 98 F (36.7 C), temperature source Oral, resp. rate 18, SpO2 100 %. There is no height or weight on file to calculate BMI.  Musculoskeletal: Strength & Muscle Tone: within normal limits Gait & Station: normal Patient leans: N/A   [place] Discharge Disposition for Follow up and Recommendations: Based on my evaluation the patient does not appear to have an emergency medical condition and can be discharged with resources and follow up care in outpatient services for Medication Management and Individual Therapy Follow-up with outpatient psychiatry, resources provided.   Lucky Rathke, FNP 07/16/2022, 7:00 PM

## 2022-07-17 ENCOUNTER — Observation Stay (HOSPITAL_COMMUNITY): Payer: Medicare HMO

## 2022-07-17 ENCOUNTER — Observation Stay (HOSPITAL_COMMUNITY)
Admission: EM | Admit: 2022-07-17 | Discharge: 2022-07-18 | Disposition: A | Discharge status: Home / Self Care | Payer: Medicare HMO | Attending: Internal Medicine | Admitting: Internal Medicine

## 2022-07-17 ENCOUNTER — Encounter (HOSPITAL_COMMUNITY): Payer: Self-pay | Admitting: Emergency Medicine

## 2022-07-17 ENCOUNTER — Other Ambulatory Visit: Payer: Self-pay

## 2022-07-17 DIAGNOSIS — Z79899 Other long term (current) drug therapy: Secondary | ICD-10-CM | POA: Diagnosis not present

## 2022-07-17 DIAGNOSIS — E872 Acidosis, unspecified: Secondary | ICD-10-CM | POA: Diagnosis not present

## 2022-07-17 DIAGNOSIS — J45909 Unspecified asthma, uncomplicated: Secondary | ICD-10-CM | POA: Diagnosis present

## 2022-07-17 DIAGNOSIS — R519 Headache, unspecified: Secondary | ICD-10-CM | POA: Diagnosis present

## 2022-07-17 DIAGNOSIS — F419 Anxiety disorder, unspecified: Secondary | ICD-10-CM

## 2022-07-17 DIAGNOSIS — J45998 Other asthma: Secondary | ICD-10-CM | POA: Diagnosis not present

## 2022-07-17 DIAGNOSIS — G894 Chronic pain syndrome: Secondary | ICD-10-CM

## 2022-07-17 DIAGNOSIS — D649 Anemia, unspecified: Principal | ICD-10-CM | POA: Insufficient documentation

## 2022-07-17 DIAGNOSIS — F17210 Nicotine dependence, cigarettes, uncomplicated: Secondary | ICD-10-CM | POA: Insufficient documentation

## 2022-07-17 LAB — POC OCCULT BLOOD, ED: Fecal Occult Bld: NEGATIVE

## 2022-07-17 LAB — CBC WITH DIFFERENTIAL/PLATELET
Abs Immature Granulocytes: 0 10*3/uL (ref 0.00–0.07)
Basophils Absolute: 0.1 10*3/uL (ref 0.0–0.1)
Basophils Relative: 1 %
Eosinophils Absolute: 0.1 10*3/uL (ref 0.0–0.5)
Eosinophils Relative: 2 %
HCT: 21.5 % — ABNORMAL LOW (ref 36.0–46.0)
Hemoglobin: 6.1 g/dL — CL (ref 12.0–15.0)
Lymphocytes Relative: 27 %
Lymphs Abs: 1.4 10*3/uL (ref 0.7–4.0)
MCH: 18.3 pg — ABNORMAL LOW (ref 26.0–34.0)
MCHC: 28.4 g/dL — ABNORMAL LOW (ref 30.0–36.0)
MCV: 64.4 fL — ABNORMAL LOW (ref 80.0–100.0)
Monocytes Absolute: 0.2 10*3/uL (ref 0.1–1.0)
Monocytes Relative: 3 %
Neutro Abs: 3.4 10*3/uL (ref 1.7–7.7)
Neutrophils Relative %: 67 %
Platelets: 359 10*3/uL (ref 150–400)
RBC: 3.34 MIL/uL — ABNORMAL LOW (ref 3.87–5.11)
RDW: 21.5 % — ABNORMAL HIGH (ref 11.5–15.5)
WBC: 5.1 10*3/uL (ref 4.0–10.5)
nRBC: 0 % (ref 0.0–0.2)
nRBC: 0 /100 WBC

## 2022-07-17 LAB — FOLATE: Folate: 7.6 ng/mL (ref >5.9)

## 2022-07-17 LAB — IRON AND TIBC
Iron: 15 ug/dL — ABNORMAL LOW (ref 28–170)
Saturation Ratios: 3 % — ABNORMAL LOW (ref 10.4–31.8)
TIBC: 543 ug/dL — ABNORMAL HIGH (ref 250–450)
UIBC: 528 ug/dL

## 2022-07-17 LAB — RETICULOCYTES
Immature Retic Fract: 13.5 % (ref 2.3–15.9)
RBC.: 4.22 MIL/uL (ref 3.87–5.11)
Retic Count, Absolute: 29.5 10*3/uL (ref 19.0–186.0)
Retic Ct Pct: 0.7 % (ref 0.4–3.1)

## 2022-07-17 LAB — HEPATIC FUNCTION PANEL
ALT: 14 U/L (ref 0–44)
AST: 18 U/L (ref 15–41)
Albumin: 3.2 g/dL — ABNORMAL LOW (ref 3.5–5.0)
Alkaline Phosphatase: 127 U/L — ABNORMAL HIGH (ref 38–126)
Bilirubin, Direct: <0.1 mg/dL (ref 0.0–0.2)
Total Bilirubin: 0.3 mg/dL (ref 0.3–1.2)
Total Protein: 6.5 g/dL (ref 6.5–8.1)

## 2022-07-17 LAB — BASIC METABOLIC PANEL
Anion gap: 7 (ref 5–15)
BUN: 8 mg/dL (ref 6–20)
CO2: 17 mmol/L — ABNORMAL LOW (ref 22–32)
Calcium: 7.7 mg/dL — ABNORMAL LOW (ref 8.9–10.3)
Chloride: 114 mmol/L — ABNORMAL HIGH (ref 98–111)
Creatinine, Ser: 0.49 mg/dL (ref 0.44–1.00)
GFR, Estimated: >60 mL/min (ref >60)
Glucose, Bld: 100 mg/dL — ABNORMAL HIGH (ref 70–99)
Potassium: 3.6 mmol/L (ref 3.5–5.1)
Sodium: 138 mmol/L (ref 135–145)

## 2022-07-17 LAB — PREPARE RBC (CROSSMATCH)

## 2022-07-17 LAB — FERRITIN: Ferritin: 2 ng/mL — ABNORMAL LOW (ref 11–307)

## 2022-07-17 LAB — HIV ANTIBODY (ROUTINE TESTING W REFLEX): HIV Screen 4th Generation wRfx: NONREACTIVE

## 2022-07-17 LAB — VITAMIN B12: Vitamin B-12: 237 pg/mL (ref 180–914)

## 2022-07-17 MED ORDER — LACTATED RINGERS IV SOLN: INTRAVENOUS | Status: AC

## 2022-07-17 MED ORDER — DEXAMETHASONE SODIUM PHOSPHATE 4 MG/ML IJ SOLN
4.0000 mg | Freq: Once | INTRAMUSCULAR | Status: AC
  Administered 2022-07-17: 4 mg via INTRAVENOUS
  Filled 2022-07-17: qty 1

## 2022-07-17 MED ORDER — SODIUM CHLORIDE 0.9% IV SOLUTION: Freq: Once | INTRAVENOUS | Status: AC

## 2022-07-17 MED ORDER — METOCLOPRAMIDE HCL 5 MG/ML IJ SOLN
10.0000 mg | Freq: Once | INTRAMUSCULAR | Status: AC
  Administered 2022-07-17: 10 mg via INTRAVENOUS
  Filled 2022-07-17: qty 2

## 2022-07-17 MED ORDER — SODIUM CHLORIDE 0.9 % IV BOLUS
1000.0000 mL | Freq: Once | INTRAVENOUS | Status: AC
  Administered 2022-07-17: 1000 mL via INTRAVENOUS

## 2022-07-17 MED ORDER — GADOBUTROL 1 MMOL/ML IV SOLN
8.0000 mL | Freq: Once | INTRAVENOUS | Status: AC | PRN
  Administered 2022-07-17: 8 mL via INTRAVENOUS

## 2022-07-17 MED ORDER — ACETAMINOPHEN 325 MG PO TABS: 650.0000 mg | ORAL_TABLET | Freq: Four times a day (QID) | ORAL | Status: DC | PRN

## 2022-07-17 MED ORDER — KETOROLAC TROMETHAMINE 15 MG/ML IJ SOLN
15.0000 mg | Freq: Once | INTRAMUSCULAR | Status: AC
  Administered 2022-07-17: 15 mg via INTRAVENOUS
  Filled 2022-07-17: qty 1

## 2022-07-17 MED ORDER — ACETAMINOPHEN 650 MG RE SUPP: 650.0000 mg | Freq: Four times a day (QID) | RECTAL | Status: DC | PRN

## 2022-07-17 NOTE — H&P (Signed)
History and Physical    Cindy Hoffman [id number] DOB: 1975-11-28 DOA: 07/17/2022  PCP: Berkley Harvey, NP  Patient coming from: Home  Chief Complaint: Headaches  HPI: Cindy Hoffman is a 47 y.o. female with medical history significant of chronic migraine followed by neurology and receives botulism toxin injections, MS, occipital neuralgia, anxiety, arthritis, asthma, autoimmune encephalitis, chronic pain syndrome, GERD, OSA presented to the ED with headaches, blurry vision, and fatigue.  Afebrile and not hypertensive.  Labs showing no leukocytosis, hemoglobin 6.1 (previously 8-9 range), MCV 64.4, bicarb 17, anion gap 7, glucose 100, calcium 7.7 (no albumin level checked), anemia panel pending, FOBT negative Patient received IV Decadron 4 mg, Toradol 15 mg, Reglan 10 mg, and 1 L normal saline bolus.  2 units PRBCs ordered.  Patient refused CT head.  ED PA discussed the case with Dr. Leonel Ramsay in neurology who felt that the anemia could be causing worsening headaches.  He recommended MRI brain and C-spine with and without contrast for further evaluation.  If MRI positive, then formally consult neurology.   Patient reports history of chronic daily headaches and thinks her headaches have gotten worse over time.  She is also endorsing blurry vision.  States her head hurts all over and "all my veins are hurting."  She also reports history of gastric bypass surgery several years ago and iron deficiency anemia.  Stopped taking iron supplement a year ago.  States she has never received blood transfusions in the past.  Denies hematemesis, hematochezia, melena, hematuria, or menstrual bleeding.  States she stopped having menstrual periods several years ago.  Denies shortness of breath or chest pain.  No other complaints.   Review of Systems:  Review of Systems  All other systems reviewed and are negative.   Past Medical History:  Diagnosis Date   Anxiety    Arthritis    knees   Asthma     Autoimmune encephalitis    Chronic pain syndrome    Complication of anesthesia    "STATES WAKES UP AND CANT BREATHE AND NEEDS BREATHING TREATMENT  IN PACU"   Constipation due to opioid therapy    Dysrhythmia    palpitations- "from thyroid"   Fibromyalgia    Fracture of right foot    Gastric ulcer 2018   GERD (gastroesophageal reflux disease)    History of kidney stones    "inbedded"   Migraine    Mirgraine-   Multiple sclerosis ([place])    Myofacial muscle pain    Obesity    Occipital neuralgia 2016   OSA (obstructive sleep apnea)    Has CPAP   Sleep apnea     NO CPAP--could not tolerates mask   Thyroid disease    Viral respiratory infection 06/26/2013   ? influenza    Past Surgical History:  Procedure Laterality Date   COLONOSCOPY      x2   ENDOMETRIAL ABLATION     ENDOMETRIAL ABLATION  07/17/2017   GALLBLADDER SURGERY  06/2016   GASTRIC ROUX-EN-Y  05/2016   HIP ARTHROSCOPY Right 05/10/2019   Procedure: Right hip arthroscopy with labrum repair and  femoroplasty;  Surgeon: Nicholes Stairs, MD;  Location: [facility];  Service: Orthopedics;  Laterality: Right;  2.5 hrs   NASAL SEPTUM SURGERY     THYROID LOBECTOMY Right 07/19/2013   Procedure: THYROID LOBECTOMY;  Surgeon: Harl Bowie, MD;  Location: [facility];  Service: General;  Laterality: Right;   TUBAL LIGATION     WISDOM  TOOTH EXTRACTION       reports that she has been smoking cigarettes. She has been smoking an average of 0.50 packs per day. She has never used smokeless tobacco. She reports current alcohol use of about 2.0 standard drinks of alcohol per week. She reports that she does not use drugs.  Allergies  Allergen Reactions   Beta Adrenergic Blockers Anaphylaxis   Nsaids     Other reaction(s): Other Contraindicated due to bariatric surgery   Tolmetin Other (See Comments)    Contraindicated due to bariatric surgery Contraindicated due to bariatric surgery    Family History  Problem Relation Age of  Onset   Hypertension Mother    Hypertension Father    Prostate cancer Father    Diabetes Sister    Cancer Paternal Grandmother        breast    Prior to Admission medications   Medication Sig Start Date End Date Taking? Authorizing Provider  albuterol (PROVENTIL HFA;VENTOLIN HFA) 108 (90 BASE) MCG/ACT inhaler Inhale 2 puffs into the lungs every 6 (six) hours as needed for wheezing or shortness of breath.     [provider]  albuterol (PROVENTIL) (2.5 MG/3ML) 0.083% nebulizer solution Take 2.5 mg by nebulization every 6 (six) hours as needed for wheezing or shortness of breath.    [provider]  amitriptyline (ELAVIL) 25 MG tablet TAKE 4 TABLETS (100 MG TOTAL) BY MOUTH AT BEDTIME. MUST BE SEEN FOR FURTHER REFILLS. 04/01/22   Melvenia Beam, MD  cyanocobalamin (,VITAMIN B-12,) 1000 MCG/ML injection Inject 1,000 mcg into the muscle every 28 (twenty-eight) days. 03/31/21   [provider]  escitalopram (LEXAPRO) 20 MG tablet Take 20 mg by mouth at bedtime. 04/28/19   [provider]  famotidine (PEPCID) 40 MG tablet Take 40 mg by mouth daily. 12/12/20   [provider]  ferrous sulfate 325 (65 FE) MG tablet Take 325 mg by mouth daily.    [provider]  Galcanezumab-gnlm (EMGALITY) 120 MG/ML SOAJ Inject 120 mg into the skin every 30 (thirty) days. 01/06/22   Melvenia Beam, MD  hydrOXYzine (ATARAX/VISTARIL) 25 MG tablet Take 25 mg by mouth 2 (two) times daily as needed for anxiety. 12/29/18   [provider]  Lactulose 20 GM/30ML SOLN Take 30 mLs (20 g total) by mouth every 8 (eight) hours as needed (Constipation/bloating). 11/10/21   Daleen Bo, MD  montelukast (SINGULAIR) 10 MG tablet Take 10 mg by mouth at bedtime. 03/06/21   [provider]  MOVANTIK 25 MG TABS tablet Take 25 mg by mouth at bedtime. 04/12/19   [provider]  ondansetron (ZOFRAN ODT) 4 MG disintegrating tablet Take 1 tablet (4 mg total) by  mouth every 8 (eight) hours as needed for nausea or vomiting. 01/06/21   Isla Pence, MD  Oxycodone HCl 10 MG TABS Take 10 mg by mouth 4 (four) times daily as needed (for pain). 11/05/21   [provider]  pantoprazole (PROTONIX) 40 MG tablet Take 40 mg by mouth daily. 04/29/21   [provider]  tiZANidine (ZANAFLEX) 4 MG tablet TAKE 1 OR 2 TABLETS BY MOUTH EVERY 8 HOURS AS NEEDED FOR MUSCLE SPASM (NEED OFFICE VISIT) 07/07/22   Melvenia Beam, MD  topiramate (TOPAMAX) 100 MG tablet Take 1 tablet (100 mg total) by mouth at bedtime. 01/06/22   Melvenia Beam, MD  XTAMPZA ER 13.5 MG C12A Take 1 capsule by mouth every 12 (twelve) hours. 11/05/21   [provider]    Physical Exam: Vitals:   07/17/22 1802 07/17/22 1803 07/17/22 1815 07/17/22 1830  BP: 104/67  110/61 106/67  Resp:  (!) 21 16 (!) 24  Temp:      TempSrc:      Weight:      Height:        Physical Exam Vitals reviewed.  Constitutional:      General: She is not in acute distress. HENT:     Head: Normocephalic and atraumatic.  Eyes:     Extraocular Movements: Extraocular movements intact.  Cardiovascular:     Rate and Rhythm: Normal rate and regular rhythm.     Pulses: Normal pulses.  Pulmonary:     Effort: Pulmonary effort is normal. No respiratory distress.     Breath sounds: Normal breath sounds. No wheezing or rales.  Abdominal:     General: Bowel sounds are normal. There is no distension.     Palpations: Abdomen is soft.     Tenderness: There is no abdominal tenderness.  Musculoskeletal:     Cervical back: Normal range of motion. No rigidity.     Right lower leg: No edema.     Left lower leg: No edema.  Skin:    General: Skin is warm and dry.  Neurological:     General: No focal deficit present.     Mental Status: She is alert and oriented to person, place, and time.     Cranial Nerves: No cranial nerve deficit.     Sensory: No sensory deficit.     Motor: No weakness.     Labs on  Admission: I have personally reviewed following labs and imaging studies  CBC: Recent Labs  Lab 07/17/22 1841  WBC 5.1  NEUTROABS 3.4  HGB 6.1*  HCT 21.5*  MCV 64.4*  PLT [id number]   Basic Metabolic Panel: Recent Labs  Lab 07/17/22 1841  NA 138  K 3.6  CL 114*  CO2 17*  GLUCOSE 100*  BUN 8  CREATININE 0.49  CALCIUM 7.7*   GFR: Estimated Creatinine Clearance: 90.9 mL/min (by C-G formula based on SCr of 0.49 mg/dL). Liver Function Tests: No results for input(s): "AST", "ALT", "ALKPHOS", "BILITOT", "PROT", "ALBUMIN" in the last 168 hours. No results for input(s): "LIPASE", "AMYLASE" in the last 168 hours. No results for input(s): "AMMONIA" in the last 168 hours. Coagulation Profile: No results for input(s): "INR", "PROTIME" in the last 168 hours. Cardiac Enzymes: No results for input(s): "CKTOTAL", "CKMB", "CKMBINDEX", "TROPONINI" in the last 168 hours. BNP (last 3 results) No results for input(s): "PROBNP" in the last 8760 hours. HbA1C: No results for input(s): "HGBA1C" in the last 72 hours. CBG: No results for input(s): "GLUCAP" in the last 168 hours. Lipid Profile: No results for input(s): "CHOL", "HDL", "LDLCALC", "TRIG", "CHOLHDL", "LDLDIRECT" in the last 72 hours. Thyroid Function Tests: No results for input(s): "TSH", "T4TOTAL", "FREET4", "T3FREE", "THYROIDAB" in the last 72 hours. Anemia Panel: No results for input(s): "VITAMINB12", "FOLATE", "FERRITIN", "TIBC", "IRON", "RETICCTPCT" in the last 72 hours. Urine analysis:    Component Value Date/Time   COLORURINE YELLOW 04/28/2022 1800   APPEARANCEUR CLEAR 04/28/2022 1800   LABSPEC 1.005 04/28/2022 1800   PHURINE 6.0 04/28/2022 1800   GLUCOSEU NEGATIVE 04/28/2022 1800   HGBUR LARGE (A) 04/28/2022 1800   BILIRUBINUR NEGATIVE 04/28/2022 1800   KETONESUR NEGATIVE 04/28/2022 1800   PROTEINUR NEGATIVE 04/28/2022 1800   UROBILINOGEN 0.2 01/04/2015 1415   NITRITE NEGATIVE 04/28/2022 1800   LEUKOCYTESUR NEGATIVE  04/28/2022 1800    Radiological Exams on Admission: No results found.  EKG: Independently reviewed.Sinus rhythm, no significant change compared to prior tracings.  Assessment and Plan  Symptomatic anemia Patient reports history of iron deficiency anemia in the setting of gastric bypass surgery several years ago.  She is no longer taking an iron supplement at home.  Found to be anemic with hemoglobin 6.1 and MCV 64.4.  Hemoglobin previously in the 8-9 range.  She is endorsing fatigue.  Not endorsing any symptoms of GI bleed and FOBT negative.  Not endorsing any symptoms of genitourinary bleeding.  Hemodynamically stable.  She is receiving 2 units PRBCs which were ordered in the ED.  Of note, chart mentions blood product refusal but spoke to the patient who states she has never refused blood products in the past and she has given consent for blood transfusions at this time.  Follow-up posttransfusion H&H.  Anemia panel pending.  Chronic migraine headaches History of MS Patient is endorsing chronic daily generalized headaches which have gotten worse over time.  Also endorsing blurry vision. No focal neurodeficit.  No fever, leukocytosis, or meningeal signs.  Patient refused CT head.  ED PA had spoken to Dr. Leonel Ramsay with neurology who felt that anemia could be causing worsening headaches.  He recommended obtaining MRI brain and C-spine with and without contrast for further evaluation.  If MRI positive, then formally consult neurology.  Patient has agreed to have MRI scans done.  She was given headache cocktail in the ED.  Continue home migraine medications after pharmacy med rec is done.  Normal anion gap metabolic acidosis Not on diuretics and not endorsing diarrhea.  Bicarb 17, anion gap 7.  Continue IV fluid hydration and monitor labs.  Replace bicarb if needed.  ?Hypocalcemia Calcium 7.7, check albumin level to calculate corrected calcium level.  Replace as needed.  Asthma: Stable, no  signs of acute exacerbation. Anxiety Chronic pain syndrome GERD Pharmacy med rec pending.  DVT prophylaxis: SCDs until MRIs done Code Status: Full Code (discussed with the patient) Family Communication: No family available at this time. Level of care: Med-Surg Admission status: It is my clinical opinion that referral for OBSERVATION is reasonable and necessary in this patient based on the above information provided. The aforementioned taken together are felt to place the patient at high risk for further clinical deterioration. However, it is anticipated that the patient may be medically stable for discharge from the hospital within 24 to 48 hours.   Shela Leff MD [facility]  If 7PM-7AM, please contact night-coverage [email or web address]  07/17/2022, 9:35 PM

## 2022-07-17 NOTE — ED Notes (Signed)
Pt gone to MRI....going upstairs afterwards.Marland KitchenMarland Kitchen

## 2022-07-17 NOTE — ED Notes (Addendum)
ED TO INPATIENT HANDOFF REPORT  ED Nurse Name and Phone #: Minha Fulco/ [phone]  S Name/Age/Gender Cindy Hoffman 47 y.o. female Room/Bed: 007C/007C  Code Status   Code Status: Full Code  Home/SNF/Other Home Patient oriented to: self, place, time, and situation Is this baseline? Yes   Triage Complete: Triage complete  Chief Complaint Symptomatic anemia [D64.9]  Triage Note Pt c/o off and on headache for months-years. Pt also c/o back and bilateral arm/leg pain that started last night. Endorses blurry vision and nausea. Hx of ms.    Allergies Allergies  Allergen Reactions   Beta Adrenergic Blockers Anaphylaxis   Nsaids     Other reaction(s): Other Contraindicated due to bariatric surgery   Tolmetin Other (See Comments)    Contraindicated due to bariatric surgery Contraindicated due to bariatric surgery    Level of Care/Admitting Diagnosis ED Disposition     ED Disposition  Admit   Condition  --   [place]: [facility] [100100]  Level of Care: Med-Surg [16]  May place patient in observation at [facility] or [facility] if equivalent level of care is available:: Yes  Covid Evaluation: Asymptomatic - no recent exposure (last 10 days) testing not required  Diagnosis: Symptomatic anemia [id number]  Admitting Physician: Shela Leff [id number]  Attending Physician: Shela Leff [id number]          B Medical/Surgery History Past Medical History:  Diagnosis Date   Anxiety    Arthritis    knees   Asthma    Autoimmune encephalitis    Chronic pain syndrome    Complication of anesthesia    "STATES WAKES UP AND CANT BREATHE AND NEEDS BREATHING TREATMENT  IN PACU"   Constipation due to opioid therapy    Dysrhythmia    palpitations- "from thyroid"   Fibromyalgia    Fracture of right foot    Gastric ulcer 2018   GERD (gastroesophageal reflux disease)    History of kidney stones    "inbedded"   Migraine    Mirgraine-    Multiple sclerosis ([place])    Myofacial muscle pain    Obesity    Occipital neuralgia 2016   OSA (obstructive sleep apnea)    Has CPAP   Sleep apnea     NO CPAP--could not tolerates mask   Thyroid disease    Viral respiratory infection 06/26/2013   ? influenza   Past Surgical History:  Procedure Laterality Date   COLONOSCOPY      x2   ENDOMETRIAL ABLATION     ENDOMETRIAL ABLATION  07/17/2017   GALLBLADDER SURGERY  06/2016   GASTRIC ROUX-EN-Y  05/2016   HIP ARTHROSCOPY Right 05/10/2019   Procedure: Right hip arthroscopy with labrum repair and  femoroplasty;  Surgeon: Nicholes Stairs, MD;  Location: [facility];  Service: Orthopedics;  Laterality: Right;  2.5 hrs   NASAL SEPTUM SURGERY     THYROID LOBECTOMY Right 07/19/2013   Procedure: THYROID LOBECTOMY;  Surgeon: Harl Bowie, MD;  Location: [facility];  Service: General;  Laterality: Right;   TUBAL LIGATION     WISDOM TOOTH EXTRACTION       A IV Location/Drains/Wounds Patient Lines/Drains/Airways Status     Active Line/Drains/Airways     Name Placement date Placement time Site Days   Peripheral IV 07/17/22 20 G Posterior;Right Hand 07/17/22  1851  Hand  less than 1            Intake/Output Last 24 hours No  intake or output data in the 24 hours ending 07/17/22 2222  Labs/Imaging Results for orders placed or performed during the hospital encounter of 07/17/22 (from the past 48 hour(s))  Basic metabolic panel     Status: Abnormal   Collection Time: 07/17/22  6:41 PM  Result Value Ref Range   Sodium 138 135 - 145 mmol/L   Potassium 3.6 3.5 - 5.1 mmol/L   Chloride 114 (H) 98 - 111 mmol/L   CO2 17 (L) 22 - 32 mmol/L   Glucose, Bld 100 (H) 70 - 99 mg/dL    Comment: Glucose reference range applies only to samples taken after fasting for at least 8 hours.   BUN 8 6 - 20 mg/dL   Creatinine, Ser 0.49 0.44 - 1.00 mg/dL   Calcium 7.7 (L) 8.9 - 10.3 mg/dL   GFR, Estimated >60 >60 mL/min    Comment: (NOTE) Calculated  using the CKD-EPI Creatinine Equation (2021)    Anion gap 7 5 - 15    Comment: Performed at [facility] [address] [place], [place] [address]  CBC with Differential     Status: Abnormal   Collection Time: 07/17/22  6:41 PM  Result Value Ref Range   WBC 5.1 4.0 - 10.5 K/uL   RBC 3.34 (L) 3.87 - 5.11 MIL/uL   Hemoglobin 6.1 (LL) 12.0 - 15.0 g/dL    Comment: REPEATED TO VERIFY Reticulocyte Hemoglobin testing may be clinically indicated, consider ordering this additional test [id number] THIS CRITICAL RESULT HAS VERIFIED AND BEEN CALLED TO Eschol Auxier,RN BY ZELDA BEECH ON 03 16 2024 AT 1928, AND HAS BEEN READ BACK.     HCT 21.5 (L) 36.0 - 46.0 %   MCV 64.4 (L) 80.0 - 100.0 fL   MCH 18.3 (L) 26.0 - 34.0 pg   MCHC 28.4 (L) 30.0 - 36.0 g/dL   RDW 21.5 (H) 11.5 - 15.5 %   Platelets 359 150 - 400 K/uL   nRBC 0.0 0.0 - 0.2 %   Neutrophils Relative % 67 %   Neutro Abs 3.4 1.7 - 7.7 K/uL   Lymphocytes Relative 27 %   Lymphs Abs 1.4 0.7 - 4.0 K/uL   Monocytes Relative 3 %   Monocytes Absolute 0.2 0.1 - 1.0 K/uL   Eosinophils Relative 2 %   Eosinophils Absolute 0.1 0.0 - 0.5 K/uL   Basophils Relative 1 %   Basophils Absolute 0.1 0.0 - 0.1 K/uL   nRBC 0 0 /100 WBC   Abs Immature Granulocytes 0.00 0.00 - 0.07 K/uL   Schistocytes PRESENT    Polychromasia PRESENT    Target Cells PRESENT    Ovalocytes PRESENT     Comment: Performed at [facility], [place] [address] [place], [place] [address]  POC occult blood, ED Provider will collect     Status: None   Collection Time: 07/17/22  8:04 PM  Result Value Ref Range   Fecal Occult Bld NEGATIVE NEGATIVE  Type and screen [facility]     Status: None (Preliminary result)   Collection Time: 07/17/22  9:20 PM  Result Value Ref Range   ABO/RH(D) O POS    Antibody Screen NEG    Sample Expiration      07/20/2022,2359 Performed at [facility], [place] [address], [place], [place] [address]    Unit Number  [id number]    Blood Component Type RBC LR PHER2    Unit division 00    Status  of Unit ALLOCATED    Transfusion Status OK TO TRANSFUSE    Crossmatch Result Compatible    Unit Number [id number]    Blood Component Type RBC LR PHER1    Unit division 00    Status of Unit ALLOCATED    Transfusion Status OK TO TRANSFUSE    Crossmatch Result Compatible   Reticulocytes     Status: None   Collection Time: 07/17/22  9:20 PM  Result Value Ref Range   Retic Ct Pct 0.7 0.4 - 3.1 %   RBC. 4.22 3.87 - 5.11 MIL/uL   Retic Count, Absolute 29.5 19.0 - 186.0 K/uL   Immature Retic Fract 13.5 2.3 - 15.9 %    Comment: Performed at [facility] [address], [place], [place] [address]  Prepare RBC (crossmatch)     Status: None   Collection Time: 07/17/22  9:20 PM  Result Value Ref Range   Order Confirmation      ORDER PROCESSED BY BLOOD BANK Performed at [facility], [place] [address], [place], [place] [address]   Hepatic function panel     Status: Abnormal   Collection Time: 07/17/22  9:20 PM  Result Value Ref Range   Total Protein 6.5 6.5 - 8.1 g/dL   Albumin 3.2 (L) 3.5 - 5.0 g/dL   AST 18 15 - 41 U/L   ALT 14 0 - 44 U/L   Alkaline Phosphatase 127 (H) 38 - 126 U/L   Total Bilirubin 0.3 0.3 - 1.2 mg/dL   Bilirubin, Direct <0.1 0.0 - 0.2 mg/dL   Indirect Bilirubin NOT CALCULATED 0.3 - 0.9 mg/dL    Comment: Performed at [facility] [address], [place], [place] [address]   No results found.  Pending Labs Unresulted Labs (From admission, onward)     Start     Ordered   07/18/22 0500  CBC  Tomorrow morning,   R        07/17/22 2124   07/18/22 [id number]  Basic metabolic panel  Tomorrow morning,   R        07/17/22 2153   07/17/22 2124  HIV Antibody (routine testing w rflx)  (HIV Antibody (Routine testing w reflex) panel)  Once,   R        07/17/22 2124   07/17/22 2120  Vitamin B12  Once,   AD        07/17/22 2120   07/17/22 1934  Folate  (Anemia Panel (PNL))  Once,    URGENT        07/17/22 1934   07/17/22 1934  Iron and TIBC  (Anemia Panel (PNL))  Once,   URGENT        07/17/22 1934   07/17/22 1934  Ferritin  (Anemia Panel (PNL))  Once,   URGENT        07/17/22 1934            Vitals/Pain Today's Vitals   07/17/22 1803 07/17/22 1815 07/17/22 1830 07/17/22 1831  BP:  110/61 106/67   Resp: (!) 21 16 (!) 24   Temp:      TempSrc:      Weight:      Height:      PainSc:    5     Isolation Precautions No active isolations  Medications Medications  0.9 %  sodium chloride infusion (Manually program via Guardrails IV Fluids) (has no administration in time range)  acetaminophen (TYLENOL) tablet 650 mg (  has no administration in time range)    Or  acetaminophen (TYLENOL) suppository 650 mg (has no administration in time range)  lactated ringers infusion (has no administration in time range)  sodium chloride 0.9 % bolus 1,000 mL (1,000 mLs Intravenous New Bag/Given 07/17/22 1902)  ketorolac (TORADOL) 15 MG/ML injection 15 mg (15 mg Intravenous Given 07/17/22 1855)  metoCLOPramide (REGLAN) injection 10 mg (10 mg Intravenous Given 07/17/22 1856)  dexamethasone (DECADRON) injection 4 mg (4 mg Intravenous Given 07/17/22 1858)    Mobility walks     Focused Assessments     R Recommendations: See Admitting Provider Note  Report given to:   Additional Notes: Pt initially came in for a H/A and she got toradol, reglan, and decadron for her H/A. Her hgb came back to be 6.1 so she's being admitted for symptomatic anemia. She has 2 units ready in the blood bank. She has 20 G in the R hand. She's A&Ox4 and ambulatory.

## 2022-07-17 NOTE — ED Provider Notes (Signed)
[facility] Provider Note   CSN: [id number] Arrival date & time: 07/17/22  1726     History  Chief Complaint  Patient presents with   Headache    Cindy Hoffman is a 47 y.o. female.  MS, asthma, fibromyalgia, chronic pain syndrome, arthritis who presents to the ED for evaluation of a headache and fatigue.  States the headaches have been intermittent and occur every day.  This has been happening for "a few months."  Takes amitriptyline, oxycodone, Xtampza and tizanidine for her headaches.  Has also had Botox injections in the past.  States that headaches last a few minutes at a time and then resolve spontaneously.  No recent change.  They change locations but typically occur behind 1 eye or the other.  States she follows with neurology for her headaches.  Has an appointment next month.  Comes in today requesting MRI of her brain.  Declining CT of the head stating that they never show anything.  Denies numbness, tingling, vision changes.  States that she intermittently has difficulty walking due to fatigue.  States she occasionally gets lightheaded.  Denies neck rigidity or fevers.  Also endorses some nausea without emesis.  Reports history of anemia.  Does not take anything for this including iron supplements.  Denies heavy menstrual bleeding or dark and tarry stools, chest pain, shortness of breath, cough, abdominal pain.  She states she does not get menstrual cycles.   Headache      Home Medications Prior to Admission medications   Medication Sig Start Date End Date Taking? Authorizing Provider  albuterol (PROVENTIL HFA;VENTOLIN HFA) 108 (90 BASE) MCG/ACT inhaler Inhale 2 puffs into the lungs every 6 (six) hours as needed for wheezing or shortness of breath.     [provider]  albuterol (PROVENTIL) (2.5 MG/3ML) 0.083% nebulizer solution Take 2.5 mg by nebulization every 6 (six) hours as needed for wheezing or shortness of breath.     [provider]  amitriptyline (ELAVIL) 25 MG tablet TAKE 4 TABLETS (100 MG TOTAL) BY MOUTH AT BEDTIME. MUST BE SEEN FOR FURTHER REFILLS. 04/01/22   Melvenia Beam, MD  cyanocobalamin (,VITAMIN B-12,) 1000 MCG/ML injection Inject 1,000 mcg into the muscle every 28 (twenty-eight) days. 03/31/21   [provider]  escitalopram (LEXAPRO) 20 MG tablet Take 20 mg by mouth at bedtime. 04/28/19   [provider]  famotidine (PEPCID) 40 MG tablet Take 40 mg by mouth daily. 12/12/20   [provider]  ferrous sulfate 325 (65 FE) MG tablet Take 325 mg by mouth daily.    [provider]  Galcanezumab-gnlm (EMGALITY) 120 MG/ML SOAJ Inject 120 mg into the skin every 30 (thirty) days. 01/06/22   Melvenia Beam, MD  hydrOXYzine (ATARAX/VISTARIL) 25 MG tablet Take 25 mg by mouth 2 (two) times daily as needed for anxiety. 12/29/18   [provider]  Lactulose 20 GM/30ML SOLN Take 30 mLs (20 g total) by mouth every 8 (eight) hours as needed (Constipation/bloating). 11/10/21   Daleen Bo, MD  montelukast (SINGULAIR) 10 MG tablet Take 10 mg by mouth at bedtime. 03/06/21   [provider]  MOVANTIK 25 MG TABS tablet Take 25 mg by mouth at bedtime. 04/12/19   [provider]  ondansetron (ZOFRAN ODT) 4 MG disintegrating tablet Take 1 tablet (4 mg total) by mouth every 8 (eight) hours as needed for nausea or vomiting. 01/06/21   Isla Pence, MD  Oxycodone HCl  10 MG TABS Take 10 mg by mouth 4 (four) times daily as needed (for pain). 11/05/21   [provider]  pantoprazole (PROTONIX) 40 MG tablet Take 40 mg by mouth daily. 04/29/21   [provider]  tiZANidine (ZANAFLEX) 4 MG tablet TAKE 1 OR 2 TABLETS BY MOUTH EVERY 8 HOURS AS NEEDED FOR MUSCLE SPASM (NEED OFFICE VISIT) 07/07/22   Melvenia Beam, MD  topiramate (TOPAMAX) 100 MG tablet Take 1 tablet (100 mg total) by mouth at bedtime. 01/06/22   Melvenia Beam, MD  XTAMPZA ER 13.5  MG C12A Take 1 capsule by mouth every 12 (twelve) hours. 11/05/21   [provider]      Allergies    Beta adrenergic blockers, Nsaids, and Tolmetin    Review of Systems   Review of Systems  Neurological:  Positive for headaches.  All other systems reviewed and are negative.   Physical Exam Updated Vital Signs BP 106/67   Temp 98 F (36.7 C) (Oral)   Resp (!) 24   Ht 5\' 4"  (1.626 m)   Wt 81.6 kg   BMI 30.90 kg/m  Physical Exam Vitals and nursing note reviewed. Exam conducted with a chaperone present Therisa Doyne, RN).  Constitutional:      General: She is not in acute distress.    Appearance: She is well-developed. She is not ill-appearing, toxic-appearing or diaphoretic.     Comments: Resting comfortably in bed  HENT:     Head: Normocephalic and atraumatic.  Eyes:     Conjunctiva/sclera: Conjunctivae normal.  Cardiovascular:     Rate and Rhythm: Normal rate and regular rhythm.     Heart sounds: No murmur heard. Pulmonary:     Effort: Pulmonary effort is normal. No respiratory distress.     Breath sounds: Normal breath sounds.  Abdominal:     Palpations: Abdomen is soft.     Tenderness: There is no abdominal tenderness.  Genitourinary:    Comments: No obvious blood or melena on DRE, no masses, step-offs or crepitus Musculoskeletal:        General: No swelling.     Cervical back: Normal range of motion and neck supple. No rigidity.  Skin:    General: Skin is warm and dry.     Capillary Refill: Capillary refill takes less than 2 seconds.  Neurological:     Mental Status: She is alert and oriented to person, place, and time.     GCS: GCS eye subscore is 4. GCS verbal subscore is 5. GCS motor subscore is 6.     Comments: No facial asymmetry, slurred speech, pronator drift.  Strength 5 out of 5 in bilateral upper extremities.  Strength slightly decreased in bilateral lower extremities. normal finger-to-nose and heel-to-shin.  Sensation intact in all extremities.   Psychiatric:        Mood and Affect: Mood normal.     ED Results / Procedures / Treatments   Labs (all labs ordered are listed, but only abnormal results are displayed) Labs Reviewed  BASIC METABOLIC PANEL - Abnormal; Notable for the following components:      Result Value   Chloride 114 (*)    CO2 17 (*)    Glucose, Bld 100 (*)    Calcium 7.7 (*)    All other components within normal limits  CBC WITH DIFFERENTIAL/PLATELET - Abnormal; Notable for the following components:   RBC 3.34 (*)    Hemoglobin 6.1 (*)    HCT 21.5 (*)  MCV 64.4 (*)    MCH 18.3 (*)    MCHC 28.4 (*)    RDW 21.5 (*)    All other components within normal limits  VITAMIN B12  FOLATE  IRON AND TIBC  FERRITIN  RETICULOCYTES  HEPATIC FUNCTION PANEL  POC OCCULT BLOOD, ED  TYPE AND SCREEN  PREPARE RBC (CROSSMATCH)    EKG EKG Interpretation  Date/Time:  Saturday July 17 2022 18:02:59 EDT Ventricular Rate:  86 PR Interval:  137 QRS Duration: 106 QT Interval:  376 QTC Calculation: 450 R Axis:   88 Text Interpretation: Sinus rhythm Confirmed by Godfrey Pick [phone]) on 07/17/2022 7:33:26 PM  Radiology No results found.  Procedures .Critical Care  Performed by: Roylene Reason, PA-C Authorized by: Roylene Reason, PA-C   Critical care provider statement:    Critical care time (minutes):  41   Critical care was necessary to treat or prevent imminent or life-threatening deterioration of the following conditions:  Circulatory failure   Critical care was time spent personally by me on the following activities:  Development of treatment plan with patient or surrogate, discussions with consultants, evaluation of patient's response to treatment, examination of patient, ordering and review of laboratory studies, ordering and review of radiographic studies, ordering and performing treatments and interventions, pulse oximetry, re-evaluation of patient's condition and review of old charts   Care  discussed with: admitting provider   Comments:     Symptomatic anemia.  Hemoglobin of 6.1     Medications Ordered in ED Medications  0.9 %  sodium chloride infusion (Manually program via Guardrails IV Fluids) (has no administration in time range)  sodium chloride 0.9 % bolus 1,000 mL (1,000 mLs Intravenous New Bag/Given 07/17/22 1902)  ketorolac (TORADOL) 15 MG/ML injection 15 mg (15 mg Intravenous Given 07/17/22 1855)  metoCLOPramide (REGLAN) injection 10 mg (10 mg Intravenous Given 07/17/22 1856)  dexamethasone (DECADRON) injection 4 mg (4 mg Intravenous Given 07/17/22 1858)    ED Course/ Medical Decision Making/ A&P Clinical Course as of 07/17/22 2100  Sat Jul 17, 2022  2034 Spoke with hospitalist Dr. Marlowe Sax.  Requesting neurology consultation to identify if MRI is necessary.  Will admit after this [AS]  2040 Spoke with neurology Dr. Leonel Ramsay.  Believes MRI brain and C-spine with and without contrast is reasonable.  Recommends reassessment of her headache and fatigue after blood transfusion [AS]    Clinical Course User Index [AS] Latanja Lehenbauer, [place] Folk, PA-C                             Medical Decision Making Amount and/or Complexity of Data Reviewed Labs: ordered. Radiology: ordered.  Risk Prescription drug management. Decision regarding hospitalization.  This patient presents to the ED for concern of headache, this involves an extensive number of treatment options, and is a complaint that carries with it a high risk of complications and morbidity. Emergent considerations for headache include subarachnoid hemorrhage, meningitis, temporal arteritis, glaucoma, cerebral ischemia, carotid/vertebral dissection, intracranial tumor, Venous sinus thrombosis, carbon monoxide poisoning, acute or chronic subdural hemorrhage.  Other considerations include: Migraine, Cluster headache, Hypertension, Caffeine, alcohol, or drug withdrawal, Pseudotumor cerebri, Arteriovenous malformation, Head  injury, Neurocysticercosis, Post-lumbar puncture, Preeclampsia, Tension headache, Sinusitis, Cervical arthritis, Refractive error causing strain, Dental abscess, Otitis media, Temporomandibular joint syndrome, Depression, Somatoform disorder (eg, somatization) Trigeminal neuralgia, Glossopharyngeal neuralgia.  Co morbidities that complicate the patient evaluation  MS, asthma, fibromyalgia, chronic pain syndrome, arthritis   My  initial workup includes headache cocktail, basic labs  Additional history obtained from: Nursing notes from this visit. Previous records within EMR system ED visit for similar on 04/29/2022 and 07/25/2021.  Had MRI at that time with no evidence of active demyelination  I ordered, reviewed and interpreted labs which include: BMP, CBC, Hemoccult, anemia panel.  Hemoglobin of 6.1 down from a baseline of approximately 8.  Cardiac Monitoring:  The patient was maintained on a cardiac monitor.  I personally viewed and interpreted the cardiac monitored which showed an underlying rhythm of: NSR  Consultations Obtained:  I requested consultation with the hospitalist, and discussed lab and imaging findings as well as pertinent plan - they recommend: Neurology consultation requesting MRI imaging.  Will admit for symptomatic anemia  I requested consultation with neurology Dr. Leonel Ramsay.  He recommends MRI brain and C-spine with and without contrast, formal consult if there are any new lesions and reassessment of headache after blood transfusion.  Afebrile, hemodynamically stable.  47 year old female presenting to the ED for evaluation of intermittent headaches and generalized fatigue.  Patient initially thought her symptoms were due to her MS. her physical exam is mostly reassuring.  There are no focal neurologic deficits.  She does have some decreased strength in bilateral lower extremities, however was ambulatory in the ED.  Chart review shows that similar presentation was  documented on 07/25/2021.  Question if this finding is due to lack of effort.  She was given headache cocktail in the ED and basic labs were ordered.  CBC revealed anemia with a hemoglobin of 6.1.  This is likely the cause of her fatigue.  Unknown source of anemia.  Patient denies having menstrual cycles at all and also denies hematemesis, melena, hematochezia.  She does not take supplements for her anemia.  Patient was given 2 units of packed red blood cells in the ED and admitted to hospitalist for further management.  Hemoccult negative, rectal exam revealed no bright red blood or melena in the stool.  Patient reported her headache to be slightly improved after interventions.  Neurology recommends MRI brain and C-spine with and without contrast to assess for possible MS flare.  This was ordered.  Stable at the time of admission.  Patient's case discussed with Dr. Doren Custard who agrees with plan to admit for symptomatic anemia.   Note: Portions of this report may have been transcribed using voice recognition software. Every effort was made to ensure accuracy; however, inadvertent computerized transcription errors may still be present.        Final Clinical Impression(s) / ED Diagnoses Final diagnoses:  Symptomatic anemia  Bad headache    Rx / DC Orders ED Discharge Orders     None         Nehemiah Massed 07/17/22 2100    Godfrey Pick, MD 07/18/22 1147

## 2022-07-17 NOTE — ED Triage Notes (Signed)
Pt c/o off and on headache for months-years. Pt also c/o back and bilateral arm/leg pain that started last night. Endorses blurry vision and nausea. Hx of ms.

## 2022-07-18 ENCOUNTER — Other Ambulatory Visit: Payer: Self-pay

## 2022-07-18 ENCOUNTER — Encounter (HOSPITAL_COMMUNITY): Payer: Self-pay | Admitting: Internal Medicine

## 2022-07-18 DIAGNOSIS — D649 Anemia, unspecified: Secondary | ICD-10-CM | POA: Diagnosis not present

## 2022-07-18 LAB — CBC
HCT: 37.4 % (ref 36.0–46.0)
Hemoglobin: 10.9 g/dL — ABNORMAL LOW (ref 12.0–15.0)
MCH: 19.3 pg — ABNORMAL LOW (ref 26.0–34.0)
MCHC: 29.1 g/dL — ABNORMAL LOW (ref 30.0–36.0)
MCV: 66.2 fL — ABNORMAL LOW (ref 80.0–100.0)
Platelets: 418 10*3/uL — ABNORMAL HIGH (ref 150–400)
RBC: 5.65 MIL/uL — ABNORMAL HIGH (ref 3.87–5.11)
RDW: 22.8 % — ABNORMAL HIGH (ref 11.5–15.5)
WBC: 7.6 10*3/uL (ref 4.0–10.5)
nRBC: 0 % (ref 0.0–0.2)

## 2022-07-18 LAB — BASIC METABOLIC PANEL
Anion gap: 13 (ref 5–15)
BUN: 8 mg/dL (ref 6–20)
CO2: 17 mmol/L — ABNORMAL LOW (ref 22–32)
Calcium: 8.9 mg/dL (ref 8.9–10.3)
Chloride: 110 mmol/L (ref 98–111)
Creatinine, Ser: 0.54 mg/dL (ref 0.44–1.00)
GFR, Estimated: >60 mL/min (ref >60)
Glucose, Bld: 92 mg/dL (ref 70–99)
Potassium: 3.6 mmol/L (ref 3.5–5.1)
Sodium: 140 mmol/L (ref 135–145)

## 2022-07-18 MED ORDER — TOPIRAMATE 100 MG PO TABS: 100.0000 mg | ORAL_TABLET | Freq: Every day | ORAL | Status: DC

## 2022-07-18 MED ORDER — FERROUS SULFATE 325 (65 FE) MG PO TABS: 325.0000 mg | ORAL_TABLET | Freq: Two times a day (BID) | ORAL | 1 refills | Status: DC

## 2022-07-18 MED ORDER — ORAL CARE MOUTH RINSE: 15.0000 mL | OROMUCOSAL | Status: DC | PRN

## 2022-07-18 MED ORDER — NALOXEGOL OXALATE 25 MG PO TABS
25.0000 mg | ORAL_TABLET | Freq: Every day | ORAL | Status: DC
  Filled 2022-07-18: qty 1

## 2022-07-18 MED ORDER — PANTOPRAZOLE SODIUM 40 MG PO TBEC
40.0000 mg | DELAYED_RELEASE_TABLET | Freq: Every day | ORAL | Status: DC
  Administered 2022-07-18: 40 mg via ORAL
  Filled 2022-07-18: qty 1

## 2022-07-18 MED ORDER — MORPHINE SULFATE (PF) 2 MG/ML IV SOLN
2.0000 mg | INTRAVENOUS | Status: DC | PRN
  Administered 2022-07-18: 2 mg via INTRAVENOUS
  Filled 2022-07-18: qty 1

## 2022-07-18 MED ORDER — CYANOCOBALAMIN 1000 MCG/ML IJ SOLN
1000.0000 ug | INTRAMUSCULAR | Status: DC
  Administered 2022-07-18: 1000 ug via INTRAMUSCULAR
  Filled 2022-07-18: qty 1

## 2022-07-18 MED ORDER — OXYCODONE HCL ER 10 MG PO T12A
10.0000 mg | EXTENDED_RELEASE_TABLET | Freq: Two times a day (BID) | ORAL | Status: DC
  Administered 2022-07-18: 10 mg via ORAL
  Filled 2022-07-18: qty 1

## 2022-07-18 MED ORDER — SODIUM CHLORIDE 0.9 % IV SOLN
510.0000 mg | Freq: Once | INTRAVENOUS | Status: AC
  Administered 2022-07-18: 510 mg via INTRAVENOUS
  Filled 2022-07-18: qty 17

## 2022-07-18 MED ORDER — CYANOCOBALAMIN 1000 MCG/ML IJ SOLN
1000.0000 ug | INTRAMUSCULAR | 2 refills | Status: DC
  Filled 2022-10-14: qty 1, 28d supply, fill #0

## 2022-07-18 MED ORDER — LACTULOSE 10 GM/15ML PO SOLN: 20.0000 g | Freq: Three times a day (TID) | ORAL | Status: DC | PRN

## 2022-07-18 MED ORDER — CALCIUM GLUCONATE-NACL 1-0.675 GM/50ML-% IV SOLN
1.0000 g | Freq: Once | INTRAVENOUS | Status: DC
  Filled 2022-07-18: qty 50

## 2022-07-18 MED ORDER — CALCIUM GLUCONATE-NACL 1-0.675 GM/50ML-% IV SOLN
1.0000 g | Freq: Once | INTRAVENOUS | Status: AC
  Administered 2022-07-18: 1000 mg via INTRAVENOUS
  Filled 2022-07-18: qty 50

## 2022-07-18 MED ORDER — FERROUS SULFATE 325 (65 FE) MG PO TABS
325.0000 mg | ORAL_TABLET | Freq: Every day | ORAL | Status: DC
  Administered 2022-07-18: 325 mg via ORAL
  Filled 2022-07-18: qty 1

## 2022-07-18 MED ORDER — AMITRIPTYLINE HCL 50 MG PO TABS: 100.0000 mg | ORAL_TABLET | Freq: Every day | ORAL | Status: DC

## 2022-07-18 MED ORDER — MONTELUKAST SODIUM 10 MG PO TABS: 10.0000 mg | ORAL_TABLET | Freq: Every day | ORAL | Status: DC

## 2022-07-18 NOTE — Plan of Care (Signed)

## 2022-07-18 NOTE — Discharge Summary (Signed)
Cindy Hoffman [id number] DOB: [birth year] DOA: 07/17/2022  PCP: Berkley Harvey, NP  Admit date: 07/17/2022  Discharge date: 07/18/2022  Admitted From: Home   Disposition:  Home   Recommendations for Outpatient Follow-up:   Follow up with PCP in 1-2 weeks  PCP Please obtain BMP/CBC, 2 view CXR in 1week,  (see Discharge instructions)   PCP Please follow up on the following pending results: Please check CBC, CMP, ionized calcium, magnesium, TSH, anemia panel, B12 in 7 to 10 days, outpatient iron deficiency and B12 deficiency anemia workup.   Home Health: None   Equipment/Devices: None  Consultations: None  Discharge Condition: Stable    CODE STATUS: Full    Diet Recommendation: Heart Healthy     Chief Complaint  Patient presents with   Headache     Brief history of present illness from the day of admission and additional interim summary     47 y.o. female with medical history significant of chronic migraine followed by neurology and receives botulism toxin injections, MS, occipital neuralgia, anxiety, arthritis, asthma, autoimmune encephalitis, chronic pain syndrome, GERD, OSA presented to the ED with headaches, blurry vision, and fatigue.  Workup showed significant anemia and she was admitted.                                                                 Hospital Course   Symptomatic and significant iron deficiency, B12 deficiency anemia.  In a patient with history of the same, noncompliant with iron and B12 supplementation, counseled on compliance, received 2 units of packed RBC transfusion with stable posttransfusion CBC, given IV iron along with B12 shot, will be discharged home with outpatient PCP follow-up.  Request PCP to please check CBC, CMP, ionized calcium, magnesium, TSH, anemia panel and B12  levels in 7 to 10 days arrange for outpatient anemia workup.  History of chronic migraine headaches, body aches and questionable history of MS.  MRI head and C-spine stable here.  Outpatient workup under the guidance of PCP.  She already takes plenty of narcotics in outpatient setting.  Defer this to PCP.  Hypocalcemia.  Replaced PCP to monitor.   Discharge diagnosis     Principal Problem:   Symptomatic anemia Active Problems:   Asthma, chronic   Chronic headaches   Normal anion gap metabolic acidosis   Anxiety   Chronic pain syndrome    Discharge instructions    Discharge Instructions     Discharge instructions   Complete by: As directed    Follow with Primary MD Berkley Harvey, NP in 7 days, get outpatient iron deficiency anemia workup done by a family physician.  Get CBC, CMP, anemia panel, [id number], folic acid levels-  checked next visit with your primary MD    Activity: As tolerated with Full  fall precautions use walker/cane & assistance as needed  Disposition Home    Diet: Heart Healthy   Special Instructions: If you have smoked or chewed Tobacco  in the last 2 yrs please stop smoking, stop any regular Alcohol  and or any Recreational drug use.  On your next visit with your primary care physician please Get Medicines reviewed and adjusted.  Please request your Prim.MD to go over all Hospital Tests and Procedure/Radiological results at the follow up, please get all Hospital records sent to your Prim MD by signing hospital release before you go home.  If you experience worsening of your admission symptoms, develop shortness of breath, life threatening emergency, suicidal or homicidal thoughts you must seek medical attention immediately by calling 911 or calling your MD immediately  if symptoms less severe.  You Must read complete instructions/literature along with all the possible adverse reactions/side effects for all the Medicines you take and that have been prescribed  to you. Take any new Medicines after you have completely understood and accpet all the possible adverse reactions/side effects.       Discharge Medications   Allergies as of 07/18/2022       Reactions   Beta Adrenergic Blockers Anaphylaxis   Nsaids    Other reaction(s): Other Contraindicated due to bariatric surgery   Tolmetin Other (See Comments)   Contraindicated due to bariatric surgery Contraindicated due to bariatric surgery        Medication List     STOP taking these medications    Lactulose 20 GM/30ML Soln       TAKE these medications    albuterol 108 (90 Base) MCG/ACT inhaler Commonly known as: VENTOLIN HFA Inhale 2 puffs into the lungs every 6 (six) hours as needed for wheezing or shortness of breath.   albuterol (2.5 MG/3ML) 0.083% nebulizer solution Commonly known as: PROVENTIL Take 2.5 mg by nebulization every 6 (six) hours as needed for wheezing or shortness of breath.   amitriptyline 25 MG tablet Commonly known as: ELAVIL TAKE 4 TABLETS (100 MG TOTAL) BY MOUTH AT BEDTIME. MUST BE SEEN FOR FURTHER REFILLS.   cyanocobalamin 1000 MCG/ML injection Commonly known as: VITAMIN B12 Inject 1 mL (1,000 mcg total) into the muscle every 28 (twenty-eight) days.   ferrous sulfate 325 (65 FE) MG tablet Take 1 tablet (325 mg total) by mouth 2 (two) times daily with a meal. What changed: when to take this   montelukast 10 MG tablet Commonly known as: SINGULAIR Take 10 mg by mouth at bedtime.   Movantik 25 MG Tabs tablet Generic drug: naloxegol oxalate Take 25 mg by mouth at bedtime.   ondansetron 4 MG disintegrating tablet Commonly known as: Zofran ODT Take 1 tablet (4 mg total) by mouth every 8 (eight) hours as needed for nausea or vomiting.   Oxycodone HCl 10 MG Tabs Take 10 mg by mouth 4 (four) times daily as needed (for pain).   pantoprazole 40 MG tablet Commonly known as: PROTONIX Take 40 mg by mouth daily.   tiZANidine 4 MG tablet Commonly  known as: ZANAFLEX TAKE 1 OR 2 TABLETS BY MOUTH EVERY 8 HOURS AS NEEDED FOR MUSCLE SPASM (NEED OFFICE VISIT) What changed: See the new instructions.   topiramate 100 MG tablet Commonly known as: TOPAMAX Take 1 tablet (100 mg total) by mouth at bedtime.   Xtampza ER 13.5 MG C12a Generic drug: oxyCODONE ER Take 1 capsule by mouth every 12 (twelve) hours.  Follow-up Information     Berkley Harvey, NP. Schedule an appointment as soon as possible for a visit in 1 week(s).   Specialty: Nurse Practitioner Why: Along with your OB, have your primary care physician and do out patient anemia workup as appropriate. Contact information: [address] [place] [address] [phone]                 Major procedures and Radiology Reports - PLEASE review detailed and final reports thoroughly  -       MR Cervical Spine W or Wo Contrast  Result Date: 07/18/2022 CLINICAL DATA:  Multiple sclerosis EXAM: MRI CERVICAL SPINE WITHOUT AND WITH CONTRAST TECHNIQUE: Multiplanar and multiecho pulse sequences of the cervical spine, to include the craniocervical junction and cervicothoracic junction, were obtained without and with intravenous contrast. CONTRAST:  71mL GADAVIST GADOBUTROL 1 MMOL/ML IV SOLN COMPARISON:  05/21/2021 FINDINGS: Motion degraded examination. Alignment: Physiologic. Vertebrae: No fracture, evidence of discitis, or bone lesion. Cord: Normal signal and morphology. Posterior Fossa, vertebral arteries, paraspinal tissues: Negative. Disc levels: C1-2: Unremarkable. C2-3: Normal disc space and facet joints. There is no spinal canal stenosis. No neural foraminal stenosis. C3-4: Normal disc space and facet joints. There is no spinal canal stenosis. No neural foraminal stenosis. C4-5: Normal disc space and facet joints. There is no spinal canal stenosis. No neural foraminal stenosis. C5-6: Small left subarticular disc protrusion. There is no spinal canal stenosis. No neural  foraminal stenosis. C6-7: Normal disc space and facet joints. There is no spinal canal stenosis. No neural foraminal stenosis. C7-T1: Normal disc space and facet joints. There is no spinal canal stenosis. No neural foraminal stenosis. IMPRESSION: 1. Motion degraded examination, but no visible demyelinating disease within the cervical spinal cord. 2. Mild cervical degenerative disc disease without spinal canal or neural foraminal stenosis. Electronically Signed   By: Ulyses Jarred M.D.   On: 07/18/2022 00:05   MR BRAIN W WO CONTRAST  Result Date: 07/17/2022 CLINICAL DATA:  Multiple sclerosis.  Headaches. EXAM: MRI HEAD WITHOUT AND WITH CONTRAST TECHNIQUE: Multiplanar, multiecho pulse sequences of the brain and surrounding structures were obtained without and with intravenous contrast. CONTRAST:  68mL GADAVIST GADOBUTROL 1 MMOL/ML IV SOLN COMPARISON:  Brain MRI 07/25/2021 FINDINGS: Brain: No acute infarct, mass effect or extra-axial collection. No chronic microhemorrhage or siderosis. Unchanged pattern of supratentorial white matter lesions consistent with demyelinating disease. No contrast-enhancing lesions. The midline structures are normal. Vascular: Normal flow voids. Skull and upper cervical spine: Normal marrow signal. Sinuses/Orbits: Negative. Other: None. IMPRESSION: Unchanged pattern of supratentorial white matter lesions consistent with multiple sclerosis. No active demyelinating lesions. Electronically Signed   By: Ulyses Jarred M.D.   On: 07/17/2022 23:59    Micro Results    No results found for this or any previous visit (from the past 240 hour(s)).  Today   Subjective    Catoya Femrite today has no headache,no chest abdominal pain,no new weakness tingling or numbness, feels much better wants to go home today.     Objective   Blood pressure 125/83, pulse 81, temperature 98 F (36.7 C), temperature source Oral, resp. rate 17, height 5\' 4"  (1.626 m), weight 82.3 kg, SpO2 100  %.   Intake/Output Summary (Last 24 hours) at 07/18/2022 1104 Last data filed at 07/18/2022 0800 Gross per 24 hour  Intake 1942.24 ml  Output 0 ml  Net 1942.24 ml    Exam  Awake Alert, No new F.N deficits,    [email or web address].AT,PERRAL  Supple Neck,   Symmetrical Chest wall movement, Good air movement bilaterally, CTAB RRR,No Gallops,   +ve B.Sounds, Abd Soft, Non tender,  No Cyanosis, Clubbing or edema    Data Review   Recent Labs  Lab 07/17/22 1841 07/18/22 1019  WBC 5.1 7.6  HGB 6.1* 10.9*  HCT 21.5* 37.4  PLT 359 418*  MCV 64.4* 66.2*  MCH 18.3* 19.3*  MCHC 28.4* 29.1*  RDW 21.5* 22.8*  LYMPHSABS 1.4  --   MONOABS 0.2  --   EOSABS 0.1  --   BASOSABS 0.1  --     Recent Labs  Lab 07/17/22 1841 07/17/22 2120 07/18/22 1019  NA 138  --  140  K 3.6  --  3.6  CL 114*  --  110  CO2 17*  --  17*  ANIONGAP 7  --  13  GLUCOSE 100*  --  92  BUN 8  --  8  CREATININE 0.49  --  0.54  AST  --  18  --   ALT  --  14  --   ALKPHOS  --  127*  --   BILITOT  --  0.3  --   ALBUMIN  --  3.2*  --   CALCIUM 7.7*  --  8.9     Total Time in preparing paper work, data evaluation and todays exam - 35 minutes  Signature  -    Lala Lund M.D on 07/18/2022 at 11:04 AM   -  To page go to [email or web address]

## 2022-07-18 NOTE — Discharge Instructions (Signed)
Follow with Primary MD Berkley Harvey, NP in 7 days, get outpatient iron deficiency anemia workup done by a family physician.  Get CBC, CMP, anemia panel, [id number], folic acid levels-  checked next visit with your primary MD    Activity: As tolerated with Full fall precautions use walker/cane & assistance as needed  Disposition Home    Diet: Heart Healthy   Special Instructions: If you have smoked or chewed Tobacco  in the last 2 yrs please stop smoking, stop any regular Alcohol  and or any Recreational drug use.  On your next visit with your primary care physician please Get Medicines reviewed and adjusted.  Please request your Prim.MD to go over all Hospital Tests and Procedure/Radiological results at the follow up, please get all Hospital records sent to your Prim MD by signing hospital release before you go home.  If you experience worsening of your admission symptoms, develop shortness of breath, life threatening emergency, suicidal or homicidal thoughts you must seek medical attention immediately by calling 911 or calling your MD immediately  if symptoms less severe.  You Must read complete instructions/literature along with all the possible adverse reactions/side effects for all the Medicines you take and that have been prescribed to you. Take any new Medicines after you have completely understood and accpet all the possible adverse reactions/side effects.

## 2022-07-18 NOTE — TOC Transition Note (Signed)
Transition of Care [facility]) - CM/SW Discharge Note   Patient Details  Name: Cindy Hoffman MRN: [id number] Date of Birth: 12-03-1975  [facility] CM/SW Contact:  Zenon Mayo, RN Phone Number: 07/18/2022, 10:59 AM   Clinical Narrative:     Patient is for dc today, no NCM consult.         Patient Goals and CMS Choice      Discharge Placement                         Discharge Plan and Services Additional resources added to the After Visit Summary for                                       Social Determinants of Health (SDOH) Interventions SDOH Screenings   Food Insecurity: No Food Insecurity (07/18/2022)  Housing: Low Risk  (07/18/2022)  Transportation Needs: No Transportation Needs (07/18/2022)  Utilities: Not At Risk (07/18/2022)  Tobacco Use: High Risk (07/18/2022)     Readmission Risk Interventions     No data to display

## 2022-07-18 NOTE — Progress Notes (Signed)
New Admission Note:  Arrival Method: Stretcher Mental Orientation:b Alert and oriented x 4 Telemetry: Box 03. NSR Assessment: Completed Skin: Warm and dry  IV: NSL  Pain: 8/10 generalized Tubes: N/A Safety Measures: Safety Fall Prevention Plan initiated.  Admission: Completed 5 M  Orientation: Patient has been orientated to the room, unit and the staff. Welcome booklet given.  Family: N/A  Orders have been reviewed and implemented. Will continue to monitor the patient. Call light has been placed within reach and bed alarm has been activated.   Sima Matas BSN, RN  Phone Number: [phone]

## 2022-07-18 NOTE — Progress Notes (Signed)
Pt. Stated she wanted to go for a walk at around 0700. Pt. Was educated she could take a walk on the unit but not leave the unit. Pt. Went outside to smoke a cigarette, security was alerted, pt. Was escorted back to the unit by security safely, pt. Was educated that smoking is against [place] policy and pt. Verbalized understanding, cigarettes and lighter stored in pt. Belonging bin behind the desk, Dr. Candiss Norse made aware  Cindy Hoffman

## 2022-07-18 NOTE — Progress Notes (Signed)
DISCHARGE NOTE HOME AINA GORHAM to be discharged Home per MD order. Discussed prescriptions and follow up appointments with the patient. Prescriptions given to patient; medication list explained in detail. Patient verbalized understanding.  Skin clean, dry and intact without evidence of skin break down, no evidence of skin tears noted. IV catheter discontinued intact. Site without signs and symptoms of complications. Dressing and pressure applied. Pt denies pain at the site currently. No complaints noted.  Patient free of lines, drains, and wounds.   An After Visit Summary (AVS) was printed and given to the patient. Patient escorted via wheelchair, and discharged home via private auto.  Anastasio Auerbach, RN

## 2022-07-19 LAB — TYPE AND SCREEN
ABO/RH(D): O POS
Antibody Screen: NEGATIVE
Unit division: 0
Unit division: 0

## 2022-07-19 LAB — BPAM RBC
Blood Product Expiration Date: 202404122359
Blood Product Expiration Date: 202404122359
ISSUE DATE / TIME: 202403170014
ISSUE DATE / TIME: 202403170305
Unit Type and Rh: 5100
Unit Type and Rh: 5100

## 2022-07-24 ENCOUNTER — Other Ambulatory Visit: Payer: Self-pay | Admitting: Neurology

## 2022-07-30 ENCOUNTER — Other Ambulatory Visit: Payer: Self-pay | Admitting: Neurology

## 2022-08-27 ENCOUNTER — Other Ambulatory Visit: Payer: Self-pay | Admitting: Neurology

## 2022-09-30 ENCOUNTER — Emergency Department (HOSPITAL_COMMUNITY): Payer: Medicare HMO

## 2022-09-30 ENCOUNTER — Other Ambulatory Visit: Payer: Self-pay

## 2022-09-30 ENCOUNTER — Emergency Department (HOSPITAL_COMMUNITY)
Admission: EM | Admit: 2022-09-30 | Discharge: 2022-10-03 | Disposition: A | Discharge status: Home / Self Care | Payer: Medicare HMO | Attending: Emergency Medicine | Admitting: Emergency Medicine

## 2022-09-30 DIAGNOSIS — F29 Unspecified psychosis not due to a substance or known physiological condition: Secondary | ICD-10-CM | POA: Diagnosis not present

## 2022-09-30 DIAGNOSIS — F17210 Nicotine dependence, cigarettes, uncomplicated: Secondary | ICD-10-CM | POA: Insufficient documentation

## 2022-09-30 DIAGNOSIS — E876 Hypokalemia: Secondary | ICD-10-CM | POA: Diagnosis not present

## 2022-09-30 DIAGNOSIS — F22 Delusional disorders: Secondary | ICD-10-CM | POA: Diagnosis not present

## 2022-09-30 DIAGNOSIS — R4182 Altered mental status, unspecified: Secondary | ICD-10-CM | POA: Diagnosis present

## 2022-09-30 DIAGNOSIS — J45909 Unspecified asthma, uncomplicated: Secondary | ICD-10-CM | POA: Insufficient documentation

## 2022-09-30 DIAGNOSIS — F419 Anxiety disorder, unspecified: Secondary | ICD-10-CM | POA: Diagnosis not present

## 2022-09-30 DIAGNOSIS — R443 Hallucinations, unspecified: Secondary | ICD-10-CM | POA: Diagnosis present

## 2022-09-30 LAB — PREGNANCY, URINE: Preg Test, Ur: NEGATIVE

## 2022-09-30 LAB — CBC WITH DIFFERENTIAL/PLATELET
Abs Immature Granulocytes: 0.02 10*3/uL (ref 0.00–0.07)
Basophils Absolute: 0.1 10*3/uL (ref 0.0–0.1)
Basophils Relative: 1 %
Eosinophils Absolute: 0.1 10*3/uL (ref 0.0–0.5)
Eosinophils Relative: 1 %
HCT: 44.9 % (ref 36.0–46.0)
Hemoglobin: 14.1 g/dL (ref 12.0–15.0)
Immature Granulocytes: 0 %
Lymphocytes Relative: 23 %
Lymphs Abs: 1.5 10*3/uL (ref 0.7–4.0)
MCH: 25.7 pg — ABNORMAL LOW (ref 26.0–34.0)
MCHC: 31.4 g/dL (ref 30.0–36.0)
MCV: 81.8 fL (ref 80.0–100.0)
Monocytes Absolute: 0.7 10*3/uL (ref 0.1–1.0)
Monocytes Relative: 11 %
Neutro Abs: 4.2 10*3/uL (ref 1.7–7.7)
Neutrophils Relative %: 64 %
Platelets: 256 10*3/uL (ref 150–400)
RBC: 5.49 MIL/uL — ABNORMAL HIGH (ref 3.87–5.11)
RDW: 23.1 % — ABNORMAL HIGH (ref 11.5–15.5)
WBC: 6.6 10*3/uL (ref 4.0–10.5)
nRBC: 0 % (ref 0.0–0.2)

## 2022-09-30 LAB — RAPID URINE DRUG SCREEN, HOSP PERFORMED
Amphetamines: NOT DETECTED
Barbiturates: NOT DETECTED
Benzodiazepines: NOT DETECTED
Cocaine: NOT DETECTED
Opiates: NOT DETECTED
Tetrahydrocannabinol: NOT DETECTED

## 2022-09-30 LAB — COMPREHENSIVE METABOLIC PANEL
ALT: 19 U/L (ref 0–44)
AST: 26 U/L (ref 15–41)
Albumin: 3.8 g/dL (ref 3.5–5.0)
Alkaline Phosphatase: 147 U/L — ABNORMAL HIGH (ref 38–126)
Anion gap: 14 (ref 5–15)
BUN: 7 mg/dL (ref 6–20)
CO2: 26 mmol/L (ref 22–32)
Calcium: 8.9 mg/dL (ref 8.9–10.3)
Chloride: 95 mmol/L — ABNORMAL LOW (ref 98–111)
Creatinine, Ser: 0.45 mg/dL (ref 0.44–1.00)
GFR, Estimated: >60 mL/min (ref >60)
Glucose, Bld: 116 mg/dL — ABNORMAL HIGH (ref 70–99)
Potassium: 3.2 mmol/L — ABNORMAL LOW (ref 3.5–5.1)
Sodium: 135 mmol/L (ref 135–145)
Total Bilirubin: 0.8 mg/dL (ref 0.3–1.2)
Total Protein: 7.8 g/dL (ref 6.5–8.1)

## 2022-09-30 LAB — I-STAT BETA HCG BLOOD, ED (MC, WL, AP ONLY): I-stat hCG, quantitative: 5.2 m[IU]/mL — ABNORMAL HIGH (ref <5)

## 2022-09-30 LAB — ETHANOL: Alcohol, Ethyl (B): <10 mg/dL (ref <10)

## 2022-09-30 LAB — MAGNESIUM: Magnesium: 2.3 mg/dL (ref 1.7–2.4)

## 2022-09-30 MED ORDER — OXYCODONE HCL 5 MG PO TABS
10.0000 mg | ORAL_TABLET | Freq: Four times a day (QID) | ORAL | Status: DC | PRN
  Administered 2022-10-01 – 2022-10-03 (×9): 10 mg via ORAL
  Filled 2022-09-30 (×10): qty 2

## 2022-09-30 MED ORDER — HALOPERIDOL LACTATE 5 MG/ML IJ SOLN
5.0000 mg | Freq: Once | INTRAMUSCULAR | Status: AC
  Administered 2022-09-30: 5 mg via INTRAMUSCULAR
  Filled 2022-09-30: qty 1

## 2022-09-30 MED ORDER — BACLOFEN 10 MG PO TABS
10.0000 mg | ORAL_TABLET | Freq: Three times a day (TID) | ORAL | Status: DC | PRN
  Administered 2022-10-01 – 2022-10-03 (×4): 10 mg via ORAL
  Filled 2022-09-30 (×4): qty 1

## 2022-09-30 MED ORDER — LORAZEPAM 2 MG/ML IJ SOLN
INTRAMUSCULAR | Status: AC
  Administered 2022-09-30: 2 mg via INTRAMUSCULAR
  Filled 2022-09-30: qty 1

## 2022-09-30 MED ORDER — TIZANIDINE HCL 4 MG PO TABS: 4.0000 mg | ORAL_TABLET | Freq: Three times a day (TID) | ORAL | Status: DC | PRN

## 2022-09-30 MED ORDER — DIPHENHYDRAMINE HCL 50 MG/ML IJ SOLN
50.0000 mg | Freq: Once | INTRAMUSCULAR | Status: AC
  Administered 2022-09-30: 50 mg via INTRAMUSCULAR
  Filled 2022-09-30: qty 1

## 2022-09-30 MED ORDER — OXYCODONE HCL 5 MG PO TABS: 10.0000 mg | ORAL_TABLET | Freq: Four times a day (QID) | ORAL | Status: DC | PRN

## 2022-09-30 MED ORDER — LORAZEPAM 2 MG/ML IJ SOLN
2.0000 mg | Freq: Once | INTRAMUSCULAR | Status: AC
  Filled 2022-09-30: qty 1

## 2022-09-30 MED ORDER — TOPIRAMATE 100 MG PO TABS
100.0000 mg | ORAL_TABLET | Freq: Every day | ORAL | Status: DC
  Administered 2022-09-30 – 2022-10-02 (×3): 100 mg via ORAL
  Filled 2022-09-30 (×3): qty 1

## 2022-09-30 MED ORDER — NALOXEGOL OXALATE 25 MG PO TABS
25.0000 mg | ORAL_TABLET | Freq: Every day | ORAL | Status: DC
  Administered 2022-09-30 – 2022-10-02 (×3): 25 mg via ORAL
  Filled 2022-09-30 (×5): qty 1

## 2022-09-30 MED ORDER — AMITRIPTYLINE HCL 25 MG PO TABS
100.0000 mg | ORAL_TABLET | Freq: Every day | ORAL | Status: DC
  Administered 2022-09-30 – 2022-10-02 (×3): 100 mg via ORAL
  Filled 2022-09-30 (×3): qty 4

## 2022-09-30 MED ORDER — ALBUTEROL SULFATE HFA 108 (90 BASE) MCG/ACT IN AERS
INHALATION_SPRAY | RESPIRATORY_TRACT | Status: AC
  Filled 2022-09-30: qty 6.7

## 2022-09-30 MED ORDER — OLANZAPINE 5 MG PO TBDP
5.0000 mg | ORAL_TABLET | Freq: Three times a day (TID) | ORAL | Status: DC | PRN
  Administered 2022-09-30: 5 mg via ORAL
  Filled 2022-09-30: qty 1

## 2022-09-30 MED ORDER — ZIPRASIDONE MESYLATE 20 MG IM SOLR: 20.0000 mg | INTRAMUSCULAR | Status: DC | PRN

## 2022-09-30 MED ORDER — ALBUTEROL SULFATE HFA 108 (90 BASE) MCG/ACT IN AERS
2.0000 | INHALATION_SPRAY | Freq: Once | RESPIRATORY_TRACT | Status: AC
  Administered 2022-09-30: 2 via RESPIRATORY_TRACT
  Filled 2022-09-30: qty 6.7

## 2022-09-30 MED ORDER — LORAZEPAM 1 MG PO TABS: 1.0000 mg | ORAL_TABLET | ORAL | Status: DC | PRN

## 2022-09-30 NOTE — ED Notes (Signed)
Assumed care of this pt. Pt calm and cooperative at this time.

## 2022-09-30 NOTE — ED Notes (Signed)
Pt has been dressed out into burgundy scrubs belongings have been bagged (1 bag incl. Clothing and purse) and labeled and put in cabinets at the 16-18 nurses station

## 2022-09-30 NOTE — ED Provider Notes (Signed)
  Physical Exam  BP 124/88   Pulse (!) 102   Temp 99.3 F (37.4 C) (Oral)   Resp (!) 26   Ht 5\' 4"  (1.626 m)   Wt 82 kg   SpO2 95%   BMI 31.03 kg/m   Physical Exam  Procedures  Procedures  ED Course / MDM    Medical Decision Making I was called to the room around 6 PM.  Patient was apparently having visual and auditory hallucinations and then had a witnessed seizure activity.  When I got there she was postictal and very confused and agitated.  She was brought back to the medical side and because of the agitation, she required restraints and Ativan and Haldol.  Will get CT head and consult neurology   7:30 PM I discussed with Dr. Iver Nestle from neurology. Below are her recs  -Continue home topiramate, gabapentin and baclofen at the doses she has been taking these medications (will require further chart review/dispense report review and collateral from patient/family to determine dosing)  -Would manage this single generalized tonic-clonic event as possible substance withdrawal seizure at this time (CIWA protocol, benzodiazepines and/or phenobarb)  -Monitor electrolytes and replete as needed (magnesium added on), potassium mildly low at 3.2  -If patient is not returning to baseline, has further concern for seizure activity or other neurological questions arise, please reach out to neurology   I reassessed patient and her CT head is normal.  Patient was given her home dose of seizure meds.  Patient has been eating and drinking and back to baseline now.  Given that she has a history of seizures and is on compliant with her meds, patient can be medically cleared for psych eval.    Amount and/or Complexity of Data Reviewed Labs: ordered. Radiology: ordered.  Risk Prescription drug management.          Charlynne Pander, MD 09/30/22 [phone]
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
## 2022-09-30 NOTE — Progress Notes (Signed)
LCSW Progress Note  [id number]   Cindy Hoffman  09/30/2022  11:30 PM    Inpatient Behavioral Health Placement  Pt meets inpatient criteria per Alona Bene, PMHNP. There are no available beds within [facility] per [facility] [employer]. Referral was sent to the following facilities;   Destination  Service Provider Address Phone Fax  [facility]  [address], [place] [address] [phone]  [facility] [address]  [address] [place] [address] [phone]  [facility]  [address] [place], [place] [address] [phone]  [facility] [address] [place]  [address], [place] [address] [phone]  [facility]  [address] [place] [address] [phone]  [facility]  [address], [place] [address] [phone]  [facility]  [address] [place] [address] [phone]  [facility]  [address] [place], [place] [address] [phone]  [facility]  [address] [place] [address] [phone]  [facility]  [address] [place] [address] [phone]  [facility]  [address], [place] [address] [phone]  [facility]  [address], [place] [address] [phone]  [facility]  [address], [place] [address] [phone]  [facility]  [address] [place] [address] [phone]  [facility]  [address], [place] [address] [phone]  [facility]  [address],  [place] [address] [phone]  [facility]  [address], [place] [address] [phone]  [facility]  [address] [place] [address] [phone]  [facility]  [address] [place] [address] [phone]  [facility]  [address] [place] [address] [phone]  [facility]  [address] [place] [address] [phone]  [facility]  [address] [place] [address] [phone]  [facility]  [address] [place], [place] [address] [phone]  [facility]  [address], [place] [address] [phone]  [facility]  [address] [place] [address] [phone]    Situation ongoing,  CSW will follow up.    Maryjean Ka, MSW, LCSWA 09/30/2022 11:30 PM

## 2022-09-30 NOTE — Consult Note (Addendum)
[facility] ED ASSESSMENT   Reason for Consult:  Psych Consult Referring Physician:  Aline August Patient Identification: Cindy Hoffman MRN:  [id number] ED Chief Complaint: Altered mental status  Diagnosis:  Principal Problem:   Altered mental status Active Problems:   Hallucinations   ED Assessment Time Calculation: Start Time: 1500 Stop Time: 1530 Total Time in Minutes (Assessment Completion): 30   HPI: Per Triage Note: Pt BIB EMS with visual and auditory hallucinations. Denies SI/HI. "I lost my soul a few days ago, I've lived in heaven a few times"    Subjective: Cindy Hoffman, 47 y.o., female patient seen face to face by this provider, consulted with Dr. Viviano Simas; and chart reviewed on 09/30/22.  On evaluation Cindy Hoffman reports that she is here in the [facility], ED due to heart palpitations, and 1 part of her soul is black.  Patient would not elaborate on what she meant, rush provider was talking stating that she had to hurry up because her father was here.  Patient had a bizarre look at her, and stated that "you know, father is, don't you" provider stated that she did not know and patient stated "my father is God, he is all our Fathers ".  Patient stated that she did not work, she lived with her father, she denied SI/HI/AVH.  As provider was talking with her she began pointing to a chair that was covered up with her she and a pillowcase, she stated "that is my mother, and you all are letting the rain drop on her, and don't care the ceiling is leaking all over her.  Patient was not interested in speaking with provider anymore, states she had to get ready because her father was here to pick her up.   During evaluation Cindy Hoffman is in her room restless, she sits on bed and then stands up, and sits back down. She is alert, unable to assess her orientation due to her excitatory mood. Her mood is hyper with congruent/ bizarre affect. She has pressured speech, and anxious behavior. She  denies suicidal/self-harm/homicidal ideation, psychosis, and paranoia.  She does have a history of alcohol withdrawal seizures listed on her chart from 2005.   Spoke with patient's father Alpha Gula, he stated that patient lives with him, her brother, and patients daughter.  He states that patient has been hallucinating a lot, for a while, says that she has been evaluated at [facility] several times.  Says that lately she has been drinking a lot more alcohol, and hiding it in her room.  He is unable to tell how much alcohol the patient drinks, but says she does not eat or sleep, she drinks all day.  He also states that patient has become a heavy smoker, smoking cigarettes and vaping.  States that in 2018 she had bariatric surgery. Mr. Nelda Marseille says that she has never been admitted to an inpatient psychiatric facility, and does not take her medications that were prescribed to her by [facility].  He states that he is in agreement with patient being admitted to an inpatient psychiatric facility.  Past Psychiatric History: Hallucinations  Risk to Self or Others: Risk to Self: Yes Risk to Others:  No  Prior Inpatient Therapy: No Prior Outpatient Therapy:  Yes  [place] Scale:  Flowsheet Row ED from 09/30/2022 in [facility] Emergency Department at [facility] ED to Hosp-Admission (Discharged) from 07/17/2022 in [facility] 32M KID[facility]IT ED from 07/16/2022 in [facility]  C-SSRS RISK CATEGORY No Risk No Risk No Risk       AIMS:  , , ,  ,   ASAM:    Substance Abuse:     Past Medical History:  Past Medical History:  Diagnosis Date   Anxiety    Arthritis    knees   Asthma    Autoimmune encephalitis    Chronic pain syndrome    Complication of anesthesia    "STATES WAKES UP AND CANT BREATHE AND NEEDS BREATHING TREATMENT  IN PACU"   Constipation due to opioid therapy    Dysrhythmia    palpitations- "from thyroid"   Fibromyalgia     Fracture of right foot    Gastric ulcer 2018   GERD (gastroesophageal reflux disease)    History of kidney stones    "inbedded"   Migraine    Mirgraine-   Multiple sclerosis (HCC)    Myofacial muscle pain    Obesity    Occipital neuralgia 2016   OSA (obstructive sleep apnea)    Has CPAP   Sleep apnea     NO CPAP--could not tolerates mask   Thyroid disease    Viral respiratory infection 06/26/2013   ? influenza    Past Surgical History:  Procedure Laterality Date   COLONOSCOPY      x2   ENDOMETRIAL ABLATION     ENDOMETRIAL ABLATION  07/17/2017   GALLBLADDER SURGERY  06/2016   GASTRIC ROUX-EN-Y  05/2016   HIP ARTHROSCOPY Right 05/10/2019   Procedure: Right hip arthroscopy with labrum repair and  femoroplasty;  Surgeon: Yolonda Kida, MD;  Location: [facility] OR;  Service: Orthopedics;  Laterality: Right;  2.5 hrs   NASAL SEPTUM SURGERY     THYROID LOBECTOMY Right 07/19/2013   Procedure: THYROID LOBECTOMY;  Surgeon: Shelly Rubenstein, MD;  Location: [facility] ORS;  Service: General;  Laterality: Right;   TUBAL LIGATION     WISDOM TOOTH EXTRACTION     Family History:  Family History  Problem Relation Age of Onset   Hypertension Mother    Hypertension Father    Prostate cancer Father    Diabetes Sister    Cancer Paternal Grandmother        breast    Social History:  Social History   Substance and Sexual Activity  Alcohol Use Yes   Alcohol/week: 2.0 standard drinks of alcohol   Types: 2 Glasses of wine per week     Social History   Substance and Sexual Activity  Drug Use No    Social History   Socioeconomic History   Marital status: Divorced    Spouse name: Not on file   Number of children: 1   Years of education: GED   Highest education level: Not on file  Occupational History   Occupation: unemployed  Tobacco Use   Smoking status: Every Day    Packs/day: 0.50    Years: 0.00    Additional pack years: 0.00    Total pack years: 0.00    Types: Cigarettes     Last attempt to quit: 11/2015    Years since quitting: 6.9   Smokeless tobacco: Never   Tobacco comments:    1-2 cigs per day  Vaping Use   Vaping Use: Never used  Substance and Sexual Activity   Alcohol use: Yes    Alcohol/week: 2.0 standard drinks of alcohol    Types: 2 Glasses of wine per week   Drug use: No  Sexual activity: Not on file  Other Topics Concern   Not on file  Social History Narrative   Lives with father   Caffeine use: rare   Right handed   Social Determinants of Health   Financial Resource Strain: Not on file  Food Insecurity: No Food Insecurity (07/18/2022)   Hunger Vital Sign    Worried About Running Out of Food in the Last Year: Never true    Ran Out of Food in the Last Year: Never true  Transportation Needs: No Transportation Needs (07/18/2022)   PRAPARE - Administrator, Civil Service (Medical): No    Lack of Transportation (Non-Medical): No  Physical Activity: Not on file  Stress: Not on file  Social Connections: Not on file   Additional Social History:    Allergies:   Allergies  Allergen Reactions   Beta Adrenergic Blockers Anaphylaxis   Nsaids Other (See Comments)    Contraindicated due to bariatric surgery   Tolmetin Other (See Comments)    Contraindicated due to bariatric surgery     Labs:  Results for orders placed or performed during the hospital encounter of 09/30/22 (from the past 48 hour(s))  Comprehensive metabolic panel     Status: Abnormal   Collection Time: 09/30/22 11:36 AM  Result Value Ref Range   Sodium 135 135 - 145 mmol/L   Potassium 3.2 (L) 3.5 - 5.1 mmol/L   Chloride 95 (L) 98 - 111 mmol/L   CO2 26 22 - 32 mmol/L   Glucose, Bld 116 (H) 70 - 99 mg/dL    Comment: Glucose reference range applies only to samples taken after fasting for at least 8 hours.   BUN 7 6 - 20 mg/dL   Creatinine, Ser 9.14 0.44 - 1.00 mg/dL   Calcium 8.9 8.9 - 78.2 mg/dL   Total Protein 7.8 6.5 - 8.1 g/dL   Albumin 3.8 3.5 - 5.0  g/dL   AST 26 15 - 41 U/L   ALT 19 0 - 44 U/L   Alkaline Phosphatase 147 (H) 38 - 126 U/L   Total Bilirubin 0.8 0.3 - 1.2 mg/dL   GFR, Estimated >95 >62 mL/min    Comment: (NOTE) Calculated using the CKD-EPI Creatinine Equation (2021)    Anion gap 14 5 - 15    Comment: Performed at [facility], [address] [place], [place] [address]  Ethanol     Status: None   Collection Time: 09/30/22 11:36 AM  Result Value Ref Range   Alcohol, Ethyl (B) <10 <10 mg/dL    Comment: (NOTE) Lowest detectable limit for serum alcohol is 10 mg/dL.  For medical purposes only. Performed at [facility], [address], [place], [place] [address]   CBC with Diff     Status: Abnormal   Collection Time: 09/30/22 11:36 AM  Result Value Ref Range   WBC 6.6 4.0 - 10.5 K/uL   RBC 5.49 (H) 3.87 - 5.11 MIL/uL   Hemoglobin 14.1 12.0 - 15.0 g/dL   HCT 96.2 95.2 - 84.1 %   MCV 81.8 80.0 - 100.0 fL   MCH 25.7 (L) 26.0 - 34.0 pg   MCHC 31.4 30.0 - 36.0 g/dL   RDW 32.4 (H) 40.1 - 02.7 %   Platelets 256 150 - 400 K/uL   nRBC 0.0 0.0 - 0.2 %   Neutrophils Relative % 64 %   Neutro Abs 4.2 1.7 - 7.7 K/uL   Lymphocytes Relative 23 %  Lymphs Abs 1.5 0.7 - 4.0 K/uL   Monocytes Relative 11 %   Monocytes Absolute 0.7 0.1 - 1.0 K/uL   Eosinophils Relative 1 %   Eosinophils Absolute 0.1 0.0 - 0.5 K/uL   Basophils Relative 1 %   Basophils Absolute 0.1 0.0 - 0.1 K/uL   Immature Granulocytes 0 %   Abs Immature Granulocytes 0.02 0.00 - 0.07 K/uL    Comment: Performed at [facility], [address] [place], [place] [address]  I-Stat beta hCG blood, ED     Status: Abnormal   Collection Time: 09/30/22 12:01 PM  Result Value Ref Range   I-stat hCG, quantitative 5.2 (H) <5 mIU/mL   Comment 3            Comment:   GEST. AGE      CONC.  (mIU/mL)   <=1 WEEK        5 - 50     2 WEEKS       50 - 500     3 WEEKS       100 - 10,000     4 WEEKS     1,000 -  30,000        FEMALE AND NON-PREGNANT FEMALE:     LESS THAN 5 mIU/mL   Urine rapid drug screen (hosp performed)     Status: None   Collection Time: 09/30/22 12:32 PM  Result Value Ref Range   Opiates NONE DETECTED NONE DETECTED   Cocaine NONE DETECTED NONE DETECTED   Benzodiazepines NONE DETECTED NONE DETECTED   Amphetamines NONE DETECTED NONE DETECTED   Tetrahydrocannabinol NONE DETECTED NONE DETECTED   Barbiturates NONE DETECTED NONE DETECTED    Comment: (NOTE) DRUG SCREEN FOR MEDICAL PURPOSES ONLY.  IF CONFIRMATION IS NEEDED FOR ANY PURPOSE, NOTIFY LAB WITHIN 5 DAYS.  LOWEST DETECTABLE LIMITS FOR URINE DRUG SCREEN Drug Class                     Cutoff (ng/mL) Amphetamine and metabolites    1000 Barbiturate and metabolites    200 Benzodiazepine                 200 Opiates and metabolites        300 Cocaine and metabolites        300 THC                            50 Performed at [facility], [address], [place], [place] [address]   Pregnancy, urine     Status: None   Collection Time: 09/30/22 12:32 PM  Result Value Ref Range   Preg Test, Ur NEGATIVE NEGATIVE    Comment:        THE SENSITIVITY OF THIS METHODOLOGY IS >20 mIU/mL. Performed at [facility], [address], [place], [place] [address]     Current Facility-Administered Medications  Medication Dose Route Frequency Provider Last Rate Last Admin   OLANZapine zydis (ZYPREXA) disintegrating tablet 5 mg  5 mg Oral Q8H PRN Motley-Mangrum, Ebb Carelock A, PMHNP   5 mg at 09/30/22 1616   And   LORazepam (ATIVAN) tablet 1 mg  1 mg Oral PRN Motley-Mangrum, Josyah Achor A, PMHNP       And   ziprasidone (GEODON) injection 20 mg  20 mg Intramuscular PRN Motley-Mangrum, Ezra Sites, PMHNP       Current Outpatient Medications  Medication  Sig Dispense Refill   albuterol (PROVENTIL HFA;VENTOLIN HFA) 108 (90 BASE) MCG/ACT inhaler Inhale 2 puffs into the lungs every 6 (six) hours as needed for  wheezing or shortness of breath.      albuterol (PROVENTIL) (2.5 MG/3ML) 0.083% nebulizer solution Take 2.5 mg by nebulization every 6 (six) hours as needed for wheezing or shortness of breath.     amitriptyline (ELAVIL) 25 MG tablet TAKE 4 TABLETS (100 MG TOTAL) BY MOUTH AT BEDTIME. MUST BE SEEN FOR FURTHER REFILLS. (Patient taking differently: Take 100 mg by mouth at bedtime.) 120 tablet 0   cyanocobalamin (VITAMIN B12) 1000 MCG/ML injection Inject 1 mL (1,000 mcg total) into the muscle every 28 (twenty-eight) days. 1 mL 2   ferrous sulfate 325 (65 FE) MG tablet Take 1 tablet (325 mg total) by mouth 2 (two) times daily with a meal. 60 tablet 1   montelukast (SINGULAIR) 10 MG tablet Take 10 mg by mouth at bedtime.     MOVANTIK 25 MG TABS tablet Take 25 mg by mouth at bedtime.     ondansetron (ZOFRAN ODT) 4 MG disintegrating tablet Take 1 tablet (4 mg total) by mouth every 8 (eight) hours as needed for nausea or vomiting. 20 tablet 0   Oxycodone HCl 10 MG TABS Take 10 mg by mouth 4 (four) times daily as needed (for pain).     pantoprazole (PROTONIX) 40 MG tablet Take 40 mg by mouth daily.     tiZANidine (ZANAFLEX) 4 MG tablet TAKE 1 OR 2 TABLETS BY MOUTH EVERY 8 HOURS AS NEEDED FOR MUSCLE SPASM (NEED OFFICE VISIT) (Patient taking differently: Take 4-8 mg by mouth every 8 (eight) hours as needed for muscle spasms.) 360 tablet 1   topiramate (TOPAMAX) 100 MG tablet Take 1 tablet (100 mg total) by mouth at bedtime. 90 tablet 4   XTAMPZA ER 13.5 MG C12A Take 13.5 mg by mouth every 12 (twelve) hours.      Musculoskeletal: Strength & Muscle Tone: within normal limits Gait & Station: normal Patient leans: N/A   Psychiatric Specialty Exam: Presentation  General Appearance:  Appropriate for Environment; Bizarre  Eye Contact: Minimal  Speech: Pressured  Speech Volume: Increased  Handedness: Right   Mood and Affect  Mood: Anxious  Affect: Full Range   Thought Process  Thought  Processes: Disorganized  Descriptions of Associations:Loose  Orientation:Partial  Thought Content:Delusions; Scattered  History of Schizophrenia/Schizoaffective disorder:No  Duration of Psychotic Symptoms:Greater than six months  Hallucinations:Hallucinations: None  Ideas of Reference:Delusions  Suicidal Thoughts:Suicidal Thoughts: No  Homicidal Thoughts:Homicidal Thoughts: No   Sensorium  Memory: Immediate Fair; Recent Fair  Judgment: Impaired  Insight: Lacking   Executive Functions  Concentration: Poor  Attention Span: Poor  Recall: Poor  Fund of Knowledge: Poor  Language: Fair   Psychomotor Activity  Psychomotor Activity: Psychomotor Activity: Restlessness   Assets  Assets: Communication Skills; Desire for Improvement; Social Support; Housing    Sleep  Sleep: Sleep: Poor   Physical Exam: Physical Exam Vitals and nursing note reviewed. Exam conducted with a chaperone present.  Cardiovascular:     Rate and Rhythm: Normal rate.  Musculoskeletal:        General: Normal range of motion.  Neurological:     Mental Status: She is alert.  Psychiatric:        Attention and Perception: She is inattentive. She perceives visual hallucinations.        Mood and Affect: Mood is anxious. Affect is inappropriate.  Speech: Speech is rapid and pressured.        Behavior: Behavior is agitated and hyperactive.        Thought Content: Thought content is delusional.        Judgment: Judgment is impulsive and inappropriate.    Review of Systems  Psychiatric/Behavioral:  Positive for hallucinations and substance abuse.    Blood pressure 124/88, pulse (!) 102, temperature 99.3 F (37.4 C), temperature source Oral, resp. rate (!) 26, height 5\' 4"  (1.626 m), weight 82 kg, SpO2 95 %. Body mass index is 31.03 kg/m.   Medical Decision Making: Pt case reviewed and discussed with Dr. Viviano Simas. Pt does meet criteria for IVC and inpatient psychiatric  treatment. [facility] and CSW notified of disposition. EDP, RN, and LCSW notified of disposition.    Disposition: Recommend psychiatric Inpatient admission.   Alona Bene, PMHNP 09/30/2022 5:52 PM

## 2022-09-30 NOTE — ED Provider Notes (Signed)
[place] EMERGENCY DEPARTMENT AT [facility] Provider Note   CSN: [id number] Arrival date & time: 09/30/22  1008     History  Chief Complaint  Patient presents with   Psychiatric Evaluation    Cindy Hoffman is a 47 y.o. female with history of asthma, obesity, thyroid disease, fibromyalgia, GERD, sleep apnea, multiple sclerosis, hallucinations who presents to the emergency department complaining of visual and auditory hallucinations.  She reports "I lost my soul a few days ago, and I want to prevent that from happening again.  I flipped and happened a few times.  [place] go [place]."  Adamantly denies suicidal and homicidal ideation.  States that she has asthma, and has been as of recently. No fever.  HPI     Home Medications Prior to Admission medications   Medication Sig Start Date End Date Taking? Authorizing Provider  albuterol (PROVENTIL HFA;VENTOLIN HFA) 108 (90 BASE) MCG/ACT inhaler Inhale 2 puffs into the lungs every 6 (six) hours as needed for wheezing or shortness of breath.     [provider]  albuterol (PROVENTIL) (2.5 MG/3ML) 0.083% nebulizer solution Take 2.5 mg by nebulization every 6 (six) hours as needed for wheezing or shortness of breath.    [provider]  amitriptyline (ELAVIL) 25 MG tablet TAKE 4 TABLETS (100 MG TOTAL) BY MOUTH AT BEDTIME. MUST BE SEEN FOR FURTHER REFILLS. 08/02/22   Anson Fret, MD  cyanocobalamin (VITAMIN B12) 1000 MCG/ML injection Inject 1 mL (1,000 mcg total) into the muscle every 28 (twenty-eight) days. 07/18/22   Leroy Sea, MD  ferrous sulfate 325 (65 FE) MG tablet Take 1 tablet (325 mg total) by mouth 2 (two) times daily with a meal. 07/18/22   Leroy Sea, MD  montelukast (SINGULAIR) 10 MG tablet Take 10 mg by mouth at bedtime. 03/06/21   [provider]  MOVANTIK 25 MG TABS tablet Take 25 mg by mouth at bedtime. 04/12/19   [provider]   ondansetron (ZOFRAN ODT) 4 MG disintegrating tablet Take 1 tablet (4 mg total) by mouth every 8 (eight) hours as needed for nausea or vomiting. 01/06/21   Jacalyn Lefevre, MD  Oxycodone HCl 10 MG TABS Take 10 mg by mouth 4 (four) times daily as needed (for pain). 11/05/21   [provider]  pantoprazole (PROTONIX) 40 MG tablet Take 40 mg by mouth daily. 04/29/21   [provider]  tiZANidine (ZANAFLEX) 4 MG tablet TAKE 1 OR 2 TABLETS BY MOUTH EVERY 8 HOURS AS NEEDED FOR MUSCLE SPASM (NEED OFFICE VISIT) 07/26/22   Anson Fret, MD  topiramate (TOPAMAX) 100 MG tablet Take 1 tablet (100 mg total) by mouth at bedtime. 01/06/22   Anson Fret, MD  XTAMPZA ER 13.5 MG C12A Take 1 capsule by mouth every 12 (twelve) hours. 11/05/21   [provider]      Allergies    Beta adrenergic blockers, Nsaids, and Tolmetin    Review of Systems   Review of Systems  Respiratory:  Positive for cough and wheezing.   Psychiatric/Behavioral:  Positive for hallucinations.   All other systems reviewed and are negative.   Physical Exam Updated Vital Signs BP (!) 139/91 (BP Location: Left Arm)   Pulse 84   Temp 99.3 F (37.4 C) (Oral)   Resp 18   Ht 5\' 4"  (1.626 m)   Wt 82 kg   SpO2 100%   BMI 31.03  kg/m  Physical Exam Vitals and nursing note reviewed.  Constitutional:      Appearance: Normal appearance.  HENT:     Head: Normocephalic and atraumatic.  Eyes:     Conjunctiva/sclera: Conjunctivae normal.  Cardiovascular:     Rate and Rhythm: Normal rate and regular rhythm.  Pulmonary:     Effort: Pulmonary effort is normal. No respiratory distress.     Breath sounds: Wheezing present.     Comments: Mild expiratory wheezing Abdominal:     General: There is no distension.     Palpations: Abdomen is soft.     Tenderness: There is no abdominal tenderness.  Skin:    General: Skin is warm and dry.     Comments: Partially healing abrasions to the face and neck  Neurological:      General: No focal deficit present.     Mental Status: She is alert.  Psychiatric:        Attention and Perception: She perceives auditory and visual hallucinations.        Mood and Affect: Affect normal. Mood is anxious.        Speech: Speech is rapid and pressured.        Behavior: Behavior is cooperative.        Thought Content: Thought content does not include homicidal or suicidal ideation.     Comments: When asked about chief complaint, reports "[place] and I'm ready to go home"     ED Results / Procedures / Treatments   Labs (all labs ordered are listed, but only abnormal results are displayed) Labs Reviewed  COMPREHENSIVE METABOLIC PANEL - Abnormal; Notable for the following components:      Result Value   Potassium 3.2 (*)    Chloride 95 (*)    Glucose, Bld 116 (*)    Alkaline Phosphatase 147 (*)    All other components within normal limits  CBC WITH DIFFERENTIAL/PLATELET - Abnormal; Notable for the following components:   RBC 5.49 (*)    MCH 25.7 (*)    RDW 23.1 (*)    All other components within normal limits  I-STAT BETA HCG BLOOD, ED ([facility], [facility], [facility] ONLY) - Abnormal; Notable for the following components:   I-stat hCG, quantitative 5.2 (*)    All other components within normal limits  ETHANOL  RAPID URINE DRUG SCREEN, HOSP PERFORMED  PREGNANCY, URINE    EKG None  Radiology No results found.  Procedures Procedures    Medications Ordered in ED Medications  albuterol (VENTOLIN HFA) 108 (90 Base) MCG/ACT inhaler 2 puff (2 puffs Inhalation Given 09/30/22 1235)    ED Course/ Medical Decision Making/ A&P                             Medical Decision Making Amount and/or Complexity of Data Reviewed Labs: ordered.  Risk Prescription drug management.   Patient is a 47 y.o. female  who presents to the emergency department for psychiatric complaint.  Past Medical History: asthma, obesity, thyroid disease, fibromyalgia, GERD, sleep apnea,  multiple sclerosis, hallucinations  Reviewed behavioral health note from 3/15 of this year in which patient presented voluntarily to [facility] behavioral health for a walk-in assessment.  She was complaining about auditory hallucinations.  She is on amitriptyline for migraine headaches, prescription narcotics for MS.  Her mother is history of bipolar disorder and schizophrenia.  She elected to follow-up with outpatient psychiatry for medication management.  Physical Exam: Mildly hypertensive, otherwise normal vital signs.  Resting comfortably in no distress.  Very mild expiratory wheezing to auscultation of the lungs, with normal respiratory effort and normal oxygen saturation.  Admits to visual and auditory hallucinations.  Denies SI or HI.  Labs: Medical clearance labs ordered, with following pertinent results: CBC and CMP grossly unremarkable, mildly decreased potassium to 3.2, negative pregnancy.  Negative ethanol.  Negative UDS.  Cardiac monitoring: EKG obtained and interpreted by attending physician which shows: sinus rhythm, QTC 479  Medications: I ordered medication including albuterol inhaler for wheezing associated with asthma. I have reviewed the patients home medicines and have made adjustments as needed.  Disposition: Patient is otherwise medically cleared at this time pending medical clearance laboratory evaluation. Will consult TTS and appreciate their recommendations. Patient is currently here voluntarily, however I do not believe she demonstrates capacity to make medical decisions. Patient and staff notified if she tries to leave the ER prior to psychiatric evaluation she will meet criteria for involuntary commitment. Paperwork completed with attending physician.   I discussed this case with my attending physician Dr. Elpidio Anis who cosigned this note including patient's presenting symptoms, physical exam, and planned diagnostics and interventions. Attending physician stated  agreement with plan or made changes to plan which were implemented.   Final Clinical Impression(s) / ED Diagnoses Final diagnoses:  Psychosis, unspecified psychosis type (HCC)  Delusions (HCC)    Rx / DC Orders ED Discharge Orders     None      Portions of this report may have been transcribed using voice recognition software. Every effort was made to ensure accuracy; however, inadvertent computerized transcription errors may be present.    Jeanella Flattery 09/30/22 1337    Mardene Sayer, MD 09/30/22 [phone]

## 2022-09-30 NOTE — Plan of Care (Addendum)
Addendum:  Per EDP Message "She is completely back to baseline now. She can give me history. She said that she missed several doses of seizure meds and adamant that she doesn't drink alcohol all the time"  I agree that a brief period of observation and resumption of her home medications is appropriate, with psychiatric evaluation as had been requested by the patient  ------------------------------------  Question: Need for antiseizure medications?  Recommendations: -Continue home topiramate, gabapentin and baclofen at the doses she has been taking these medications (will require further chart review/dispense report review and collateral from patient/family to determine dosing) -Would manage this single generalized tonic-clonic event as possible substance withdrawal seizure at this time (CIWA protocol, benzodiazepines and/or phenobarb) -Monitor electrolytes and replete as needed (magnesium added on), potassium mildly low at 3.2 -Given EEG and neurological consultation is more easily obtained [facility], it may be prudent to admit her there for observation in case her clinical course warrants further workup, but at this time I do not see a clear need for EEG or full inpatient neurology consultation based on the information provided thus far -If patient is not returning to baseline, has further concern for seizure activity or other neurological questions arise, please reach out to neurology  These are curbside recommendations based upon the information readily available in the chart on brief review as well as history and examination information provided to me by requesting provider and do not replace a full detailed consult  Cindy Hoffman is a 47 year old woman with MRI brain abnormalities of unclear etiology (potentially CNS demyelinating disease, versus some concern for prior autoimmune encephalitis, follows at [facility]), known psychosis, migraine headaches (on Emgality, Botox and  topiramate), fibromyalgia, obesity, sleep apnea, alcohol abuse, opiate abuse, seizures  She sees Dr. Ronalee Red at [facility] who notes that she has had multiple hospitalizations and has chronic hallucinations.  She was treated with a one-time dose of rituximab 1000 mg on 08 April 2021 but subsequently missed her follow-up on 01 July 2021, presenting again for follow-up with Dr. Ronalee Red on 02/26/2022 at which time he noted that this trial of rituximab did not seem to significantly change her neurologic symptoms making autoimmune encephalitis less likely in his opinion but not excluding the possibility of an underlying demyelinating disease, and noting that her neurocognitive challenges are likely multifactorial given her comorbidities.  There was a plan for neurocognitive testing and repeat MRI brain in 5 months (approximately 07/2022)  She did have a repeat MRI brain 07/17/2022 which demonstrated no change in her white matter lesions  Today she again presents with hallucinations, was witnessed to have seizure activity and I was curbsided for recommendations regarding antiseizure medications.  She does have a history of alcohol withdrawal seizures listed on her chart from 2005.  Additionally the dose of topiramate that she takes for her migraine headaches would also be effective for seizures (was 200 mg nightly at 1 time, reduced to 100 mg nightly in September)  And on chart review she self discontinued her gabapentin in October 2023 (900 3 times daily) due to concern it was contributing to her cognitive slowing  She also has baclofen 10 mg 3 times daily listed on her medication list, withdrawal from baclofen can result in seizure activity  Basic Metabolic Panel: Recent Labs  Lab 09/30/22 1136  NA 135  K 3.2*  CL 95*  CO2 26  GLUCOSE 116*  BUN 7  CREATININE 0.45  CALCIUM 8.9    CBC: Recent Labs  Lab 09/30/22 1136  WBC 6.6  NEUTROABS 4.2  HGB 14.1  HCT 44.9  MCV 81.8  PLT 256     Coagulation Studies: No results for input(s): "LABPROT", "INR" in the last 72 hours.   Brooke Dare MD-PhD [facility] [phone] Available 7 AM to 7 PM, outside these hours please contact Neurologist on call listed on AMION

## 2022-09-30 NOTE — ED Notes (Addendum)
Patient has been having visual and auditory hallucinations. Patient was standing in the hallway looking up at the fire alarm, yelling at it and pointing. Then she began spinning around quickly and fell. Once patient was on the floor she began to have a seizure. Staff immediately rolled patient on her side and called for more assistance and a stretcher. Patient was making gurgling sounds.

## 2022-09-30 NOTE — ED Notes (Signed)
Pt has been wanded by security and bag of belongings have been taken to locker 35 in [place]

## 2022-09-30 NOTE — ED Triage Notes (Addendum)
Pt BIB EMS with visual and auditory hallucinations. Denies SI/HI. "I lost my soul a few days ago, I've lived in heaven a few times"

## 2022-09-30 NOTE — ED Notes (Signed)
Pt is resting in bed at this time 

## 2022-10-01 DIAGNOSIS — F29 Unspecified psychosis not due to a substance or known physiological condition: Secondary | ICD-10-CM

## 2022-10-01 DIAGNOSIS — F22 Delusional disorders: Secondary | ICD-10-CM | POA: Diagnosis not present

## 2022-10-01 MED ORDER — OLANZAPINE 5 MG PO TBDP
5.0000 mg | ORAL_TABLET | Freq: Every day | ORAL | Status: DC
  Administered 2022-10-01 – 2022-10-02 (×2): 5 mg via ORAL
  Filled 2022-10-01 (×2): qty 1

## 2022-10-01 MED ORDER — ONDANSETRON 4 MG PO TBDP
4.0000 mg | ORAL_TABLET | Freq: Three times a day (TID) | ORAL | Status: DC | PRN
  Administered 2022-10-01 – 2022-10-02 (×2): 4 mg via ORAL
  Filled 2022-10-01 (×2): qty 1

## 2022-10-01 MED ORDER — ALBUTEROL SULFATE HFA 108 (90 BASE) MCG/ACT IN AERS
2.0000 | INHALATION_SPRAY | RESPIRATORY_TRACT | Status: DC | PRN
  Administered 2022-10-01 – 2022-10-03 (×3): 2 via RESPIRATORY_TRACT
  Filled 2022-10-01 (×3): qty 6.7

## 2022-10-01 MED ORDER — HYDROXYZINE HCL 25 MG PO TABS: 25.0000 mg | ORAL_TABLET | Freq: Three times a day (TID) | ORAL | Status: DC | PRN

## 2022-10-01 MED ORDER — ESCITALOPRAM OXALATE 10 MG PO TABS
10.0000 mg | ORAL_TABLET | Freq: Every day | ORAL | Status: DC
  Administered 2022-10-01 – 2022-10-03 (×3): 10 mg via ORAL
  Filled 2022-10-01 (×3): qty 1

## 2022-10-01 NOTE — Progress Notes (Signed)
Pt meets inpatient behavioral health criteria per Alona Bene, PMHNP. This Psych Disposition CSW has requested [facility] Fransico Michael, RN to review.  Maryjean Ka, MSW, [facility] 10/01/2022 11:28 PM

## 2022-10-01 NOTE — ED Provider Notes (Signed)
Emergency Medicine Observation Re-evaluation Note  Cindy Hoffman is a 47 y.o. female, seen on rounds today.  Pt initially presented to the ED for complaints of Psychiatric Evaluation Currently, the patient is calm, no distress. No new c/o this AM.   Physical Exam  BP 123/86 (BP Location: Left Arm)   Pulse 78   Temp (!) 97.5 F (36.4 C) (Oral)   Resp 18   Ht 1.626 m (5\' 4" )   Wt 82 kg   SpO2 100%   BMI 31.03 kg/m  Physical Exam General: calm, nad.  Cardiac: regular rate.  Lungs: breathing comfortably. Psych: calm.  ED Course / MDM    I have reviewed the labs performed to date as well as medications administered while in observation.  Recent changes in the last 24 hours include ED obs, med management, reassessment.   Plan  The patient has been placed in psychiatric observation due to the need to provide a safe environment for the patient while obtaining psychiatric consultation and evaluation, as well as ongoing medical and medication management to treat the patient's condition.    BH reassessment pending. Current recommendation is for inpatient psych tx, placement remains pending.    Cathren Laine, MD 10/01/22 [phone]

## 2022-10-01 NOTE — ED Notes (Signed)
Patient is alert this shift.  Patient ate breakfast. Patient disorganized.  Patient continues to think she leaving with grandparents .   Patient hallucinating and talking to self.

## 2022-10-01 NOTE — Progress Notes (Signed)
[facility] Psych ED Progress Note  10/01/2022 4:41 PM [name]  MRN:  [id number]   Principal Problem: Altered mental status Diagnosis:  Principal Problem:   Altered mental status Active Problems:   Hallucinations   ED Assessment Time Calculation: Start Time: 1140 Stop Time: 1155 Total Time in Minutes (Assessment Completion): 15   Subjective: Cindy Hoffman, 47 y.o., female patient seen face to face by this provider, consulted with Dr. Viviano Simas; and chart reviewed on 10/01/22.  On evaluation Cindy Hoffman jumps out of her bed when provider walks in and says, "I need to be discharged, my grandparents are here and they are about to sign me and take me home ".  Provider asked if we could talk first, she stated "you better make a quick, because they are coming through the door ".  Patient denies SI/HI/AVH, states that she is ready to go home, that she will not be living back with her father Remi Deter "because he does not believe anything I say."   She then states "I do not want to return to my dad's home because the demons and angels are there."  No command hallucinations reported.  She denies suicidal and homicidal ideations.  History of 1 suicide attempt at age 84 when she intentionally overdosed on Tylenol. She states she will now be going to live with her grandparents. She is alert, oriented x 3, patient is excited and delusional at this time. Her mood is hyper/ anxious with congruent affect. She has normal speech, and restless behavior.  She denies suicidal/self-harm/homicidal ideation, psychosis, and paranoia.  Patient continues to be delusional, as she felt she saw her grandparents walk through the door, and continues to make statements that God is her father, and she feels that if her grandparents can not come and get her, God will get her out of here.      [place] Scale:  Flowsheet Row ED from 09/30/2022 in [facility] Emergency Department at [facility] ED to Hosp-Admission  (Discharged) from 07/17/2022 in [facility] 28M KIDNEY UNIT ED from 07/16/2022 in [facility]  C-SSRS RISK CATEGORY No Risk No Risk No Risk       Past Medical History:  Past Medical History:  Diagnosis Date   Anxiety    Arthritis    knees   Asthma    Autoimmune encephalitis    Chronic pain syndrome    Complication of anesthesia    "STATES WAKES UP AND CANT BREATHE AND NEEDS BREATHING TREATMENT  IN PACU"   Constipation due to opioid therapy    Dysrhythmia    palpitations- "from thyroid"   Fibromyalgia    Fracture of right foot    Gastric ulcer 2018   GERD (gastroesophageal reflux disease)    History of kidney stones    "inbedded"   Migraine    Mirgraine-   Multiple sclerosis (HCC)    Myofacial muscle pain    Obesity    Occipital neuralgia 2016   OSA (obstructive sleep apnea)    Has CPAP   Sleep apnea     NO CPAP--could not tolerates mask   Thyroid disease    Viral respiratory infection 06/26/2013   ? influenza    Past Surgical History:  Procedure Laterality Date   COLONOSCOPY      x2   ENDOMETRIAL ABLATION     ENDOMETRIAL ABLATION  07/17/2017   GALLBLADDER SURGERY  06/2016   GASTRIC ROUX-EN-Y  05/2016   HIP  ARTHROSCOPY Right 05/10/2019   Procedure: Right hip arthroscopy with labrum repair and  femoroplasty;  Surgeon: Yolonda Kida, MD;  Location: [facility] OR;  Service: Orthopedics;  Laterality: Right;  2.5 hrs   NASAL SEPTUM SURGERY     THYROID LOBECTOMY Right 07/19/2013   Procedure: THYROID LOBECTOMY;  Surgeon: Shelly Rubenstein, MD;  Location: [facility] ORS;  Service: General;  Laterality: Right;   TUBAL LIGATION     WISDOM TOOTH EXTRACTION     Family History:  Family History  Problem Relation Age of Onset   Hypertension Mother    Hypertension Father    Prostate cancer Father    Diabetes Sister    Cancer Paternal Grandmother        breast   Social History:  Social History   Substance and Sexual Activity  Alcohol Use Yes    Alcohol/week: 2.0 standard drinks of alcohol   Types: 2 Glasses of wine per week     Social History   Substance and Sexual Activity  Drug Use No    Social History   Socioeconomic History   Marital status: Divorced    Spouse name: Not on file   Number of children: 1   Years of education: GED   Highest education level: Not on file  Occupational History   Occupation: unemployed  Tobacco Use   Smoking status: Every Day    Packs/day: 0.50    Years: 0.00    Additional pack years: 0.00    Total pack years: 0.00    Types: Cigarettes    Last attempt to quit: 11/2015    Years since quitting: 6.9   Smokeless tobacco: Never   Tobacco comments:    1-2 cigs per day  Vaping Use   Vaping Use: Never used  Substance and Sexual Activity   Alcohol use: Yes    Alcohol/week: 2.0 standard drinks of alcohol    Types: 2 Glasses of wine per week   Drug use: No   Sexual activity: Not on file  Other Topics Concern   Not on file  Social History Narrative   Lives with father   Caffeine use: rare   Right handed   Social Determinants of Health   Financial Resource Strain: Not on file  Food Insecurity: No Food Insecurity (07/18/2022)   Hunger Vital Sign    Worried About Running Out of Food in the Last Year: Never true    Ran Out of Food in the Last Year: Never true  Transportation Needs: No Transportation Needs (07/18/2022)   PRAPARE - Administrator, Civil Service (Medical): No    Lack of Transportation (Non-Medical): No  Physical Activity: Not on file  Stress: Not on file  Social Connections: Not on file    Sleep: Poor  Appetite:  Fair  Current Medications: Current Facility-Administered Medications  Medication Dose Route Frequency Provider Last Rate Last Admin   amitriptyline (ELAVIL) tablet 100 mg  100 mg Oral QHS Charlynne Pander, MD   100 mg at 09/30/22 2221   baclofen (LIORESAL) tablet 10 mg  10 mg Oral TID PRN Charlynne Pander, MD       OLANZapine zydis  [facility] disintegrating tablet 5 mg  5 mg Oral Q8H PRN Motley-Mangrum, Sorin Frimpong A, PMHNP   5 mg at 09/30/22 1616   And   LORazepam (ATIVAN) tablet 1 mg  1 mg Oral PRN Motley-Mangrum, Shadman Tozzi A, PMHNP       And   ziprasidone (GEODON) injection  20 mg  20 mg Intramuscular PRN Motley-Mangrum, Lash Matulich A, PMHNP       naloxegol oxalate (MOVANTIK) tablet 25 mg  25 mg Oral QHS Charlynne Pander, MD   25 mg at 09/30/22 2221   ondansetron (ZOFRAN-ODT) disintegrating tablet 4 mg  4 mg Oral Q8H PRN Zadie Rhine, MD   4 mg at 10/01/22 1610   oxyCODONE (Oxy IR/ROXICODONE) immediate release tablet 10 mg  10 mg Oral QID PRN Charlynne Pander, MD   10 mg at 10/01/22 1519   topiramate (TOPAMAX) tablet 100 mg  100 mg Oral QHS Charlynne Pander, MD   100 mg at 09/30/22 2225   Current Outpatient Medications  Medication Sig Dispense Refill   albuterol (PROVENTIL HFA;VENTOLIN HFA) 108 (90 BASE) MCG/ACT inhaler Inhale 2 puffs into the lungs every 6 (six) hours as needed for wheezing or shortness of breath.      albuterol (PROVENTIL) (2.5 MG/3ML) 0.083% nebulizer solution Take 2.5 mg by nebulization every 6 (six) hours as needed for wheezing or shortness of breath.     amitriptyline (ELAVIL) 25 MG tablet TAKE 4 TABLETS (100 MG TOTAL) BY MOUTH AT BEDTIME. MUST BE SEEN FOR FURTHER REFILLS. (Patient taking differently: Take 100 mg by mouth at bedtime.) 120 tablet 0   cyanocobalamin (VITAMIN B12) 1000 MCG/ML injection Inject 1 mL (1,000 mcg total) into the muscle every 28 (twenty-eight) days. 1 mL 2   ferrous sulfate 325 (65 FE) MG tablet Take 1 tablet (325 mg total) by mouth 2 (two) times daily with a meal. 60 tablet 1   montelukast (SINGULAIR) 10 MG tablet Take 10 mg by mouth at bedtime.     MOVANTIK 25 MG TABS tablet Take 25 mg by mouth at bedtime.     ondansetron (ZOFRAN ODT) 4 MG disintegrating tablet Take 1 tablet (4 mg total) by mouth every 8 (eight) hours as needed for nausea or vomiting. 20 tablet 0   Oxycodone  HCl 10 MG TABS Take 10 mg by mouth 4 (four) times daily as needed (for pain).     pantoprazole (PROTONIX) 40 MG tablet Take 40 mg by mouth daily.     tiZANidine (ZANAFLEX) 4 MG tablet TAKE 1 OR 2 TABLETS BY MOUTH EVERY 8 HOURS AS NEEDED FOR MUSCLE SPASM (NEED OFFICE VISIT) (Patient taking differently: Take 4-8 mg by mouth every 8 (eight) hours as needed for muscle spasms.) 360 tablet 1   topiramate (TOPAMAX) 100 MG tablet Take 1 tablet (100 mg total) by mouth at bedtime. 90 tablet 4   XTAMPZA ER 13.5 MG C12A Take 13.5 mg by mouth every 12 (twelve) hours.      Lab Results:  Results for orders placed or performed during the hospital encounter of 09/30/22 (from the past 48 hour(s))  Comprehensive metabolic panel     Status: Abnormal   Collection Time: 09/30/22 11:36 AM  Result Value Ref Range   Sodium 135 135 - 145 mmol/L   Potassium 3.2 (L) 3.5 - 5.1 mmol/L   Chloride 95 (L) 98 - 111 mmol/L   CO2 26 22 - 32 mmol/L   Glucose, Bld 116 (H) 70 - 99 mg/dL    Comment: Glucose reference range applies only to samples taken after fasting for at least 8 hours.   BUN 7 6 - 20 mg/dL   Creatinine, Ser 9.60 0.44 - 1.00 mg/dL   Calcium 8.9 8.9 - 45.4 mg/dL   Total Protein 7.8 6.5 - 8.1 g/dL   Albumin 3.8 3.5 -  5.0 g/dL   AST 26 15 - 41 U/L   ALT 19 0 - 44 U/L   Alkaline Phosphatase 147 (H) 38 - 126 U/L   Total Bilirubin 0.8 0.3 - 1.2 mg/dL   GFR, Estimated >46 >96 mL/min    Comment: (NOTE) Calculated using the CKD-EPI Creatinine Equation (2021)    Anion gap 14 5 - 15    Comment: Performed at [facility], [address] [place], [place] [address]  Ethanol     Status: None   Collection Time: 09/30/22 11:36 AM  Result Value Ref Range   Alcohol, Ethyl (B) <10 <10 mg/dL    Comment: (NOTE) Lowest detectable limit for serum alcohol is 10 mg/dL.  For medical purposes only. Performed at [facility], [address] [place], [place] [address]   CBC with Diff      Status: Abnormal   Collection Time: 09/30/22 11:36 AM  Result Value Ref Range   WBC 6.6 4.0 - 10.5 K/uL   RBC 5.49 (H) 3.87 - 5.11 MIL/uL   Hemoglobin 14.1 12.0 - 15.0 g/dL   HCT 40.1 02.7 - 25.3 %   MCV 81.8 80.0 - 100.0 fL   MCH 25.7 (L) 26.0 - 34.0 pg   MCHC 31.4 30.0 - 36.0 g/dL   RDW 66.4 (H) 40.3 - 47.4 %   Platelets 256 150 - 400 K/uL   nRBC 0.0 0.0 - 0.2 %   Neutrophils Relative % 64 %   Neutro Abs 4.2 1.7 - 7.7 K/uL   Lymphocytes Relative 23 %   Lymphs Abs 1.5 0.7 - 4.0 K/uL   Monocytes Relative 11 %   Monocytes Absolute 0.7 0.1 - 1.0 K/uL   Eosinophils Relative 1 %   Eosinophils Absolute 0.1 0.0 - 0.5 K/uL   Basophils Relative 1 %   Basophils Absolute 0.1 0.0 - 0.1 K/uL   Immature Granulocytes 0 %   Abs Immature Granulocytes 0.02 0.00 - 0.07 K/uL    Comment: Performed at [facility], [address], [place], [place] [address]  Magnesium     Status: None   Collection Time: 09/30/22 11:36 AM  Result Value Ref Range   Magnesium 2.3 1.7 - 2.4 mg/dL    Comment: Performed at [facility], [address] [place], [place] [address]  I-Stat beta hCG blood, ED     Status: Abnormal   Collection Time: 09/30/22 12:01 PM  Result Value Ref Range   I-stat hCG, quantitative 5.2 (H) <5 mIU/mL   Comment 3            Comment:   GEST. AGE      CONC.  (mIU/mL)   <=1 WEEK        5 - 50     2 WEEKS       50 - 500     3 WEEKS       100 - 10,000     4 WEEKS     1,000 - 30,000        FEMALE AND NON-PREGNANT FEMALE:     LESS THAN 5 mIU/mL   Urine rapid drug screen (hosp performed)     Status: None   Collection Time: 09/30/22 12:32 PM  Result Value Ref Range   Opiates NONE DETECTED NONE DETECTED   Cocaine NONE DETECTED NONE DETECTED   Benzodiazepines NONE DETECTED NONE DETECTED   Amphetamines NONE DETECTED NONE DETECTED   Tetrahydrocannabinol NONE DETECTED NONE  DETECTED   Barbiturates NONE DETECTED NONE DETECTED    Comment: (NOTE) DRUG  SCREEN FOR MEDICAL PURPOSES ONLY.  IF CONFIRMATION IS NEEDED FOR ANY PURPOSE, NOTIFY LAB WITHIN 5 DAYS.  LOWEST DETECTABLE LIMITS FOR URINE DRUG SCREEN Drug Class                     Cutoff (ng/mL) Amphetamine and metabolites    1000 Barbiturate and metabolites    200 Benzodiazepine                 200 Opiates and metabolites        300 Cocaine and metabolites        300 THC                            50 Performed at [facility], [address], [place], [place] [address]   Pregnancy, urine     Status: None   Collection Time: 09/30/22 12:32 PM  Result Value Ref Range   Preg Test, Ur NEGATIVE NEGATIVE    Comment:        THE SENSITIVITY OF THIS METHODOLOGY IS >20 mIU/mL. Performed at [facility], [address], [place], [place] [address]     Blood Alcohol level:  Lab Results  Component Value Date   [facility] <10 09/30/2022   ETH <10 04/17/2021    Physical Findings:  CIWA:    COWS:     Musculoskeletal: Strength & Muscle Tone: within normal limits Gait & Station: normal Patient leans: N/A  Psychiatric Specialty Exam:  Presentation  General Appearance:  Appropriate for Environment; Bizarre  Eye Contact: Minimal  Speech: Pressured  Speech Volume: Normal  Handedness: Right   Mood and Affect  Mood: Anxious  Affect: Full Range   Thought Process  Thought Processes: Disorganized  Descriptions of Associations:Loose  Orientation:Partial  Thought Content:Delusions  History of Schizophrenia/Schizoaffective disorder:No  Duration of Psychotic Symptoms:N/A  Hallucinations:Hallucinations: None  Ideas of Reference:Delusions  Suicidal Thoughts:Suicidal Thoughts: No  Homicidal Thoughts:Homicidal Thoughts: No   Sensorium  Memory: Immediate Fair; Recent Fair  Judgment: Impaired  Insight: Lacking   Executive Functions  Concentration: Poor  Attention Span: Poor  Recall: Poor  Fund of  Knowledge: Poor  Language: Fair   Psychomotor Activity  Psychomotor Activity: Psychomotor Activity: Restlessness   Assets  Assets: Communication Skills; Social Support; Housing   Sleep  Sleep: Sleep: Fair    Physical Exam: Physical Exam Vitals and nursing note reviewed. Exam conducted with a chaperone present.  Neurological:     Mental Status: She is alert.  Psychiatric:        Attention and Perception: She is inattentive.        Mood and Affect: Mood is anxious. Affect is inappropriate.        Speech: Speech is rapid and pressured.        Behavior: Behavior is hyperactive.        Thought Content: Thought content is delusional.        Judgment: Judgment is inappropriate.    Review of Systems  Constitutional: Negative.   Psychiatric/Behavioral:  Positive for hallucinations and substance abuse. The patient is nervous/anxious.    Blood pressure (!) 122/99, pulse 95, temperature (!) 97.3 F (36.3 C), temperature source Oral, resp. rate 18, height 5\' 4"  (1.626 m), weight 82 kg, SpO2 93 %. Body mass index is 31.03 kg/m.    Medical Decision Making:  Patient continues to require inpatient Psychiatric hospitalization. Will restart patient on lexapro 10 mg PO daily for depression. Will start her on Zyprexa 5 mg @ HS for mood stability. Patient has had significant decompensation due to medication noncompliance. Patient continues to meet criteria for IVC and inpatient psychiatric treatment.  EKG Qtc 479 on admission   Prolonged QTc- will repeat EKG on 10/03/22, and continue Zyprexa for mood lability and agitation. Recommend avoiding any additional QTc prolonging agents at this time.  Patient has been faxed out to multiple inpatient psychiatric facilities.    Bacilio Abascal MOTLEY-MANGRUM, PMHNP 10/01/2022, 4:41 PM

## 2022-10-01 NOTE — ED Notes (Signed)
Call from [place] at [facility] who states Cindy Hoffman is currently under review for placement at their hospital.

## 2022-10-02 ENCOUNTER — Encounter (HOSPITAL_COMMUNITY): Payer: Self-pay | Admitting: Psychiatry

## 2022-10-02 DIAGNOSIS — F22 Delusional disorders: Secondary | ICD-10-CM | POA: Diagnosis not present

## 2022-10-02 DIAGNOSIS — F29 Unspecified psychosis not due to a substance or known physiological condition: Secondary | ICD-10-CM | POA: Diagnosis present

## 2022-10-02 MED ORDER — PROMETHAZINE HCL 25 MG PO TABS
25.0000 mg | ORAL_TABLET | Freq: Once | ORAL | Status: AC
  Administered 2022-10-02: 25 mg via ORAL
  Filled 2022-10-02: qty 1

## 2022-10-02 NOTE — ED Provider Notes (Signed)
Emergency Medicine Observation Re-evaluation Note  Cindy Hoffman is a 47 y.o. female, seen on rounds today.  Pt initially presented to the ED for complaints of Psychiatric Evaluation Currently, the patient is resting.  Physical Exam  BP 125/71 (BP Location: Right Arm)   Pulse 80   Temp (!) 97.5 F (36.4 C) (Oral)   Resp 16   Ht 5\' 4"  (1.626 m)   Wt 82 kg   SpO2 100%   BMI 31.03 kg/m  Physical Exam General: NAD   ED Course / MDM  EKG:EKG Interpretation  Date/Time:  Thursday Sep 30 2022 10:46:13 EDT Ventricular Rate:  97 PR Interval:  151 QRS Duration: 101 QT Interval:  377 QTC Calculation: 479 R Axis:   75 Text Interpretation: Sinus rhythm Abnormal inferior Q waves No significant change since last tracing Confirmed by Richardean Canal [phone]) on 09/30/2022 5:33:09 PM  I have reviewed the labs performed to date as well as medications administered while in observation.  Recent changes in the last 24 hours include no acute events reported.  Plan  Current plan is for placement.    Wynetta Fines, MD 10/02/22 [phone]

## 2022-10-02 NOTE — Progress Notes (Addendum)
[facility] Psych ED Progress Note  10/02/2022 10:52 AM Cindy Hoffman  MRN:  [id number]   Subjective: Cindy Hoffman is a 47 year old Caucasian female with a past psychiatric history of alcohol abuse/intoxication, altered mental status, hallucinations, anxiety, and unspecified psychosis, with multiple medical comorbidities, who presented this encounter for psychiatric complaint of auditory visual hallucinations.  Patient was brought in by EMS.   Patient seen today face-to-face by this provider at [facility] for psychiatric reevaluation.  Upon reevaluation, patient presents with atypical interpersonal style, but appreciably presents with no overt delusional themes, expression of paranoid ideations, auditory visual hallucinations (denies and does not appear to be RTIS), and is attentive during our engagement and able to provide substantive and meaningful interview.  Patient reports that she does not recall why she presented to the hospital, but that she feels like she is ready to go, shares descriptively that she does not need to be at the hospital and she can return home.  Patient orientation is appreciably intact upon assessment.  Patient reports no suicidal or homicidal ideations.  Patient reports that medications started and restarted have been helpful, no appreciable side effects.  Discussed plans with patient around safety and returning home to live with father again that patient was amenable to.  Patient asserts that she does have a history of problems with EtOH, but that they are not as bad as father upon collateral has described, where father endorses that the patient is severely abusing EtOH.  Discussed patient's blood alcohol level upon presentation to the emergency department which was notably negative, patient also asserts that she has not been drinking as much as father states on collateral.  Patient verbalizes that she believes that she has mental illness, and that she is amenable to outpatient  resources for mental health, though cannot necessarily articulate outside of problems with her mood what those exact mental health problems are.  Collateral (father) x 2 No answer   Principal Problem: Unspecified psychosis not due to a substance or known physiological condition (HCC) Diagnosis:  Principal Problem:   Unspecified psychosis not due to a substance or known physiological condition (HCC) Active Problems:   Hallucinations   Altered mental status   ED Assessment Time Calculation: Start Time: 1140 Stop Time: 1155 Total Time in Minutes (Assessment Completion): 15   Past Psychiatric History:   [place] Scale:  Flowsheet Row ED from 09/30/2022 in [facility] Emergency Department at [facility] ED to Hosp-Admission (Discharged) from 07/17/2022 in [facility]M KIDNEY UNIT ED from 07/16/2022 in [facility]  C-SSRS RISK CATEGORY No Risk No Risk No Risk       Past Medical History:  Past Medical History:  Diagnosis Date   Anxiety    Arthritis    knees   Asthma    Autoimmune encephalitis    Chronic pain syndrome    Complication of anesthesia    "STATES WAKES UP AND CANT BREATHE AND NEEDS BREATHING TREATMENT  IN PACU"   Constipation due to opioid therapy    Dysrhythmia    palpitations- "from thyroid"   Fibromyalgia    Fracture of right foot    Gastric ulcer 2018   GERD (gastroesophageal reflux disease)    History of kidney stones    "inbedded"   Migraine    Mirgraine-   Multiple sclerosis (HCC)    Myofacial muscle pain    Obesity    Occipital neuralgia 2016   OSA (obstructive sleep apnea)  Has CPAP   Sleep apnea     NO CPAP--could not tolerates mask   Thyroid disease    Viral respiratory infection 06/26/2013   ? influenza    Past Surgical History:  Procedure Laterality Date   COLONOSCOPY      x2   ENDOMETRIAL ABLATION     ENDOMETRIAL ABLATION  07/17/2017   GALLBLADDER SURGERY  06/2016   GASTRIC ROUX-EN-Y   05/2016   HIP ARTHROSCOPY Right 05/10/2019   Procedure: Right hip arthroscopy with labrum repair and  femoroplasty;  Surgeon: Yolonda Kida, MD;  Location: [facility] OR;  Service: Orthopedics;  Laterality: Right;  2.5 hrs   NASAL SEPTUM SURGERY     THYROID LOBECTOMY Right 07/19/2013   Procedure: THYROID LOBECTOMY;  Surgeon: Shelly Rubenstein, MD;  Location: [facility] ORS;  Service: General;  Laterality: Right;   TUBAL LIGATION     WISDOM TOOTH EXTRACTION     Family History:  Family History  Problem Relation Age of Onset   Hypertension Mother    Hypertension Father    Prostate cancer Father    Diabetes Sister    Cancer Paternal Grandmother        breast   Social History:  Social History   Substance and Sexual Activity  Alcohol Use Yes   Alcohol/week: 2.0 standard drinks of alcohol   Types: 2 Glasses of wine per week     Social History   Substance and Sexual Activity  Drug Use No    Social History   Socioeconomic History   Marital status: Divorced    Spouse name: Not on file   Number of children: 1   Years of education: GED   Highest education level: Not on file  Occupational History   Occupation: unemployed  Tobacco Use   Smoking status: Every Day    Packs/day: 0.50    Years: 0.00    Additional pack years: 0.00    Total pack years: 0.00    Types: Cigarettes    Last attempt to quit: 11/2015    Years since quitting: 6.9   Smokeless tobacco: Never   Tobacco comments:    1-2 cigs per day  Vaping Use   Vaping Use: Never used  Substance and Sexual Activity   Alcohol use: Yes    Alcohol/week: 2.0 standard drinks of alcohol    Types: 2 Glasses of wine per week   Drug use: No   Sexual activity: Not on file  Other Topics Concern   Not on file  Social History Narrative   Lives with father   Caffeine use: rare   Right handed   Social Determinants of Health   Financial Resource Strain: Not on file  Food Insecurity: No Food Insecurity (07/18/2022)   Hunger Vital Sign     Worried About Running Out of Food in the Last Year: Never true    Ran Out of Food in the Last Year: Never true  Transportation Needs: No Transportation Needs (07/18/2022)   PRAPARE - Administrator, Civil Service (Medical): No    Lack of Transportation (Non-Medical): No  Physical Activity: Not on file  Stress: Not on file  Social Connections: Not on file    Sleep: Good  Appetite:  Good  Current Medications: Current Facility-Administered Medications  Medication Dose Route Frequency Provider Last Rate Last Admin   albuterol (VENTOLIN HFA) 108 (90 Base) MCG/ACT inhaler 2 puff  2 puff Inhalation Q4H PRN Trifan, Kermit Balo, MD   2  puff at 10/02/22 0916   amitriptyline (ELAVIL) tablet 100 mg  100 mg Oral QHS Charlynne Pander, MD   100 mg at 10/01/22 2126   baclofen (LIORESAL) tablet 10 mg  10 mg Oral TID PRN Charlynne Pander, MD   10 mg at 10/01/22 2126   escitalopram (LEXAPRO) tablet 10 mg  10 mg Oral Daily Motley-Mangrum, Jadeka A, PMHNP   10 mg at 10/02/22 1006   hydrOXYzine (ATARAX) tablet 25 mg  25 mg Oral TID PRN Motley-Mangrum, Jadeka A, PMHNP       LORazepam (ATIVAN) tablet 1 mg  1 mg Oral PRN Motley-Mangrum, Jadeka A, PMHNP       And   ziprasidone (GEODON) injection 20 mg  20 mg Intramuscular PRN Motley-Mangrum, Jadeka A, PMHNP       naloxegol oxalate (MOVANTIK) tablet 25 mg  25 mg Oral QHS Charlynne Pander, MD   25 mg at 10/01/22 2125   OLANZapine zydis (ZYPREXA) disintegrating tablet 5 mg  5 mg Oral QHS Motley-Mangrum, Jadeka A, PMHNP   5 mg at 10/01/22 2126   ondansetron (ZOFRAN-ODT) disintegrating tablet 4 mg  4 mg Oral Q8H PRN Zadie Rhine, MD   4 mg at 10/01/22 1610   oxyCODONE (Oxy IR/ROXICODONE) immediate release tablet 10 mg  10 mg Oral QID PRN Charlynne Pander, MD   10 mg at 10/02/22 0916   topiramate (TOPAMAX) tablet 100 mg  100 mg Oral QHS Charlynne Pander, MD   100 mg at 10/01/22 2126   Current Outpatient Medications  Medication Sig Dispense  Refill   albuterol (PROVENTIL HFA;VENTOLIN HFA) 108 (90 BASE) MCG/ACT inhaler Inhale 2 puffs into the lungs every 6 (six) hours as needed for wheezing or shortness of breath.      albuterol (PROVENTIL) (2.5 MG/3ML) 0.083% nebulizer solution Take 2.5 mg by nebulization every 6 (six) hours as needed for wheezing or shortness of breath.     amitriptyline (ELAVIL) 25 MG tablet TAKE 4 TABLETS (100 MG TOTAL) BY MOUTH AT BEDTIME. MUST BE SEEN FOR FURTHER REFILLS. (Patient taking differently: Take 100 mg by mouth at bedtime.) 120 tablet 0   cyanocobalamin (VITAMIN B12) 1000 MCG/ML injection Inject 1 mL (1,000 mcg total) into the muscle every 28 (twenty-eight) days. 1 mL 2   ferrous sulfate 325 (65 FE) MG tablet Take 1 tablet (325 mg total) by mouth 2 (two) times daily with a meal. 60 tablet 1   montelukast (SINGULAIR) 10 MG tablet Take 10 mg by mouth at bedtime.     MOVANTIK 25 MG TABS tablet Take 25 mg by mouth at bedtime.     ondansetron (ZOFRAN ODT) 4 MG disintegrating tablet Take 1 tablet (4 mg total) by mouth every 8 (eight) hours as needed for nausea or vomiting. 20 tablet 0   Oxycodone HCl 10 MG TABS Take 10 mg by mouth 4 (four) times daily as needed (for pain).     pantoprazole (PROTONIX) 40 MG tablet Take 40 mg by mouth daily.     tiZANidine (ZANAFLEX) 4 MG tablet TAKE 1 OR 2 TABLETS BY MOUTH EVERY 8 HOURS AS NEEDED FOR MUSCLE SPASM (NEED OFFICE VISIT) (Patient taking differently: Take 4-8 mg by mouth every 8 (eight) hours as needed for muscle spasms.) 360 tablet 1   topiramate (TOPAMAX) 100 MG tablet Take 1 tablet (100 mg total) by mouth at bedtime. 90 tablet 4   XTAMPZA ER 13.5 MG C12A Take 13.5 mg by mouth every 12 (twelve) hours.  Lab Results:  Results for orders placed or performed during the hospital encounter of 09/30/22 (from the past 48 hour(s))  Comprehensive metabolic panel     Status: Abnormal   Collection Time: 09/30/22 11:36 AM  Result Value Ref Range   Sodium 135 135 - 145  mmol/L   Potassium 3.2 (L) 3.5 - 5.1 mmol/L   Chloride 95 (L) 98 - 111 mmol/L   CO2 26 22 - 32 mmol/L   Glucose, Bld 116 (H) 70 - 99 mg/dL    Comment: Glucose reference range applies only to samples taken after fasting for at least 8 hours.   BUN 7 6 - 20 mg/dL   Creatinine, Ser 0.98 0.44 - 1.00 mg/dL   Calcium 8.9 8.9 - 11.9 mg/dL   Total Protein 7.8 6.5 - 8.1 g/dL   Albumin 3.8 3.5 - 5.0 g/dL   AST 26 15 - 41 U/L   ALT 19 0 - 44 U/L   Alkaline Phosphatase 147 (H) 38 - 126 U/L   Total Bilirubin 0.8 0.3 - 1.2 mg/dL   GFR, Estimated >14 >78 mL/min    Comment: (NOTE) Calculated using the CKD-EPI Creatinine Equation (2021)    Anion gap 14 5 - 15    Comment: Performed at [facility], [address] [place], [place] [address]  Ethanol     Status: None   Collection Time: 09/30/22 11:36 AM  Result Value Ref Range   Alcohol, Ethyl (B) <10 <10 mg/dL    Comment: (NOTE) Lowest detectable limit for serum alcohol is 10 mg/dL.  For medical purposes only. Performed at [facility], [address] [place], [place] [address]   CBC with Diff     Status: Abnormal   Collection Time: 09/30/22 11:36 AM  Result Value Ref Range   WBC 6.6 4.0 - 10.5 K/uL   RBC 5.49 (H) 3.87 - 5.11 MIL/uL   Hemoglobin 14.1 12.0 - 15.0 g/dL   HCT 57.8 46.9 - 62.9 %   MCV 81.8 80.0 - 100.0 fL   MCH 25.7 (L) 26.0 - 34.0 pg   MCHC 31.4 30.0 - 36.0 g/dL   RDW 52.8 (H) 41.3 - 24.4 %   Platelets 256 150 - 400 K/uL   nRBC 0.0 0.0 - 0.2 %   Neutrophils Relative % 64 %   Neutro Abs 4.2 1.7 - 7.7 K/uL   Lymphocytes Relative 23 %   Lymphs Abs 1.5 0.7 - 4.0 K/uL   Monocytes Relative 11 %   Monocytes Absolute 0.7 0.1 - 1.0 K/uL   Eosinophils Relative 1 %   Eosinophils Absolute 0.1 0.0 - 0.5 K/uL   Basophils Relative 1 %   Basophils Absolute 0.1 0.0 - 0.1 K/uL   Immature Granulocytes 0 %   Abs Immature Granulocytes 0.02 0.00 - 0.07 K/uL    Comment: Performed at [facility], [address], [place], [place] [address]  Magnesium     Status: None   Collection Time: 09/30/22 11:36 AM  Result Value Ref Range   Magnesium 2.3 1.7 - 2.4 mg/dL    Comment: Performed at [facility], [address], [place], [place] [address]  I-Stat beta hCG blood, ED     Status: Abnormal   Collection Time: 09/30/22 12:01 PM  Result Value Ref Range   I-stat hCG, quantitative 5.2 (H) <5 mIU/mL   Comment 3            Comment:  GEST. AGE      CONC.  (mIU/mL)   <=1 WEEK        5 - 50     2 WEEKS       50 - 500     3 WEEKS       100 - 10,000     4 WEEKS     1,000 - 30,000        FEMALE AND NON-PREGNANT FEMALE:     LESS THAN 5 mIU/mL   Urine rapid drug screen (hosp performed)     Status: None   Collection Time: 09/30/22 12:32 PM  Result Value Ref Range   Opiates NONE DETECTED NONE DETECTED   Cocaine NONE DETECTED NONE DETECTED   Benzodiazepines NONE DETECTED NONE DETECTED   Amphetamines NONE DETECTED NONE DETECTED   Tetrahydrocannabinol NONE DETECTED NONE DETECTED   Barbiturates NONE DETECTED NONE DETECTED    Comment: (NOTE) DRUG SCREEN FOR MEDICAL PURPOSES ONLY.  IF CONFIRMATION IS NEEDED FOR ANY PURPOSE, NOTIFY LAB WITHIN 5 DAYS.  LOWEST DETECTABLE LIMITS FOR URINE DRUG SCREEN Drug Class                     Cutoff (ng/mL) Amphetamine and metabolites    1000 Barbiturate and metabolites    200 Benzodiazepine                 200 Opiates and metabolites        300 Cocaine and metabolites        300 THC                            50 Performed at [facility], [address] [place], [place] [address]   Pregnancy, urine     Status: None   Collection Time: 09/30/22 12:32 PM  Result Value Ref Range   Preg Test, Ur NEGATIVE NEGATIVE    Comment:        THE SENSITIVITY OF THIS METHODOLOGY IS >20 mIU/mL. Performed at [facility], [address] [place], [place] [address]     Blood Alcohol  level:  Lab Results  Component Value Date   ETH <10 09/30/2022   ETH <10 04/17/2021    Physical Findings:  CIWA:    COWS:      Psychiatric Specialty Exam:  Presentation  General Appearance:  Casual; Other (comment) (Atypical interpersonal style)  Eye Contact: Other (comment) (Variable to fair)  Speech: Clear and Coherent  Speech Volume: Normal  Handedness: Right   Mood and Affect  Mood: Anxious  Affect: Full Range   Thought Process  Thought Processes: Other (comment) (Mostly linear and logical, intermittently circumstantial, frequently vague or uncertain)  Descriptions of Associations:Loose  Orientation:Full (Time, Place and Person)  Thought Content:Logical; Other (comment) (Goal directed, no over delusional themes)  History of Schizophrenia/Schizoaffective disorder:No  Duration of Psychotic Symptoms:Less than six months  Hallucinations:Hallucinations: None (Denies)  Ideas of Reference:None  Suicidal Thoughts:Suicidal Thoughts: No  Homicidal Thoughts:Homicidal Thoughts: No   Sensorium  Memory: Immediate Poor; Recent Poor; Remote Fair  Judgment: Impaired  Insight: Lacking   Executive Functions  Concentration: Poor  Attention Span: Poor  Recall: Poor  Fund of Knowledge: Poor  Language: Fair   Psychomotor Activity  Psychomotor Activity: Psychomotor Activity: Normal   Assets  Assets: Communication Skills; Social Lawyer; Financial Resources/Insurance; Housing; Physical Health; Resilience   Sleep  Sleep: Sleep: Fair  Physical Exam: Physical Exam ROS Blood pressure 125/71, pulse 80, temperature (!) 97.5 F (36.4 C), temperature source Oral, resp. rate 16, height 5\' 4"  (1.626 m), weight 82 kg, SpO2 100 %. Body mass index is 31.03 kg/m.   Medical Decision Making:  Diagnostically, patient at this time presents with symptomology that is most indicative of an unspecified psychotic presentation.   Today upon reevaluation, the patient is meaningfully better in the aforementioned areas (I.e., no expression of delusional themes, no endorsements of auditory visual hallucinations and/or appearing to be responding to internal stimuli, orientation is intact), however, at this time, discussed with the patient that it is the psychiatric team's recommendation to hold off on clearance at this time, and will reevaluate in the morning; given the patient's abrupt change in mental status today, lack of insight into events that have transpired, inability to reach family for safety planning x2 and collateral that she lives with, and recent disorganization in the patient's presentation reported by staff as early as 10/01/22.  Will continue to recommend inpatient psychiatric hospitalization at this time, but will continue to work closely with family and the patient.  #Unspecified psychosis not due to a substance or known physiological condition (HCC) -Continue current medication regimen  -TTS to seek placement inpatient   Lenox Ponds, NP 10/02/2022, 10:52 AM

## 2022-10-02 NOTE — Progress Notes (Signed)
LCSW Progress Note  [id number]   Cindy Hoffman  10/02/2022  2:05 AM    Inpatient Behavioral Health Placement  Pt meets inpatient criteria per Alona Bene, PMHNP. There are no available beds within [facility] system per [facility] Fransico Michael, RN. Referral was sent to the following facilities;    Destination  Service Provider Address Phone Fax  [facility]  [address], [place] [address] [phone]  [facility] [address]  [address] [place] [address] [phone]  [facility]  [address] [place], [place] [address] [phone]  [facility] [address] [place]  [address], [place] [address] [phone]  [facility]  [address] [place] [address] [phone]  [facility]  [address], [place] [address] [phone]  [facility]  [address] [place] [address] [phone]  [facility]  [address] [place], [place] [address] [phone]  [facility]  [address] [place] [address] [phone]  [facility]  [address] [place] [address] [phone]  [facility]  [address], [place] [address] [phone]  [facility]  [address] [place] [address] [phone]  [facility]  [address], [place] [address] [phone]  [facility]  [address], [place] [address] [phone]  [facility]  [address] [place] [address] [phone]  [facility]  [address],  [place] [address] [phone]  [facility]  [address], [place] [address] [phone]  [facility]  [address] [place] [address] [phone]  [facility]  [address] [place] [address] [phone]  [facility]  [address] [place] [address] [phone]  [facility]  [address] [place] [address] [phone]  [facility]  [address] [place] [address] [phone]  [facility]  [address] [place], [place] [address] [phone]  [facility]  [address] [place] [address] [phone]  [facility]  [address] [place] [address] [phone]  [facility]  [address] [place] [place] [address] [phone]  [facility]  [address], [place] [address] [phone]  [facility]  [address] [place], [place] [address] [phone]  [facility]  [address] [place] [address] [phone]  [facility]  [address] [place] [address] [phone]  [facility]  [address] [place] [address] [phone]  [facility]  [address], [place] [address] [phone]  [facility]  [address] [place] [address] [phone]   Situation ongoing,  CSW will follow up.    Maryjean Ka, MSW, LCSWA 10/02/2022 2:05 AM

## 2022-10-02 NOTE — ED Notes (Signed)
Patient complaining of nausea and headache.  Reports pain has increased to a 6/10

## 2022-10-03 ENCOUNTER — Emergency Department (HOSPITAL_COMMUNITY): Payer: Medicare HMO

## 2022-10-03 DIAGNOSIS — F22 Delusional disorders: Secondary | ICD-10-CM | POA: Diagnosis not present

## 2022-10-03 MED ORDER — GADOBUTROL 1 MMOL/ML IV SOLN
8.0000 mL | Freq: Once | INTRAVENOUS | Status: AC | PRN
  Administered 2022-10-03: 8 mL via INTRAVENOUS

## 2022-10-03 NOTE — Discharge Summary (Cosign Needed Addendum)
[facility] Psych ED Discharge  10/03/2022 10:46 AM Cindy Hoffman  MRN:  [id number]  Principal Problem: Unspecified psychosis not due to a substance or known physiological condition [facility]) Discharge Diagnoses: Principal Problem:   Unspecified psychosis not due to a substance or known physiological condition (HCC) Active Problems:   Hallucinations   Altered mental status  Clinical Impression:  Final diagnoses:  Psychosis, unspecified psychosis type (HCC)  Delusions (HCC)   Subjective: Cindy Hoffman is a 47 year old Caucasian female with a past psychiatric history of alcohol abuse/intoxication, altered mental status, hallucinations, anxiety, and unspecified psychosis, with multiple medical comorbidities, who presented this encounter for psychiatric complaint of auditory visual hallucinations.  Patient was brought in by EMS.   Patient seen today for face-to-face reevaluation at [facility] emergency department.  Patient during reassessment endorses that she is doing better today overall, has no mental health complaints primarily, but does endorse that her "MS if flairing up" and she would like to speak to the ED doctor about this.  Patient orientation is intact, she is attentive during engagement, and she is linear and logical in her thought process.  Patient endorses no suicidal or homicidal ideations, denies visual hallucinations, and endorses chronic auditory hallucinations of voices talking are still evident, but are not distressing and/or command in nature.  Patient endorses that she is ready for discharge and she is focused on this, feels that the hospital is not producing any additional benefit at this time, feels safe to return home to Father Sam whom she lives with.  Patient reports that she slept fair over the night and is eating and drinking fine, no concerns with self-care reported by staff and/or the patient.  Collateral from Father (Sam)   Collateral obtained again today from Father Sam,  whom the patient lives with.  Father reports that he has no safety concerns for the patient returning home, reports that he can pick her up upon discharge.  Father is in agreement with patient to follow-up with outpatient therapy, medication management, and PHP at [facility] crisis center.  Education given to father about plan of care that was discussed with patient.   ED Assessment Time Calculation: Start Time: 1000 Stop Time: 1030 Total Time in Minutes (Assessment Completion): 30   Past Psychiatric History: See notes  Past Medical History:  Past Medical History:  Diagnosis Date   Anxiety    Arthritis    knees   Asthma    Autoimmune encephalitis    Chronic pain syndrome    Complication of anesthesia    "STATES WAKES UP AND CANT BREATHE AND NEEDS BREATHING TREATMENT  IN PACU"   Constipation due to opioid therapy    Dysrhythmia    palpitations- "from thyroid"   Fibromyalgia    Fracture of right foot    Gastric ulcer 2018   GERD (gastroesophageal reflux disease)    History of kidney stones    "inbedded"   Migraine    Mirgraine-   Multiple sclerosis (HCC)    Myofacial muscle pain    Obesity    Occipital neuralgia 2016   OSA (obstructive sleep apnea)    Has CPAP   Sleep apnea     NO CPAP--could not tolerates mask   Thyroid disease    Viral respiratory infection 06/26/2013   ? influenza    Past Surgical History:  Procedure Laterality Date   COLONOSCOPY      x2   ENDOMETRIAL ABLATION     ENDOMETRIAL ABLATION  07/17/2017  GALLBLADDER SURGERY  06/2016   GASTRIC ROUX-EN-Y  05/2016   HIP ARTHROSCOPY Right 05/10/2019   Procedure: Right hip arthroscopy with labrum repair and  femoroplasty;  Surgeon: Yolonda Kida, MD;  Location: [facility] OR;  Service: Orthopedics;  Laterality: Right;  2.5 hrs   NASAL SEPTUM SURGERY     THYROID LOBECTOMY Right 07/19/2013   Procedure: THYROID LOBECTOMY;  Surgeon: Shelly Rubenstein, MD;  Location: [facility] ORS;  Service: General;  Laterality:  Right;   TUBAL LIGATION     WISDOM TOOTH EXTRACTION     Family History:  Family History  Problem Relation Age of Onset   Hypertension Mother    Hypertension Father    Prostate cancer Father    Diabetes Sister    Cancer Paternal Grandmother        breast   Social History:  Social History   Substance and Sexual Activity  Alcohol Use Yes   Alcohol/week: 2.0 standard drinks of alcohol   Types: 2 Glasses of wine per week     Social History   Substance and Sexual Activity  Drug Use No    Social History   Socioeconomic History   Marital status: Divorced    Spouse name: Not on file   Number of children: 1   Years of education: GED   Highest education level: Not on file  Occupational History   Occupation: unemployed  Tobacco Use   Smoking status: Every Day    Packs/day: 0.50    Years: 0.00    Additional pack years: 0.00    Total pack years: 0.00    Types: Cigarettes    Last attempt to quit: 11/2015    Years since quitting: 6.9   Smokeless tobacco: Never   Tobacco comments:    1-2 cigs per day  Vaping Use   Vaping Use: Never used  Substance and Sexual Activity   Alcohol use: Yes    Alcohol/week: 2.0 standard drinks of alcohol    Types: 2 Glasses of wine per week   Drug use: No   Sexual activity: Not on file  Other Topics Concern   Not on file  Social History Narrative   Lives with father   Caffeine use: rare   Right handed   Social Determinants of Health   Financial Resource Strain: Not on file  Food Insecurity: No Food Insecurity (07/18/2022)   Hunger Vital Sign    Worried About Running Out of Food in the Last Year: Never true    Ran Out of Food in the Last Year: Never true  Transportation Needs: No Transportation Needs (07/18/2022)   PRAPARE - Administrator, Civil Service (Medical): No    Lack of Transportation (Non-Medical): No  Physical Activity: Not on file  Stress: Not on file  Social Connections: Not on file    Tobacco Cessation:   Prescription not provided because: Declined.  Current Medications: Current Facility-Administered Medications  Medication Dose Route Frequency Provider Last Rate Last Admin   albuterol (VENTOLIN HFA) 108 (90 Base) MCG/ACT inhaler 2 puff  2 puff Inhalation Q4H PRN Terald Sleeper, MD   2 puff at 10/03/22 0728   amitriptyline (ELAVIL) tablet 100 mg  100 mg Oral QHS Charlynne Pander, MD   100 mg at 10/02/22 2120   baclofen (LIORESAL) tablet 10 mg  10 mg Oral TID PRN Charlynne Pander, MD   10 mg at 10/03/22 0926   escitalopram (LEXAPRO) tablet 10 mg  10 mg  Oral Daily Motley-Mangrum, Jadeka A, PMHNP   10 mg at 10/03/22 1610   hydrOXYzine (ATARAX) tablet 25 mg  25 mg Oral TID PRN Motley-Mangrum, Jadeka A, PMHNP       LORazepam (ATIVAN) tablet 1 mg  1 mg Oral PRN Motley-Mangrum, Jadeka A, PMHNP       And   ziprasidone (GEODON) injection 20 mg  20 mg Intramuscular PRN Motley-Mangrum, Jadeka A, PMHNP       naloxegol oxalate (MOVANTIK) tablet 25 mg  25 mg Oral QHS Charlynne Pander, MD   25 mg at 10/02/22 2119   OLANZapine zydis (ZYPREXA) disintegrating tablet 5 mg  5 mg Oral QHS Motley-Mangrum, Jadeka A, PMHNP   5 mg at 10/02/22 2120   ondansetron (ZOFRAN-ODT) disintegrating tablet 4 mg  4 mg Oral Q8H PRN Zadie Rhine, MD   4 mg at 10/02/22 1913   oxyCODONE (Oxy IR/ROXICODONE) immediate release tablet 10 mg  10 mg Oral QID PRN Charlynne Pander, MD   10 mg at 10/03/22 0737   topiramate (TOPAMAX) tablet 100 mg  100 mg Oral QHS Charlynne Pander, MD   100 mg at 10/02/22 2120   Current Outpatient Medications  Medication Sig Dispense Refill   albuterol (PROVENTIL HFA;VENTOLIN HFA) 108 (90 BASE) MCG/ACT inhaler Inhale 2 puffs into the lungs every 6 (six) hours as needed for wheezing or shortness of breath.      albuterol (PROVENTIL) (2.5 MG/3ML) 0.083% nebulizer solution Take 2.5 mg by nebulization every 6 (six) hours as needed for wheezing or shortness of breath.     amitriptyline (ELAVIL) 25  MG tablet TAKE 4 TABLETS (100 MG TOTAL) BY MOUTH AT BEDTIME. MUST BE SEEN FOR FURTHER REFILLS. (Patient taking differently: Take 100 mg by mouth at bedtime.) 120 tablet 0   cyanocobalamin (VITAMIN B12) 1000 MCG/ML injection Inject 1 mL (1,000 mcg total) into the muscle every 28 (twenty-eight) days. 1 mL 2   ferrous sulfate 325 (65 FE) MG tablet Take 1 tablet (325 mg total) by mouth 2 (two) times daily with a meal. 60 tablet 1   montelukast (SINGULAIR) 10 MG tablet Take 10 mg by mouth at bedtime.     MOVANTIK 25 MG TABS tablet Take 25 mg by mouth at bedtime.     ondansetron (ZOFRAN ODT) 4 MG disintegrating tablet Take 1 tablet (4 mg total) by mouth every 8 (eight) hours as needed for nausea or vomiting. 20 tablet 0   Oxycodone HCl 10 MG TABS Take 10 mg by mouth 4 (four) times daily as needed (for pain).     pantoprazole (PROTONIX) 40 MG tablet Take 40 mg by mouth daily.     tiZANidine (ZANAFLEX) 4 MG tablet TAKE 1 OR 2 TABLETS BY MOUTH EVERY 8 HOURS AS NEEDED FOR MUSCLE SPASM (NEED OFFICE VISIT) (Patient taking differently: Take 4-8 mg by mouth every 8 (eight) hours as needed for muscle spasms.) 360 tablet 1   topiramate (TOPAMAX) 100 MG tablet Take 1 tablet (100 mg total) by mouth at bedtime. 90 tablet 4   XTAMPZA ER 13.5 MG C12A Take 13.5 mg by mouth every 12 (twelve) hours.     PTA Medications: (Not in a hospital admission)   [place] Scale:  Flowsheet Row ED from 09/30/2022 in [facility] Emergency Department at [facility] ED to Hosp-Admission (Discharged) from 07/17/2022 in [facility] 80M KIDNEY UNIT ED from 07/16/2022 in [facility]  C-SSRS RISK CATEGORY No Risk No Risk No Risk  Psychiatric Specialty Exam: Presentation  General Appearance:  Appropriate for Environment  Eye Contact: Fair  Speech: Clear and Coherent  Speech Volume: Normal  Handedness: Right   Mood and Affect  Mood: Anxious  Affect: Full  Range   Thought Process  Thought Processes: Linear; Other (comment) (Goal directed)  Descriptions of Associations:Intact  Orientation:Full (Time, Place and Person)  Thought Content:Logical  History of Schizophrenia/Schizoaffective disorder:No  Duration of Psychotic Symptoms:Less than six months  Hallucinations:Hallucinations: Auditory Description of Auditory Hallucinations: Reports she always hears voices that talk and say random things, states, "they just say things" Description of Visual Hallucinations: Denies  Ideas of Reference:None  Suicidal Thoughts:Suicidal Thoughts: No  Homicidal Thoughts:Homicidal Thoughts: No   Sensorium  Memory: Immediate Fair; Recent Fair; Remote Fair  Judgment: Fair  Insight: Fair   Art therapist  Concentration: Fair  Attention Span: Fair  Recall: [employer] of Knowledge: Fair  Language: Fair   Psychomotor Activity  Psychomotor Activity: Psychomotor Activity: Normal   Assets  Assets: Social Support; Manufacturing systems engineer; Desire for Improvement; Transportation; Financial Resources/Insurance; Housing; Intimacy; Physical Health; Resilience   Sleep  Sleep: Sleep: Fair    Physical Exam: Physical Exam Vitals and nursing note reviewed.  Pulmonary:     Effort: Pulmonary effort is normal.  Neurological:     Mental Status: She is alert.  Psychiatric:        Attention and Perception: Attention normal. She perceives auditory hallucinations. She does not perceive visual hallucinations.        Mood and Affect: Mood is anxious.        Speech: Speech normal.        Behavior: Behavior normal. Behavior is cooperative.        Thought Content: Thought content normal. Thought content is not paranoid or delusional. Thought content does not include homicidal or suicidal ideation.        Cognition and Memory: Cognition and memory normal.    Review of Systems  Constitutional: Negative.   Neurological:  Negative for  dizziness, tingling, tremors, speech change, focal weakness, seizures, loss of consciousness, weakness and headaches.       "I feel my MS is fairing up"    Blood pressure 134/75, pulse 78, temperature (!) 97.5 F (36.4 C), temperature source Oral, resp. rate 18, height 5\' 4"  (1.626 m), weight 82 kg, SpO2 99 %. Body mass index is 31.03 kg/m.   Demographic Factors:  Caucasian, Low socioeconomic status, and Unemployed  Loss Factors: Decline in physical health  Historical Factors: Family history of mental illness or substance abuse  Risk Reduction Factors:   Responsible for children under 39 years of age, Sense of responsibility to family, Religious beliefs about death, Living with another person, especially a relative, Positive social support, Positive therapeutic relationship, and Positive coping skills or problem solving skills  Continued Clinical Symptoms:  Chronic Pain Previous Psychiatric Diagnoses and Treatments Medical Diagnoses and Treatments/Surgeries  Cognitive Features That Contribute To Risk:  None    Suicide Risk:  Minimal: No identifiable suicidal ideation.  Patients presenting with no risk factors but with morbid ruminations; may be classified as minimal risk based on the severity of the depressive symptoms   Follow-up Information     [facility]. Call.   Specialty: Behavioral Health Why: Call [phone] for appointments. PHP is Monday through Friday from 9am-1pm, lasts for 2-3 weeks, and is virtual. Also be on the lookout for email. Contact information: [address] [place] [address]  [phone]        [facility] Follow up.   Specialty: Urgent Care Why: As needed for medication management. Contact information: [address] [place] [address] [phone]                Plan Of Care/Follow-up recommendations:   -Recommend follow-up with [facility] for PHP and  medication management -Recommend follow-up with Neurology outpatient  Safety Plan COLETTE JOENS will reach out to her father, call 911 or call mobile crisis, or go to nearest emergency room if condition worsens or if suicidal thoughts become active Patients' will follow up with [facility] PHP for outpatient psychiatric services (therapy/medication management).  The suicide prevention education provided includes the following: Suicide risk factors Suicide prevention and interventions National Suicide Hotline telephone number [facility] assessment telephone number [facility] Emergency Assistance 911 [facility] and/or Residential Mobile Crisis Unit telephone number Request made of family/significant other to:  Father (whom patient lives with) Remove weapons (e.g., guns, rifles, knives), all items previously/currently identified as safety concern.   Remove drugs/medications (over the counter, prescriptions, illicit drugs), all items previously/currently identified as a safety concern.   Medical Decision Making:  Patient today during reevaluation presents oriented, linear and logical in her thought process, and showing continued improvements overall, as well as does not present with any suicidal or homicidal ideations, and/or concerns for safety.  Obtained collateral from father whom the patient lives with, family is in agreement to plan of care and recommendations, as well as the patient returning home today upon psychiatric clearance.    Patient at this time is psychiatrically cleared.   Disposition: Data Unavailable  Lenox Ponds, NP 10/03/2022, 10:46 AM

## 2022-10-03 NOTE — ED Notes (Signed)
Patient discharged off unit to home per provider. Patient alert, calm, cooperative, no s/s of distress. Patient discharge information given to patient, patient acknowledged understanding. Belongings given to patient. Patient ambulatory off unit, escorted by RN. Patient transported by family member.

## 2022-10-03 NOTE — ED Provider Notes (Addendum)
Emergency Medicine Observation Re-evaluation Note  Cindy Hoffman is a 47 y.o. female, seen on rounds today.  Pt initially presented to the ED for complaints of Psychiatric Evaluation Currently, the patient is resting. She was psych cleared this AM. She is concerned about an MS flare. Feels like she's "dreaming". Also feels diffusely weak, legs worse than arms. Has a right frontal headache. Thinks she needs steroids.  Physical Exam  BP 134/75 (BP Location: Right Arm)   Pulse 78   Temp (!) 97.5 F (36.4 C) (Oral)   Resp 18   Ht 5\' 4"  (1.626 m)   Wt 82 kg   SpO2 99%   BMI 31.03 kg/m  Physical Exam General: no acute distress Neuro: normal EOM. No facial droop/slurred speech. Poor effort in strength testing, but has symmetric weakness/poor effort in all 4 extremities (leg more than arms, but no unilateral weakness Lungs: normal effort Psych: no pyschosis  ED Course / MDM  EKG:EKG Interpretation  Date/Time:  Thursday Sep 30 2022 10:46:13 EDT Ventricular Rate:  97 PR Interval:  151 QRS Duration: 101 QT Interval:  377 QTC Calculation: 479 R Axis:   75 Text Interpretation: Sinus rhythm Abnormal inferior Q waves No significant change since last tracing Confirmed by Richardean Canal [phone]) on 09/30/2022 5:33:09 PM  I have reviewed the labs performed to date as well as medications administered while in observation.  Recent changes in the last 24 hours include phenergan.  Plan  Current plan is patient is psych cleared.  Patient is complaining that she feels like she has a MS flare.  Unclear if she has true generalized weakness versus poor effort.  Consult neuro and get MRIs.  12:39 PM Discussed with Dr. Otelia Limes.  This could be MS, may be a mild case based on his review of the old images.  Given her symptoms including her legs he would recommend MRI and her entire spine along with her brain.  This will be with and without contrast.  3:28 PM MRI brain and c-spine without new lesions. Patient  is feeling well. On re-exam, she is walking when told her initial scans are ok. If T/L spine don't show active MS, can d/c. Psych cleared. Care to Dr. Renaye Rakers.   Pricilla Loveless, MD 10/03/22 1528  3:31 PM MRI T/L spine with different disc issues, but no demyelinating disease.  There is 1 area where it is even touching the cord but she does not have any spinal cord emergency symptoms.  Will discharge.    Pricilla Loveless, MD 10/03/22 [phone]

## 2022-10-03 NOTE — Discharge Instructions (Addendum)
  Discharge recommendations:  Patient is to take medications as prescribed. Please see information for follow-up appointment with psychiatry and therapy. Please follow up with your primary care provider for all medical related needs.   Therapy: We recommend that patient participate in individual therapy to address mental health concerns.  Medications: The patient or guardian is to contact a medical professional and/or outpatient provider to address any new side effects that develop. The patient or guardian should update outpatient providers of any new medications and/or medication changes.   Atypical antipsychotics: If you are prescribed an atypical antipsychotic, it is recommended that your height, weight, BMI, blood pressure, fasting lipid panel, and fasting blood sugar be monitored by your outpatient providers.  Safety:  The patient should abstain from use of illicit substances/drugs and abuse of any medications. If symptoms worsen or do not continue to improve or if the patient becomes actively suicidal or homicidal then it is recommended that the patient return to the closest hospital emergency department, the [facility], or call 911 for further evaluation and treatment. National Suicide Prevention Lifeline 1-800-SUICIDE or 951-146-1082.  About 988 988 offers 24/7 access to trained crisis counselors who can help people experiencing mental health-related distress. People can call or text 988 or chat 988lifeline.org for themselves or if they are worried about a loved one who may need crisis support.  Crisis Mobile: [facility]:                     574-767-2992 (for crisis response 24 hours a day) [facility] Hotline:                                            (940) 351-5751   Safety Plan FRANCISCA HIERRO will reach out to her father, call 911 or call mobile crisis, or go to nearest emergency room if condition worsens or if suicidal  thoughts become active Patients' will follow up with [facility] PHP for outpatient psychiatric services (therapy/medication management).  The suicide prevention education provided includes the following: Suicide risk factors Suicide prevention and interventions National Suicide Hotline telephone number [facility] assessment telephone number [facility] Emergency Assistance 911 [facility] and/or Residential Mobile Crisis Unit telephone number Request made of family/significant other to:  Father (whom patient lives with) Remove weapons (e.g., guns, rifles, knives), all items previously/currently identified as safety concern.   Remove drugs/medications (over the counter, prescriptions, illicit drugs), all items previously/currently identified as a safety concern.

## 2022-10-03 NOTE — ED Notes (Signed)
Patient getting MRI

## 2022-10-03 NOTE — Progress Notes (Signed)
Per Psych provider Arsenio Loader, NP pt is pysch cleared. This CSW will now remove from the [facility] shift report. TOC to assist with discharge if need is identified.    Maryjean Ka, MSW, [facility] 10/03/2022 4:04 PM

## 2022-10-05 ENCOUNTER — Telehealth (HOSPITAL_COMMUNITY): Payer: Self-pay | Admitting: Professional

## 2022-10-06 ENCOUNTER — Other Ambulatory Visit (HOSPITAL_COMMUNITY): Payer: Self-pay

## 2022-10-06 MED ORDER — OXYCODONE HCL 10 MG PO TABS
10.0000 mg | ORAL_TABLET | Freq: Every day | ORAL | 0 refills | Status: DC | PRN
  Filled 2022-10-06: qty 150, 30d supply, fill #0

## 2022-10-06 MED ORDER — MOVANTIK 25 MG PO TABS
25.0000 mg | ORAL_TABLET | Freq: Every day | ORAL | 0 refills | Status: DC | PRN
  Filled 2022-10-06: qty 30, 30d supply, fill #0

## 2022-10-06 MED ORDER — PROMETHAZINE HCL 12.5 MG PO TABS
12.5000 mg | ORAL_TABLET | Freq: Three times a day (TID) | ORAL | 0 refills | Status: DC | PRN
  Filled 2022-10-06: qty 15, 5d supply, fill #0

## 2022-10-07 ENCOUNTER — Other Ambulatory Visit (HOSPITAL_COMMUNITY): Payer: Self-pay

## 2022-10-07 MED ORDER — EMGALITY 120 MG/ML ~~LOC~~ SOAJ: 120.0000 mg | SUBCUTANEOUS | 0 refills | Status: DC

## 2022-10-07 MED ORDER — EMGALITY 120 MG/ML ~~LOC~~ SOAJ
120.0000 mg | SUBCUTANEOUS | 11 refills | Status: DC
  Filled 2022-10-13: qty 1, 30d supply, fill #0
  Filled 2022-11-30: qty 1, 30d supply, fill #1

## 2022-10-07 MED ORDER — AMOXICILLIN 500 MG PO CAPS: 500.0000 mg | ORAL_CAPSULE | Freq: Three times a day (TID) | ORAL | 1 refills | Status: DC

## 2022-10-07 MED ORDER — NALOXEGOL OXALATE 25 MG PO TABS: 25.0000 mg | ORAL_TABLET | Freq: Every day | ORAL | 0 refills | Status: DC | PRN

## 2022-10-07 MED ORDER — NALOXEGOL OXALATE 25 MG PO TABS: 25.0000 mg | ORAL_TABLET | Freq: Every day | ORAL | 0 refills | Status: DC

## 2022-10-08 ENCOUNTER — Other Ambulatory Visit (HOSPITAL_COMMUNITY): Payer: Self-pay

## 2022-10-08 ENCOUNTER — Telehealth (HOSPITAL_COMMUNITY): Payer: Self-pay | Admitting: Professional

## 2022-10-12 ENCOUNTER — Telehealth (HOSPITAL_COMMUNITY): Payer: Self-pay | Admitting: Licensed Clinical Social Worker

## 2022-10-13 ENCOUNTER — Other Ambulatory Visit (HOSPITAL_COMMUNITY): Payer: Self-pay

## 2022-10-13 MED ORDER — ONDANSETRON 8 MG PO TBDP
8.0000 mg | ORAL_TABLET | Freq: Every day | ORAL | 0 refills | Status: DC | PRN
  Filled 2022-10-13: qty 30, 30d supply, fill #0

## 2022-10-13 MED ORDER — MONTELUKAST SODIUM 10 MG PO TABS
ORAL_TABLET | ORAL | 0 refills | Status: DC
  Filled 2022-10-13: qty 90, 90d supply, fill #0

## 2022-10-13 MED ORDER — FUROSEMIDE 20 MG PO TABS
20.0000 mg | ORAL_TABLET | Freq: Every day | ORAL | 1 refills | Status: DC
  Filled 2022-10-13: qty 90, 90d supply, fill #0
  Filled 2023-01-29: qty 90, 90d supply, fill #1

## 2022-10-13 MED ORDER — AMITRIPTYLINE HCL 100 MG PO TABS
100.0000 mg | ORAL_TABLET | Freq: Every evening | ORAL | 1 refills | Status: DC | PRN
  Filled 2022-10-13: qty 90, 90d supply, fill #0
  Filled 2022-12-06 – 2022-12-29 (×2): qty 90, 90d supply, fill #1

## 2022-10-13 MED ORDER — IRON 325 (65 FE) MG PO TABS
1.0000 | ORAL_TABLET | Freq: Every day | ORAL | 1 refills | Status: DC
  Filled 2022-10-13: qty 90, 90d supply, fill #0
  Filled 2023-01-29: qty 90, 90d supply, fill #1

## 2022-10-13 MED ORDER — PROAIR RESPICLICK 108 (90 BASE) MCG/ACT IN AEPB
2.0000 | INHALATION_SPRAY | RESPIRATORY_TRACT | 0 refills | Status: DC | PRN
  Filled 2022-10-13: qty 1, 25d supply, fill #0

## 2022-10-13 MED ORDER — PAROXETINE HCL 10 MG PO TABS
10.0000 mg | ORAL_TABLET | Freq: Every day | ORAL | 0 refills | Status: DC
  Filled 2022-10-13: qty 30, 30d supply, fill #0

## 2022-10-13 MED ORDER — GABAPENTIN 300 MG/6ML PO SOLN
900.0000 mg | Freq: Three times a day (TID) | ORAL | 0 refills | Status: DC
  Filled 2022-10-13: qty 1620, 30d supply, fill #0

## 2022-10-13 MED ORDER — BACLOFEN 10 MG PO TABS
10.0000 mg | ORAL_TABLET | Freq: Every day | ORAL | 0 refills | Status: DC | PRN
  Filled 2022-10-13: qty 30, 30d supply, fill #0

## 2022-10-13 MED ORDER — PANTOPRAZOLE SODIUM 40 MG PO TBEC
40.0000 mg | DELAYED_RELEASE_TABLET | Freq: Every day | ORAL | 1 refills | Status: DC
  Filled 2022-10-13: qty 90, 90d supply, fill #0
  Filled 2023-01-23: qty 90, 90d supply, fill #1

## 2022-10-14 ENCOUNTER — Other Ambulatory Visit (HOSPITAL_COMMUNITY): Payer: Self-pay

## 2022-10-15 ENCOUNTER — Other Ambulatory Visit (HOSPITAL_COMMUNITY): Payer: Self-pay

## 2022-10-16 ENCOUNTER — Other Ambulatory Visit (HOSPITAL_COMMUNITY): Payer: Self-pay

## 2022-10-16 MED ORDER — MOVANTIK 25 MG PO TABS
25.0000 mg | ORAL_TABLET | Freq: Every day | ORAL | 0 refills | Status: DC | PRN
  Filled 2022-10-16: qty 30, 30d supply, fill #0

## 2022-10-19 ENCOUNTER — Other Ambulatory Visit (HOSPITAL_COMMUNITY): Payer: Self-pay

## 2022-10-26 ENCOUNTER — Other Ambulatory Visit (HOSPITAL_COMMUNITY): Payer: Self-pay

## 2022-10-26 MED ORDER — PAROXETINE HCL 10 MG PO TABS
10.0000 mg | ORAL_TABLET | Freq: Every day | ORAL | 3 refills | Status: DC
  Filled 2022-10-26 – 2022-11-30 (×2): qty 30, 30d supply, fill #0
  Filled 2023-03-17: qty 30, 30d supply, fill #1

## 2022-11-01 ENCOUNTER — Other Ambulatory Visit (HOSPITAL_COMMUNITY): Payer: Self-pay

## 2022-11-01 MED ORDER — MOVANTIK 25 MG PO TABS
25.0000 mg | ORAL_TABLET | Freq: Every day | ORAL | 0 refills | Status: DC | PRN
  Filled 2022-11-01: qty 30, 30d supply, fill #0

## 2022-11-01 MED ORDER — OXYCODONE HCL 10 MG PO TABS
10.0000 mg | ORAL_TABLET | Freq: Every day | ORAL | 0 refills | Status: DC | PRN
  Filled 2022-11-01 – 2022-11-05 (×2): qty 150, 30d supply, fill #0

## 2022-11-05 ENCOUNTER — Other Ambulatory Visit (HOSPITAL_COMMUNITY): Payer: Self-pay

## 2022-11-22 ENCOUNTER — Other Ambulatory Visit (HOSPITAL_COMMUNITY): Payer: Self-pay

## 2022-11-29 ENCOUNTER — Inpatient Hospital Stay: Payer: Medicare HMO

## 2022-11-29 ENCOUNTER — Other Ambulatory Visit: Payer: Self-pay

## 2022-11-29 ENCOUNTER — Inpatient Hospital Stay (HOSPITAL_BASED_OUTPATIENT_CLINIC_OR_DEPARTMENT_OTHER): Payer: Medicare HMO | Admitting: Hematology and Oncology

## 2022-11-29 VITALS — BP 130/94 | HR 91 | Temp 98.1°F | Resp 16 | Wt 189.9 lb

## 2022-11-29 DIAGNOSIS — F17210 Nicotine dependence, cigarettes, uncomplicated: Secondary | ICD-10-CM | POA: Insufficient documentation

## 2022-11-29 DIAGNOSIS — F1090 Alcohol use, unspecified, uncomplicated: Secondary | ICD-10-CM | POA: Insufficient documentation

## 2022-11-29 DIAGNOSIS — D508 Other iron deficiency anemias: Secondary | ICD-10-CM | POA: Diagnosis not present

## 2022-11-29 DIAGNOSIS — D75839 Thrombocytosis, unspecified: Secondary | ICD-10-CM | POA: Diagnosis not present

## 2022-11-29 DIAGNOSIS — Z803 Family history of malignant neoplasm of breast: Secondary | ICD-10-CM | POA: Insufficient documentation

## 2022-11-29 DIAGNOSIS — Z9884 Bariatric surgery status: Secondary | ICD-10-CM | POA: Diagnosis not present

## 2022-11-29 DIAGNOSIS — D500 Iron deficiency anemia secondary to blood loss (chronic): Secondary | ICD-10-CM

## 2022-11-29 LAB — CBC WITH DIFFERENTIAL (CANCER CENTER ONLY)
Abs Immature Granulocytes: 0.02 10*3/uL (ref 0.00–0.07)
Basophils Absolute: 0.1 10*3/uL (ref 0.0–0.1)
Basophils Relative: 1 %
Eosinophils Absolute: 0.1 10*3/uL (ref 0.0–0.5)
Eosinophils Relative: 2 %
HCT: 38.8 % (ref 36.0–46.0)
Hemoglobin: 12.3 g/dL (ref 12.0–15.0)
Immature Granulocytes: 0 %
Lymphocytes Relative: 33 %
Lymphs Abs: 2.1 10*3/uL (ref 0.7–4.0)
MCH: 26.1 pg (ref 26.0–34.0)
MCHC: 31.7 g/dL (ref 30.0–36.0)
MCV: 82.2 fL (ref 80.0–100.0)
Monocytes Absolute: 0.3 10*3/uL (ref 0.1–1.0)
Monocytes Relative: 5 %
Neutro Abs: 3.7 10*3/uL (ref 1.7–7.7)
Neutrophils Relative %: 59 %
Platelet Count: 396 10*3/uL (ref 150–400)
RBC: 4.72 MIL/uL (ref 3.87–5.11)
RDW: 21.4 % — ABNORMAL HIGH (ref 11.5–15.5)
WBC Count: 6.3 10*3/uL (ref 4.0–10.5)
nRBC: 0 % (ref 0.0–0.2)

## 2022-11-29 LAB — CMP (CANCER CENTER ONLY)
ALT: 76 U/L — ABNORMAL HIGH (ref 0–44)
AST: 52 U/L — ABNORMAL HIGH (ref 15–41)
Albumin: 4 g/dL (ref 3.5–5.0)
Alkaline Phosphatase: 116 U/L (ref 38–126)
Anion gap: 10 (ref 5–15)
BUN: 11 mg/dL (ref 6–20)
CO2: 27 mmol/L (ref 22–32)
Calcium: 9 mg/dL (ref 8.9–10.3)
Chloride: 103 mmol/L (ref 98–111)
Creatinine: 0.48 mg/dL (ref 0.44–1.00)
GFR, Estimated: >60 mL/min (ref >60)
Glucose, Bld: 89 mg/dL (ref 70–99)
Potassium: 3.5 mmol/L (ref 3.5–5.1)
Sodium: 140 mmol/L (ref 135–145)
Total Bilirubin: 0.4 mg/dL (ref 0.3–1.2)
Total Protein: 6.9 g/dL (ref 6.5–8.1)

## 2022-11-29 LAB — RETIC PANEL
Immature Retic Fract: 16.3 % — ABNORMAL HIGH (ref 2.3–15.9)
RBC.: 4.68 MIL/uL (ref 3.87–5.11)
Retic Count, Absolute: 22.9 10*3/uL (ref 19.0–186.0)
Retic Ct Pct: 0.5 % (ref 0.4–3.1)
Reticulocyte Hemoglobin: 33.9 pg (ref >27.9)

## 2022-11-29 LAB — IRON AND IRON BINDING CAPACITY (CC-WL,HP ONLY)
Iron: 85 ug/dL (ref 28–170)
Saturation Ratios: 18 % (ref 10.4–31.8)
TIBC: 479 ug/dL — ABNORMAL HIGH (ref 250–450)
UIBC: 394 ug/dL (ref 148–442)

## 2022-11-29 LAB — SEDIMENTATION RATE: Sed Rate: 5 mm/hr (ref 0–22)

## 2022-11-29 LAB — C-REACTIVE PROTEIN: CRP: 0.5 mg/dL (ref <1.0)

## 2022-11-29 NOTE — Progress Notes (Unsigned)
[facility] [phone]   [phone]  INITIAL CONSULT NOTE  Patient Care Team: Iona Hansen, NP as PCP - General (Nurse Practitioner)  Hematological/Oncological History # Thrombocytosis # Iron Deficiency Anemia sp Bariatric Surgery 2018 04/28/2022: WBC 6.6, Hgb 8.4, MCV 68.1, Plt 551 07/17/2022: WBC 5.1, Hgb 6.1, MCV 64.4, Plt 359. Received 1 dose of feraheme 09/30/2022: WBC 6.6, Hgb 14.1, MCV 81.8, Plt 256 11/29/2022: establish care with Dr. Leonides Schanz   CHIEF COMPLAINTS/PURPOSE OF CONSULTATION:  " Thrombocytosis "  HISTORY OF PRESENTING ILLNESS:  Cindy Hoffman 47 y.o. female with medical history significant for GERD, asthma, OSA, obesity, and MS who presents for evaluation of thrombocytosis.  On review of the previous records Cindy Hoffman had labs drawn on 04/28/2022 which showed white blood cell count 6.6, hemoglobin 8.4, MCV 68.1, and platelets of 551.  More recently on 09/30/2022 patient had white blood cell count 6.6, hemoglobin 14.1, MCV 81.8, and platelets of 256.  Due to concern for the patient's anemia and thrombocytosis she was referred to hematology for further evaluation and management.  On exam today Cindy Hoffman had issues with anemia and thrombocytosis for "years".  She reports that she has been as low as a hemoglobin of 6.1 though she has only received 1 blood transfusion.  She notes that she has received IV iron infusions before.  She believes her last 1 was at [facility] in 2018.  She reports that she does have a history of stomach ulcers also followed by gastroenterology at [facility].  She does not have any menstrual bleeding as she underwent an ablation back in 2020.  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  She is not having any overt signs of bleeding, bruising, or dark stools.  She notes that because of her Roux-en-Y she envoy aids greasy foods.  She does enjoy eating red meat like hamburgers and  steak.  On further discussion she reports that her parents both had venous problems.  She has 44 healthy 47 year old girl.  She is a sister with type 2 diabetes.  She smokes cigarettes at approximately 1/2 pack/day but does not drink any alcohol.  She is currently disabled.  She is having some occasional episodes of lightheadedness, dizziness, and shortness of breath.  She reports that she does enjoy chewing ice and does have frequent fatigue.  She thinks some of her symptoms may be related to her MS.  She otherwise denies any fevers, chills, sweats, nausea, vomiting or diarrhea.  A full 10 point ROS is otherwise negative.  MEDICAL HISTORY:  Past Medical History:  Diagnosis Date   Anxiety    Arthritis    knees   Asthma    Autoimmune encephalitis    Chronic pain syndrome    Complication of anesthesia    "STATES WAKES UP AND CANT BREATHE AND NEEDS BREATHING TREATMENT  IN PACU"   Constipation due to opioid therapy    Dysrhythmia    palpitations- "from thyroid"   Fibromyalgia    Fracture of right foot    Gastric ulcer 2018   GERD (gastroesophageal reflux disease)    History of kidney stones    "inbedded"   Migraine    Mirgraine-   Multiple sclerosis (HCC)    Myofacial muscle pain    Obesity    Occipital neuralgia 2016   OSA (obstructive sleep apnea)    Has CPAP   Sleep apnea     NO CPAP--could not  tolerates mask   Thyroid disease    Viral respiratory infection 06/26/2013   ? influenza    SURGICAL HISTORY: Past Surgical History:  Procedure Laterality Date   COLONOSCOPY      x2   ENDOMETRIAL ABLATION     ENDOMETRIAL ABLATION  07/17/2017   GALLBLADDER SURGERY  06/2016   GASTRIC ROUX-EN-Y  05/2016   HIP ARTHROSCOPY Right 05/10/2019   Procedure: Right hip arthroscopy with labrum repair and  femoroplasty;  Surgeon: Yolonda Kida, MD;  Location: [facility] OR;  Service: Orthopedics;  Laterality: Right;  2.5 hrs   NASAL SEPTUM SURGERY     THYROID LOBECTOMY Right 07/19/2013    Procedure: THYROID LOBECTOMY;  Surgeon: Shelly Rubenstein, MD;  Location: [facility] ORS;  Service: General;  Laterality: Right;   TUBAL LIGATION     WISDOM TOOTH EXTRACTION      SOCIAL HISTORY: Social History   Socioeconomic History   Marital status: Divorced    Spouse name: Not on file   Number of children: 1   Years of education: GED   Highest education level: Not on file  Occupational History   Occupation: unemployed  Tobacco Use   Smoking status: Every Day    Current packs/day: 0.00    Types: Cigarettes    Start date: 11/2015    Last attempt to quit: 11/2015    Years since quitting: 7.0   Smokeless tobacco: Never   Tobacco comments:    1-2 cigs per day  Vaping Use   Vaping status: Never Used  Substance and Sexual Activity   Alcohol use: Yes    Alcohol/week: 2.0 standard drinks of alcohol    Types: 2 Glasses of wine per week   Drug use: No   Sexual activity: Not on file  Other Topics Concern   Not on file  Social History Narrative   Lives with father   Caffeine use: rare   Right handed   Social Determinants of Health   Financial Resource Strain: Not on file  Food Insecurity: No Food Insecurity (11/29/2022)   Hunger Vital Sign    Worried About Running Out of Food in the Last Year: Never true    Ran Out of Food in the Last Year: Never true  Transportation Needs: No Transportation Needs (11/29/2022)   PRAPARE - Administrator, Civil Service (Medical): No    Lack of Transportation (Non-Medical): No  Physical Activity: Not on file  Stress: Not on file  Social Connections: Unknown (08/31/2021)   [facility]   Social Network    Social Network: Not on file  Intimate Partner Violence: Not At Risk (11/29/2022)   Humiliation, Afraid, Rape, and Kick questionnaire    Fear of Current or Ex-Partner: No    Emotionally Abused: No    Physically Abused: No    Sexually Abused: No    FAMILY HISTORY: Family History  Problem Relation Age  of Onset   Hypertension Mother    Hypertension Father    Prostate cancer Father    Diabetes Sister    Cancer Paternal Grandmother        breast    ALLERGIES:  is allergic to beta adrenergic blockers, nsaids, and tolmetin.  MEDICATIONS:  Current Outpatient Medications  Medication Sig Dispense Refill   albuterol (PROVENTIL HFA;VENTOLIN HFA) 108 (90 BASE) MCG/ACT inhaler Inhale 2 puffs into the lungs every 6 (six) hours as needed for wheezing or shortness of breath.  albuterol (PROVENTIL) (2.5 MG/3ML) 0.083% nebulizer solution Take 2.5 mg by nebulization every 6 (six) hours as needed for wheezing or shortness of breath.     amitriptyline (ELAVIL) 100 MG tablet Take 1 tablet (100 mg total) by mouth at bedtime as needed. 90 tablet 1   baclofen (LIORESAL) 10 MG tablet Take 1 tablet (10 mg total) by mouth daily as needed. 30 tablet 0   Ferrous Sulfate (IRON) 325 (65 Fe) MG TABS Take 1 tablet (325 mg total) by mouth daily. 90 tablet 1   furosemide (LASIX) 20 MG tablet Take 1 tablet (20 mg total) by mouth daily. 90 tablet 1   gabapentin (NEURONTIN) 300 MG/6ML solution Take 18 mLs (900 mg total) by mouth 3 times daily. 1620 mL 0   Galcanezumab-gnlm (EMGALITY) 120 MG/ML SOAJ Inject 120 mg into the skin every 30 (thirty) days. 1 mL 11   montelukast (SINGULAIR) 10 MG tablet Take 1 tablet by mouth every evening 90 tablet 0   MOVANTIK 25 MG TABS tablet Take 25 mg by mouth at bedtime.     naloxegol oxalate (MOVANTIK) 25 MG TABS tablet Take 1 tablet (25 mg total) by mouth daily as needed. 30 tablet 0   ondansetron (ZOFRAN-ODT) 8 MG disintegrating tablet Dissolve 1 tablet (8 mg total) by mouth daily as needed. 30 tablet 0   Oxycodone HCl 10 MG TABS Take 1 tablet (10 mg total) by mouth 5 (five) times daily as needed (max daily dose 5 tablets) 150 tablet 0   pantoprazole (PROTONIX) 40 MG tablet Take 1 tablet (40 mg total) by mouth daily as directed. 90 tablet 1   PARoxetine (PAXIL) 10 MG tablet Take 1  tablet (10 mg total) by mouth daily. 30 tablet 3   promethazine (PHENERGAN) 12.5 MG tablet Take 1 tablet (12.5 mg) by mouth 3 times daily as needed. 15 tablet 0   tiZANidine (ZANAFLEX) 4 MG tablet Take 1-2 tablets (4-8 mg total) by mouth every 8 (eight) hours as needed FOR MUSCLE SPASM (NEED OFFICE VISIT) 360 tablet 1   No current facility-administered medications for this visit.    REVIEW OF SYSTEMS:   Constitutional: ( - ) fevers, ( - )  chills , ( - ) night sweats Eyes: ( - ) blurriness of vision, ( - ) double vision, ( - ) watery eyes Ears, nose, mouth, throat, and face: ( - ) mucositis, ( - ) sore throat Respiratory: ( - ) cough, ( - ) dyspnea, ( - ) wheezes Cardiovascular: ( - ) palpitation, ( - ) chest discomfort, ( - ) lower extremity swelling Gastrointestinal:  ( - ) nausea, ( - ) heartburn, ( - ) change in bowel habits Skin: ( - ) abnormal skin rashes Lymphatics: ( - ) new lymphadenopathy, ( - ) easy bruising Neurological: ( - ) numbness, ( - ) tingling, ( - ) new weaknesses Behavioral/Psych: ( - ) mood change, ( - ) new changes  All other systems were reviewed with the patient and are negative.  PHYSICAL EXAMINATION:  Vitals:   11/29/22 1349  BP: (!) 130/94  Pulse: 91  Resp: 16  Temp: 98.1 F (36.7 C)  SpO2: 99%   Filed Weights   11/29/22 1349  Weight: 189 lb 14.4 oz (86.1 kg)    GENERAL: well appearing middle-aged Caucasian female in NAD  SKIN: skin color, texture, turgor are normal, no rashes or significant lesions EYES: conjunctiva are pink and non-injected, sclera clear LUNGS: clear to auscultation and percussion with  normal breathing effort HEART: regular rate & rhythm and no murmurs and no lower extremity edema Musculoskeletal: no cyanosis of digits and no clubbing  PSYCH: alert & oriented x 3, fluent speech NEURO: no focal motor/sensory deficits  LABORATORY DATA:  I have reviewed the data as listed    Latest Ref Rng & Units 11/29/2022    2:56 PM  09/30/2022   11:36 AM 07/18/2022   10:19 AM  CBC  WBC 4.0 - 10.5 K/uL 6.3  6.6  7.6   Hemoglobin 12.0 - 15.0 g/dL 11.9  14.7  82.9   Hematocrit 36.0 - 46.0 % 38.8  44.9  37.4   Platelets 150 - 400 K/uL 396  256  418        Latest Ref Rng & Units 11/29/2022    2:56 PM 09/30/2022   11:36 AM 07/18/2022   10:19 AM  CMP  Glucose 70 - 99 mg/dL 89  562  92   BUN 6 - 20 mg/dL 11  7  8    Creatinine 0.44 - 1.00 mg/dL 1.30  8.65  7.84   Sodium 135 - 145 mmol/L 140  135  140   Potassium 3.5 - 5.1 mmol/L 3.5  3.2  3.6   Chloride 98 - 111 mmol/L 103  95  110   CO2 22 - 32 mmol/L 27  26  17    Calcium 8.9 - 10.3 mg/dL 9.0  8.9  8.9   Total Protein 6.5 - 8.1 g/dL 6.9  7.8    Total Bilirubin 0.3 - 1.2 mg/dL 0.4  0.8    Alkaline Phos 38 - 126 U/L 116  147    AST 15 - 41 U/L 52  26    ALT 0 - 44 U/L 76  19       ASSESSMENT & PLAN Cindy Hoffman 47 y.o. female with medical history significant for GERD, asthma, OSA, obesity, and MS who presents for evaluation of thrombocytosis.  After review of the labs, review of the records, and discussion with the patient the patients findings are most consistent with thrombocytosis 2/2 to iron deficiency.   There are two types of thrombocytosis, essential thrombocytosis and secondary thrombocytosis. Essential thrombocytosis is overproduction of platelets due to a driver mutation. The most common mutation is the JAK2 [id number] (50% of cases), but other causes include CALR, MPL, and mutations of unclear significance on NGS. Essential thrombocytosis is a myeloproliferative neoplasm which may require cytoreductive therapy do decrease risk of thrombosis. Secondary thrombocytosis can be caused by low iron levels, increased inflammation,  or splenectomy.  Our workup will focus on determining if there is a secondary cause of this patient's thrombocytosis.    # Thrombocytosis, likely 2/2 to Iron Deficiency   # Iron Deficiency Anemia sp Bariatric Surgery 2018 --workup to  include CBC, CMP, ESR/CRP, and iron panel/ferritin  --patient has no history of splenectomy     --no indication for MPN workup at this time  --If patient were to require iron supplementation will require IV iron due to history of bariatric surgery. --RTC in 3 months or sooner if indicated by the above labs.    Orders Placed This Encounter  Procedures   CBC with Differential (Cancer Center Only)    Standing Status:   Future    Number of Occurrences:   1    Standing Expiration Date:   11/29/2023   CMP (Cancer Center only)    Standing Status:   Future    Number of  Occurrences:   1    Standing Expiration Date:   11/29/2023   Ferritin    Standing Status:   Future    Number of Occurrences:   1    Standing Expiration Date:   11/29/2023   Iron and Iron Binding Capacity ([facility] only)    Standing Status:   Future    Number of Occurrences:   1    Standing Expiration Date:   11/29/2023   Retic Panel    Standing Status:   Future    Number of Occurrences:   1    Standing Expiration Date:   11/29/2023   Sedimentation rate    Standing Status:   Future    Number of Occurrences:   1    Standing Expiration Date:   11/29/2023   C-reactive protein    Standing Status:   Future    Number of Occurrences:   1    Standing Expiration Date:   11/29/2023    All questions were answered. The patient knows to call the clinic with any problems, questions or concerns.  A total of more than 60 minutes were spent on this encounter with face-to-face time and non-face-to-face time, including preparing to see the patient, ordering tests and/or medications, counseling the patient and coordination of care as outlined above.   Ulysees Barns, MD Department of Hematology/Oncology [facility] Phone: [phone] Pager: [phone] Email: [email or web address]  11/30/2022 4:49 PM

## 2022-11-30 ENCOUNTER — Other Ambulatory Visit (HOSPITAL_COMMUNITY): Payer: Self-pay

## 2022-12-01 ENCOUNTER — Telehealth: Payer: Self-pay | Admitting: Hematology and Oncology

## 2022-12-01 ENCOUNTER — Other Ambulatory Visit: Payer: Self-pay

## 2022-12-01 ENCOUNTER — Telehealth: Payer: Self-pay | Admitting: *Deleted

## 2022-12-01 ENCOUNTER — Encounter (HOSPITAL_COMMUNITY): Payer: Self-pay

## 2022-12-01 ENCOUNTER — Other Ambulatory Visit (HOSPITAL_COMMUNITY): Payer: Self-pay

## 2022-12-01 MED ORDER — OXYCODONE HCL 10 MG PO TABS
10.0000 mg | ORAL_TABLET | Freq: Every day | ORAL | 0 refills | Status: DC | PRN
  Filled 2022-12-01: qty 150, 32d supply, fill #0
  Filled 2022-12-06: qty 150, 30d supply, fill #0

## 2022-12-01 NOTE — Telephone Encounter (Signed)
-----   Message from Ulysees Barns IV sent at 11/30/2022  4:50 PM EDT ----- Please let Mrs. Haider know that her hemoglobin and platelet levels are back to normal.  Her iron levels are also well within the normal range.  I do believe this is secondary to the iron infusions she received earlier this year.  Please schedule follow-up visit in our clinic in 6 months time with labs 1 week before. ----- Message ----- From: Interface, Lab In [place] Sent: 11/29/2022   3:02 PM EDT To: Jaci Standard, MD

## 2022-12-01 NOTE — Telephone Encounter (Signed)
Left a message in regards for upcoming follow up appointments

## 2022-12-01 NOTE — Telephone Encounter (Signed)
TCT patient regarding recent lab results. Spoke with her. Advised that her hemoglobin and platelet levels are back to normal. Her iron levels are also well within the normal range. Dr. Leonides Schanz believes this is secondary to the iron infusions she received earlier this year. Please schedule follow-up visit in our clinic in 6 months time with labs 1 week before.  Pt voiced understanding to the above. Scheduling message sent for f/u appts

## 2022-12-02 ENCOUNTER — Other Ambulatory Visit (HOSPITAL_COMMUNITY): Payer: Self-pay

## 2022-12-06 ENCOUNTER — Other Ambulatory Visit: Payer: Self-pay

## 2022-12-06 ENCOUNTER — Other Ambulatory Visit (HOSPITAL_COMMUNITY): Payer: Self-pay

## 2022-12-06 MED FILL — Tizanidine HCl Tab 4 MG (Base Equivalent): ORAL | 30 days supply | Qty: 180 | Fill #0 | Status: AC

## 2022-12-11 ENCOUNTER — Other Ambulatory Visit (HOSPITAL_COMMUNITY): Payer: Self-pay

## 2022-12-12 ENCOUNTER — Other Ambulatory Visit (HOSPITAL_COMMUNITY): Payer: Self-pay

## 2022-12-13 ENCOUNTER — Other Ambulatory Visit (HOSPITAL_COMMUNITY): Payer: Self-pay

## 2022-12-13 MED ORDER — GABAPENTIN 300 MG/6ML PO SOLN
ORAL | 0 refills | Status: DC
  Filled 2022-12-13: qty 1620, 30d supply, fill #0

## 2022-12-13 MED ORDER — BACLOFEN 10 MG PO TABS
10.0000 mg | ORAL_TABLET | ORAL | 0 refills | Status: DC | PRN
  Filled 2023-01-23: qty 30, 30d supply, fill #0

## 2022-12-14 ENCOUNTER — Other Ambulatory Visit (HOSPITAL_COMMUNITY): Payer: Self-pay

## 2022-12-15 ENCOUNTER — Other Ambulatory Visit (HOSPITAL_COMMUNITY): Payer: Self-pay

## 2022-12-23 ENCOUNTER — Other Ambulatory Visit: Payer: Self-pay

## 2022-12-31 ENCOUNTER — Other Ambulatory Visit (HOSPITAL_COMMUNITY): Payer: Self-pay

## 2022-12-31 MED ORDER — GABAPENTIN 300 MG/6ML PO SOLN
ORAL | 0 refills | Status: DC
  Filled 2022-12-31 – 2023-01-19 (×2): qty 1620, 30d supply, fill #0

## 2022-12-31 MED ORDER — OXYCODONE HCL 10 MG PO TABS
10.0000 mg | ORAL_TABLET | Freq: Every day | ORAL | 0 refills | Status: DC | PRN
  Filled 2022-12-31 – 2023-01-05 (×3): qty 150, 30d supply, fill #0

## 2023-01-02 ENCOUNTER — Other Ambulatory Visit (HOSPITAL_COMMUNITY): Payer: Self-pay

## 2023-01-04 ENCOUNTER — Other Ambulatory Visit (HOSPITAL_COMMUNITY): Payer: Self-pay

## 2023-01-05 ENCOUNTER — Other Ambulatory Visit: Payer: Self-pay

## 2023-01-05 ENCOUNTER — Other Ambulatory Visit (HOSPITAL_COMMUNITY): Payer: Self-pay

## 2023-01-05 MED ORDER — MUPIROCIN 2 % EX OINT
TOPICAL_OINTMENT | CUTANEOUS | 0 refills | Status: DC
  Filled 2023-01-05: qty 22, 14d supply, fill #0

## 2023-01-05 MED ORDER — PROMETHAZINE HCL 12.5 MG PO TABS
12.5000 mg | ORAL_TABLET | ORAL | 0 refills | Status: DC | PRN
  Filled 2023-01-05: qty 15, 5d supply, fill #0

## 2023-01-06 ENCOUNTER — Other Ambulatory Visit (HOSPITAL_COMMUNITY): Payer: Self-pay

## 2023-01-11 ENCOUNTER — Other Ambulatory Visit (HOSPITAL_COMMUNITY): Payer: Self-pay

## 2023-01-11 MED ORDER — OXYCODONE HCL 15 MG PO TABS
ORAL_TABLET | ORAL | 0 refills | Status: DC
  Filled 2023-01-11: qty 28, 7d supply, fill #0

## 2023-01-11 MED ORDER — ACETAMINOPHEN 500 MG PO TABS
ORAL_TABLET | ORAL | 0 refills | Status: DC
  Filled 2023-01-11: qty 30, 10d supply, fill #0

## 2023-01-11 MED ORDER — METHYLPREDNISOLONE 4 MG PO TBPK
ORAL_TABLET | ORAL | 0 refills | Status: DC
  Filled 2023-01-11: qty 21, 6d supply, fill #0

## 2023-01-17 ENCOUNTER — Other Ambulatory Visit (HOSPITAL_COMMUNITY): Payer: Self-pay

## 2023-01-19 ENCOUNTER — Other Ambulatory Visit: Payer: Self-pay

## 2023-01-19 ENCOUNTER — Other Ambulatory Visit (HOSPITAL_COMMUNITY): Payer: Self-pay

## 2023-01-20 ENCOUNTER — Other Ambulatory Visit (HOSPITAL_COMMUNITY): Payer: Self-pay

## 2023-01-20 MED ORDER — OXYCODONE HCL 10 MG PO TABS
ORAL_TABLET | ORAL | 0 refills | Status: DC
Start: 2023-01-20 — End: 2023-06-16
  Filled 2023-01-20: qty 42, 7d supply, fill #0

## 2023-01-22 ENCOUNTER — Other Ambulatory Visit (HOSPITAL_COMMUNITY): Payer: Self-pay

## 2023-01-23 ENCOUNTER — Other Ambulatory Visit (HOSPITAL_COMMUNITY): Payer: Self-pay

## 2023-01-24 ENCOUNTER — Other Ambulatory Visit: Payer: Self-pay

## 2023-01-24 ENCOUNTER — Other Ambulatory Visit (HOSPITAL_COMMUNITY): Payer: Self-pay

## 2023-01-25 ENCOUNTER — Other Ambulatory Visit (HOSPITAL_COMMUNITY): Payer: Self-pay

## 2023-01-26 ENCOUNTER — Other Ambulatory Visit (HOSPITAL_COMMUNITY): Payer: Self-pay

## 2023-01-26 MED ORDER — PROMETHAZINE HCL 12.5 MG PO TABS
12.5000 mg | ORAL_TABLET | ORAL | 0 refills | Status: DC
  Filled 2023-01-26: qty 15, 5d supply, fill #0

## 2023-01-28 ENCOUNTER — Other Ambulatory Visit (HOSPITAL_COMMUNITY): Payer: Self-pay

## 2023-01-29 ENCOUNTER — Other Ambulatory Visit (HOSPITAL_COMMUNITY): Payer: Self-pay

## 2023-01-29 ENCOUNTER — Other Ambulatory Visit: Payer: Self-pay | Admitting: Neurology

## 2023-01-30 ENCOUNTER — Other Ambulatory Visit (HOSPITAL_COMMUNITY): Payer: Self-pay

## 2023-01-30 MED ORDER — MONTELUKAST SODIUM 10 MG PO TABS
10.0000 mg | ORAL_TABLET | Freq: Every evening | ORAL | 0 refills | Status: DC
  Filled 2023-01-30: qty 90, 90d supply, fill #0

## 2023-01-30 MED ORDER — MONTELUKAST SODIUM 10 MG PO TABS
10.0000 mg | ORAL_TABLET | Freq: Every evening | ORAL | 0 refills | Status: DC
  Filled 2023-04-27: qty 90, 90d supply, fill #0

## 2023-01-31 ENCOUNTER — Other Ambulatory Visit: Payer: Self-pay

## 2023-01-31 ENCOUNTER — Other Ambulatory Visit (HOSPITAL_COMMUNITY): Payer: Self-pay

## 2023-01-31 MED ORDER — TIZANIDINE HCL 4 MG PO TABS
4.0000 mg | ORAL_TABLET | Freq: Three times a day (TID) | ORAL | 1 refills | Status: DC | PRN
  Filled 2023-01-31: qty 360, 60d supply, fill #0
  Filled 2023-03-26: qty 360, 60d supply, fill #1

## 2023-02-01 ENCOUNTER — Other Ambulatory Visit (HOSPITAL_COMMUNITY): Payer: Self-pay

## 2023-02-01 ENCOUNTER — Other Ambulatory Visit: Payer: Self-pay

## 2023-02-01 MED ORDER — FUROSEMIDE 20 MG PO TABS
20.0000 mg | ORAL_TABLET | Freq: Every day | ORAL | 1 refills | Status: DC
  Filled 2023-02-01 – 2023-04-27 (×2): qty 90, 90d supply, fill #0

## 2023-02-01 MED ORDER — PANTOPRAZOLE SODIUM 40 MG PO TBEC
40.0000 mg | DELAYED_RELEASE_TABLET | Freq: Every day | ORAL | 1 refills | Status: DC
  Filled 2023-02-01 – 2023-04-27 (×2): qty 90, 90d supply, fill #0

## 2023-02-01 MED ORDER — AMITRIPTYLINE HCL 100 MG PO TABS
100.0000 mg | ORAL_TABLET | Freq: Every evening | ORAL | 1 refills | Status: DC | PRN
  Filled 2023-02-01 – 2023-03-16 (×3): qty 90, 90d supply, fill #0
  Filled 2023-04-27: qty 90, 90d supply, fill #1

## 2023-02-01 MED ORDER — ALBUTEROL SULFATE HFA 108 (90 BASE) MCG/ACT IN AERS
2.0000 | INHALATION_SPRAY | RESPIRATORY_TRACT | 0 refills | Status: DC | PRN
  Filled 2023-02-01: qty 1, 30d supply, fill #0
  Filled 2023-02-11: qty 1, 25d supply, fill #0
  Filled 2023-05-20: qty 6.7, 30d supply, fill #0

## 2023-02-01 MED ORDER — GABAPENTIN 300 MG/6ML PO SOLN
Freq: Three times a day (TID) | ORAL | 0 refills | Status: DC
  Filled 2023-04-27: qty 1620, 30d supply, fill #0

## 2023-02-01 MED ORDER — BACLOFEN 10 MG PO TABS
10.0000 mg | ORAL_TABLET | ORAL | 0 refills | Status: DC | PRN
  Filled 2023-02-01 – 2023-03-09 (×3): qty 30, 30d supply, fill #0

## 2023-02-01 MED ORDER — OXYCODONE HCL 10 MG PO TABS
10.0000 mg | ORAL_TABLET | Freq: Every day | ORAL | 0 refills | Status: DC
  Filled 2023-02-01: qty 150, 30d supply, fill #0

## 2023-02-01 MED ORDER — IRON 325 (65 FE) MG PO TABS
1.0000 | ORAL_TABLET | Freq: Every day | ORAL | 1 refills | Status: DC
  Filled 2023-02-01 – 2023-04-27 (×2): qty 90, 90d supply, fill #0

## 2023-02-01 MED ORDER — ONDANSETRON 8 MG PO TBDP
8.0000 mg | ORAL_TABLET | Freq: Every day | ORAL | 0 refills | Status: DC | PRN
  Filled 2023-02-01: qty 30, 30d supply, fill #0

## 2023-02-01 MED ORDER — MONTELUKAST SODIUM 10 MG PO TABS
10.0000 mg | ORAL_TABLET | Freq: Every evening | ORAL | 0 refills | Status: DC
  Filled 2023-02-01 – 2023-04-27 (×2): qty 90, 90d supply, fill #0

## 2023-02-02 ENCOUNTER — Other Ambulatory Visit (HOSPITAL_COMMUNITY): Payer: Self-pay

## 2023-02-02 MED ORDER — METHYLPREDNISOLONE 4 MG PO TBPK
ORAL_TABLET | ORAL | 0 refills | Status: DC
  Filled 2023-02-02: qty 21, 6d supply, fill #0

## 2023-02-04 ENCOUNTER — Other Ambulatory Visit (HOSPITAL_COMMUNITY): Payer: Self-pay

## 2023-02-04 MED ORDER — EPINEPHRINE 0.3 MG/0.3ML IJ SOAJ
INTRAMUSCULAR | 4 refills | Status: DC
  Filled 2023-02-04: qty 2, 10d supply, fill #0
  Filled 2023-03-09: qty 2, 10d supply, fill #1

## 2023-02-05 ENCOUNTER — Other Ambulatory Visit (HOSPITAL_COMMUNITY): Payer: Self-pay

## 2023-02-11 ENCOUNTER — Other Ambulatory Visit (HOSPITAL_COMMUNITY): Payer: Self-pay

## 2023-02-23 ENCOUNTER — Emergency Department (HOSPITAL_COMMUNITY)
Admission: EM | Admit: 2023-02-23 | Discharge: 2023-02-24 | Disposition: A | Discharge status: Home / Self Care | Payer: Medicare HMO | Attending: Emergency Medicine | Admitting: Emergency Medicine

## 2023-02-23 ENCOUNTER — Emergency Department (HOSPITAL_COMMUNITY): Payer: Medicare HMO

## 2023-02-23 ENCOUNTER — Other Ambulatory Visit: Payer: Self-pay

## 2023-02-23 ENCOUNTER — Encounter (HOSPITAL_COMMUNITY): Payer: Self-pay | Admitting: *Deleted

## 2023-02-23 DIAGNOSIS — R112 Nausea with vomiting, unspecified: Secondary | ICD-10-CM | POA: Insufficient documentation

## 2023-02-23 DIAGNOSIS — M25571 Pain in right ankle and joints of right foot: Secondary | ICD-10-CM | POA: Diagnosis not present

## 2023-02-23 LAB — URINALYSIS, ROUTINE W REFLEX MICROSCOPIC
Bilirubin Urine: NEGATIVE
Glucose, UA: NEGATIVE mg/dL
Hgb urine dipstick: NEGATIVE
Ketones, ur: NEGATIVE mg/dL
Nitrite: NEGATIVE
Protein, ur: NEGATIVE mg/dL
Specific Gravity, Urine: 1.008 (ref 1.005–1.030)
pH: 6 (ref 5.0–8.0)

## 2023-02-23 LAB — COMPREHENSIVE METABOLIC PANEL
ALT: 25 U/L (ref 0–44)
AST: 24 U/L (ref 15–41)
Albumin: 3.3 g/dL — ABNORMAL LOW (ref 3.5–5.0)
Alkaline Phosphatase: 117 U/L (ref 38–126)
Anion gap: 12 (ref 5–15)
BUN: 8 mg/dL (ref 6–20)
CO2: 24 mmol/L (ref 22–32)
Calcium: 8.6 mg/dL — ABNORMAL LOW (ref 8.9–10.3)
Chloride: 101 mmol/L (ref 98–111)
Creatinine, Ser: 0.65 mg/dL (ref 0.44–1.00)
GFR, Estimated: >60 mL/min (ref >60)
Glucose, Bld: 103 mg/dL — ABNORMAL HIGH (ref 70–99)
Potassium: 3.5 mmol/L (ref 3.5–5.1)
Sodium: 137 mmol/L (ref 135–145)
Total Bilirubin: 0.4 mg/dL (ref 0.3–1.2)
Total Protein: 7.3 g/dL (ref 6.5–8.1)

## 2023-02-23 LAB — CBC
HCT: 35.6 % — ABNORMAL LOW (ref 36.0–46.0)
Hemoglobin: 10.8 g/dL — ABNORMAL LOW (ref 12.0–15.0)
MCH: 24.2 pg — ABNORMAL LOW (ref 26.0–34.0)
MCHC: 30.3 g/dL (ref 30.0–36.0)
MCV: 79.8 fL — ABNORMAL LOW (ref 80.0–100.0)
Platelets: 344 10*3/uL (ref 150–400)
RBC: 4.46 MIL/uL (ref 3.87–5.11)
RDW: 19.7 % — ABNORMAL HIGH (ref 11.5–15.5)
WBC: 8.1 10*3/uL (ref 4.0–10.5)
nRBC: 0 % (ref 0.0–0.2)

## 2023-02-23 LAB — LIPASE, BLOOD: Lipase: 69 U/L — ABNORMAL HIGH (ref 11–51)

## 2023-02-23 MED ORDER — FENTANYL CITRATE PF 50 MCG/ML IJ SOSY
50.0000 ug | PREFILLED_SYRINGE | Freq: Once | INTRAMUSCULAR | Status: AC
  Administered 2023-02-23: 50 ug via INTRAVENOUS
  Filled 2023-02-23: qty 1

## 2023-02-23 MED ORDER — ONDANSETRON HCL 4 MG/2ML IJ SOLN
4.0000 mg | Freq: Once | INTRAMUSCULAR | Status: AC
Start: 2023-02-23 — End: 2023-02-23
  Administered 2023-02-23: 4 mg via INTRAVENOUS
  Filled 2023-02-23: qty 2

## 2023-02-23 MED ORDER — IOHEXOL 350 MG/ML SOLN
75.0000 mL | Freq: Once | INTRAVENOUS | Status: AC | PRN
  Administered 2023-02-23: 75 mL via INTRAVENOUS

## 2023-02-23 NOTE — ED Notes (Signed)
PT ambulated to the bathroom.

## 2023-02-23 NOTE — ED Triage Notes (Signed)
The pt report that she is a bariatric pt and she has ms  she feels dehydrated  and feeling weak  she also reports that she has fallen several times this past week  she has been to [facility] for the same     she took a sl zoran  around 1600

## 2023-02-23 NOTE — ED Provider Notes (Signed)
  Physical Exam  BP 137/74 (BP Location: Left Arm)   Pulse 95   Temp 98.6 F (37 C) (Oral)   Resp 18   Ht 5\' 4"  (1.626 m)   Wt 86.1 kg   SpO2 99%   BMI 32.58 kg/m   Physical Exam  Procedures  Procedures  ED Course / MDM    Medical Decision Making Amount and/or Complexity of Data Reviewed Labs: ordered. Radiology: ordered.  Risk Prescription drug management.    Care of the patient assumed from preceding ED provider Marchelle Folks, PA-C. Please see her associated note for further insight of the patient's ED course.  In brief patient presented with abdominal pain nausea vomiting.  Concern for portal vein thrombosis on imaging.  At time of shift change pending liver ultrasound.  Liver ultrasound negative for thrombosis.  Suspect viral gastroenteritis.  Clinical concern for emergent underlying etiology of this patient and without would warrant further ED workup and patient management is exceedingly low.  Cindy Hoffman  voiced understanding of her medical evaluation and treatment plan. Each of their questions answered to their expressed satisfaction.  Return precautions were given.  Patient is well-appearing, stable, and was discharged in good condition.  This chart was dictated using voice recognition software, Dragon. Despite the best efforts of this provider to proofread and correct errors, errors may still occur which can change documentation meaning.     Paris Lore, PA-C 02/24/23 [id number]    Gilda Crease, MD 02/25/23 [phone]

## 2023-02-23 NOTE — ED Provider Notes (Signed)
[place] EMERGENCY DEPARTMENT AT [facility] Provider Note   CSN: [id number] Arrival date & time: 02/23/23  1555     History {Add pertinent medical, surgical, social history, OB history to HPI:1} Chief Complaint  Patient presents with   Emesis    Cindy Hoffman is a 47 y.o. female with history of gastric Roux-en-Y in 2018, presents with concern for nausea and vomiting over the last 2 days.  Denies any fever or chills, no diarrhea, or constipation.  Reports she had a fall 2 days ago, and sprained her right ankle.  Since then, has been taking lots of Tylenol.  Reports taking 1500mg  Tylenol every couple of hours for her ankle pain.   Emesis      Home Medications Prior to Admission medications   Medication Sig Start Date End Date Taking? Authorizing Provider  acetaminophen (TYLENOL) 500 MG tablet Take two tablets (1,000 mg dose) by mouth every 8 (eight) hours for 10 days. 01/11/23     albuterol (PROVENTIL HFA;VENTOLIN HFA) 108 (90 BASE) MCG/ACT inhaler Inhale 2 puffs into the lungs every 6 (six) hours as needed for wheezing or shortness of breath.     [provider]  albuterol (PROVENTIL) (2.5 MG/3ML) 0.083% nebulizer solution Take 2.5 mg by nebulization every 6 (six) hours as needed for wheezing or shortness of breath.    [provider]  Albuterol Sulfate (PROAIR RESPICLICK) 108 (90 Base) MCG/ACT AEPB Inhale 2 puffs into the lungs as needed. 02/01/23     amitriptyline (ELAVIL) 100 MG tablet Take 1 tablet (100 mg total) by mouth at bedtime as needed. 10/13/22     amitriptyline (ELAVIL) 100 MG tablet Take 1 tablet (100 mg total) by mouth at bedtime as needed. 02/01/23     baclofen (LIORESAL) 10 MG tablet Take 1 tablet (10 mg total) by mouth daily as needed. 10/13/22     baclofen (LIORESAL) 10 MG tablet Take 1 tablet (10 mg) by mouth as needed. 12/12/22     baclofen (LIORESAL) 10 MG tablet Take 1 tablet (10 mg total) by mouth as needed. 02/01/23     EPINEPHrine  (EPIPEN 2-PAK) 0.3 mg/0.3 mL IJ SOAJ injection Inject into the muscle every 5 to 15 minutes as needed for hypersensitivity reaction; do not exceed 3 doses per episode 02/04/23     Ferrous Sulfate (IRON) 325 (65 Fe) MG TABS Take 1 tablet (325 mg total) by mouth daily. 02/01/23     furosemide (LASIX) 20 MG tablet Take 1 tablet (20 mg total) by mouth daily. 02/01/23     gabapentin (NEURONTIN) 300 MG/6ML solution Take 18 mLs (900 mg total) by mouth 3 times daily. 10/13/22     gabapentin (NEURONTIN) 300 MG/6ML solution Take 18 mls by mouth 3 times a day 12/12/22     gabapentin (NEURONTIN) 300 MG/6ML solution Take 18 ml's by mouth 3 (three) times daily. 02/01/23     Galcanezumab-gnlm (EMGALITY) 120 MG/ML SOAJ Inject 120 mg into the skin every 30 (thirty) days. 01/09/22   Anson Fret, MD  methylPREDNISolone (MEDROL DOSEPAK) 4 MG TBPK tablet Follow package directions 02/02/23     montelukast (SINGULAIR) 10 MG tablet Take 1 tablet (10 mg total) by mouth every evening. 01/30/23     montelukast (SINGULAIR) 10 MG tablet Take 1 tablet (10 mg total) by mouth every evening. 01/30/23     montelukast (SINGULAIR) 10 MG tablet Take 1 tablet (10 mg total) by mouth every evening. 02/01/23     MOVANTIK 25  MG TABS tablet Take 25 mg by mouth at bedtime. 04/12/19   [provider]  mupirocin ointment (BACTROBAN) 2 % Apply intranasally to both nostrils twice daily for 5 days 01/05/23     naloxegol oxalate (MOVANTIK) 25 MG TABS tablet Take 1 tablet (25 mg total) by mouth daily as needed. 11/01/22     ondansetron (ZOFRAN-ODT) 8 MG disintegrating tablet Dissolve 1 tablet (8 mg total) by mouth daily as needed. 10/13/22     ondansetron (ZOFRAN-ODT) 8 MG disintegrating tablet Take 1 tablet (8 mg total) by mouth daily as needed. 02/01/23     Oxycodone HCl 10 MG TABS Take 1 tablet (10 mg total) by mouth 5 (five) times daily as needed (max daily dose 5 tablets) 11/01/22     Oxycodone HCl 10 MG TABS Take 1 tablet (10 mg total) by mouth 5  (five) times daily as needed. (max daily dose: 5 Tablets) 12/01/22     Oxycodone HCl 10 MG TABS Take 1 tablet (10 mg total) by mouth 5 (five) times daily as needed. 01/05/23 12/30/22     Oxycodone HCl 10 MG TABS Take one tablet (10 mg dose) by mouth every 4 (four) hours as needed for Pain for up to 7 days. 01/20/23     Oxycodone HCl 10 MG TABS Take 1 tablet (10 mg total) by mouth 5 (five) times daily as needed. 01/31/23     pantoprazole (PROTONIX) 40 MG tablet Take 1 tablet (40 mg total) by mouth daily as directed 02/01/23     PARoxetine (PAXIL) 10 MG tablet Take 1 tablet (10 mg total) by mouth daily. 10/26/22     promethazine (PHENERGAN) 12.5 MG tablet Take 1 (one) tablet by mouth as needed, max daily dose: 3 Tablets 01/26/23     tiZANidine (ZANAFLEX) 4 MG tablet Take 1-2 tablets (4-8 mg total) by mouth every 8 (eight) hours as needed FOR MUSCLE SPASM (NEED OFFICE VISIT) 01/31/23   Anson Fret, MD      Allergies    Beta adrenergic blockers, Nsaids, and Tolmetin    Review of Systems   Review of Systems  Gastrointestinal:  Positive for vomiting.    Physical Exam Updated Vital Signs BP 136/84 (BP Location: Left Arm)   Pulse (!) 107   Temp 98.1 F (36.7 C)   Resp 18   Ht 5\' 4"  (1.626 m)   Wt 86.1 kg   SpO2 100%   BMI 32.58 kg/m  Physical Exam Vitals and nursing note reviewed.  Constitutional:      General: She is not in acute distress.    Appearance: She is well-developed.     Comments: Patient well-appearing, no active emesis  HENT:     Head: Normocephalic and atraumatic.  Eyes:     Conjunctiva/sclera: Conjunctivae normal.  Cardiovascular:     Rate and Rhythm: Normal rate and regular rhythm.     Heart sounds: No murmur heard. Pulmonary:     Effort: Pulmonary effort is normal. No respiratory distress.     Breath sounds: Normal breath sounds.  Abdominal:     Palpations: Abdomen is soft.     Tenderness: There is no abdominal tenderness.     Comments: Abdomen soft and  nontender to palpation  Musculoskeletal:        General: No swelling.     Cervical back: Neck supple.  Skin:    General: Skin is warm and dry.     Capillary Refill: Capillary refill takes less than 2 seconds.  Neurological:     Mental Status: She is alert.  Psychiatric:        Mood and Affect: Mood normal.     ED Results / Procedures / Treatments   Labs (all labs ordered are listed, but only abnormal results are displayed) Labs Reviewed  LIPASE, BLOOD - Abnormal; Notable for the following components:      Result Value   Lipase 69 (*)    All other components within normal limits  COMPREHENSIVE METABOLIC PANEL - Abnormal; Notable for the following components:   Glucose, Bld 103 (*)    Calcium 8.6 (*)    Albumin 3.3 (*)    All other components within normal limits  CBC - Abnormal; Notable for the following components:   Hemoglobin 10.8 (*)    HCT 35.6 (*)    MCV 79.8 (*)    MCH 24.2 (*)    RDW 19.7 (*)    All other components within normal limits  URINALYSIS, ROUTINE W REFLEX MICROSCOPIC - Abnormal; Notable for the following components:   Leukocytes,Ua SMALL (*)    Bacteria, UA RARE (*)    All other components within normal limits  PREGNANCY, URINE    EKG None  Radiology No results found.  Procedures Procedures  {Document cardiac monitor, telemetry assessment procedure when appropriate:1}  Medications Ordered in ED Medications - No data to display  ED Course/ Medical Decision Making/ A&P   {   Click here for ABCD2, HEART and other calculatorsREFRESH Note before signing :1}                              Medical Decision Making Amount and/or Complexity of Data Reviewed Labs: ordered. Radiology: ordered.  Risk Prescription drug management.     Differential diagnosis includes but is not limited to URI, gastroenteritis, pancreatitis, pregnancy  ED Course:  Patient overall well-appearing in no acute distress Upon re-evaluation, patient ***.     Impression: ***  Disposition:  {AF ED Dispo:29713} Return precautions given.  Lab Tests: I Ordered, and personally interpreted labs.  The pertinent results include:  ***  Imaging Studies ordered: I ordered imaging studies including ***  I independently visualized the imaging with scope of interpretation limited to determining acute life threatening conditions related to emergency care. Imaging showed *** I agree with the radiologist interpretation   Cardiac Monitoring: / EKG: The patient was maintained on a cardiac monitor.  I personally viewed and interpreted the cardiac monitored which showed an underlying rhythm of: ***   Consultations Obtained: I requested consultation with the ***,  and discussed lab and imaging findings as well as pertinent plan - they recommend: ***Additional history obtained from ***  External records from outside source obtained and reviewed including ***   Co morbidities that complicate the patient evaluation  ***  Social Determinants of Health:  {HQIO:96295}       {Document critical care time when appropriate:1} {Document review of labs and clinical decision tools ie heart score, Chads2Vasc2 etc:1}  {Document your independent review of radiology images, and any outside records:1} {Document your discussion with family members, caretakers, and with consultants:1} {Document social determinants of health affecting pt's care:1} {Document your decision making why or why not admission, treatments were needed:1} Final Clinical Impression(s) / ED Diagnoses Final diagnoses:  None    Rx / DC Orders ED Discharge Orders     None

## 2023-02-24 ENCOUNTER — Emergency Department (HOSPITAL_COMMUNITY): Payer: Medicare HMO

## 2023-02-24 DIAGNOSIS — R112 Nausea with vomiting, unspecified: Secondary | ICD-10-CM | POA: Diagnosis not present

## 2023-02-24 NOTE — Discharge Instructions (Signed)
You were seen in the ER today for abdominal pain nausea and vomiting.  While the exact cause of your symptoms remains unclear, there does not appear to be any emergent problem at this time.  Follow-up with your primary care doctor return to the ER with any new severe symptoms.

## 2023-02-24 NOTE — ED Notes (Signed)
Pt returned from US

## 2023-02-25 ENCOUNTER — Encounter (HOSPITAL_COMMUNITY): Payer: Self-pay | Admitting: Emergency Medicine

## 2023-02-25 ENCOUNTER — Emergency Department (HOSPITAL_COMMUNITY): Payer: Medicare HMO

## 2023-02-25 ENCOUNTER — Emergency Department (HOSPITAL_COMMUNITY)
Admission: EM | Admit: 2023-02-25 | Discharge: 2023-02-26 | Disposition: A | Discharge status: Home / Self Care | Payer: Medicare HMO | Attending: Emergency Medicine | Admitting: Emergency Medicine

## 2023-02-25 ENCOUNTER — Other Ambulatory Visit: Payer: Self-pay

## 2023-02-25 DIAGNOSIS — R112 Nausea with vomiting, unspecified: Secondary | ICD-10-CM

## 2023-02-25 DIAGNOSIS — J45909 Unspecified asthma, uncomplicated: Secondary | ICD-10-CM | POA: Diagnosis not present

## 2023-02-25 DIAGNOSIS — W19XXXA Unspecified fall, initial encounter: Secondary | ICD-10-CM | POA: Diagnosis not present

## 2023-02-25 DIAGNOSIS — S99911A Unspecified injury of right ankle, initial encounter: Secondary | ICD-10-CM | POA: Diagnosis present

## 2023-02-25 DIAGNOSIS — S93401A Sprain of unspecified ligament of right ankle, initial encounter: Secondary | ICD-10-CM | POA: Insufficient documentation

## 2023-02-25 LAB — CBC
HCT: 34.2 % — ABNORMAL LOW (ref 36.0–46.0)
Hemoglobin: 10.4 g/dL — ABNORMAL LOW (ref 12.0–15.0)
MCH: 25.4 pg — ABNORMAL LOW (ref 26.0–34.0)
MCHC: 30.4 g/dL (ref 30.0–36.0)
MCV: 83.6 fL (ref 80.0–100.0)
Platelets: 391 10*3/uL (ref 150–400)
RBC: 4.09 MIL/uL (ref 3.87–5.11)
RDW: 19.9 % — ABNORMAL HIGH (ref 11.5–15.5)
WBC: 5.9 10*3/uL (ref 4.0–10.5)
nRBC: 0 % (ref 0.0–0.2)

## 2023-02-25 LAB — COMPREHENSIVE METABOLIC PANEL
ALT: 20 U/L (ref 0–44)
AST: 21 U/L (ref 15–41)
Albumin: 3.2 g/dL — ABNORMAL LOW (ref 3.5–5.0)
Alkaline Phosphatase: 119 U/L (ref 38–126)
Anion gap: 6 (ref 5–15)
BUN: 8 mg/dL (ref 6–20)
CO2: 23 mmol/L (ref 22–32)
Calcium: 8 mg/dL — ABNORMAL LOW (ref 8.9–10.3)
Chloride: 105 mmol/L (ref 98–111)
Creatinine, Ser: 0.57 mg/dL (ref 0.44–1.00)
GFR, Estimated: >60 mL/min (ref >60)
Glucose, Bld: 105 mg/dL — ABNORMAL HIGH (ref 70–99)
Potassium: 3.7 mmol/L (ref 3.5–5.1)
Sodium: 134 mmol/L — ABNORMAL LOW (ref 135–145)
Total Bilirubin: 0.2 mg/dL — ABNORMAL LOW (ref 0.3–1.2)
Total Protein: 6.8 g/dL (ref 6.5–8.1)

## 2023-02-25 LAB — URINALYSIS, ROUTINE W REFLEX MICROSCOPIC
Bacteria, UA: NONE SEEN
Bilirubin Urine: NEGATIVE
Glucose, UA: NEGATIVE mg/dL
Hgb urine dipstick: NEGATIVE
Ketones, ur: NEGATIVE mg/dL
Nitrite: NEGATIVE
Protein, ur: NEGATIVE mg/dL
Specific Gravity, Urine: 1.023 (ref 1.005–1.030)
pH: 6 (ref 5.0–8.0)

## 2023-02-25 LAB — HCG, SERUM, QUALITATIVE: Preg, Serum: NEGATIVE

## 2023-02-25 LAB — LIPASE, BLOOD: Lipase: 66 U/L — ABNORMAL HIGH (ref 11–51)

## 2023-02-25 MED ORDER — ONDANSETRON 4 MG PO TBDP
4.0000 mg | ORAL_TABLET | Freq: Once | ORAL | Status: AC | PRN
  Administered 2023-02-25: 4 mg via ORAL
  Filled 2023-02-25: qty 1

## 2023-02-25 NOTE — ED Triage Notes (Signed)
Pt reports right ankle pain after a fall. Pt also reports potentially having gallstones. Pt reports abd pain and nausea x a few days.

## 2023-02-26 MED ORDER — ACETAMINOPHEN 325 MG PO TABS
650.0000 mg | ORAL_TABLET | Freq: Once | ORAL | Status: AC
  Administered 2023-02-26: 650 mg via ORAL
  Filled 2023-02-26: qty 2

## 2023-02-26 NOTE — Discharge Instructions (Addendum)
Follow up with your GI doctor. Follow up with orthopedics for ankle injury. Wear boot, you can apply ice over a thin cloth and elevate to help with pain and swelling. Avoid NSAIDs. Bland diet. Return to the ER for worsening or concerning symptoms.

## 2023-02-26 NOTE — ED Provider Notes (Signed)
[place] EMERGENCY DEPARTMENT AT [facility] Provider Note   CSN: [id number] Arrival date & time: 02/25/23  1850     History  Chief Complaint  Patient presents with   Abdominal Pain   Emesis   Ankle Pain    Cindy Hoffman is a 47 y.o. female.  47 year old female with history of MS, asthma, GERD, chronic pain/myofacial muscle pain, OSA, gastric ulcer, autoimmune encephalitis presents with father for right ankle pain and vomiting. Patient reports fall injuring her right ankle a few days ago, seen at [facility] with negative ankle XR at that time, placed in a tall CAM boot, ongoing pain and swelling, not currently scheduled to see ortho. Also with complaint of mid right abdominal pain and vomiting and nausea. Patient is taking Zofran and phenergan without much improvement. Last ate green beans and bread while waiting to be seen, post zofran, no vomiting do far. No changes in bowel or bladder habits (possibly slightly less urine output). Denies fevers, chills, sick contacts. History of gastric ulcer, is on Protonix nightly.        Home Medications Prior to Admission medications   Medication Sig Start Date End Date Taking? Authorizing Provider  acetaminophen (TYLENOL) 500 MG tablet Take two tablets (1,000 mg dose) by mouth every 8 (eight) hours for 10 days. 01/11/23     albuterol (PROVENTIL HFA;VENTOLIN HFA) 108 (90 BASE) MCG/ACT inhaler Inhale 2 puffs into the lungs every 6 (six) hours as needed for wheezing or shortness of breath.     [provider]  albuterol (PROVENTIL) (2.5 MG/3ML) 0.083% nebulizer solution Take 2.5 mg by nebulization every 6 (six) hours as needed for wheezing or shortness of breath.    [provider]  Albuterol Sulfate (PROAIR RESPICLICK) 108 (90 Base) MCG/ACT AEPB Inhale 2 puffs into the lungs as needed. 02/01/23     amitriptyline (ELAVIL) 100 MG tablet Take 1 tablet (100 mg total) by mouth at bedtime as needed. 10/13/22     amitriptyline  (ELAVIL) 100 MG tablet Take 1 tablet (100 mg total) by mouth at bedtime as needed. 02/01/23     baclofen (LIORESAL) 10 MG tablet Take 1 tablet (10 mg total) by mouth daily as needed. 10/13/22     baclofen (LIORESAL) 10 MG tablet Take 1 tablet (10 mg) by mouth as needed. 12/12/22     baclofen (LIORESAL) 10 MG tablet Take 1 tablet (10 mg total) by mouth as needed. 02/01/23     EPINEPHrine (EPIPEN 2-PAK) 0.3 mg/0.3 mL IJ SOAJ injection Inject into the muscle every 5 to 15 minutes as needed for hypersensitivity reaction; do not exceed 3 doses per episode 02/04/23     Ferrous Sulfate (IRON) 325 (65 Fe) MG TABS Take 1 tablet (325 mg total) by mouth daily. 02/01/23     furosemide (LASIX) 20 MG tablet Take 1 tablet (20 mg total) by mouth daily. 02/01/23     gabapentin (NEURONTIN) 300 MG/6ML solution Take 18 mLs (900 mg total) by mouth 3 times daily. 10/13/22     gabapentin (NEURONTIN) 300 MG/6ML solution Take 18 mls by mouth 3 times a day 12/12/22     gabapentin (NEURONTIN) 300 MG/6ML solution Take 18 ml's by mouth 3 (three) times daily. 02/01/23     Galcanezumab-gnlm (EMGALITY) 120 MG/ML SOAJ Inject 120 mg into the skin every 30 (thirty) days. 01/09/22   Anson Fret, MD  methylPREDNISolone (MEDROL DOSEPAK) 4 MG TBPK tablet Follow package directions 02/02/23  montelukast (SINGULAIR) 10 MG tablet Take 1 tablet (10 mg total) by mouth every evening. 01/30/23     montelukast (SINGULAIR) 10 MG tablet Take 1 tablet (10 mg total) by mouth every evening. 01/30/23     montelukast (SINGULAIR) 10 MG tablet Take 1 tablet (10 mg total) by mouth every evening. 02/01/23     MOVANTIK 25 MG TABS tablet Take 25 mg by mouth at bedtime. 04/12/19   [provider]  mupirocin ointment (BACTROBAN) 2 % Apply intranasally to both nostrils twice daily for 5 days 01/05/23     naloxegol oxalate (MOVANTIK) 25 MG TABS tablet Take 1 tablet (25 mg total) by mouth daily as needed. 11/01/22     ondansetron (ZOFRAN-ODT) 8 MG disintegrating  tablet Dissolve 1 tablet (8 mg total) by mouth daily as needed. 10/13/22     ondansetron (ZOFRAN-ODT) 8 MG disintegrating tablet Take 1 tablet (8 mg total) by mouth daily as needed. 02/01/23     Oxycodone HCl 10 MG TABS Take 1 tablet (10 mg total) by mouth 5 (five) times daily as needed (max daily dose 5 tablets) 11/01/22     Oxycodone HCl 10 MG TABS Take 1 tablet (10 mg total) by mouth 5 (five) times daily as needed. (max daily dose: 5 Tablets) 12/01/22     Oxycodone HCl 10 MG TABS Take 1 tablet (10 mg total) by mouth 5 (five) times daily as needed. 01/05/23 12/30/22     Oxycodone HCl 10 MG TABS Take one tablet (10 mg dose) by mouth every 4 (four) hours as needed for Pain for up to 7 days. 01/20/23     Oxycodone HCl 10 MG TABS Take 1 tablet (10 mg total) by mouth 5 (five) times daily as needed. 01/31/23     pantoprazole (PROTONIX) 40 MG tablet Take 1 tablet (40 mg total) by mouth daily as directed 02/01/23     PARoxetine (PAXIL) 10 MG tablet Take 1 tablet (10 mg total) by mouth daily. 10/26/22     promethazine (PHENERGAN) 12.5 MG tablet Take 1 (one) tablet by mouth as needed, max daily dose: 3 Tablets 01/26/23     tiZANidine (ZANAFLEX) 4 MG tablet Take 1-2 tablets (4-8 mg total) by mouth every 8 (eight) hours as needed FOR MUSCLE SPASM (NEED OFFICE VISIT) 01/31/23   Anson Fret, MD      Allergies    Beta adrenergic blockers, Nsaids, and Tolmetin    Review of Systems   Review of Systems Negative except as per HPI Physical Exam Updated Vital Signs BP 114/71 (BP Location: Right Arm)   Pulse 90   Temp 98.1 F (36.7 C) (Oral)   Resp 18   SpO2 99%  Physical Exam Vitals and nursing note reviewed.  Constitutional:      General: She is not in acute distress.    Appearance: She is well-developed. She is not diaphoretic.  HENT:     Head: Normocephalic and atraumatic.  Cardiovascular:     Pulses: Normal pulses.  Pulmonary:     Effort: Pulmonary effort is normal.  Abdominal:     Palpations:  Abdomen is soft.     Tenderness: There is no abdominal tenderness.  Musculoskeletal:        General: Swelling and tenderness present. No deformity.     Right lower leg: No edema.     Left lower leg: No edema.     Right ankle: Tenderness present over the lateral malleolus. No medial malleolus, base of 5th metatarsal or  proximal fibula tenderness. Decreased range of motion.     Left ankle: Normal.     Comments: Calves soft and non tender   Skin:    General: Skin is warm and dry.     Findings: No erythema or rash.  Neurological:     Mental Status: She is alert and oriented to person, place, and time.     Sensory: No sensory deficit.  Psychiatric:        Behavior: Behavior normal.     ED Results / Procedures / Treatments   Labs (all labs ordered are listed, but only abnormal results are displayed) Labs Reviewed  LIPASE, BLOOD - Abnormal; Notable for the following components:      Result Value   Lipase 66 (*)    All other components within normal limits  COMPREHENSIVE METABOLIC PANEL - Abnormal; Notable for the following components:   Sodium 134 (*)    Glucose, Bld 105 (*)    Calcium 8.0 (*)    Albumin 3.2 (*)    Total Bilirubin 0.2 (*)    All other components within normal limits  CBC - Abnormal; Notable for the following components:   Hemoglobin 10.4 (*)    HCT 34.2 (*)    MCH 25.4 (*)    RDW 19.9 (*)    All other components within normal limits  URINALYSIS, ROUTINE W REFLEX MICROSCOPIC - Abnormal; Notable for the following components:   Leukocytes,Ua MODERATE (*)    All other components within normal limits  HCG, SERUM, QUALITATIVE    EKG None  Radiology DG Ankle Right Port  Result Date: 02/25/2023 CLINICAL DATA:  Right ankle pain following fall EXAM: PORTABLE RIGHT ANKLE - 2 VIEW COMPARISON:  None Available. FINDINGS: There is no evidence of fracture, dislocation, or joint effusion. Small superior and plantar calcaneal spurs. There is no evidence of arthropathy or  other focal bone abnormality. Soft tissues are unremarkable. IMPRESSION: Negative. Electronically Signed   By: Helyn Numbers M.D.   On: 02/25/2023 20:50    Procedures Procedures    Medications Ordered in ED Medications  ondansetron (ZOFRAN-ODT) disintegrating tablet 4 mg (4 mg Oral Given 02/25/23 1936)  acetaminophen (TYLENOL) tablet 650 mg (650 mg Oral Given 02/26/23 0206)    ED Course/ Medical Decision Making/ A&P                                 Medical Decision Making Amount and/or Complexity of Data Reviewed Labs: ordered. Radiology: ordered.  Risk Prescription drug management.   This patient presents to the ED for concern of n/v, right ankle injury, this involves an extensive number of treatment options, and is a complaint that carries with it a high risk of complications and morbidity.  The differential diagnosis includes but not limited to sprain vs fracture, gastritis, PUD, metabolic/electrolyte disturbance   Co morbidities that complicate the patient evaluation  As noted above in HPI   Additional history obtained:  Additional history obtained from father at bedside who contributes to history as above External records from outside source obtained and reviewed including visit to [facility] ER for right ankle injury, XR negative for fx   Lab Tests:  I Ordered, and personally interpreted labs.  The pertinent results include:  hcg negative, UA with moderate leukocytes with non bacteria, no urinary symptoms. CMP without significant findings. CBC with hgb 10, gradual down trend. States no menstrual cycles, recommend see PCP to  discuss, was admitted earlier this year for hgb 6 with transfusion. Lipase 66, elevated although not enough for pancreatitis    Imaging Studies ordered:  I ordered imaging studies including XR right ankle  I independently visualized and interpreted imaging which showed no acute bony abnormality  I agree with the radiologist  interpretation   Problem List / ED Course / Critical interventions / Medication management  47 year old female with complaint of vomiting after eating meals, not controlled with at home zofran and phenergan. Also right ankle injury with on going pain and swelling. Regarding the right ankle- XR negative at [facility], negative today, no calf tenderness, recommend wear boot, use crutches to WBAT. Follow up with ortho, patient has an ortho she plans to see. Regarding vomiting, labs today reassuring, imaging from 10/23-24 reviewed, no acute/concerning findings. Continue of protonix, bland diet, see GI. Vomiting currently controlled with zofran dose in ER. Recheck with PCP, return as needed.  I ordered medication including zofran  for nausea/vomiting  Reevaluation of the patient after these medicines showed that the patient resolved I have reviewed the patients home medicines and have made adjustments as needed   Social Determinants of Health:  Has PCP   Test / Admission - Considered:  Stable for dc with plan for follow up         Final Clinical Impression(s) / ED Diagnoses Final diagnoses:  Nausea and vomiting, unspecified vomiting type  Sprain of right ankle, unspecified ligament, initial encounter    Rx / DC Orders ED Discharge Orders     None         Alden Hipp 02/26/23 0216    Tilden Fossa, MD 02/26/23 [phone]

## 2023-03-02 ENCOUNTER — Other Ambulatory Visit (HOSPITAL_COMMUNITY): Payer: Self-pay

## 2023-03-03 ENCOUNTER — Other Ambulatory Visit (HOSPITAL_COMMUNITY): Payer: Self-pay

## 2023-03-03 ENCOUNTER — Other Ambulatory Visit: Payer: Self-pay

## 2023-03-03 MED ORDER — GABAPENTIN 300 MG/6ML PO SOLN
900.0000 mg | Freq: Three times a day (TID) | ORAL | 0 refills | Status: DC
  Filled 2023-03-03: qty 1620, 30d supply, fill #0

## 2023-03-03 MED ORDER — OXYCODONE HCL 10 MG PO TABS
10.0000 mg | ORAL_TABLET | Freq: Every day | ORAL | 0 refills | Status: DC | PRN
  Filled 2023-03-03: qty 150, 30d supply, fill #0

## 2023-03-08 ENCOUNTER — Other Ambulatory Visit (HOSPITAL_COMMUNITY): Payer: Self-pay

## 2023-03-09 ENCOUNTER — Other Ambulatory Visit: Payer: Self-pay

## 2023-03-09 ENCOUNTER — Other Ambulatory Visit: Payer: Self-pay | Admitting: Neurology

## 2023-03-09 ENCOUNTER — Other Ambulatory Visit (HOSPITAL_COMMUNITY): Payer: Self-pay

## 2023-03-09 MED ORDER — EMGALITY 120 MG/ML ~~LOC~~ SOAJ
120.0000 mg | SUBCUTANEOUS | 2 refills | Status: DC
  Filled 2023-03-09: qty 1, 30d supply, fill #0
  Filled 2023-04-01: qty 1, 30d supply, fill #1
  Filled 2023-04-27: qty 1, 30d supply, fill #2

## 2023-03-10 ENCOUNTER — Other Ambulatory Visit (HOSPITAL_COMMUNITY): Payer: Self-pay

## 2023-03-10 MED ORDER — PROMETHAZINE HCL 12.5 MG PO TABS
12.5000 mg | ORAL_TABLET | Freq: Three times a day (TID) | ORAL | 0 refills | Status: DC | PRN
  Filled 2023-03-10: qty 15, 5d supply, fill #0

## 2023-03-10 NOTE — Telephone Encounter (Signed)
Sent MyChart message asking pt for [facility] card so I can complete new Botox auth.

## 2023-03-10 NOTE — Telephone Encounter (Signed)
Submitted auth request via CMM, status is pending. Key: [id number]

## 2023-03-11 ENCOUNTER — Other Ambulatory Visit (HOSPITAL_COMMUNITY): Payer: Self-pay

## 2023-03-16 ENCOUNTER — Other Ambulatory Visit (HOSPITAL_COMMUNITY): Payer: Self-pay

## 2023-03-17 ENCOUNTER — Other Ambulatory Visit (HOSPITAL_COMMUNITY): Payer: Self-pay

## 2023-03-17 MED ORDER — ALBUTEROL SULFATE HFA 108 (90 BASE) MCG/ACT IN AERS
2.0000 | INHALATION_SPRAY | Freq: Four times a day (QID) | RESPIRATORY_TRACT | 1 refills | Status: DC | PRN
  Filled 2023-03-17: qty 18, 25d supply, fill #0
  Filled 2023-04-05: qty 18, 25d supply, fill #1

## 2023-03-22 ENCOUNTER — Other Ambulatory Visit (HOSPITAL_COMMUNITY): Payer: Self-pay

## 2023-03-22 NOTE — Telephone Encounter (Signed)
Received fax that Cindy Hoffman was approved.  Auth#: [id number] (04/03/23-05/02/24)

## 2023-03-26 ENCOUNTER — Other Ambulatory Visit (HOSPITAL_COMMUNITY): Payer: Self-pay

## 2023-03-28 ENCOUNTER — Other Ambulatory Visit (HOSPITAL_COMMUNITY): Payer: Self-pay

## 2023-03-28 ENCOUNTER — Other Ambulatory Visit: Payer: Self-pay

## 2023-03-30 ENCOUNTER — Other Ambulatory Visit (HOSPITAL_COMMUNITY): Payer: Self-pay

## 2023-03-30 MED ORDER — PROMETHAZINE HCL 12.5 MG PO TABS
12.5000 mg | ORAL_TABLET | Freq: Every day | ORAL | 0 refills | Status: DC | PRN
  Filled 2023-03-30: qty 15, 5d supply, fill #0

## 2023-04-01 ENCOUNTER — Other Ambulatory Visit (HOSPITAL_COMMUNITY): Payer: Self-pay

## 2023-04-01 MED ORDER — OXYCODONE HCL 10 MG PO TABS
10.0000 mg | ORAL_TABLET | Freq: Every day | ORAL | 0 refills | Status: DC | PRN
  Filled 2023-04-02: qty 150, 30d supply, fill #0
  Filled ????-??-??: fill #0

## 2023-04-01 MED ORDER — GABAPENTIN 300 MG/6ML PO SOLN
900.0000 mg | Freq: Three times a day (TID) | ORAL | 0 refills | Status: DC
  Filled 2023-04-01: qty 1620, 30d supply, fill #0

## 2023-04-02 ENCOUNTER — Other Ambulatory Visit (HOSPITAL_COMMUNITY): Payer: Self-pay

## 2023-04-04 ENCOUNTER — Other Ambulatory Visit (HOSPITAL_COMMUNITY): Payer: Self-pay

## 2023-04-05 ENCOUNTER — Other Ambulatory Visit (HOSPITAL_COMMUNITY): Payer: Self-pay

## 2023-04-07 ENCOUNTER — Other Ambulatory Visit (HOSPITAL_COMMUNITY): Payer: Self-pay

## 2023-04-27 ENCOUNTER — Other Ambulatory Visit (HOSPITAL_COMMUNITY): Payer: Self-pay

## 2023-04-28 ENCOUNTER — Other Ambulatory Visit: Payer: Self-pay

## 2023-04-28 ENCOUNTER — Other Ambulatory Visit (HOSPITAL_COMMUNITY): Payer: Self-pay

## 2023-04-29 ENCOUNTER — Other Ambulatory Visit: Payer: Self-pay

## 2023-04-29 ENCOUNTER — Other Ambulatory Visit (HOSPITAL_COMMUNITY): Payer: Self-pay

## 2023-05-01 ENCOUNTER — Other Ambulatory Visit (HOSPITAL_COMMUNITY): Payer: Self-pay

## 2023-05-01 MED ORDER — ALBUTEROL SULFATE HFA 108 (90 BASE) MCG/ACT IN AERS
2.0000 | INHALATION_SPRAY | Freq: Four times a day (QID) | RESPIRATORY_TRACT | 1 refills | Status: DC | PRN
  Filled 2023-05-01: qty 6.7, 17d supply, fill #0

## 2023-05-02 ENCOUNTER — Telehealth: Payer: Self-pay | Admitting: Neurology

## 2023-05-02 ENCOUNTER — Other Ambulatory Visit (HOSPITAL_COMMUNITY): Payer: Self-pay

## 2023-05-02 NOTE — Telephone Encounter (Signed)
Patient called phone room and LVM asking about making a Botox appt. VM was forwarded to my phone. Pt states she will have new insurance on 05/04/23. I sent MyChart msg asking pt for a copy of her new card.

## 2023-05-03 ENCOUNTER — Other Ambulatory Visit (HOSPITAL_COMMUNITY): Payer: Self-pay

## 2023-05-03 MED ORDER — OXYCODONE HCL 10 MG PO TABS
10.0000 mg | ORAL_TABLET | Freq: Every day | ORAL | 0 refills | Status: DC
  Filled 2023-05-03: qty 150, 30d supply, fill #0

## 2023-05-05 ENCOUNTER — Other Ambulatory Visit (HOSPITAL_COMMUNITY): Payer: Self-pay

## 2023-05-05 MED ORDER — ONABOTULINUMTOXINA 200 UNITS IJ SOLR: INTRAMUSCULAR | 3 refills | Status: DC

## 2023-05-05 MED ORDER — GABAPENTIN 300 MG/6ML PO SOLN
900.0000 mg | Freq: Three times a day (TID) | ORAL | 0 refills | Status: DC
  Filled 2023-05-05: qty 1620, 30d supply, fill #0

## 2023-05-05 NOTE — Telephone Encounter (Signed)
 Submitted auth request via [employer] portal, received approval. Pt will fill through [facility]. Auth#: [id number] (05/05/23-05/04/24)  Dr. Ines- pt reached out asking to restart Botox , her insurance approved. You haven't seen her since 01/2022. Do you want to see her before scheduling Botox  or should I go ahead and schedule her? She has an OV with you scheduled in April.

## 2023-05-05 NOTE — Addendum Note (Signed)
 Addended by: Guy Begin on: 05/05/2023 04:51 PM   Modules accepted: Orders

## 2023-05-09 ENCOUNTER — Telehealth: Payer: Self-pay | Admitting: Neurology

## 2023-05-09 ENCOUNTER — Other Ambulatory Visit (HOSPITAL_COMMUNITY): Payer: Self-pay

## 2023-05-09 NOTE — Telephone Encounter (Signed)
 [facility] schedule delivery of Botox on 05/11/23. Will require a signature at delivery

## 2023-05-20 ENCOUNTER — Other Ambulatory Visit: Payer: Self-pay

## 2023-05-20 ENCOUNTER — Other Ambulatory Visit (HOSPITAL_COMMUNITY): Payer: Self-pay

## 2023-05-22 ENCOUNTER — Other Ambulatory Visit: Payer: Self-pay | Admitting: Hematology and Oncology

## 2023-05-22 DIAGNOSIS — D500 Iron deficiency anemia secondary to blood loss (chronic): Secondary | ICD-10-CM

## 2023-05-23 ENCOUNTER — Inpatient Hospital Stay: Payer: 59 | Attending: Nurse Practitioner

## 2023-05-23 DIAGNOSIS — Z9884 Bariatric surgery status: Secondary | ICD-10-CM | POA: Insufficient documentation

## 2023-05-23 DIAGNOSIS — D508 Other iron deficiency anemias: Secondary | ICD-10-CM | POA: Insufficient documentation

## 2023-05-23 DIAGNOSIS — D500 Iron deficiency anemia secondary to blood loss (chronic): Secondary | ICD-10-CM

## 2023-05-23 LAB — CMP (CANCER CENTER ONLY)
ALT: 16 U/L (ref 0–44)
AST: 19 U/L (ref 15–41)
Albumin: 4 g/dL (ref 3.5–5.0)
Alkaline Phosphatase: 131 U/L — ABNORMAL HIGH (ref 38–126)
Anion gap: 7 (ref 5–15)
BUN: 10 mg/dL (ref 6–20)
CO2: 27 mmol/L (ref 22–32)
Calcium: 9.1 mg/dL (ref 8.9–10.3)
Chloride: 103 mmol/L (ref 98–111)
Creatinine: 0.53 mg/dL (ref 0.44–1.00)
GFR, Estimated: >60 mL/min (ref >60)
Glucose, Bld: 92 mg/dL (ref 70–99)
Potassium: 4 mmol/L (ref 3.5–5.1)
Sodium: 137 mmol/L (ref 135–145)
Total Bilirubin: 0.3 mg/dL (ref 0.0–1.2)
Total Protein: 7.2 g/dL (ref 6.5–8.1)

## 2023-05-23 LAB — CBC WITH DIFFERENTIAL (CANCER CENTER ONLY)
Abs Immature Granulocytes: 0.02 10*3/uL (ref 0.00–0.07)
Basophils Absolute: 0.1 10*3/uL (ref 0.0–0.1)
Basophils Relative: 1 %
Eosinophils Absolute: 0.2 10*3/uL (ref 0.0–0.5)
Eosinophils Relative: 3 %
HCT: 37.2 % (ref 36.0–46.0)
Hemoglobin: 11.2 g/dL — ABNORMAL LOW (ref 12.0–15.0)
Immature Granulocytes: 0 %
Lymphocytes Relative: 29 %
Lymphs Abs: 2.3 10*3/uL (ref 0.7–4.0)
MCH: 22.9 pg — ABNORMAL LOW (ref 26.0–34.0)
MCHC: 30.1 g/dL (ref 30.0–36.0)
MCV: 76.1 fL — ABNORMAL LOW (ref 80.0–100.0)
Monocytes Absolute: 0.5 10*3/uL (ref 0.1–1.0)
Monocytes Relative: 6 %
Neutro Abs: 4.7 10*3/uL (ref 1.7–7.7)
Neutrophils Relative %: 61 %
Platelet Count: 376 10*3/uL (ref 150–400)
RBC: 4.89 MIL/uL (ref 3.87–5.11)
RDW: 24.8 % — ABNORMAL HIGH (ref 11.5–15.5)
WBC Count: 7.7 10*3/uL (ref 4.0–10.5)
nRBC: 0 % (ref 0.0–0.2)

## 2023-05-23 LAB — RETIC PANEL
Immature Retic Fract: 26.3 % — ABNORMAL HIGH (ref 2.3–15.9)
RBC.: 4.97 MIL/uL (ref 3.87–5.11)
Retic Count, Absolute: 87.5 10*3/uL (ref 19.0–186.0)
Retic Ct Pct: 1.8 % (ref 0.4–3.1)
Reticulocyte Hemoglobin: 30.6 pg (ref >27.9)

## 2023-05-23 LAB — VITAMIN B12: Vitamin B-12: 271 pg/mL (ref 180–914)

## 2023-05-23 LAB — IRON AND IRON BINDING CAPACITY (CC-WL,HP ONLY)
Iron: 28 ug/dL (ref 28–170)
Saturation Ratios: 5 % — ABNORMAL LOW (ref 10.4–31.8)
TIBC: 525 ug/dL — ABNORMAL HIGH (ref 250–450)
UIBC: 497 ug/dL — ABNORMAL HIGH (ref 148–442)

## 2023-05-23 LAB — FERRITIN: Ferritin: 16 ng/mL (ref 11–307)

## 2023-05-25 ENCOUNTER — Other Ambulatory Visit (HOSPITAL_COMMUNITY): Payer: Self-pay

## 2023-05-29 DIAGNOSIS — D500 Iron deficiency anemia secondary to blood loss (chronic): Secondary | ICD-10-CM | POA: Insufficient documentation

## 2023-05-29 NOTE — Assessment & Plan Note (Deleted)
#   Iron Deficiency Anemia sp Bariatric Surgery 2018 04/28/2022: WBC 6.6, Hgb 8.4, MCV 68.1, Plt 551 07/17/2022: WBC 5.1, Hgb 6.1, MCV 64.4, Plt 359. Received 1 dose of feraheme 09/30/2022: WBC 6.6, Hgb 14.1, MCV 81.8, Plt 256 11/29/2022: establish care with Dr. Leonides Schanz 05/23/2023 -WBC 7.7, hemoglobin 11.2, MCV 76.1, PLT 376, iron 28, TIBC 525, sat ratio 5%, UIBC 47, B12- 271, immature reticulocyte Frac 26.2.

## 2023-05-29 NOTE — Progress Notes (Deleted)
Patient Care Team: Cindy Hoffman as PCP - General (Physician Assistant)  Clinic Day:  05/29/2023  Referring physician: Jamey Hoffman, Cindy Hoffman*  ASSESSMENT & PLAN:   Assessment & Plan: Iron deficiency anemia due to chronic blood loss # Iron Deficiency Anemia sp Bariatric Surgery 2018 04/28/2022: WBC 6.6, Hgb 8.4, MCV 68.1, Plt 551 07/17/2022: WBC 5.1, Hgb 6.1, MCV 64.4, Plt 359. Received 1 dose of feraheme 09/30/2022: WBC 6.6, Hgb 14.1, MCV 81.8, Plt 256 11/29/2022: establish care with Dr. Leonides Hoffman 05/23/2023 -WBC 7.7, hemoglobin 11.2, MCV 76.1, PLT 376, iron 28, TIBC 525, sat ratio 5%, UIBC 47, B12- 271, immature reticulocyte Frac 26.2.   Plan: Labs reviewed  -CBC showing WBC 7.7; Hgb 11.2; Hct 37.2; MCV 76.1; plt 376 -CMP - K 4.0; glucose 92; BUN 10; Creatinine 0.53; eGFR >60; Ca 9.1; LFTs normal.   -Iron 28; TIBC 525;Sat ratio 5%; UIBC 497; B12 level 271; immature reticulocyte fraction 26.2  The patient understands the plans discussed today and is in agreement with them.  She knows to contact our office if she develops concerns prior to her next appointment.  I provided *** minutes of face-to-face time during this encounter and > 50% was spent counseling as documented under my assessment and plan.    Cindy Jews, NP  [facility] - A DEPT OF [facility] [address] Cindy Hoffman [id number] Dept: [phone] Dept Fax: [phone]   No orders of the defined types were placed in this encounter.     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  Last saw Dr. Leonides Hoffman on 11/29/2022.  She denies fevers or chills. She denies pain. Her appetite is good. Her weight {Weight change:10426}.  I have reviewed the past medical history, past surgical history, social history  and family history with the patient and they are unchanged from previous note.  ALLERGIES:  is allergic to beta adrenergic blockers, nsaids, and tolmetin.  MEDICATIONS:  Current Outpatient Medications  Medication Sig Dispense Refill   acetaminophen (TYLENOL) 500 MG tablet Take two tablets (1,000 mg dose) by mouth every 8 (eight) hours for 10 days. 30 tablet 0   albuterol (PROVENTIL HFA;VENTOLIN HFA) 108 (90 BASE) MCG/ACT inhaler Inhale 2 puffs into the lungs every 6 (six) hours as needed for wheezing or shortness of breath.      albuterol (PROVENTIL) (2.5 MG/3ML) 0.083% nebulizer solution Take 2.5 mg by nebulization every 6 (six) hours as needed for wheezing or shortness of breath.     albuterol (VENTOLIN HFA) 108 (90 Base) MCG/ACT inhaler Inhale 2 puffs into the lungs 4 (four) times daily as needed for shortness of breath or wheezing. 6.7 g 1   albuterol (VENTOLIN HFA) 108 (90 Base) MCG/ACT inhaler Inhale 2 puffs into the lungs as needed. 6.7 g 0   amitriptyline (ELAVIL) 100 MG tablet Take 1 tablet (100 mg total) by mouth at bedtime as needed. 90 tablet 1   amitriptyline (ELAVIL) 100 MG tablet Take 1 tablet (100 mg total) by mouth at bedtime as needed. 90 tablet 1   baclofen (LIORESAL) 10 MG tablet Take 1 tablet (10 mg total) by mouth daily as needed. 30 tablet 0   baclofen (LIORESAL) 10 MG tablet Take 1 tablet (10 mg) by mouth as needed. 30 tablet 0   baclofen (LIORESAL) 10 MG  tablet Take 1 tablet (10 mg total) by mouth as needed. 30 tablet 0   botulinum toxin Type A (BOTOX) 200 units injection Inject 155 units into head and neck musclesevery 3 months.  Discard the remainder. 1 each 3   EPINEPHrine (EPIPEN 2-PAK) 0.3 mg/0.3 mL IJ SOAJ injection Inject into the muscle every 5 to 15 minutes as needed for hypersensitivity reaction; do not exceed 3 doses per episode 2 each 4   Ferrous Sulfate (IRON) 325 (65 Fe) MG TABS Take 1 tablet (325 mg total) by mouth daily. 90 tablet 1   furosemide (LASIX) 20  MG tablet Take 1 tablet (20 mg total) by mouth daily. 90 tablet 1   gabapentin (NEURONTIN) 300 MG/6ML solution Take 18 mLs (900 mg total) by mouth 3 times daily. 1620 mL 0   gabapentin (NEURONTIN) 300 MG/6ML solution Take 18 mls by mouth 3 times a day 1620 mL 0   gabapentin (NEURONTIN) 300 MG/6ML solution Take 18 mLs (900 mg total) by mouth 3 (three) times daily. 1620 mL 0   gabapentin (NEURONTIN) 300 MG/6ML solution Take 18 mLs (900 mg total) by mouth 3 (three) times daily. 1620 mL 0   gabapentin (NEURONTIN) 300 MG/6ML solution Take 18 mLs (900 mg total) by mouth 3 (three) times daily. 1620 mL 0   Galcanezumab-gnlm (EMGALITY) 120 MG/ML SOAJ Inject 120 mg into the skin every 30 (thirty) days. 1 mL 2   methylPREDNISolone (MEDROL DOSEPAK) 4 MG TBPK tablet Follow package directions 21 tablet 0   montelukast (SINGULAIR) 10 MG tablet Take 1 tablet (10 mg total) by mouth every evening. 90 tablet 0   montelukast (SINGULAIR) 10 MG tablet Take 1 tablet (10 mg total) by mouth every evening. 90 tablet 0   montelukast (SINGULAIR) 10 MG tablet Take 1 tablet (10 mg total) by mouth every evening. 90 tablet 0   MOVANTIK 25 MG TABS tablet Take 25 mg by mouth at bedtime.     mupirocin ointment (BACTROBAN) 2 % Apply intranasally to both nostrils twice daily for 5 days 22 g 0   naloxegol oxalate (MOVANTIK) 25 MG TABS tablet Take 1 tablet (25 mg total) by mouth daily as needed. 30 tablet 0   ondansetron (ZOFRAN-ODT) 8 MG disintegrating tablet Dissolve 1 tablet (8 mg total) by mouth daily as needed. 30 tablet 0   ondansetron (ZOFRAN-ODT) 8 MG disintegrating tablet Take 1 tablet (8 mg total) by mouth daily as needed. 30 tablet 0   Oxycodone HCl 10 MG TABS Take 1 tablet (10 mg total) by mouth 5 (five) times daily as needed (max daily dose 5 tablets) 150 tablet 0   Oxycodone HCl 10 MG TABS Take 1 tablet (10 mg total) by mouth 5 (five) times daily as needed. (max daily dose: 5 Tablets) 150 tablet 0   Oxycodone HCl 10 MG  TABS Take 1 tablet (10 mg total) by mouth 5 (five) times daily as needed. 01/05/23 150 tablet 0   Oxycodone HCl 10 MG TABS Take one tablet (10 mg dose) by mouth every 4 (four) hours as needed for Pain for up to 7 days. 42 tablet 0   Oxycodone HCl 10 MG TABS Take 1 tablet (10 mg total) by mouth 5 (five) times daily as needed. 150 tablet 0   Oxycodone HCl 10 MG TABS Take 1 tablet (10 mg total) by mouth 5 (five) times daily as needed. 150 tablet 0   Oxycodone HCl 10 MG TABS Take 1 tablet (10 mg total) by mouth  5 (five) times daily as needed. 150 tablet 0   Oxycodone HCl 10 MG TABS Take 1 tablet (10 mg total) by mouth 5 (five) times daily as needed max daily dose 5 tablets 150 tablet 0   pantoprazole (PROTONIX) 40 MG tablet Take 1 tablet (40 mg total) by mouth daily as directed 90 tablet 1   PARoxetine (PAXIL) 10 MG tablet Take 1 tablet (10 mg total) by mouth daily. 30 tablet 3   promethazine (PHENERGAN) 12.5 MG tablet Take 1 tablet (12.5 mg total) by mouth 3 (three) times daily as needed. Max daily dose 3 tablets 15 tablet 0   promethazine (PHENERGAN) 12.5 MG tablet Take 1 tablet (12.5 mg total) by mouth daily as needed. Max daily dose: 3 Tablets 15 tablet 0   tiZANidine (ZANAFLEX) 4 MG tablet Take 1-2 tablets (4-8 mg total) by mouth every 8 (eight) hours as needed FOR MUSCLE SPASM (NEED OFFICE VISIT) 360 tablet 1   No current facility-administered medications for this visit.    HISTORY OF PRESENT ILLNESS:   Oncology History   No history exists.      REVIEW OF SYSTEMS:   Constitutional: Denies fevers, chills or abnormal weight loss Eyes: Denies blurriness of vision Ears, nose, mouth, throat, and face: Denies mucositis or sore throat Respiratory: Denies cough, dyspnea or wheezes Cardiovascular: Denies palpitation, chest discomfort or lower extremity swelling Gastrointestinal:  Denies nausea, heartburn or change in bowel habits Skin: Denies abnormal skin rashes Lymphatics: Denies new  lymphadenopathy or easy bruising Neurological:Denies numbness, tingling or new weaknesses Behavioral/Psych: Mood is stable, no new changes  All other systems were reviewed with the patient and are negative.   VITALS:  There were no vitals taken for this visit.  Wt Readings from Last 3 Encounters:  02/23/23 189 lb 13.1 oz (86.1 kg)  11/29/22 189 lb 14.4 oz (86.1 kg)  09/30/22 180 lb 12.4 oz (82 kg)    There is no height or weight on file to calculate BMI.  Performance status (ECOG): {CHL ONC [id number]  PHYSICAL EXAM:   GENERAL:alert, no distress and comfortable SKIN: skin color, texture, turgor are normal, no rashes or significant lesions EYES: normal, Conjunctiva are pink and non-injected, sclera clear OROPHARYNX:no exudate, no erythema and lips, buccal mucosa, and tongue normal  NECK: supple, thyroid normal size, non-tender, without nodularity LYMPH:  no palpable lymphadenopathy in the cervical, axillary or inguinal LUNGS: clear to auscultation and percussion with normal breathing effort HEART: regular rate & rhythm and no murmurs and no lower extremity edema ABDOMEN:abdomen soft, non-tender and normal bowel sounds Musculoskeletal:no cyanosis of digits and no clubbing  NEURO: alert & oriented x 3 with fluent speech, no focal motor/sensory deficits  LABORATORY DATA:  I have reviewed the data as listed    Component Value Date/Time   NA 137 05/23/2023 1419   K 4.0 05/23/2023 1419   CL 103 05/23/2023 1419   CO2 27 05/23/2023 1419   GLUCOSE 92 05/23/2023 1419   BUN 10 05/23/2023 1419   CREATININE 0.53 05/23/2023 1419   CALCIUM 9.1 05/23/2023 1419   PROT 7.2 05/23/2023 1419   ALBUMIN 4.0 05/23/2023 1419   ALBUMIN 3.3 (L) 09/02/2020 1743   AST 19 05/23/2023 1419   ALT 16 05/23/2023 1419   ALKPHOS 131 (H) 05/23/2023 1419   BILITOT 0.3 05/23/2023 1419   GFRNONAA >60 05/23/2023 1419   GFRAA >60 02/04/2020 1659    No results found for: "SPEP", "UPEP"  Lab Results   Component Value Date  WBC 7.7 05/23/2023   NEUTROABS 4.7 05/23/2023   HGB 11.2 (L) 05/23/2023   HCT 37.2 05/23/2023   MCV 76.1 (L) 05/23/2023   PLT 376 05/23/2023      Chemistry      Component Value Date/Time   NA 137 05/23/2023 1419   K 4.0 05/23/2023 1419   CL 103 05/23/2023 1419   CO2 27 05/23/2023 1419   BUN 10 05/23/2023 1419   CREATININE 0.53 05/23/2023 1419      Component Value Date/Time   CALCIUM 9.1 05/23/2023 1419   ALKPHOS 131 (H) 05/23/2023 1419   AST 19 05/23/2023 1419   ALT 16 05/23/2023 1419   BILITOT 0.3 05/23/2023 1419       RADIOGRAPHIC STUDIES: I have personally reviewed the radiological images as listed and agreed with the findings in the report. No results found.

## 2023-05-30 ENCOUNTER — Telehealth: Payer: Self-pay | Admitting: Pharmacy Technician

## 2023-05-30 ENCOUNTER — Other Ambulatory Visit: Payer: Self-pay | Admitting: Hematology and Oncology

## 2023-05-30 ENCOUNTER — Ambulatory Visit: Payer: Medicare HMO | Admitting: Nurse Practitioner

## 2023-05-30 DIAGNOSIS — D500 Iron deficiency anemia secondary to blood loss (chronic): Secondary | ICD-10-CM

## 2023-05-30 NOTE — Telephone Encounter (Signed)
Auth Submission: NO AUTH NEEDED Site of care: Site of care: [facility] Payer: [employer] MEDICARE Medication & CPT/J Code(s) submitted: Feraheme (ferumoxytol) [id number] Route of submission (phone, fax, portal):  Phone # Fax # Auth type: Buy/Bill PB Units/visits requested: 2 DOSES Reference number: [id number] Approval from: 05/30/23 to 10/28/23

## 2023-06-03 ENCOUNTER — Telehealth: Payer: Self-pay | Admitting: *Deleted

## 2023-06-03 NOTE — Telephone Encounter (Signed)
TCT patient to provide test results and Dr. Derek Mound treatment recommendation. Left general voice mail for patient to contact office for results.

## 2023-06-04 DEATH — deceased

## 2023-06-06 ENCOUNTER — Telehealth: Payer: Self-pay | Admitting: *Deleted

## 2023-06-06 ENCOUNTER — Other Ambulatory Visit (HOSPITAL_COMMUNITY): Payer: Self-pay

## 2023-06-06 NOTE — Telephone Encounter (Signed)
-----   Message from Nurse Lorra Hals sent at 06/03/2023  5:40 PM EST ----- Regarding: No answer - Please call  ----- Message ----- From: Tonny Bollman, RN Sent: 06/03/2023   5:26 PM EST To: [facility] Subject: No answer                                      No answer - LVM for her to call office to discuss test results and that we would also try to reach her. ----- Message ----- From: Kyra Searles, RN Sent: 05/30/2023   4:53 PM EST To: Tonny Bollman, RN   ----- Message ----- From: Jaci Standard, MD Sent: 05/30/2023  12:04 PM EST To: Kyra Searles, RN  Please let Mrs. Hedman know that her labs are consistent with iron deficiency. We will order IV iron to be administered ASAP. Please schedule her a follow up visit in 8 weeks with labs 1 week before. ----- Message ----- From: Leory Plowman, [facility] [place] Sent: 05/23/2023   2:46 PM EST To: Jaci Standard, MD

## 2023-06-06 NOTE — Telephone Encounter (Signed)
TCT patient. No answer . Was able to leave vm message for pt to return this call  as soon as she can to [phone] so we can advise her of Dr. Derek Mound recommendations: Pt's labs are consistent with iron deficiency. We will order IV iron to be administered ASAP. Please schedule her a follow up visit in 8 weeks with labs 1 week before.   Scheduling message sent for labs and clinic visit.

## 2023-06-07 ENCOUNTER — Telehealth: Payer: Self-pay | Admitting: Hematology and Oncology

## 2023-06-07 NOTE — Telephone Encounter (Signed)
Scheduled appointments per 2/3 scheduling message. Left the patient a message with the scheduled appointment dates and times. Patient will be mailed an appointment reminder.

## 2023-07-11 NOTE — Telephone Encounter (Signed)
 Received fax from [employer] requesting ins coverage for pt, informed that pt is deceased. Rx for Botox was cancelled.

## 2023-07-13 ENCOUNTER — Other Ambulatory Visit (HOSPITAL_COMMUNITY): Payer: Self-pay

## 2023-07-19 ENCOUNTER — Ambulatory Visit: Payer: 59 | Admitting: Family Medicine

## 2023-07-28 ENCOUNTER — Other Ambulatory Visit: Payer: 59

## 2023-08-04 ENCOUNTER — Ambulatory Visit: Payer: 59 | Admitting: Hematology and Oncology

## 2023-08-06 ENCOUNTER — Other Ambulatory Visit (HOSPITAL_COMMUNITY): Payer: Self-pay

## 2023-08-16 ENCOUNTER — Ambulatory Visit: Payer: Medicare HMO | Admitting: Neurology

## 2023-08-17 ENCOUNTER — Ambulatory Visit: Payer: Medicare HMO | Admitting: Neurology
